# Patient Record
Sex: Female | Born: 1935 | Race: White | Hispanic: No | Marital: Single | Smoking: Former smoker
Health system: Southern US, Community
[De-identification: ages and names within clinical notes are randomized; demographics above are authoritative.]

## PROBLEM LIST (undated history)

## (undated) DIAGNOSIS — D649 Anemia, unspecified: Secondary | ICD-10-CM

## (undated) DIAGNOSIS — J90 Pleural effusion, not elsewhere classified: Secondary | ICD-10-CM

## (undated) DIAGNOSIS — Z8619 Personal history of other infectious and parasitic diseases: Secondary | ICD-10-CM

## (undated) DIAGNOSIS — I340 Nonrheumatic mitral (valve) insufficiency: Secondary | ICD-10-CM

## (undated) DIAGNOSIS — Z7901 Long term (current) use of anticoagulants: Secondary | ICD-10-CM

## (undated) DIAGNOSIS — I071 Rheumatic tricuspid insufficiency: Secondary | ICD-10-CM

## (undated) DIAGNOSIS — I639 Cerebral infarction, unspecified: Secondary | ICD-10-CM

## (undated) DIAGNOSIS — I4819 Other persistent atrial fibrillation: Secondary | ICD-10-CM

## (undated) DIAGNOSIS — I951 Orthostatic hypotension: Secondary | ICD-10-CM

## (undated) DIAGNOSIS — R296 Repeated falls: Secondary | ICD-10-CM

## (undated) DIAGNOSIS — Z66 Do not resuscitate: Secondary | ICD-10-CM

## (undated) DIAGNOSIS — Z8719 Personal history of other diseases of the digestive system: Secondary | ICD-10-CM

## (undated) DIAGNOSIS — I351 Nonrheumatic aortic (valve) insufficiency: Secondary | ICD-10-CM

## (undated) DIAGNOSIS — K529 Noninfective gastroenteritis and colitis, unspecified: Secondary | ICD-10-CM

## (undated) DIAGNOSIS — I495 Sick sinus syndrome: Secondary | ICD-10-CM

## (undated) DIAGNOSIS — I499 Cardiac arrhythmia, unspecified: Secondary | ICD-10-CM

## (undated) HISTORY — DX: Long term (current) use of anticoagulants: Z79.01

## (undated) HISTORY — PX: CHOLECYSTECTOMY: SHX55

## (undated) HISTORY — DX: Personal history of other infectious and parasitic diseases: Z86.19

## (undated) HISTORY — PX: APPENDECTOMY: SHX54

---

## 2000-05-28 ENCOUNTER — Inpatient Hospital Stay (HOSPITAL_COMMUNITY): Admission: EM | Admit: 2000-05-28 | Discharge: 2000-05-29 | Payer: Self-pay | Admitting: Emergency Medicine

## 2000-06-04 ENCOUNTER — Encounter: Admission: RE | Admit: 2000-06-04 | Discharge: 2000-06-04 | Payer: Self-pay | Admitting: Family Medicine

## 2000-08-31 ENCOUNTER — Other Ambulatory Visit: Admission: RE | Admit: 2000-08-31 | Discharge: 2000-08-31 | Payer: Self-pay | Admitting: Family Medicine

## 2000-09-01 ENCOUNTER — Inpatient Hospital Stay (HOSPITAL_COMMUNITY): Admission: EM | Admit: 2000-09-01 | Discharge: 2000-09-12 | Payer: Self-pay | Admitting: Emergency Medicine

## 2000-09-01 ENCOUNTER — Encounter: Payer: Self-pay | Admitting: Surgery

## 2000-09-01 ENCOUNTER — Encounter (INDEPENDENT_AMBULATORY_CARE_PROVIDER_SITE_OTHER): Payer: Self-pay | Admitting: Specialist

## 2000-09-07 ENCOUNTER — Encounter: Payer: Self-pay | Admitting: General Surgery

## 2004-12-14 ENCOUNTER — Ambulatory Visit: Payer: Self-pay | Admitting: Infectious Diseases

## 2004-12-14 ENCOUNTER — Inpatient Hospital Stay (HOSPITAL_COMMUNITY): Admission: EM | Admit: 2004-12-14 | Discharge: 2004-12-16 | Payer: Self-pay | Admitting: Emergency Medicine

## 2004-12-24 ENCOUNTER — Ambulatory Visit: Payer: Self-pay | Admitting: Internal Medicine

## 2005-01-30 ENCOUNTER — Ambulatory Visit: Payer: Self-pay | Admitting: Internal Medicine

## 2007-05-15 ENCOUNTER — Inpatient Hospital Stay (HOSPITAL_COMMUNITY): Admission: EM | Admit: 2007-05-15 | Discharge: 2007-05-17 | Payer: Self-pay | Admitting: Emergency Medicine

## 2010-04-10 ENCOUNTER — Encounter: Admission: RE | Admit: 2010-04-10 | Discharge: 2010-04-10 | Payer: Self-pay | Admitting: Orthopedic Surgery

## 2010-10-16 ENCOUNTER — Ambulatory Visit: Payer: Self-pay | Admitting: Cardiovascular Disease

## 2010-10-30 ENCOUNTER — Ambulatory Visit (HOSPITAL_COMMUNITY)
Admission: RE | Admit: 2010-10-30 | Discharge: 2010-10-30 | Payer: Self-pay | Source: Home / Self Care | Attending: Cardiovascular Disease | Admitting: Cardiovascular Disease

## 2010-10-30 ENCOUNTER — Ambulatory Visit: Payer: Self-pay

## 2010-10-30 ENCOUNTER — Encounter: Payer: Self-pay | Admitting: Cardiovascular Disease

## 2010-12-30 ENCOUNTER — Ambulatory Visit (INDEPENDENT_AMBULATORY_CARE_PROVIDER_SITE_OTHER): Payer: Medicare Other | Admitting: Cardiovascular Disease

## 2010-12-30 DIAGNOSIS — I4891 Unspecified atrial fibrillation: Secondary | ICD-10-CM

## 2011-02-06 ENCOUNTER — Other Ambulatory Visit: Payer: Self-pay | Admitting: *Deleted

## 2011-02-06 DIAGNOSIS — I4891 Unspecified atrial fibrillation: Secondary | ICD-10-CM

## 2011-02-06 MED ORDER — DIGOXIN 250 MCG PO TABS: ORAL_TABLET | ORAL | Status: DC

## 2011-02-06 NOTE — Telephone Encounter (Signed)
REFILL PER FAX FROM PHARMACY  

## 2011-04-01 NOTE — H&P (Signed)
NAMEJERNIE, SCHUTT                   ACCOUNT NO.:  [id number]   MEDICAL RECORD NO.:  [id number]          PATIENT TYPE:  INP   LOCATION:  6708                         FACILITY:  [facility]   PHYSICIAN:  Lonia Blood, M.D.       DATE OF BIRTH:  [birth year]   DATE OF ADMISSION:  05/15/2007  DATE OF DISCHARGE:                              HISTORY & PHYSICAL   PRIMARY CARE PHYSICIAN:  Windle Guard, M.D., [place].   CHIEF COMPLAINT:  Can't walk.   HISTORY OF PRESENT ILLNESS:  Mrs. Londo is an 77 year old woman with past  medical history of atrial fibrillation, who was brought to the emergency  room by her family after she had subacute onset of increased confusion,  dizziness, inability to walk that followed closely ingestion of 1 tablet  of 150 mg of amitriptyline.  The patient has been battling some  neuralgia caused by a zoster outbreak for the past 3 weeks.  Ms. Loflin  vertigo, denies any speech troubles, denies any focal weakness or  numbness.  The patient herself denies any confusion, but the family  assures me that she was thoroughly refused the day prior to admission.  The patient also reports some dizziness with change in position.  The  patient denies any chest pain or shortness of breath.   PAST MEDICAL HISTORY:  1. Atrial fibrillation on chronic Coumadin.  2. Appendectomy.  3. Hemicolectomy  4. Total hysterectomy and bilateral salpingo-oophorectomy.  5. Left ICA stenosis of 40-60%.  6. Hyperlipidemia.  7. Zoster for the past 3 weeks.   HOME MEDICATIONS:  1. Metoprolol 25 mg daily.  2. Digoxin 25 mcg daily.  3. Coumadin.   SOCIAL HISTORY:  The patient lives alone.  She is a former tobacco user  but quit about 3 years ago. She does not drink alcohol, does not use any  illicit drugs.  She is retired.   FAMILY HISTORY:  Positive coronary artery disease in father at an old  age.  Positive for stroke in her mother at an old age.  The patient has  two daughters present in the  room.  They are both healthy.   REVIEW OF SYSTEMS:  As per HPI.  All other systems are negative.   PHYSICAL EXAMINATION:  VITAL SIGNS:  Examination upon admission shows a  temperature of 96.3, blood pressure 111/70, pulse 108, respirations 20s  saturation 97% on room air.  GENERAL APPEARANCE:  A well-developed, well-nourished woman lying on the  stretcher, alert, oriented to place, person and time.  HEENT:  Head:  Normocephalic, atraumatic.  Eyes: Pupils equal, round,  and reactive to light and accommodation.  Extraocular movements intact.  Throat clear.  NECK: Supple.  No JVD or carotid bruits.  There is no facial droop.  CHEST:  Clear to auscultation bilaterally without wheezes, rhonchi, or  crackles.  HEART:  Irregularly irregular without murmurs, rubs or gallops.  ABDOMEN:  Soft, nontender and bowel sounds are present.  EXTREMITIES:  Lower Extremities have no edema.  SKIN: On the left side of the thorax in the T1-T3  distribution, there is  a maculopapular vesicular rash.  NEUROLOGIC:  The cerebellum seems to be intact with finger-to-nose and  heel-to-shin intact.  The patient does have a Romberg sign positive, and  she has significant ataxia when attempting to walk.  There is no  nystagmus.  There are no tremors.  Sensation and strength seem to be  intact in all four extremities.   LABORATORY VALUES:  At the time of admission show a sodium of 135, a  potassium of 4.60, chloride 103, BUN 27, glucose of 115. Hemoglobin 14.  Digoxin 0.5. Creatinine of 1.1. Urinalysis positive for nitrite.   Computed tomography scan of the head is without any acute changes.   EKG does not show QT prolongation.   ASSESSMENT/PLAN:  Ataxia.  Symptoms could well be attributed to her  amitriptyline ingestion, but given the presence of atrial fibrillation,  chronic anticoagulation, multiple risk factor for strokes, we would  proceed with ruling out an acute stroke by MRI of the brain.  If this   patient has an acute stroke, I would suspect a cerebellar location or a  brainstem location.  The patient will be placed on telemetry in a  neurological unit. She will receive the neurological checks and  monitoring for 24 hours.  The patient's metoprolol and digoxin for  atrial fibrillation rate control will be continued.  Physical therapy  and occupational therapy will be ordered.  Further plans in regard to  the patient's treatment will be dictated by the findings of the MRI of  the brain.  For now, we will discontinue amitriptyline, and the patient  has refused any alternatives for her post herpetic neuralgia.      Lonia Blood, M.D.  Electronically Signed     SL/MEDQ  D:  05/15/2007  T:  05/16/2007  Job:  [id number]   cc:   Windle Guard, M.D.

## 2011-04-01 NOTE — Discharge Summary (Signed)
Carol Snyder, Carol Snyder                   ACCOUNT NO.:  [id number]   MEDICAL RECORD NO.:  [id number]          PATIENT TYPE:  INP   LOCATION:  6708                         FACILITY:  [facility]   PHYSICIAN:  Ladell Pier, M.D.   DATE OF BIRTH:  April 18, 1929   DATE OF ADMISSION:  05/15/2007  DATE OF DISCHARGE:  05/17/2007                               DISCHARGE SUMMARY   DISCHARGE DIAGNOSIS:  1. Ataxia dizziness secondary to high dose of amitriptyline.  2. Atrial fibrillation on chronic Coumadin therapy.  3. Status post appendectomy.  4. Status post hemicolectomy  5. Total hysterectomy and TAH-BSO.  6. Left ICA stenosis of 40-60%.  7. Hyperlipidemia.  8. Zoster for the past 3 weeks.   DISCHARGE MEDICATIONS:  1. Metoprolol 25 mg daily.  2. Digoxin 50 mcg daily.  3. Coumadin as per directed.   PROCEDURES:  None.   CONSULTANTS:  None.   FOLLOW-UP APPOINTMENTS:  The patient to follow up with PCP in 1 week,  Dr. Windle Guard.   HISTORY OF PRESENT ILLNESS:  The patient is a 77 year old female with  past medical history significant for atrial fibrillation brought to the  emergency room by her family with onset of increased confusion and  dizziness inability to walk following ingestion of anywhere from 150-450  mg of amitriptyline.  The patient has been battling some postherpetic  neuralgia with her zoster outbreak.  She had no vertigo.  No speech  troubles.  No focal weakness.  She had no chest pain or shortness of  breath.   Past medical history, family history, social history, medications,  allergies, review of systems per admission H&P.   PHYSICAL EXAMINATION ON DISCHARGE:  VITAL SIGNS:  Temperature 97.7,  pulse of 87, respirations 20, blood pressure 106/54, pulse of 92% on  room air.  HEENT: Normocephalic, atraumatic.  Pupils reactive to light without  erythema.  CARDIOVASCULAR:  Regular.  LUNGS:  Clear bilaterally.  ABDOMEN: Positive bowel sounds without edema.  She has zoster on  her  upper chest on the left side   HOSPITAL COURSE:  #1 - DIZZINESS:  She was admitted to the hospital,  given IV fluids.  She had an MRI scan and cardiac enzymes done that did  not show any acute problems.  Cardiac enzymes were negative.  It was  determined that with her starting off taking 450 mg of 150-450 mg of  amitriptyline, it was the cause of her a problems.  That resolved on the  second day of admission when she was ambulated by physical therapy  without any problems.  Her dizziness resolved.   #2 - ATRIAL FIBRILLATION.  Continue her  metoprolol on her Coumadin   #3 - ZOSTER.  The patient states that she takes Tylenol.  Offered her  Neurontin or maybe Lyrica for her herpetic neuralgia, but she does not  want to try medications at this time.   DISCHARGE LABORATORIES:  Sodium 139 potassium , __________, CO2 28,  glucose 97, BUN 19, creatinine 1.08, calcium 8.9.  WBC 9.9, hemoglobin  of 9.1, platelets 227.  Had  mild anemia.   DISCHARGE DIAGNOSES:  To be followed by primary care physician.  PT  20.3, INR 2.5.  MRI of the brain showed minor chronic small-vessel  disease CT scan of the brain showed no acute changes.      Ladell Pier, M.D.  Electronically Signed     [place]/MEDQ  D:  05/17/2007  T:  05/18/2007  Job:  [id number]   cc:   Windle Guard, M.D.

## 2011-04-04 NOTE — Discharge Summary (Signed)
[place]. [facility]  Patient:    Carol Snyder, Carol Snyder                          MRN: [id number] Adm. Date:  [id number] Disc. Date: [id number] Attending:  Shelly Rubenstein CC:         Hadassah Pais. Jeannetta Nap, M.D.   Discharge Summary  HISTORY OF PRESENT ILLNESS:  The patient is a pleasant 77 year old female who presented to the emergency department, sent by Dr. Jeannetta Nap, after she had a three week history of intermittent abdominal pain which had moved to her right lower quadrant.  She had been treated for one week on oral antibiotics.  PHYSICAL EXAMINATION:  She was found to have an abdomen that was soft with mild right lower quadrant tenderness.  She also was accompanied with a CAT scan of the abdomen and pelvis which showed her to have a dilated appendix consistent with appendicitis and a large right ovarian cyst.  HOSPITAL COURSE:  The patient was admitted and taken to the operating room where she underwent a diagnostic laparoscopy and then a right hemicolectomy for severely perforated appendicitis and a possible colon mass with dephlegmon.  She also underwent a right oophorectomy by Dr. Elliot Gault.  She was taken postoperatively to a regular surgical floor with a nasogastric tube in place and IV antibiotics.  Over the next several days she was continued on bowel rest.  Her hemoglobin did drift postoperatively to 7.8 on postoperative day #2, and she was transferred 2 units of packed red blood cells which brought her hemoglobin back to 9.8 on postoperative day #3.  We removed her NG tube on postoperative day #2, as the drainage was minimal.  By postoperative day #4, she began passing a small amount of flatus and was started on liquids. At this point her white blood cell count decreased to 10.2 with a hemoglobin of 9.8.  Over the next several days her ileus slowly resolved.  She still had some fever secondary to atelectasis.  Her antibiotics were changed to Primaxin and  vancomycin, as well as Diflucan as she developed a midline wound infection.  A KUB done on September 07, 2000, was nonspecific.  Chest x-ray did show bilateral pulmonary effusions.  By September 08, 2000, her white count had decreased to 8.0.  She continued to have flatus and bowel movements, and was tolerating a regular diet.  She quickly defervesced after this.  Over the next several days she continued to do well and began ambulating well.  Home health arrangements were made for wound care.  By September 12, 2000, she was doing very well with wet-to-dry dressing changes on her abdomen, she was tolerating a regular diet, she was afebrile off antibiotics, and the decision was made to discharge the patient to home.  Prior to discharge her pathology report was reviewed to her, and there was no evidence of carcinoma, only a benign ovary and a perforated appendix.  DISCHARGE DIAGNOSES: 1. Perforated appendix, status post exploratory laparotomy with right    hemicolectomy. 2. Benign right ovarian cyst, status post oophorectomy. 3. Wound infection.  DISCHARGE DIET:  Regular.  DISCHARGE ACTIVITY:  As tolerated.  She is to do no heavy lifting. Home health is arranged for b.i.d. wet-to-dry dressing changes.  DISCHARGE MEDICATIONS:  Resume her medications.  She will take Vicodin for pain.  DISCHARGE FOLLOWUP:  She will follow up in my office one week post discharge. DD:  10/05/00  TD:  10/05/00 Job: [id number]

## 2011-04-04 NOTE — Op Note (Signed)
[facility]  Patient:    Carol Snyder, Carol Snyder                          MRN: [id number] Proc. Date: 09/01/00 Adm. Date:  [id number] Attending:  Abigail Miyamoto A CC:         Abigail Miyamoto, M.D. at [facility]   Operative Report  PREOPERATIVE DIAGNOSIS:  Right adnexal mass with an intraoperative consultation at the request of Dr. Abigail Miyamoto.  POSTOPERATIVE DIAGNOSIS:  Right adnexal mass with an intraoperative consultation at the request of Dr. Abigail Miyamoto.  OPERATION:  SURGEON:  Georgina Peer, M.D.  BRIEF HISTORY:  A 77 year old female with a right adnexal mass and possible appendicitis.  I was asked to evaluate intraoperatively a right adnexal mass which was freely mobile after removing adhesions of the bowel which covered it.  DESCRIPTION OF PROCEDURE:  A 4 cm simple-appearing cyst on the pole of the right ovary was noted.  There were no adhesions.  The left ovary was atrophic and it had varicosities at its base.  A bilateral salpingo-oophorectomy was planned.  The infundibulopelvic vessels on the right were isolated free from the ureter, doubly clamped, cut, and suture ligated.  Progressive bites of the medial ovarian were taken and clamped and suture ligated.  Small bleeders along the iliac vessels were controlled with surgical clips.  The left ovary was identified free from the ureter.  Infundibulopelvic vessels were isolated, doubly clamped, cut, and suture ligated.  Varicosities were isolated, clamped, cut, and suture ligated.  The ovary was then freed from its attachments with clamps and suture ligated.  There was excellent hemostasis.  Dr. Magnus Ivan then continued with the rest of the appendectomy and bowel surgery as described in a separate note. DD:  09/01/00 TD:  09/02/00 Job: [id number]

## 2011-04-04 NOTE — H&P (Signed)
[place]. [facility]  Patient:    Carol Snyder, Carol Snyder                          MRN: [id number] Adm. Date:  [id number] Attending:  Sanjuana Letters Dictator:   Raynelle Jan, M.D. CC:         Hadassah Pais. Jeannetta Nap, M.D.                         History and Physical  DATE OF BIRTH:  03/09/1929.  PRIMARY CARE PHYSICIAN:  Hadassah Pais. Jeannetta Nap, M.D.  CHIEF COMPLAINT:  Dizziness.  HISTORY OF PRESENT ILLNESS:  This is a 77 year old female with no significant past medical history, who presents with a two-day history of dizziness and lightheadedness that started yesterday p.m. while she was standing in the kitchen freezing corn.  She became sweaty and hot, which then progressed to dizziness to the point that she felt she would pass out.  This lasted several minutes in duration and then eventually eased once she rested.  She had an "identical spell" while going to the bathroom overnight with associated palpitations; however, she denies loss of consciousness, falling, shortness of breath, chest pain, nausea or vomiting during these episodes.  She notes no weakness or numbness in her extremities.  She notes no difficulty eating or speaking.  She was evaluated by her primary M.D. today who was concerned over a decreased blood pressure and irregular heart rate on exam and therefore sent her to the ED for further evaluation and management.  PAST MEDICAL HISTORY:  She had a lumpectomy in the 1970s and a partial hysterectomy with a bladder tack in the 1980s.  MEDICATIONS:  She is on no medicines.  ALLERGIES:  SULFA, which causes a rash.  SOCIAL HISTORY:  She is divorced and lives alone.  She smokes approximately a half a pack per day x 40 years and drinks occasional alcohol.  FAMILY HISTORY:  Her dad died of an MI at the age of 74.  Her mother had a CVA late in life.  She has a brother with hypertension.  REVIEW OF SYSTEMS:  Her review of systems was negative for fever,  chills or weight loss, rhinorrhea, sore throat, shortness of breath, cough, chest pain, dyspnea on exertion, abdominal pain, nausea, vomiting, constipation, diarrhea, melena, dysuria, hematuria, rash, polyuria or polydipsia.  The review of systems was significant for some low back pain yesterday that occurred while she was standing in her kitchen during the episode.  PHYSICAL EXAMINATION:  VITAL SIGNS:  In the ED, her temperature was 96.8, pulse 90, blood pressure was 88-112/49-57.  Orthostasis included a lying blood pressure of 118/47 with a pulse of 87, sitting was 109/45 with a pulse of 87, standing was 102/46 with a pulse of 93.  Respirations were 16, saturation 97% on room air.  GENERAL:  She was alert and oriented, in no apparent distress.  HEENT:  Her pupils are equal, round and reactive to light and extraocular movements intact.  Her oropharynx was without erythema or exudate.  NECK:  Supple without JVD or lymphadenopathy.  CARDIOVASCULAR:  She had a normal S1 and S2 with an irregular rhythm that varied with respirations.  LUNGS:  Clear to auscultation bilaterally.  ABDOMEN:  Soft, nontender, nondistended, with positive bowel sounds.  No masses.  RECTAL:  Heme-negative with no masses.  EXTREMITIES:  Her pulses were 2+, with  no cyanosis, clubbing or edema.  NEUROLOGIC:  Cranial nerves II-XII were intact.  Reflexes were 2+.  She had no deficits in movement.  ______ sensation in her extremities.  LABORATORY AND X-RAY FINDINGS:  She had a WBC of 10.6 and a hemoglobin of 12.3 and hematocrit of 35.1.  Platelets were 233,000.  Her absolute neutrophil count was 8.4.  INR was 0.9.  Her first set of cardiac enzymes included a CK of 47, MB fraction of 0.9, index of 1.9.  Her troponin was 0.07.  Basic metabolic panel included a sodium of 141, potassium of 4.3, chloride 101, bicarb 25, glucose elevated at 135, BUN 21, creatinine 0.9, calcium 9.7.  Chest x-ray showed no active  disease.  Abdominal ultrasound was performed which was negative for AAA.  Her EKG showed a sinus arrhythmia but no ST segment changes.  ASSESSMENT AND PLAN:  This is a 77 year old female with a presyncopal episode for which the differential includes cardiac versus orthostatic versus vasovagal versus neurological.  Given her history, the most likely is orthostatic or vasovagal; however, as her blood pressure is certainly on the low side of normal even for her and as she has an irregular rhythm on exam, we will admit her for rule out myocardial infarction and place her on telemetry to monitor for arrhythmias.  We will perform serial enzymes x 3 and check an electrocardiogram in the morning.  We will start intravenous fluids at 70 cc/hr for rehydration, given her low blood pressure.  We will repeat orthostatics in the morning. DD:  05/28/00 TD:  05/29/00 Job: [id number]

## 2011-04-04 NOTE — Discharge Summary (Signed)
[place]. [facility]  Patient:    Carol Snyder, Carol Snyder                          MRN: [id number] Adm. Date:  [id number] Disc. Date: [id number] Attending:  Abigail Miyamoto A                           Discharge Summary  HISTORY OF PRESENT ILLNESS:  The patient is a pleasant 77 year old female who presented to the emergency department with abdominal pain.  She has had abdominal pain for approximately three weeks, and it has been intermittent, and has now recently moved to the right lower quadrant.  She had been treated for one week with Cipro.  PHYSICAL EXAMINATION:  She was found to have an abdomen with mild tenderness in the right lower quadrant.  She also presented with a CAT scan of the abdomen and pelvis which showed her to have a dilated appendix consistent with appendicitis and a large right ovarian cyst versus abscess.  HOSPITAL COURSE:  The patient was admitted and taken to the operating room where she underwent a diagnostic laparoscopy and then a right hemicolectomy for severely perforated appendicitis and possible colon mass, and she also underwent a right oophorectomy for a right ovarian abscess. DD:  10/05/00 TD:  10/05/00 Job: [id number]

## 2011-04-04 NOTE — Op Note (Signed)
[facility]  Patient:    Carol Snyder, Carol Snyder                          MRN: [id number] Proc. Date: 09/01/00 Adm. Date:  [id number] Attending:  Abigail Miyamoto A                           Operative Report  PREOPERATIVE DIAGNOSIS:  Acute appendicitis.  POSTOPERATIVE DIAGNOSIS:  Perforated appendicitis with phlegmon and possible right colon mass.  PROCEDURE:  SURGEON:  Abigail Miyamoto, M.D.  ASSISTANT:  Gita Kudo, M.D.  ANESTHESIA:  General endotracheal.  INDICATIONS:  The patient is a 77 year old female who presented with abdominal pain.  She was found on CAT scan of the abdomen to have findings consistent with acute appendicitis and also what appeared to be a large right ovarian cyst.  Given the CAT scan findings, the decision was made to proceed to the operating room for exploration.  FINDINGS:  Under diagnostic laparoscopy, the patient was found to have a large phlegmon adhered to the abdominal wall on the right lower quadrant. Dissecting this out resulting in perforation of the colon.  Therefore, the procedure was changed to an exploratory laparotomy.  The patient was found to have a perforated distal appendix with also a large phlegmon with stricturing in the proximal right colon and a possible colon mass.  Pathology is still pending.  The patient was also found to have a large right ovarian cyst and underwent a bilateral oophorectomy by Dr. Elliot Gault.  DESCRIPTION OF PROCEDURE:  The patient was brought to the operating room and identified as Carol Snyder.  She was placed supine on the operating room table and then anesthesia was induced.  The abdomen was then prepped and draped in the usual sterile fashion.  Using a #15 blade, a vertical incision was made below the umbilicus.  The incision was carried down through the fascia with the scalpel.  The peritoneum was then opened with a hemostat.  A 0 Vicryl pursestring suture was then placed around  the fascial opening.  The [facility] port was then placed through the opening and insufflation of the abdomen was begun.  Upon entering the abdomen, visualization was done with the camera.  The patient was found to have a phlegmon in the right mid to lower quadrant.  A second 11 mm port was placed in the patients left lower quadrant after incision with the scalpel.  This was done under direct vision.  Dissection was then carried out.  I was unable to identify the ovaries at this point. Dissecting the phlegmon off the abdominal wall caused perforation of the cecum.  Given the inability to visualize the appendix, the procedure had to be changed to an exploratory laparotomy.  At this point, both ports were removed and the abdomen was deflated.  A midline incision was then created down to the pubis.  The incision was carried down through the fascia with the cautery.  The peritoneum was then opened the rest of the incision.  Upon entering the abdomen, the patient indeed was found to have a phlegmon in the right lower quadrant, and was found to have the perforated tip of her appendix, as well as a proximal stricture of this.  The cecum was found to be fairly friable as well.  Small bowel proximal to the terminal ileum was found to be tethered adherently into the pelvis into  the large right ovarian cyst.  Blunt and sharp dissection were used to bring the small bowel out of the pelvis, but again, an enterotomy was made in this area.  At this point, the decision was made to proceed with a right hemicolectomy given the friability of the cecum, and the proximal stricturing of the cecum, as well as the perforated appendix.  Prior to this, Dr. Elliot Gault was consulted who came in and performed and bilateral oophorectomy.  Once this was performed, the small bowel was transected with a GIA-75 stapler proximal to the terminal ileum and small bowel enterotomy.  The right colon was then mobilized along the  white line of Toldt and around the hepatic flexure.  The colon was then transected and the proximal transverse colon with the GIA-75 stapler.  The mesentery was then taken down with Kelly clamps and 2-0 Vicryl ties.  Next, a small bowel to colon anastomosis was performed by reapproximating the small bowel to the colon in a side-to-side fashion with interrupted Vicryl sutures.  Several enterotomies were made and a stapled anastomosis was performed with a GIA-75 stapler.  The open end was then closed with a TA60 stapler.  The mesenteric defect was then closed with interrupted 3-0 silk sutures.  At this point, the abdomen was copiously irrigated with normal saline. Hemostasis appeared to be achieved.  At this point, the midline fascia was closed with a running #1 PDS suture after the one port site was closed with a 0 Vicryl suture.  The skin was then irrigated and closed with skin staples.  The patient tolerated the procedure well.  All sponge, needle, and instrument counts were correct at the end of the procedure.  The patient was then extubated in the operating room and taken in stable condition to the recovery room. DD:  09/01/00 TD:  09/02/00 Job: [id number]

## 2011-04-04 NOTE — Discharge Summary (Signed)
Carol Snyder, Carol Snyder                   ACCOUNT NO.:  [id number]   MEDICAL RECORD NO.:  [id number]          PATIENT TYPE:  INP   LOCATION:  4705                         FACILITY:  [facility]   PHYSICIAN:  Lacretia Leigh. Hatcher, M.D.DATE OF BIRTH:  [birth year]   DATE OF ADMISSION:  12/14/2004  DATE OF DISCHARGE:  12/16/2004                                 DISCHARGE SUMMARY   DISCHARGE DIAGNOSES:  1.  Presyncope.  2.  Right hand numbness.  3.  Status post appendectomy.  4.  Status post right hemicolectomy.  5.  Status post right ovarian cyst excision on October 2001.  6.  Partial hysterectomy plus bladder tack in the 1980s.  7.  Bursitis of left hip.   DISCHARGE MEDICATIONS:  Aspirin 81 mg p.o. q.d.   DISPOSITION:  The patient is to go home with hospital followup at the  outpatient clinic on February 7, at 11:15 a.m. with Dr. Luiz Iron.   PROCEDURES:  1.  Computed tomography of the head on December 14, 2004, with no acute      intracranial abnormality.  Ethmoid sinus disease.  2.  Chest x-ray on January 28, with chronic obstructive pulmonary disease,      no active lung disease.  Apparent stable scarring in the right apex.  3.  On January 29, magnetic resonance imaging of the brain with mild chronic      small vessel ischemic changes.  No acute abnormality.  4.  On January 29, magnetic resonance angiogram of the brain which was      negative.  5.  On January 13, carotid Dopplers with no significant internal carotid      artery stenosis, 40-60% internal carotid artery stenosis (low end of      range).  Vertebral artery flow antegrade.  6.  On January 13, two-dimensional echocardiogram with results pending.   CONSULTATIONS:  None.   HISTORY OF PRESENT ILLNESS:  This is a 77 year old, white woman without  significant past medical history comes to the ED with history of flutter  sensation of her chest this morning at 8 a.m. when standing up from her bed.  This was accompanied with lightheadedness  plus sensation of going to pass  out.  Symptoms disappeared when lying down, but they reappear when standing  up.  No nausea, vomiting or diarrhea noted with no melena or hematemesis.  Sensation of shortness of breath when walking from her car to the ER just  once and not present during exam.  She also complains of right hand numbness  present when waking up this morning and still present at the interview.   PHYSICAL EXAMINATION:  VITAL SIGNS:  Orthostatics were negative.  NEUROLOGIC:  Negative.  RECTAL:  Guaiac was negative.   LABORATORY DATA AND X-RAY FINDINGS:  D-dimer less than 0.22.  Sodium 138,  potassium 4.1, chloride 109, bicarb 26.7, BUN 19, creatinine 1.0, glucose  95.  CBC with white blood cell count 7.2, hemoglobin 13.2, hematocrit 37.4,  platelets 250, ANC 5.1, MCV 87.2.   HOSPITAL COURSE:  Problem 1.  PRESYNCOPE:  The patient  was admitted to  telemetry bed.  During hospitalization, she did not have any event on  telemetry.  She remained in sinus rhythm.  She did not present during this  hospitalization any other episodes of lightheadedness or presyncope  symptoms.  EKG did not show any acute changes.  Cardiac enzymes were  negative.  TSH was within normal limits.  CBC remained stable.  Due to the  fact that the MRI did not show any acute changes like hemorrhage or stroke,  just mild chronic small vessel ischemic changes, we decided to put the  patient on aspirin and she is going to go home on that medication.   Problem 2.  RIGHT HAND NUMBNESS:  When the patient was admitted, she had  this right hand numbness during the interview in the ED, but these symptoms  disappeared around 4 hours after hospitalization and after that the patient  has been pain free of symptoms.  Neurological exam has remained within  normal limits with strength on both upper extremities 5/5, symmetrical and  the same with the rest of the extremities.  It was decided today, because  she is  asymptomatic and all labs and exams are normal, to send her home with  close follow up at the outpatient clinic.   DISCHARGE LABORATORY DATA AND X-RAY FINDINGS:  Sodium 137, potassium 3.9,  chloride 105, CO2 28, glucose 93, BUN 21, creatinine 0.8, calcium 8.9.  CBC  with white blood cell count 7.7, hemoglobin 11.3, hematocrit 32.9, MCV 87.1,  platelet count 261.  Lipid profile with cholesterol 220, triglycerides 173,  HDL 41, LDL 144.  TSH 1.379.      YC/MEDQ  D:  12/16/2004  T:  12/16/2004  Job:  [id number]   cc:   Outpatient Clinic

## 2011-05-16 ENCOUNTER — Telehealth: Payer: Self-pay | Admitting: Cardiovascular Disease

## 2011-05-16 MED ORDER — DILTIAZEM HCL 240 MG PO CP24: 240.0000 mg | ORAL_CAPSULE | Freq: Every day | ORAL | Status: DC

## 2011-05-16 NOTE — Telephone Encounter (Signed)
PT SAID HAS BEEN TRYING TO REFILL A MED AND BEING TOLD SHE NEEDED A  OV. PT SAID SHE WAS NOT AWARE AND SHE IS JUST ABOUT OUT AND GOING OUT OF TOWN AND IS SURE SHE CAN'T BE SEEN TODAY. SAYS SHE NEEDS THE MED AND CANNOT DO WITHOUT. PT DID NOT TELL ME THE NAME OF THE MED. SAID ITS AN EMERGENCY THAT JODETTE CALL HER BACK, HOPEFULLY WITHIN THE HOUR AND THAT SHE MAY JUST DROP IN TO EXPEDITE THIS. CHART IN BOX.

## 2011-05-16 NOTE — Telephone Encounter (Signed)
i have not had any requests for this pt, pt called and rx done.

## 2011-06-27 ENCOUNTER — Encounter: Payer: Self-pay | Admitting: *Deleted

## 2011-07-04 ENCOUNTER — Encounter: Payer: Self-pay | Admitting: Cardiovascular Disease

## 2011-07-04 ENCOUNTER — Ambulatory Visit (INDEPENDENT_AMBULATORY_CARE_PROVIDER_SITE_OTHER): Payer: Medicare Other | Admitting: Cardiovascular Disease

## 2011-07-04 VITALS — BP 124/70 | HR 72 | Ht 66.5 in | Wt 134.2 lb

## 2011-07-04 DIAGNOSIS — I4891 Unspecified atrial fibrillation: Secondary | ICD-10-CM

## 2011-07-04 DIAGNOSIS — I482 Chronic atrial fibrillation, unspecified: Secondary | ICD-10-CM | POA: Insufficient documentation

## 2011-07-04 NOTE — Progress Notes (Signed)
Carol Snyder Date of Birth  1929/09/30 [facility] [address]     [address] [place], [place]  [address] [phone]  Fax  [phone]  History of Present Illness:  77 year old with a history of atrial fibrillation. She is on chronic and cartilage. She complains of fatigue. She also complains of not sleeping well at night.  He denies any chest pain, shortness breath, syncope, or presyncope.  Current Outpatient Prescriptions on File Prior to Visit  Medication Sig Dispense Refill  . Acetaminophen (TYLENOL ARTHRITIS PAIN PO) Take by mouth as needed.        . Ascorbic Acid (VITAMIN C PO) Take 1 tablet by mouth daily.        . B Complex Vitamins (VITAMIN-B COMPLEX) TABS Take 1 tablet by mouth daily.        . cholecalciferol (VITAMIN D) 1000 UNITS tablet Take 1,000 Units by mouth daily.        . digoxin (LANOXIN) 0.25 MG tablet TAKE 1/2 TAB PO DAILY  30 tablet  11  . diltiazem (DILACOR XR) 240 MG 24 hr capsule Take 1 capsule (240 mg total) by mouth daily.  90 capsule  1  . metoprolol tartrate (LOPRESSOR) 25 MG tablet Take 12.5 mg by mouth 2 (two) times daily.        . Warfarin Sodium (COUMADIN PO) Take by mouth. As directed by the Coumadin Clinic       Warfarin is managed by Kaleen Mask, MD   Allergies  Allergen Reactions  . Aleve   . Sulfa Antibiotics     Past Medical History  Diagnosis Date  . Atrial fibrillation   . Long term current use of anticoagulant   . History of shingles   . Chest pain     Past Surgical History  Procedure Date  . Appendectomy   . Cholecystectomy     with partial colectomy do to gallbadder infection     History  Smoking status  . Former Smoker  . Quit date: 11/17/2004  Smokeless tobacco  . Not on file    History  Alcohol Use No    Family History  Problem Relation Age of Onset  . Heart attack Father   . Hypertension Father   . Stroke Mother   . Cancer Brother     lung  . Arrhythmia  Daughter     heart palpitations    Reviw of Systems:  Reviewed in the HPI.  All other systems are negative.  Physical Exam: BP 124/70  Pulse 72  Ht 5' 6.5" (1.689 m)  Wt 134 lb 3.2 oz (60.873 kg)  BMI 21.34 kg/m2 The patient is alert and oriented x 3.  The mood and affect are normal.   Skin: warm and dry.  Color is normal.    HEENT:   the sclera are nonicteric.  The mucous membranes are moist.  The carotids are 2+ without bruits.  There is no thyromegaly.  There is no JVD.    Lungs: clear.  The chest wall is non tender.    Heart: Irregular rate with a normal S1 and S2.  There are no murmurs, gallops, or rubs. The PMI is not displaced.     Abdomen: good bowel sounds.  There is no guarding or rebound.  There is no hepatosplenomegaly or tenderness.  There are no masses.   Extremities:  no clubbing, cyanosis, or edema.  The legs are without rashes.  The distal pulses  are intact.   Neuro:  Cranial nerves II - XII are intact.  Motor and sensory functions are intact.    The gait is normal.  ECG:  Assessment / Plan:

## 2011-07-04 NOTE — Assessment & Plan Note (Signed)
Her rate remains well controlled. She'll continue on her same medications.

## 2011-09-03 LAB — BASIC METABOLIC PANEL
BUN: 16
CO2: 28
Calcium: 8.9
Chloride: 105
Creatinine, Ser: 0.84
GFR calc Af Amer: 60 — ABNORMAL LOW
GFR calc non Af Amer: >60
Potassium: 4.1
Sodium: 139

## 2011-09-03 LAB — URINALYSIS, ROUTINE W REFLEX MICROSCOPIC
Bilirubin Urine: NEGATIVE
Glucose, UA: NEGATIVE
Hgb urine dipstick: NEGATIVE
Ketones, ur: NEGATIVE
Nitrite: NEGATIVE
Protein, ur: NEGATIVE
Specific Gravity, Urine: 1.015
Urobilinogen, UA: 0.2
pH: 5.5

## 2011-09-03 LAB — CBC
HCT: 32.9 — ABNORMAL LOW
HCT: 34 — ABNORMAL LOW
Hemoglobin: 11.1 — ABNORMAL LOW
Hemoglobin: 11.4 — ABNORMAL LOW
MCHC: 33.6
MCV: 85.4
MCV: 86.3
Platelets: 227
Platelets: 229
RBC: 3.94
RDW: 14.3 — ABNORMAL HIGH
RDW: 14.7 — ABNORMAL HIGH
WBC: 9
WBC: 9.9

## 2011-09-03 LAB — I-STAT 8, (EC8 V) (CONVERTED LAB)
Acid-Base Excess: 1
BUN: 27 — ABNORMAL HIGH
Bicarbonate: 26.7 — ABNORMAL HIGH
Chloride: 103
Glucose, Bld: 115 — ABNORMAL HIGH
HCT: 41
Hemoglobin: 13.9
Operator id: 257131
Potassium: 4.6
Sodium: 135
TCO2: 28
pCO2, Ven: 47.3
pH, Ven: 7.36 — ABNORMAL HIGH

## 2011-09-03 LAB — CARDIAC PANEL(CRET KIN+CKTOT+MB+TROPI)
CK, MB: 1.3
Relative Index: INVALID
Total CK: 99

## 2011-09-03 LAB — CK TOTAL AND CKMB (NOT AT ARMC)
CK, MB: 1.5
Relative Index: INVALID
Total CK: 98

## 2011-09-03 LAB — PROTIME-INR
Prothrombin Time: 34.3 — ABNORMAL HIGH
Prothrombin Time: 36.9 — ABNORMAL HIGH

## 2011-09-03 LAB — URINE MICROSCOPIC-ADD ON

## 2011-09-03 LAB — APTT

## 2011-09-03 LAB — DIGOXIN LEVEL: Digoxin Level: 0.5 — ABNORMAL LOW

## 2011-09-03 LAB — POCT I-STAT CREATININE
Creatinine, Ser: 1.1
Operator id: 257131

## 2011-09-03 LAB — TROPONIN I

## 2011-11-03 ENCOUNTER — Other Ambulatory Visit: Payer: Self-pay | Admitting: Cardiovascular Disease

## 2011-12-25 ENCOUNTER — Ambulatory Visit (INDEPENDENT_AMBULATORY_CARE_PROVIDER_SITE_OTHER): Payer: Medicare Other | Admitting: Cardiovascular Disease

## 2011-12-25 ENCOUNTER — Encounter: Payer: Self-pay | Admitting: Cardiovascular Disease

## 2011-12-25 VITALS — BP 125/80 | HR 79 | Ht 67.0 in | Wt 128.1 lb

## 2011-12-25 DIAGNOSIS — I4891 Unspecified atrial fibrillation: Secondary | ICD-10-CM

## 2011-12-25 NOTE — Patient Instructions (Signed)
Your physician wants you to follow-up in: 1 year or sooner if need.  You will receive a reminder letter in the mail two months in advance. If you don't receive a letter, please call our office to schedule the follow-up appointment.  

## 2011-12-25 NOTE — Assessment & Plan Note (Signed)
Her atrial fibrillation is well controlled. Her heart rate and blood pressure are normal. We'll continue with her same medications. She has her pro time checked at Dr. Hennie Duos office.  I'll see her again in one year for an office visit and EKG.

## 2011-12-25 NOTE — Progress Notes (Signed)
Carol Snyder Date of Birth  1929/08/02 [facility]     [place] Office  [address]    [address]   [address] [place], [place]  [address]    [place], [place]  [address] [phone]  Fax  [phone]  [phone]  Fax [phone]  Problem list: 1. Atrial fibrillation 2. Chronic anticoagulation 3. Shingles 4. History of cholecystectomy 5. Episodes of chest pain  History of Present Illness:  She has done fairly well.  She did have an episode of lightheadness and "hot " sensation several weeks ago.  She has been having problems with arthritis and is on prednisone  Current Outpatient Prescriptions on File Prior to Visit  Medication Sig Dispense Refill  . Acetaminophen (TYLENOL ARTHRITIS PAIN PO) Take by mouth as needed.        . Ascorbic Acid (VITAMIN C PO) Take 1 tablet by mouth daily.        . B Complex Vitamins (VITAMIN-B COMPLEX) TABS Take 1 tablet by mouth daily.        . cholecalciferol (VITAMIN D) 1000 UNITS tablet Take 1,000 Units by mouth daily.        . digoxin (LANOXIN) 0.25 MG tablet TAKE 1/2 TAB PO DAILY  30 tablet  11  . diltiazem (CARDIZEM CD) 240 MG 24 hr capsule TAKE 1 CAPSULE (240 MG TOTAL) BY MOUTH DAILY.  90 capsule  1  . metoprolol tartrate (LOPRESSOR) 25 MG tablet Take 12.5 mg by mouth 2 (two) times daily.        . Misc Natural Products (OSTEO BI-FLEX JOINT SHIELD PO) Take by mouth as needed.       . Warfarin Sodium (COUMADIN PO) Take by mouth. As directed by the Coumadin Clinic         Allergies  Allergen Reactions  . Aleve   . Sulfa Antibiotics     Past Medical History  Diagnosis Date  . Atrial fibrillation   . Long term current use of anticoagulant   . History of shingles   . Chest pain     Past Surgical History  Procedure Date  . Appendectomy   . Cholecystectomy     with partial colectomy do to gallbadder infection     History  Smoking status  . Former Smoker  . Quit date: 11/17/2004  Smokeless tobacco  . Not on  file    History  Alcohol Use No    Family History  Problem Relation Age of Onset  . Heart attack Father   . Hypertension Father   . Stroke Mother   . Cancer Brother     lung  . Arrhythmia Daughter     heart palpitations    Reviw of Systems:  Reviewed in the HPI.  All other systems are negative.  Physical Exam: Blood pressure 125/80, pulse 79, height 5\' 7"  (1.702 m), weight 128 lb 1.9 oz (58.115 kg). General: Well developed, well nourished, in no acute distress.  Head: Normocephalic, atraumatic, sclera non-icteric, mucus membranes are moist,   Neck: Supple. Negative for carotid bruits. JVD not elevated.  Lungs: Clear bilaterally to auscultation without wheezes, rales, or rhonchi. Breathing is unlabored.  Heart: Irregularly irregular with S1 S2. No murmurs, rubs, or gallops appreciated.  Abdomen: Soft, non-tender, non-distended with normoactive bowel sounds. No hepatomegaly. No rebound/guarding. No obvious abdominal masses.  Msk:  Strength and tone appear normal for age.  Extremities: No clubbing or cyanosis. No edema.  Distal pedal pulses are 2+  and equal bilaterally.  Neuro: Alert and oriented X 3. Moves all extremities spontaneously.  Psych:  Responds to questions appropriately with a normal affect.  ECG: Atrial fibrillation. She has nonspecific ST and T wave changes.  Assessment / Plan:

## 2012-01-14 ENCOUNTER — Other Ambulatory Visit: Payer: Self-pay | Admitting: *Deleted

## 2012-01-14 DIAGNOSIS — I4891 Unspecified atrial fibrillation: Secondary | ICD-10-CM

## 2012-01-14 MED ORDER — METOPROLOL TARTRATE 50 MG PO TABS: 25.0000 mg | ORAL_TABLET | Freq: Two times a day (BID) | ORAL | Status: DC

## 2012-01-14 MED ORDER — DIGOXIN 250 MCG PO TABS: ORAL_TABLET | ORAL | Status: DC

## 2012-05-10 ENCOUNTER — Other Ambulatory Visit: Payer: Self-pay | Admitting: *Deleted

## 2012-05-10 MED ORDER — DILTIAZEM HCL ER COATED BEADS 240 MG PO CP24: 240.0000 mg | ORAL_CAPSULE | Freq: Every day | ORAL | Status: DC

## 2012-05-10 NOTE — Telephone Encounter (Signed)
Refilled diltiazem 

## 2012-10-28 ENCOUNTER — Telehealth: Payer: Self-pay | Admitting: Cardiovascular Disease

## 2012-10-28 ENCOUNTER — Other Ambulatory Visit: Payer: Self-pay | Admitting: *Deleted

## 2012-10-28 MED ORDER — DILTIAZEM HCL ER COATED BEADS 240 MG PO CP24: 240.0000 mg | ORAL_CAPSULE | Freq: Every day | ORAL | Status: DC

## 2012-10-28 NOTE — Telephone Encounter (Signed)
Opened in Error.

## 2012-10-28 NOTE — Telephone Encounter (Signed)
Pt needs appointment then refill can be made Fax Received. Refill Completed. Carol Snyder (R.M.A)   

## 2012-10-28 NOTE — Telephone Encounter (Signed)
Pt needs diltiazem authorized at [facility] on [address] phone number [phone]

## 2012-12-17 ENCOUNTER — Encounter: Payer: Self-pay | Admitting: Cardiovascular Disease

## 2012-12-28 ENCOUNTER — Ambulatory Visit: Payer: Medicare Other | Admitting: Cardiovascular Disease

## 2013-01-20 ENCOUNTER — Encounter: Payer: Self-pay | Admitting: Cardiovascular Disease

## 2013-01-25 ENCOUNTER — Ambulatory Visit (INDEPENDENT_AMBULATORY_CARE_PROVIDER_SITE_OTHER): Payer: Medicare Other | Admitting: Cardiovascular Disease

## 2013-01-25 ENCOUNTER — Encounter: Payer: Self-pay | Admitting: Cardiovascular Disease

## 2013-01-25 VITALS — BP 122/74 | HR 79 | Ht 67.0 in | Wt 127.0 lb

## 2013-01-25 DIAGNOSIS — I4891 Unspecified atrial fibrillation: Secondary | ICD-10-CM

## 2013-01-25 NOTE — Progress Notes (Signed)
Edyn Popoca Vassell Date of Birth  July 17, 1929 [facility]     [place] Office  [address]    [address]   [address] [place], [place]  [address]    [place], [place]  [address] [phone]  Fax  [phone]  [phone]  Fax [phone]  Problem list: 1. Atrial fibrillation 2. Chronic anticoagulation 3. Shingles 4. History of cholecystectomy 5. Episodes of chest pain  History of Present Illness:  She has done fairly well.  She did have an episode of lightheadness and "hot " sensation several weeks ago.  She has been having problems with arthritis and is on prednisone  Current Outpatient Prescriptions on File Prior to Visit  Medication Sig Dispense Refill  . Acetaminophen (TYLENOL ARTHRITIS PAIN PO) Take by mouth as needed.        . Ascorbic Acid (VITAMIN C PO) Take 1 tablet by mouth daily.        . B Complex Vitamins (VITAMIN-B COMPLEX) TABS Take 1 tablet by mouth daily.        . cholecalciferol (VITAMIN D) 1000 UNITS tablet Take 1,000 Units by mouth daily.        . digoxin (LANOXIN) 0.25 MG tablet TAKE 1/2 TAB PO DAILY  90 tablet  3  . diltiazem (CARDIZEM CD) 240 MG 24 hr capsule Take 1 capsule (240 mg total) by mouth daily.  90 capsule  0  . metoprolol (LOPRESSOR) 50 MG tablet Take 0.5 tablets (25 mg total) by mouth 2 (two) times daily.  90 tablet  3  . Misc Natural Products (OSTEO BI-FLEX JOINT SHIELD PO) Take by mouth as needed.       . NON FORMULARY Taking Arthritis Pain Patches Daily      . Warfarin Sodium (COUMADIN PO) Take by mouth. As directed by the Coumadin Clinic        No current facility-administered medications on file prior to visit.    Allergies  Allergen Reactions  . Naproxen Sodium   . Sulfa Antibiotics     Past Medical History  Diagnosis Date  . Atrial fibrillation   . Long term current use of anticoagulant   . History of shingles   . Chest pain     Past Surgical History  Procedure Laterality Date  . Appendectomy    .  Cholecystectomy      with partial colectomy do to gallbadder infection     History  Smoking status  . Former Smoker  . Quit date: 11/17/2004  Smokeless tobacco  . Not on file    History  Alcohol Use No    Family History  Problem Relation Age of Onset  . Heart attack Father   . Hypertension Father   . Stroke Mother   . Cancer Brother     lung  . Arrhythmia Daughter     heart palpitations    Reviw of Systems:  Reviewed in the HPI.  All other systems are negative.  Physical Exam: Blood pressure 122/74, pulse 79, height 5\' 7"  (1.702 m), weight 127 lb (57.607 kg). General: Well developed, well nourished, in no acute distress.  Head: Normocephalic, atraumatic, sclera non-icteric, mucus membranes are moist,   Neck: Supple. Negative for carotid bruits. JVD not elevated.  Lungs: Clear bilaterally to auscultation without wheezes, rales, or rhonchi. Breathing is unlabored.  Heart: Irregularly irregular with S1 S2. No murmurs, rubs, or gallops appreciated.  Abdomen: Soft, non-tender, non-distended with normoactive bowel sounds. No hepatomegaly. No rebound/guarding. No  obvious abdominal masses.  Msk:  Strength and tone appear normal for age.  Extremities: No clubbing or cyanosis. No edema.  Distal pedal pulses are 2+ and equal bilaterally.  Neuro: Alert and oriented X 3. Moves all extremities spontaneously.  Psych:  Responds to questions appropriately with a normal affect.  ECG: January 25, 2013:  Atrial fibrillation at 61.  She has nonspecific ST and T wave changes.  Assessment / Plan:

## 2013-01-25 NOTE — Assessment & Plan Note (Signed)
Carol Snyder is  doing well. She has chronic atrial fibrillation is rate controlled. Her INR levels are managed  by Dr. Jeannetta Nap.  She is limited by her arthritis.

## 2013-01-25 NOTE — Patient Instructions (Addendum)
Your physician wants you to follow-up in: 1 YEAR WITH EKG  You will receive a reminder letter in the mail two months in advance. If you don't receive a letter, please call our office to schedule the follow-up appointment.   Your physician recommends that you continue on your current medications as directed. Please refer to the Current Medication list given to you today.  

## 2013-02-03 ENCOUNTER — Other Ambulatory Visit: Payer: Self-pay | Admitting: *Deleted

## 2013-02-03 MED ORDER — DILTIAZEM HCL ER COATED BEADS 240 MG PO CP24: 240.0000 mg | ORAL_CAPSULE | Freq: Every day | ORAL | Status: DC

## 2013-02-03 MED ORDER — METOPROLOL TARTRATE 50 MG PO TABS: 25.0000 mg | ORAL_TABLET | Freq: Two times a day (BID) | ORAL | Status: DC

## 2013-02-03 NOTE — Telephone Encounter (Signed)
Fax Received. Refill Completed. Carol Snyder (R.M.A)   

## 2013-03-30 ENCOUNTER — Telehealth: Payer: Self-pay | Admitting: Cardiovascular Disease

## 2013-03-30 NOTE — Telephone Encounter (Signed)
New problem    C/O Afib . Pulse this am  81. Irregular.

## 2013-03-30 NOTE — Telephone Encounter (Signed)
Pt called because she said that for the last week, she has notice that her heart is beating irregular, no other symptoms. Pt had the BP taken, 126/72  Heart rate  81 beats/minute irregular. Pt is aware that on the last office visit her heart rate was 79 beats/minute  A- fibrillation and Dr. Elease Hashimoto note states "Pt  is in A-fib and the rate is control". Pt is aware  to call the office if she has other symptoms with the irregular rhythm. Pt verbalized understanding.

## 2013-04-04 ENCOUNTER — Other Ambulatory Visit: Payer: Self-pay | Admitting: *Deleted

## 2013-04-04 NOTE — Telephone Encounter (Signed)
PHARMACY REQUESTING PT LOPRESSOR, CALLED PHARMACY TO VERIFY, MARY STATED THAT MEDICATION IS ON HOLD AND SHE WILL GO AHEAD AND FILL MED FOR PT.

## 2013-04-18 ENCOUNTER — Other Ambulatory Visit: Payer: Self-pay | Admitting: Emergency Medicine

## 2013-04-18 DIAGNOSIS — I4891 Unspecified atrial fibrillation: Secondary | ICD-10-CM

## 2013-04-18 MED ORDER — DIGOXIN 250 MCG PO TABS: ORAL_TABLET | ORAL | Status: DC

## 2014-01-13 ENCOUNTER — Telehealth: Payer: Self-pay | Admitting: Cardiovascular Disease

## 2014-01-13 NOTE — Telephone Encounter (Signed)
New Message  Pt called states that she will need a Prior Auth for the approval of the RX-- Digoxin per [facility]// Please assist

## 2014-01-23 ENCOUNTER — Telehealth: Payer: Self-pay | Admitting: *Deleted

## 2014-01-23 NOTE — Telephone Encounter (Signed)
PA to [facility] for digoxin 

## 2014-01-25 ENCOUNTER — Ambulatory Visit (INDEPENDENT_AMBULATORY_CARE_PROVIDER_SITE_OTHER): Payer: Medicare Other | Admitting: Cardiovascular Disease

## 2014-01-25 ENCOUNTER — Encounter: Payer: Self-pay | Admitting: Cardiovascular Disease

## 2014-01-25 VITALS — BP 118/70 | HR 72 | Ht 67.0 in | Wt 120.0 lb

## 2014-01-25 DIAGNOSIS — I4891 Unspecified atrial fibrillation: Secondary | ICD-10-CM

## 2014-01-25 NOTE — Telephone Encounter (Signed)
Approved and pharmacy notified  

## 2014-01-25 NOTE — Telephone Encounter (Signed)
Digox approved through [facility] through 01/24/2015, [place] [id number]

## 2014-01-25 NOTE — Assessment & Plan Note (Signed)
She is stable.  ECG is unchanged.   She had some palps but this is probably normal.  No changes in her medication.  Will see her in 1 year.

## 2014-01-25 NOTE — Patient Instructions (Signed)
Your physician wants you to follow-up in: 1 YEAR.  You will receive a reminder letter in the mail two months in advance. If you don't receive a letter, please call our office to schedule the follow-up appointment.  Your physician recommends that you continue on your current medications as directed. Please refer to the Current Medication list given to you today.  

## 2014-01-25 NOTE — Progress Notes (Signed)
Carol Snyder Date of Birth  August 31, 1929 [facility]     [place] Office  [address]    [address]   [address] [place], [place]  [address]    [place], [place]  [address] [phone]  Fax  [phone]  [phone]  Fax [phone]  Problem list: 1. Atrial fibrillation 2. Chronic anticoagulation 3. Shingles 4. History of cholecystectomy 5. Episodes of chest pain  History of Present Illness:  She has done fairly well.  She did have an episode of lightheadness and "hot " sensation several weeks ago.  She has been having problems with arthritis and is on prednisone  January 25, 2014:  She is doing well.   She had a hip replacement in Oct.   Current Outpatient Prescriptions on File Prior to Visit  Medication Sig Dispense Refill  . Acetaminophen (TYLENOL ARTHRITIS PAIN PO) Take by mouth as needed.        . Ascorbic Acid (VITAMIN C PO) Take 1 tablet by mouth daily.        . B Complex Vitamins (VITAMIN-B COMPLEX) TABS Take 1 tablet by mouth daily.        . cholecalciferol (VITAMIN D) 1000 UNITS tablet Take 1,000 Units by mouth daily.        . digoxin (LANOXIN) 0.25 MG tablet TAKE 1/2 TAB PO DAILY  90 tablet  3  . diltiazem (CARDIZEM CD) 240 MG 24 hr capsule Take 1 capsule (240 mg total) by mouth daily.  90 capsule  3  . metoprolol (LOPRESSOR) 50 MG tablet Take 0.5 tablets (25 mg total) by mouth 2 (two) times daily.  90 tablet  3  . Misc Natural Products (OSTEO BI-FLEX JOINT SHIELD PO) Take by mouth as needed.       . NON FORMULARY Taking Arthritis Pain Patches Daily      . Warfarin Sodium (COUMADIN PO) Take by mouth. As directed by the [facility]        No current facility-administered medications on file prior to visit.    Allergies  Allergen Reactions  . Naproxen Sodium   . Sulfa Antibiotics     Past Medical History  Diagnosis Date  . Atrial fibrillation   . Long term current use of anticoagulant   . History of shingles   . Chest pain      Past Surgical History  Procedure Laterality Date  . Appendectomy    . Cholecystectomy      with partial colectomy do to gallbadder infection     History  Smoking status  . Former Smoker  . Quit date: 11/17/2004  Smokeless tobacco  . Not on file    History  Alcohol Use No    Family History  Problem Relation Age of Onset  . Heart attack Father   . Hypertension Father   . Stroke Mother   . Cancer Brother     lung  . Arrhythmia Daughter     heart palpitations    Reviw of Systems:  Reviewed in the HPI.  All other systems are negative.  Physical Exam: Blood pressure 118/70, pulse 72, height 5\' 7"  (1.702 m), weight 120 lb (54.432 kg). General: Well developed, well nourished, in no acute distress.  Head: Normocephalic, atraumatic, sclera non-icteric, mucus membranes are moist,   Neck: Supple. Negative for carotid bruits. JVD not elevated.  Lungs: Clear bilaterally to auscultation without wheezes, rales, or rhonchi. Breathing is unlabored.  Heart: Irregularly irregular with S1 S2.  No murmurs, rubs, or gallops appreciated.  Abdomen: Soft, non-tender, non-distended with normoactive bowel sounds. No hepatomegaly. No rebound/guarding. No obvious abdominal masses.  Msk:  Strength and tone appear normal for age.  Extremities: No clubbing or cyanosis. No edema.  Distal pedal pulses are 2+ and equal bilaterally.  Neuro: Alert and oriented X 3. Moves all extremities spontaneously.  Psych:  Responds to questions appropriately with a normal affect.  ECG: January 25, 2014:  Atrial fibrillation at 74.  She has nonspecific ST and T wave changes.  Assessment / Plan:

## 2014-02-28 ENCOUNTER — Other Ambulatory Visit: Payer: Self-pay | Admitting: *Deleted

## 2014-02-28 MED ORDER — DILTIAZEM HCL ER COATED BEADS 240 MG PO CP24: 240.0000 mg | ORAL_CAPSULE | Freq: Every day | ORAL | Status: DC

## 2014-04-29 ENCOUNTER — Other Ambulatory Visit: Payer: Self-pay | Admitting: Cardiovascular Disease

## 2014-05-12 ENCOUNTER — Other Ambulatory Visit: Payer: Self-pay

## 2014-05-12 MED ORDER — DIGOXIN 250 MCG PO TABS: ORAL_TABLET | ORAL | Status: DC

## 2015-01-26 ENCOUNTER — Ambulatory Visit: Payer: Medicare Other | Admitting: Cardiovascular Disease

## 2016-06-04 HISTORY — PX: PACEMAKER IMPLANT: EP1218

## 2018-02-10 ENCOUNTER — Emergency Department (HOSPITAL_COMMUNITY): Payer: Medicare Other

## 2018-02-10 ENCOUNTER — Emergency Department (HOSPITAL_COMMUNITY)
Admission: EM | Admit: 2018-02-10 | Discharge: 2018-02-10 | Disposition: A | Discharge status: Home / Self Care | Payer: Medicare Other | Attending: Emergency Medicine | Admitting: Emergency Medicine

## 2018-02-10 ENCOUNTER — Encounter (HOSPITAL_COMMUNITY): Payer: Self-pay

## 2018-02-10 ENCOUNTER — Other Ambulatory Visit: Payer: Self-pay

## 2018-02-10 DIAGNOSIS — Y929 Unspecified place or not applicable: Secondary | ICD-10-CM | POA: Insufficient documentation

## 2018-02-10 DIAGNOSIS — S32000A Wedge compression fracture of unspecified lumbar vertebra, initial encounter for closed fracture: Secondary | ICD-10-CM

## 2018-02-10 DIAGNOSIS — Z79899 Other long term (current) drug therapy: Secondary | ICD-10-CM | POA: Diagnosis not present

## 2018-02-10 DIAGNOSIS — X509XXA Other and unspecified overexertion or strenuous movements or postures, initial encounter: Secondary | ICD-10-CM | POA: Diagnosis not present

## 2018-02-10 DIAGNOSIS — Z87891 Personal history of nicotine dependence: Secondary | ICD-10-CM | POA: Diagnosis not present

## 2018-02-10 DIAGNOSIS — Y999 Unspecified external cause status: Secondary | ICD-10-CM | POA: Insufficient documentation

## 2018-02-10 DIAGNOSIS — Z950 Presence of cardiac pacemaker: Secondary | ICD-10-CM | POA: Diagnosis not present

## 2018-02-10 DIAGNOSIS — S3992XA Unspecified injury of lower back, initial encounter: Secondary | ICD-10-CM | POA: Diagnosis present

## 2018-02-10 DIAGNOSIS — Y939 Activity, unspecified: Secondary | ICD-10-CM | POA: Insufficient documentation

## 2018-02-10 HISTORY — DX: Cerebral infarction, unspecified: I63.9

## 2018-02-10 MED ORDER — OXYCODONE-ACETAMINOPHEN 5-325 MG PO TABS
1.0000 | ORAL_TABLET | Freq: Once | ORAL | Status: AC
  Administered 2018-02-10: 1 via ORAL
  Filled 2018-02-10: qty 1

## 2018-02-10 NOTE — Discharge Instructions (Signed)
Please use lidocaine patches, tylenol, and cold therapy Call neurosurgery for follow up Return if new weakness

## 2018-02-10 NOTE — ED Triage Notes (Addendum)
patient complains of recurrent lower back pain x 2 weeks after lifting bags while cleaning cabinets. Seen at [facility] and not given muscle relaxers. Pain with any ROM

## 2018-02-10 NOTE — ED Provider Notes (Signed)
[facility] EMERGENCY [facility] Provider Note   CSN: [id number] Arrival date & time: 02/10/18  [id number]     History   Chief Complaint No chief complaint on file.   HPI Carol Snyder is a 84 y.o. female.  HPI  84 year old female with back pain.  Review of records reveal that she was seen and evaluated for back pain at [facility] on February 4.  At that time she had mid back pain after lifting.  Plain x-rays revealed endplate deformity of L3 vertebrae with mild superior endplate depression and could not rule out mild endplate compression fracture. Patient states that the pain improved somewhat but then worsened again approximately 1/2 weeks ago.  She has not had any new injury.  The pain is worse with movement.  Best position is sitting.  She is seen weekly by her primary care doctor for INR checks.  She reports no changes in her medications.  She has not mentioned this to her primary care doctor received any additional treatment.  She is taking Tylenol 500 mg up to every 3 hours while awake. Past Medical History:  Diagnosis Date  . Atrial fibrillation (HCC)   . Chest pain   . History of shingles   . Long term current use of anticoagulant   . Pacemaker   . Stroke [facility](HCC)     Patient Active Problem List   Diagnosis Date Noted  . A-fib (HCC) 07/04/2011    Past Surgical History:  Procedure Laterality Date  . APPENDECTOMY    . CHOLECYSTECTOMY     with partial colectomy do to gallbadder infection      OB History   None      Home Medications    Prior to Admission medications   Medication Sig Start Date End Date Taking? Authorizing Provider  Acetaminophen (TYLENOL ARTHRITIS PAIN PO) Take by mouth as needed.      [provider]  Ascorbic Acid (VITAMIN C PO) Take 1 tablet by mouth daily.      [provider]  B Complex Vitamins (VITAMIN-B COMPLEX) TABS Take 1 tablet by mouth daily.      [provider]    cholecalciferol (VITAMIN D) 1000 UNITS tablet Take 1,000 Units by mouth daily.      [provider]  digoxin (LANOXIN) 0.25 MG tablet TAKE 1/2 TAB PO DAILY 05/12/14   Nahser, Deloris PingPhilip J, MD  diltiazem (CARDIZEM CD) 240 MG 24 hr capsule Take 1 capsule (240 mg total) by mouth daily. 02/28/14   Nahser, Deloris PingPhilip J, MD  metoprolol (LOPRESSOR) 50 MG tablet TAKE 0.5 TABLETS (25 MG TOTAL) BY MOUTH 2 (TWO) TIMES DAILY.    Nahser, Deloris PingPhilip J, MD  Misc Natural Products (OSTEO BI-FLEX JOINT SHIELD PO) Take by mouth as needed.     [provider]  NON FORMULARY Taking Arthritis Pain Patches Daily    [provider]  Warfarin Sodium (COUMADIN PO) Take by mouth. As directed by the [facility]     [provider]    Family History Family History  Problem Relation Age of Onset  . Heart attack Father   . Hypertension Father   . Stroke Mother   . Cancer Brother        lung  . Arrhythmia Daughter        heart palpitations    Social History Social History   Tobacco Use  . Smoking status: Former Smoker    Last attempt to quit:  11/17/2004    Years since quitting: 13.2  . Smokeless tobacco: Never Used  Substance Use Topics  . Alcohol use: No  . Drug use: No     Allergies   Naproxen sodium and Sulfa antibiotics   Review of Systems Review of Systems   Physical Exam Updated Vital Signs BP (!) 144/92   Pulse 88   Temp 97.7 F (36.5 C)   Resp 16   SpO2 95%   Physical Exam  Constitutional: She is oriented to person, place, and time. She appears well-developed and well-nourished.  HENT:  Head: Normocephalic and atraumatic.  Right Ear: External ear normal.  Left Ear: External ear normal.  Eyes: Pupils are equal, round, and reactive to light. EOM are normal.  Neck: Normal range of motion.  Cardiovascular: Normal rate.  Pulmonary/Chest: Effort normal and breath sounds normal.  Abdominal: Soft. Bowel sounds are normal.  Musculoskeletal: Normal range of  motion.  No tenderness palpation of her thorax or lumbar spine.  No external signs of trauma or abnormality.  Neurological: She is alert and oriented to person, place, and time. She displays normal reflexes. No sensory deficit. She exhibits normal muscle tone. Coordination normal.  Skin: Skin is warm. Capillary refill takes less than 2 seconds.  Psychiatric: She has a normal mood and affect.  Nursing note and vitals reviewed.    ED Treatments / Results  Labs (all labs ordered are listed, but only abnormal results are displayed) Labs Reviewed - No data to display  EKG None  Radiology Ct Lumbar Spine Wo Contrast  Result Date: 02/10/2018 CLINICAL DATA:  Back pain for over 6 weeks, persistent despite conservative therapy EXAM: CT LUMBAR SPINE WITHOUT CONTRAST TECHNIQUE: Multidetector CT imaging of the lumbar spine was performed without intravenous contrast administration. Multiplanar CT image reconstructions were also generated. COMPARISON:  CTA of the abdomen 07/25/2016 FINDINGS: Segmentation: Rudimentary disc space at S1-2 based on the lowest ribs. Alignment: Normal Vertebrae: L1 body fracture with horizontal fracture cleft following the depressed superior endplate. Overall height loss is mild. Retropulsion is minimal. There is neighboring sclerosis and no visible neighboring soft tissue edema. These are features of subacute but incompletely healed fracture. Remote L4 superior endplate fracture with mild height loss. Osteopenia.  No evidence of discitis or bone lesion. Paraspinal and other soft tissues: Small right pleural effusion. Bilateral nephrolithiasis. Diffuse atherosclerotic calcification. Disc levels: Degenerative facet arthropathy with moderate spurring at L4-5 and L5-S1. Unremarkable disc spaces for age. IMPRESSION: 1. L1 compression fracture with subacute appearance. Height loss is mild. 2. L4 remote superior endplate fracture. 3. Small right pleural effusion that is chronic/recurrent  based on 2018 chest radiography. Electronically Signed   By: Marnee Spring M.D.   On: 02/10/2018 10:31    Procedures Procedures (including critical care time)  Medications Ordered in ED Medications - No data to display   Initial Impression / Assessment and Plan / ED Course  I have reviewed the triage vital signs and the nursing notes.  Pertinent labs & imaging results that were available during my care of the patient were reviewed by me and considered in my medical decision making (see chart for details).     Discussed results of CAT scan with patient and daughter.  Plan referral to neurosurgery.  She reports that she has seen orthopedic doctor in [facility].  I have advised that she can also go there.  Continue pain management with conservative therapy of topical therapy such as lidocaine patch, Tylenol, warm and cold  therapy.  We discussed return precautions and need for follow-up and they voiced understanding.  Final Clinical Impressions(s) / ED Diagnoses   Final diagnoses:  Lumbar compression fracture, closed, initial encounter [facility])    ED Discharge Orders    None       Margarita Grizzle, MD 02/10/18 1203

## 2018-02-10 NOTE — ED Notes (Signed)
Patient transported to CT 

## 2018-03-10 ENCOUNTER — Other Ambulatory Visit: Payer: Self-pay | Admitting: Family Medicine

## 2018-03-10 DIAGNOSIS — E2839 Other primary ovarian failure: Secondary | ICD-10-CM

## 2018-04-16 ENCOUNTER — Ambulatory Visit
Admission: RE | Admit: 2018-04-16 | Discharge: 2018-04-16 | Disposition: A | Discharge status: Home / Self Care | Payer: Medicare Other | Source: Ambulatory Visit | Attending: Family Medicine | Admitting: Family Medicine

## 2018-04-16 DIAGNOSIS — E2839 Other primary ovarian failure: Secondary | ICD-10-CM

## 2018-06-05 ENCOUNTER — Emergency Department (HOSPITAL_COMMUNITY): Payer: Medicare Other

## 2018-06-05 ENCOUNTER — Encounter (HOSPITAL_COMMUNITY): Payer: Self-pay | Admitting: Emergency Medicine

## 2018-06-05 ENCOUNTER — Inpatient Hospital Stay (HOSPITAL_COMMUNITY)
Admission: EM | Admit: 2018-06-05 | Discharge: 2018-06-09 | DRG: 682 | Disposition: A | Discharge status: Skilled Nursing Facility | Payer: Medicare Other | Attending: Family Medicine | Admitting: Family Medicine

## 2018-06-05 ENCOUNTER — Other Ambulatory Visit: Payer: Self-pay

## 2018-06-05 DIAGNOSIS — Z681 Body mass index (BMI) 19 or less, adult: Secondary | ICD-10-CM | POA: Diagnosis not present

## 2018-06-05 DIAGNOSIS — R296 Repeated falls: Secondary | ICD-10-CM | POA: Diagnosis present

## 2018-06-05 DIAGNOSIS — Z886 Allergy status to analgesic agent status: Secondary | ICD-10-CM | POA: Diagnosis not present

## 2018-06-05 DIAGNOSIS — N179 Acute kidney failure, unspecified: Secondary | ICD-10-CM | POA: Diagnosis not present

## 2018-06-05 DIAGNOSIS — R197 Diarrhea, unspecified: Secondary | ICD-10-CM | POA: Diagnosis present

## 2018-06-05 DIAGNOSIS — Z79899 Other long term (current) drug therapy: Secondary | ICD-10-CM | POA: Diagnosis not present

## 2018-06-05 DIAGNOSIS — I272 Pulmonary hypertension, unspecified: Secondary | ICD-10-CM | POA: Diagnosis not present

## 2018-06-05 DIAGNOSIS — Z7901 Long term (current) use of anticoagulants: Secondary | ICD-10-CM | POA: Diagnosis not present

## 2018-06-05 DIAGNOSIS — E43 Unspecified severe protein-calorie malnutrition: Secondary | ICD-10-CM | POA: Diagnosis not present

## 2018-06-05 DIAGNOSIS — W19XXXA Unspecified fall, initial encounter: Secondary | ICD-10-CM | POA: Diagnosis present

## 2018-06-05 DIAGNOSIS — Z882 Allergy status to sulfonamides status: Secondary | ICD-10-CM

## 2018-06-05 DIAGNOSIS — I083 Combined rheumatic disorders of mitral, aortic and tricuspid valves: Secondary | ICD-10-CM | POA: Diagnosis not present

## 2018-06-05 DIAGNOSIS — Z8673 Personal history of transient ischemic attack (TIA), and cerebral infarction without residual deficits: Secondary | ICD-10-CM | POA: Diagnosis not present

## 2018-06-05 DIAGNOSIS — Z9049 Acquired absence of other specified parts of digestive tract: Secondary | ICD-10-CM

## 2018-06-05 DIAGNOSIS — I482 Chronic atrial fibrillation, unspecified: Secondary | ICD-10-CM | POA: Diagnosis present

## 2018-06-05 DIAGNOSIS — S81811A Laceration without foreign body, right lower leg, initial encounter: Secondary | ICD-10-CM

## 2018-06-05 DIAGNOSIS — Z87891 Personal history of nicotine dependence: Secondary | ICD-10-CM

## 2018-06-05 DIAGNOSIS — Z950 Presence of cardiac pacemaker: Secondary | ICD-10-CM | POA: Diagnosis present

## 2018-06-05 DIAGNOSIS — Y92009 Unspecified place in unspecified non-institutional (private) residence as the place of occurrence of the external cause: Secondary | ICD-10-CM

## 2018-06-05 DIAGNOSIS — I10 Essential (primary) hypertension: Secondary | ICD-10-CM | POA: Diagnosis present

## 2018-06-05 DIAGNOSIS — R531 Weakness: Secondary | ICD-10-CM

## 2018-06-05 DIAGNOSIS — D509 Iron deficiency anemia, unspecified: Secondary | ICD-10-CM | POA: Diagnosis not present

## 2018-06-05 DIAGNOSIS — E860 Dehydration: Secondary | ICD-10-CM | POA: Diagnosis not present

## 2018-06-05 LAB — BASIC METABOLIC PANEL
Anion gap: 11 (ref 5–15)
BUN: 32 mg/dL — AB (ref 8–23)
CHLORIDE: 100 mmol/L (ref 98–111)
CO2: 26 mmol/L (ref 22–32)
Calcium: 9.5 mg/dL (ref 8.9–10.3)
Creatinine, Ser: 1.27 mg/dL — ABNORMAL HIGH (ref 0.44–1.00)
GFR calc Af Amer: 43 mL/min — ABNORMAL LOW (ref >60)
GFR calc non Af Amer: 37 mL/min — ABNORMAL LOW (ref >60)
GLUCOSE: 121 mg/dL — AB (ref 70–99)
POTASSIUM: 4.8 mmol/L (ref 3.5–5.1)
Sodium: 137 mmol/L (ref 135–145)

## 2018-06-05 LAB — HEPATIC FUNCTION PANEL
ALT: 20 U/L (ref 0–44)
AST: 25 U/L (ref 15–41)
Albumin: 3.8 g/dL (ref 3.5–5.0)
Alkaline Phosphatase: 90 U/L (ref 38–126)
Bilirubin, Direct: 0.2 mg/dL (ref 0.0–0.2)
Indirect Bilirubin: 0.3 mg/dL (ref 0.3–0.9)
Total Bilirubin: 0.5 mg/dL (ref 0.3–1.2)
Total Protein: 6.7 g/dL (ref 6.5–8.1)

## 2018-06-05 LAB — PROTIME-INR
INR: 2.81
Prothrombin Time: 29.4 seconds — ABNORMAL HIGH (ref 11.4–15.2)

## 2018-06-05 LAB — CBC
HEMATOCRIT: 40.5 % (ref 36.0–46.0)
HEMOGLOBIN: 12.5 g/dL (ref 12.0–15.0)
MCH: 29.6 pg (ref 26.0–34.0)
MCHC: 30.9 g/dL (ref 30.0–36.0)
MCV: 96 fL (ref 78.0–100.0)
Platelets: 186 10*3/uL (ref 150–400)
RBC: 4.22 MIL/uL (ref 3.87–5.11)
RDW: 14 % (ref 11.5–15.5)
WBC: 8.9 10*3/uL (ref 4.0–10.5)

## 2018-06-05 LAB — CBG MONITORING, ED: Glucose-Capillary: 112 mg/dL — ABNORMAL HIGH (ref 70–99)

## 2018-06-05 MED ORDER — TETANUS-DIPHTH-ACELL PERTUSSIS 5-2.5-18.5 LF-MCG/0.5 IM SUSP: 0.5000 mL | Freq: Once | INTRAMUSCULAR | Status: DC

## 2018-06-05 MED ORDER — ONDANSETRON HCL 4 MG/2ML IJ SOLN
4.0000 mg | Freq: Once | INTRAMUSCULAR | Status: AC
  Administered 2018-06-05: 4 mg via INTRAVENOUS
  Filled 2018-06-05: qty 2

## 2018-06-05 MED ORDER — SODIUM CHLORIDE 0.9 % IV BOLUS
500.0000 mL | Freq: Once | INTRAVENOUS | Status: AC
  Administered 2018-06-05: 500 mL via INTRAVENOUS

## 2018-06-05 NOTE — ED Provider Notes (Signed)
[facility] EMERGENCY DEPARTMENT Provider Note   CSN: [id number] Arrival date & time: 06/05/18  1922     History   Chief Complaint Chief Complaint  Patient presents with  . Fall  . Weakness    HPI Carol Snyder is a 84 y.o. female.  The history is provided by the patient and a relative.  Fall  This is a new problem. The current episode started less than 1 hour ago. The problem occurs constantly. The problem has not changed since onset.Nothing aggravates the symptoms. She has tried nothing for the symptoms.  Weakness   Pt reports she was going to the bathroom and she felt like her leg was going out and she fell.  Pt complain so a cut on her lower leg.  Pt's daughter reports pt has had increasing weakness over the last month.  She reports pt has fallen 3 times in the past month. Pt lives at home.  Pt's daughter has been staying with her. Pt reports she has had approx 30 pounds of weight loss in the last 6 months.  Pt reports she eats but not not drink very much fluid.    Past Medical History:  Diagnosis Date  . Atrial fibrillation (HCC)   . Chest pain   . History of shingles   . Long term current use of anticoagulant   . Pacemaker   . Stroke [facility])     Patient Active Problem List   Diagnosis Date Noted  . A-fib (HCC) 07/04/2011    Past Surgical History:  Procedure Laterality Date  . APPENDECTOMY    . CHOLECYSTECTOMY     with partial colectomy do to gallbadder infection      OB History   None      Home Medications    Prior to Admission medications   Medication Sig Start Date End Date Taking? Authorizing Provider  ACIDOPHILUS LACTOBACILLUS PO Take 1 tablet by mouth daily.    [provider]  amiodarone (PACERONE) 100 MG tablet Take 100 mg by mouth daily.    [provider]  diltiazem (TIAZAC) 180 MG 24 hr capsule Take 180 mg by mouth daily.    [provider]  furosemide (LASIX) 40 MG tablet Take 40 mg by mouth.     [provider]  loperamide (IMODIUM) 2 MG capsule Take 2 mg by mouth 2 (two) times daily.    [provider]  metoprolol tartrate (LOPRESSOR) 100 MG tablet Take 100 mg by mouth 2 (two) times daily.    [provider]  vitamin B-12 (CYANOCOBALAMIN) 1000 MCG tablet Take 1,000 mcg by mouth daily.    [provider]  warfarin (COUMADIN) 2.5 MG tablet Take 2.5 mg by mouth daily.    [provider]    Family History Family History  Problem Relation Age of Onset  . Heart attack Father   . Hypertension Father   . Stroke Mother   . Cancer Brother        lung  . Arrhythmia Daughter        heart palpitations    Social History Social History   Tobacco Use  . Smoking status: Former Smoker    Last attempt to quit: 11/17/2004    Years since quitting: 13.5  . Smokeless tobacco: Never Used  Substance Use Topics  . Alcohol use: No  . Drug use: No     Allergies   Naproxen sodium and Sulfa antibiotics   Review of Systems  Review of Systems  Neurological: Positive for weakness.  All other systems reviewed and are negative.    Physical Exam Updated Vital Signs BP 122/87 (BP Location: Right Arm)   Pulse 76   Temp 97.8 F (36.6 C) (Oral)   Resp 14   Ht 5\' 6"  (1.676 m)   Wt 49.9 kg (110 lb)   SpO2 98%   BMI 17.75 kg/m   Physical Exam  Constitutional: She appears well-developed and well-nourished.  HENT:  Head: Normocephalic.  Right Ear: External ear normal.  Eyes: Pupils are equal, round, and reactive to light.  Neck: Normal range of motion.  Cardiovascular: Normal rate.  Pulmonary/Chest: Effort normal.  Abdominal: Soft.  Musculoskeletal: Normal range of motion.  Neurological: She is alert.  Skin: Skin is warm.  3cm skin tear right lower leg, bruising right lower leg,    Psychiatric: She has a normal mood and affect.  Nursing note and vitals reviewed.    ED Treatments / Results  Labs (all labs ordered are listed, but only  abnormal results are displayed) Labs Reviewed  BASIC METABOLIC PANEL - Abnormal; Notable for the following components:      Result Value   Glucose, Bld 121 (*)    BUN 32 (*)    Creatinine, Ser 1.27 (*)    GFR calc non Af Amer 37 (*)    GFR calc Af Amer 43 (*)    All other components within normal limits  CBG MONITORING, ED - Abnormal; Notable for the following components:   Glucose-Capillary 112 (*)    All other components within normal limits  CBC  HEPATIC FUNCTION PANEL  URINALYSIS, ROUTINE W REFLEX MICROSCOPIC    EKG None  Radiology Dg Tibia/fibula Right  Result Date: 06/05/2018 CLINICAL DATA:  Fall today. Right leg injury and pain. Initial encounter. EXAM: RIGHT TIBIA AND FIBULA - 2 VIEW COMPARISON:  None. FINDINGS: There is no evidence of fracture or other focal bone lesions. Generalized osteopenia noted. Peripheral vascular calcification also seen. IMPRESSION: No evidence of fracture. Osteopenia. Electronically Signed   By: Myles RosenthalJohn  Stahl M.D.   On: 06/05/2018 20:52    Procedures Procedures (including critical care time)  Medications Ordered in ED Medications  Tdap (BOOSTRIX) injection 0.5 mL (has no administration in time range)     Initial Impression / Assessment and Plan / ED Course  I have reviewed the triage vital signs and the nursing notes.  Pertinent labs & imaging results that were available during my care of the patient were reviewed by me and considered in my medical decision making (see chart for details).  Clinical Course as of Jun 06 1313  Sun Jun 06, 2018  0143 Discussed with Dr. Katrinka BlazingSmith who will admit   [HM]    Clinical Course User Index [HM] Muthersbaugh, Boyd KerbsHannah, PA-C    MDM  Skin tear does not require sutures.  Rn will bandage.  Xray shows no evidence of fracture.   Pt is on coumadin.  She has had atrial fibrillation in the past.  Pt has had 3 recent falls, increasing weakness and weight loss.  Ct head, Chest xray and labs pending.   Final  Clinical Impressions(s) / ED Diagnoses   Final diagnoses:  Laceration of right lower leg, initial encounter  Fall, initial encounter  Generalized weakness    ED Discharge Orders    None    Pt's care turned over to [facility] Mutherbasugh PAC   Elson AreasSofia, Leslie K, [place] JerseyPA-C 06/06/18 1315    Pfeiffer,  Lebron Conners, MD 06/19/18 [id number]

## 2018-06-05 NOTE — ED Triage Notes (Addendum)
Per EMS, pt from home. Pt had a fall today after walking from dinner table to restroom when she felt lightheaded and dizzy, pt caught herself on dresser but hit her rt shin. Pt takes blood thinners. Pt denies hitting head. Pt has bruises from previous falls over the last 2 weeks. Pt noticed she has been dizzy and weak upon positional changes. Pt denies pain. A&Ox4, baseline ambulatory with walker. Pt has a pacemaker.

## 2018-06-06 ENCOUNTER — Encounter (HOSPITAL_COMMUNITY): Payer: Self-pay

## 2018-06-06 DIAGNOSIS — Z950 Presence of cardiac pacemaker: Secondary | ICD-10-CM | POA: Diagnosis present

## 2018-06-06 DIAGNOSIS — W19XXXA Unspecified fall, initial encounter: Secondary | ICD-10-CM | POA: Diagnosis not present

## 2018-06-06 DIAGNOSIS — N179 Acute kidney failure, unspecified: Secondary | ICD-10-CM | POA: Diagnosis present

## 2018-06-06 DIAGNOSIS — R197 Diarrhea, unspecified: Secondary | ICD-10-CM | POA: Diagnosis present

## 2018-06-06 DIAGNOSIS — Z7901 Long term (current) use of anticoagulants: Secondary | ICD-10-CM

## 2018-06-06 DIAGNOSIS — I482 Chronic atrial fibrillation: Secondary | ICD-10-CM | POA: Diagnosis not present

## 2018-06-06 DIAGNOSIS — S81811A Laceration without foreign body, right lower leg, initial encounter: Secondary | ICD-10-CM | POA: Diagnosis not present

## 2018-06-06 LAB — TROPONIN I
Troponin I: <0.03 ng/mL (ref <0.03)
Troponin I: <0.03 ng/mL (ref <0.03)
Troponin I: <0.03 ng/mL (ref <0.03)

## 2018-06-06 LAB — URINALYSIS, ROUTINE W REFLEX MICROSCOPIC
BILIRUBIN URINE: NEGATIVE
Glucose, UA: NEGATIVE mg/dL
Ketones, ur: NEGATIVE mg/dL
LEUKOCYTES UA: NEGATIVE
NITRITE: NEGATIVE
Protein, ur: NEGATIVE mg/dL
RBC / HPF: >50 RBC/hpf — ABNORMAL HIGH (ref 0–5)
Specific Gravity, Urine: 1.015 (ref 1.005–1.030)
pH: 5 (ref 5.0–8.0)

## 2018-06-06 LAB — CBC
HCT: 34.2 % — ABNORMAL LOW (ref 36.0–46.0)
Hemoglobin: 10.6 g/dL — ABNORMAL LOW (ref 12.0–15.0)
MCH: 29.5 pg (ref 26.0–34.0)
MCHC: 31 g/dL (ref 30.0–36.0)
MCV: 95.3 fL (ref 78.0–100.0)
PLATELETS: 169 10*3/uL (ref 150–400)
RBC: 3.59 MIL/uL — ABNORMAL LOW (ref 3.87–5.11)
RDW: 13.9 % (ref 11.5–15.5)
WBC: 7.7 10*3/uL (ref 4.0–10.5)

## 2018-06-06 LAB — BASIC METABOLIC PANEL
Anion gap: 9 (ref 5–15)
BUN: 27 mg/dL — AB (ref 8–23)
CALCIUM: 8.8 mg/dL — AB (ref 8.9–10.3)
CO2: 25 mmol/L (ref 22–32)
CREATININE: 0.96 mg/dL (ref 0.44–1.00)
Chloride: 105 mmol/L (ref 98–111)
GFR calc Af Amer: >60 mL/min (ref >60)
GFR, EST NON AFRICAN AMERICAN: 52 mL/min — AB (ref >60)
GLUCOSE: 99 mg/dL (ref 70–99)
Potassium: 4.6 mmol/L (ref 3.5–5.1)
Sodium: 139 mmol/L (ref 135–145)

## 2018-06-06 LAB — PROTIME-INR
INR: 2.66
Prothrombin Time: 28.1 seconds — ABNORMAL HIGH (ref 11.4–15.2)

## 2018-06-06 MED ORDER — SODIUM CHLORIDE 0.9% FLUSH
3.0000 mL | Freq: Two times a day (BID) | INTRAVENOUS | Status: DC
  Administered 2018-06-06 – 2018-06-09 (×5): 3 mL via INTRAVENOUS

## 2018-06-06 MED ORDER — ALBUTEROL SULFATE (2.5 MG/3ML) 0.083% IN NEBU: 2.5000 mg | INHALATION_SOLUTION | Freq: Four times a day (QID) | RESPIRATORY_TRACT | Status: DC | PRN

## 2018-06-06 MED ORDER — ACETAMINOPHEN 650 MG RE SUPP: 650.0000 mg | Freq: Four times a day (QID) | RECTAL | Status: DC | PRN

## 2018-06-06 MED ORDER — ONDANSETRON HCL 4 MG PO TABS: 4.0000 mg | ORAL_TABLET | Freq: Four times a day (QID) | ORAL | Status: DC | PRN

## 2018-06-06 MED ORDER — METOPROLOL TARTRATE 25 MG PO TABS
100.0000 mg | ORAL_TABLET | Freq: Two times a day (BID) | ORAL | Status: DC
  Administered 2018-06-06 – 2018-06-09 (×7): 100 mg via ORAL
  Filled 2018-06-06 (×7): qty 4

## 2018-06-06 MED ORDER — WARFARIN SODIUM 2.5 MG PO TABS
2.5000 mg | ORAL_TABLET | Freq: Once | ORAL | Status: AC
  Administered 2018-06-06: 2.5 mg via ORAL
  Filled 2018-06-06: qty 1

## 2018-06-06 MED ORDER — DILTIAZEM HCL ER COATED BEADS 180 MG PO CP24
180.0000 mg | ORAL_CAPSULE | Freq: Every day | ORAL | Status: DC
  Administered 2018-06-06 – 2018-06-09 (×4): 180 mg via ORAL
  Filled 2018-06-06 (×4): qty 1

## 2018-06-06 MED ORDER — AMIODARONE HCL 200 MG PO TABS
100.0000 mg | ORAL_TABLET | Freq: Every day | ORAL | Status: DC
  Administered 2018-06-06 – 2018-06-09 (×4): 100 mg via ORAL
  Filled 2018-06-06 (×4): qty 1

## 2018-06-06 MED ORDER — ACETAMINOPHEN 325 MG PO TABS: 650.0000 mg | ORAL_TABLET | Freq: Four times a day (QID) | ORAL | Status: DC | PRN

## 2018-06-06 MED ORDER — SODIUM CHLORIDE 0.9 % IV SOLN
INTRAVENOUS | Status: DC
  Administered 2018-06-06: 04:00:00 via INTRAVENOUS

## 2018-06-06 MED ORDER — HYPROMELLOSE (GONIOSCOPIC) 2.5 % OP SOLN: 1.0000 [drp] | Freq: Three times a day (TID) | OPHTHALMIC | Status: DC | PRN

## 2018-06-06 MED ORDER — WARFARIN - PHARMACIST DOSING INPATIENT: Freq: Every day | Status: DC

## 2018-06-06 MED ORDER — ENSURE ENLIVE PO LIQD
237.0000 mL | Freq: Two times a day (BID) | ORAL | Status: DC
  Administered 2018-06-06 – 2018-06-09 (×7): 237 mL via ORAL

## 2018-06-06 MED ORDER — ONDANSETRON HCL 4 MG/2ML IJ SOLN: 4.0000 mg | Freq: Four times a day (QID) | INTRAMUSCULAR | Status: DC | PRN

## 2018-06-06 MED ORDER — SODIUM CHLORIDE 0.9 % IV SOLN
INTRAVENOUS | Status: DC
  Administered 2018-06-06: 13:00:00 via INTRAVENOUS

## 2018-06-06 MED ORDER — WHITE PETROLATUM EX OINT
TOPICAL_OINTMENT | CUTANEOUS | Status: AC
  Administered 2018-06-07
  Filled 2018-06-06: qty 28.35

## 2018-06-06 MED ORDER — WHITE PETROLATUM EX OINT
TOPICAL_OINTMENT | CUTANEOUS | Status: AC
  Administered 2018-06-06: 10:00:00
  Filled 2018-06-06: qty 28.35

## 2018-06-06 MED ORDER — ALBUTEROL SULFATE (2.5 MG/3ML) 0.083% IN NEBU: 2.5000 mg | INHALATION_SOLUTION | RESPIRATORY_TRACT | Status: DC | PRN

## 2018-06-06 MED ORDER — TRAMADOL HCL 50 MG PO TABS
50.0000 mg | ORAL_TABLET | Freq: Four times a day (QID) | ORAL | Status: DC | PRN
  Administered 2018-06-06 – 2018-06-07 (×3): 50 mg via ORAL
  Filled 2018-06-06 (×3): qty 1

## 2018-06-06 MED ORDER — DICYCLOMINE HCL 20 MG PO TABS
20.0000 mg | ORAL_TABLET | Freq: Two times a day (BID) | ORAL | Status: DC
  Administered 2018-06-06 – 2018-06-07 (×3): 20 mg via ORAL
  Filled 2018-06-06 (×4): qty 1

## 2018-06-06 NOTE — ED Notes (Signed)
  Attempted to interrogate pacemaker but our devices would not recognize the [employer] brand.  We attempted with [employer] and [employer] with no success.  Notified admitting MD

## 2018-06-06 NOTE — Progress Notes (Signed)
Communicated to NT pt needs stool and urine sample collection, verbalized understanding

## 2018-06-06 NOTE — Progress Notes (Signed)
ANTICOAGULATION CONSULT NOTE - Initial Consult  Pharmacy Consult for Coumadin Indication: atrial fibrillation  Allergies  Allergen Reactions  . Naproxen Sodium   . Sulfa Antibiotics     Patient Measurements: Height: 5\' 6"  (167.6 cm) Weight: 110 lb (49.9 kg) IBW/kg (Calculated) : 59.3  Vital Signs: Temp: 97.8 F (36.6 C) (07/20 1931) Temp Source: Oral (07/20 1931) BP: 130/66 (07/21 0000) Pulse Rate: 73 (07/21 0000)  Labs: Recent Labs    06/05/18 1958 06/05/18 2145  HGB 12.5  --   HCT 40.5  --   PLT 186  --   LABPROT  --  29.4*  INR  --  2.81  CREATININE 1.27*  --     Estimated Creatinine Clearance: 25 mL/min (A) (by C-G formula based on SCr of 1.27 mg/dL (H)).   Medical History: Past Medical History:  Diagnosis Date  . Atrial fibrillation (HCC)   . Chest pain   . History of shingles   . Long term current use of anticoagulant   . Pacemaker   . Stroke [facility](HCC)     Medications:  No current facility-administered medications on file prior to encounter.    Current Outpatient Medications on File Prior to Encounter  Medication Sig Dispense Refill  . amiodarone (PACERONE) 100 MG tablet Take 100 mg by mouth daily.    Marland Kitchen. dicyclomine (BENTYL) 20 MG tablet Take 20 mg by mouth 2 (two) times daily.    Marland Kitchen. diltiazem (TIAZAC) 180 MG 24 hr capsule Take 180 mg by mouth daily.    . furosemide (LASIX) 40 MG tablet Take 20 mg by mouth 3 (three) times a week. Monday, Wednesday and Friday    . hydroxypropyl methylcellulose / hypromellose (ISOPTO TEARS / GONIOVISC) 2.5 % ophthalmic solution Place 1 drop into both eyes 3 (three) times daily as needed for dry eyes.    . metoprolol tartrate (LOPRESSOR) 100 MG tablet Take 100 mg by mouth 2 (two) times daily.    Marland Kitchen. warfarin (COUMADIN) 5 MG tablet Take 2.5-5 mg by mouth See admin instructions. Take 2.5 mg for three days then a 5 mg tablet. Repeat    . [DISCONTINUED] ACIDOPHILUS LACTOBACILLUS PO Take 1 tablet by mouth daily.    .  [DISCONTINUED] loperamide (IMODIUM) 2 MG capsule Take 2 mg by mouth 2 (two) times daily.    . [DISCONTINUED] vitamin B-12 (CYANOCOBALAMIN) 1000 MCG tablet Take 1,000 mcg by mouth daily.    . [DISCONTINUED] warfarin (COUMADIN) 2.5 MG tablet Take 2.5 mg by mouth daily.      Assessment: 84 y.o. female admitted s/p fall, h/o Afib, to continue Coumadin  Goal of Therapy:  INR 2-3 Monitor platelets by anticoagulation protocol: Yes   Plan:  Coumadin 2.5 mg today Daily INR  Orli Degrave, Gary FleetGregory Vernon 06/06/2018,1:33 AM

## 2018-06-06 NOTE — Evaluation (Signed)
Physical Therapy Evaluation Patient Details Name: Carol AmorSara K Snyder MRN: [id number] DOB: 12/31/1928 Today's Date: 06/06/2018   History of Present Illness  Pt is a 84 y.o. F with significant PMH of atrial fibrillation on Coumadin, s/p pacemaker, history of CVA; who presents after having a fall at home. She was getting up from the dinner table to use the restroom and her daughter reported her complaining of feeling lightheaded before falling and caching herself on the dresser. Sustained a cut to her right shin. Reports 3 other falls over the past 2 weeks. CT (-) for acute abnormality.   Clinical Impression  Pt admitted with above diagnosis. Pt currently with functional limitations due to the deficits listed below (see PT Problem List). Patient lives alone and is independent with mobility/ADL's at baseline. Patient's daughter lives close and provides intermittent support. Patient presenting with decreased functional mobility secondary to strength/balance deficits, diminished endurance and abnormal posture. Ambulating in room with up to min assist and use of a walker. Complaint of lightheadedness but orthostatics normal (see below). Patient and patient daughter interested in SNF. Recommending SNF as patient lives alone and presents as high fall risk currently based on repeated falls and deficits listed above. Pt will benefit from skilled PT to increase their independence and safety with mobility to allow discharge to the venue listed below.    Orthostatic Vitals: Supine: 108/67 Sitting: 107/63 Standing: 103/57      Follow Up Recommendations SNF    Equipment Recommendations  None recommended by PT    Recommendations for Other Services OT consult     Precautions / Restrictions Precautions Precautions: Fall Restrictions Weight Bearing Restrictions: No      Mobility  Bed Mobility Overal bed mobility: Needs Assistance Bed Mobility: Supine to Sit     Supine to sit: Mod assist     General  bed mobility comments: Mod assist provided to elevate trunk. Patient with increased time and effort and flank pain. Encouraged to use pillow for splinting  Transfers Overall transfer level: Needs assistance Equipment used: Rolling walker (2 wheeled) Transfers: Sit to/from Stand Sit to Stand: Min assist         General transfer comment: min assist provided to boost up to standing. patient unable to transfer onto low toilet and needed high toilet riser in order to transition to sitting. x3 sit to stands from edge of bed, toilet, and recliner.   Ambulation/Gait Ambulation/Gait assistance: Min assist;Min guard Gait Distance (Feet): 20 Feet Assistive device: Rolling walker (2 wheeled) Gait Pattern/deviations: Step-through pattern;Trunk flexed;Decreased step length - right Gait velocity: decreased Gait velocity interpretation: <1.31 ft/sec, indicative of household ambulator General Gait Details: Patient with trunk flexed throughout, poor proximity to walker, and right toe in flexed position during mid stance (pt daughter states patient does this some at baseline). Able to achieve foot flat but does not maintain. able to side step and backwards walk when transitioning into small bathroom  Stairs            Wheelchair Mobility    Modified Rankin (Stroke Patients Only)       Balance Overall balance assessment: Needs assistance Sitting-balance support: Bilateral upper extremity supported;Feet supported Sitting balance-Leahy Scale: Good     Standing balance support: Bilateral upper extremity supported Standing balance-Leahy Scale: Fair                               Pertinent Vitals/Pain Pain Assessment: Faces Faces Pain  Scale: Hurts even more Pain Location: right flank (patient reports hitting her side against dresser during fall) Pain Descriptors / Indicators: Discomfort;Grimacing;Guarding Pain Intervention(s): Monitored during session;Other (comment)(RN  notified)    Home Living Family/patient expects to be discharged to:: Private residence Living Arrangements: Alone Available Help at Discharge: Family;Available PRN/intermittently Type of Home: House Home Access: Stairs to enter   Entrance Stairs-Number of Steps: 3 Home Layout: One level Home Equipment: Walker - 2 wheels;Walker - 4 wheels      Prior Function Level of Independence: Independent               Hand Dominance        Extremity/Trunk Assessment   Upper Extremity Assessment Upper Extremity Assessment: Generalized weakness    Lower Extremity Assessment Lower Extremity Assessment: Generalized weakness    Cervical / Trunk Assessment Cervical / Trunk Assessment: Kyphotic  Communication   Communication: No difficulties  Cognition Arousal/Alertness: Awake/alert Behavior During Therapy: WFL for tasks assessed/performed Overall Cognitive Status: History of cognitive impairments - at baseline                                 General Comments: Patient daughter reports patient has decreased short term memory at baseline. Able to follow all simple commands with increased time.      General Comments General comments (skin integrity, edema, etc.): Laceration on distal right leg (bandaged)    Exercises     Assessment/Plan    PT Assessment Patient needs continued PT services  PT Problem List Decreased strength;Decreased activity tolerance;Decreased balance;Decreased mobility;Pain;Decreased coordination       PT Treatment Interventions DME instruction;Stair training;Functional mobility training;Therapeutic activities;Therapeutic exercise;Balance training;Gait training;Neuromuscular re-education;Patient/family education    PT Goals (Current goals can be found in the Care Plan section)  Acute Rehab PT Goals Patient Stated Goal: not fall anymore PT Goal Formulation: With patient Time For Goal Achievement: 06/20/18 Potential to Achieve Goals:  Good    Frequency Min 2X/week   Barriers to discharge        Co-evaluation               AM-PAC PT "6 Clicks" Daily Activity  Outcome Measure Difficulty turning over in bed (including adjusting bedclothes, sheets and blankets)?: A Little Difficulty moving from lying on back to sitting on the side of the bed? : Unable Difficulty sitting down on and standing up from a chair with arms (e.g., wheelchair, bedside commode, etc,.)?: Unable Help needed moving to and from a bed to chair (including a wheelchair)?: A Little Help needed walking in hospital room?: A Little Help needed climbing 3-5 steps with a railing? : A Lot 6 Click Score: 13    End of Session Equipment Utilized During Treatment: Gait belt Activity Tolerance: Patient tolerated treatment well Patient left: in chair;with call bell/phone within reach;with family/visitor present Nurse Communication: Mobility status PT Visit Diagnosis: Unsteadiness on feet (R26.81);Muscle weakness (generalized) (M62.81);Difficulty in walking, not elsewhere classified (R26.2)    Time: 1610-9604 PT Time Calculation (min) (ACUTE ONLY): 48 min   Charges:   PT Evaluation $PT Eval Moderate Complexity: 1 Mod PT Treatments $Therapeutic Activity: 23-37 mins   PT G Codes:        Laurina Bustle, PT, DPT Acute Rehabilitation Services  Pager: [phone]   Vanetta Mulders 06/06/2018, 2:41 PM

## 2018-06-06 NOTE — ED Provider Notes (Signed)
[facility].  Please see her full H&P.  In short,  Carol Snyder is a [age] y.o. female presents for multiple complaints.  Specifically, patient has had 4 falls in the last 2 weeks.  Patient's daughter reports increasing weakness and patient states 30 pound weight loss in the last 6 months.  Patient reports she is eating but is not drinking very much fluid.  She also reports chronic diarrhea.  Patient reports that falls are not mechanical.  She reports that all of them have happened while walking.  She denies prodrome.  She does not know what makes her fall.  Record review shows that patient is taking diltiazem, amiodarone and metoprolol 100 mg 2 times per day.  In addition patient is taking Lasix.  Patient reports taking all these medications on a regular basis.  She has not missed any doses.   Physical Exam  BP 130/66   Pulse 73   Temp 97.8 F (36.6 C) (Oral)   Resp 14   Ht 5\' 6"  (1.676 m)   Wt 49.9 kg (110 lb)   SpO2 93%   BMI 17.75 kg/m   Physical Exam  Constitutional: She appears well-developed and well-nourished. No distress.  HENT:  Head: Normocephalic.  Eyes: Conjunctivae are normal. No scleral icterus.  Neck: Normal range of motion.  Cardiovascular: Normal rate and intact distal pulses.  Pulmonary/Chest: Effort normal.  Musculoskeletal: Normal range of motion.  Neurological: She is alert.  Skin: Skin is warm and dry.  Nursing note and vitals reviewed.   ED Course/Procedures   Clinical Course as of Jun 06 232  Sun Jun 06, 2018  0143 Discussed with Dr. Katrinka BlazingSmith who will admit   [HM]    Clinical Course User Index [HM] Ethleen Lormand, Boyd KerbsHannah, PA-C    Procedures  MDM    Patient presents with several complaints.  Labs suggestive of dehydration.  Weight loss is concerning for possible cancer diagnosis.  Chest x-ray without evidence of mass or metastatic disease.  CT of her head is without acute abnormality including no intracranial hemorrhage.   Patient given fluid bolus here.  Long discussion with patient and family.  I am concerned that medications are causing her falls likely due to hypotension.  They report they have seen her primary care physician several times prior to the falls and he has not been able to determine the reason for her increasing weakness or weight loss.  They are uncomfortable with going home due to progressive symptoms.  Patient will be admitted for rehydration and further work-up    Laceration of right lower leg, initial encounter  Fall, initial encounter  Generalized weakness  Dehydration     Kionte Baumgardner, Boyd KerbsHannah, PA-C 06/06/18 0233    Gilda CreasePollina, Christopher J, MD 06/06/18 [phone]

## 2018-06-06 NOTE — Progress Notes (Signed)
Carol Snyder  MWU:[id number] DOB: January 16, 1929 DOA: 06/05/2018 PCP: Kaleen MaskElkins, Wilson Oliver, MD    Brief Narrative:  84 y.o. female with a hx of atrial fibrillation on Coumadin, s/p pacemaker, chronic diarrhea after a partial colectomy, and CVA who presented after having a fall at home.  At baseline patient ambulates with walker and lives alone. Patient was getting up from the dinner table to use the restroom and began feeling lightheaded before falling, catching herself on a dresser.  She sustained a cut to her right shin.  She has had 2 other falls over the last 2 weeks with multiple bruises of her lower extremities.  Other associated symptoms include weight loss of approximately 15-20 pounds over the last couple months, and worsening diarrhea w/ 3-4 bowel movements per day.   Subjective: Pt is seen for a f/u visit.    Assessment & Plan:  Recurrent Falls with skin laceration CT brain negative for acute abnormalities - ?orthostatic hypotension vs mechanical cause   Acute kidney injury 2/2 dehydration Baseline creatinine 0.9 - now resolved w/ volume expansion   Recent Labs  Lab 06/05/18 1958 06/06/18 0147  CREATININE 1.27* 0.96    Diarrhea: Acute on chronic related to history of cholecystectomy and appendicitis status post resection with removal of part of colon  Normocytic anemia  W/ hydration Hgb now 10.6 - check anemia panel   Atrial fibrillation on chronic anticoagulation INR therapeutic at admission - continue amiodarone, Cardizem, and Coumadin per pharmacy  Essential hypertension BP well controlled   DVT prophylaxis: warfarin Code Status: FULL CODE Family Communication: no family present at time of exam  Disposition Plan:   Consultants:  none  Procedures: none  Antimicrobials:  none   Objective: Blood pressure 126/61, pulse 70, temperature 97.8 F (36.6 C), temperature source Oral, resp. rate 16, height 5\' 6"  (1.676 m), weight 48.2 kg (106 lb 4.2 oz), SpO2 91  %. No intake or output data in the 24 hours ending 06/06/18 1122 Filed Weights   06/05/18 1930 06/06/18 0301  Weight: 49.9 kg (110 lb) 48.2 kg (106 lb 4.2 oz)    Examination: Pt was seen for a f/u visit.    CBC: Recent Labs  Lab 06/05/18 1958 06/06/18 0147  WBC 8.9 7.7  HGB 12.5 10.6*  HCT 40.5 34.2*  MCV 96.0 95.3  PLT 186 169   Basic Metabolic Panel: Recent Labs  Lab 06/05/18 1958 06/06/18 0147  NA 137 139  K 4.8 4.6  CL 100 105  CO2 26 25  GLUCOSE 121* 99  BUN 32* 27*  CREATININE 1.27* 0.96  CALCIUM 9.5 8.8*   GFR: Estimated Creatinine Clearance: 32 mL/min (by C-G formula based on SCr of 0.96 mg/dL).  Liver Function Tests: Recent Labs  Lab 06/05/18 2041  AST 25  ALT 20  ALKPHOS 90  BILITOT 0.5  PROT 6.7  ALBUMIN 3.8    Coagulation Profile: Recent Labs  Lab 06/05/18 2145 06/06/18 0804  INR 2.81 2.66    Cardiac Enzymes: Recent Labs  Lab 06/06/18 0147 06/06/18 0804  TROPONINI <0.03 <0.03   CBG: Recent Labs  Lab 06/05/18 1944  GLUCAP 112*    Scheduled Meds: . amiodarone  100 mg Oral Daily  . dicyclomine  20 mg Oral BID  . diltiazem  180 mg Oral Daily  . feeding supplement (ENSURE ENLIVE)  237 mL Oral BID BM  . metoprolol tartrate  100 mg Oral BID  . sodium chloride flush  3 mL Intravenous Q12H  .  Tdap  0.5 mL Intramuscular Once  . warfarin  2.5 mg Oral ONCE-1800  . Warfarin - Pharmacist Dosing Inpatient   Does not apply q1800    LOS: 0 days   Time spent: No Charge  Lonia Blood, MD [facility] Office  [phone] Pager - Text Page per Amion as per below:  On-Call/Text Page:      [email or web address]      password [email or web address]  If 7PM-7AM, please contact night-coverage [email or web address] Password [email or web address] 06/06/2018, 11:22 AM

## 2018-06-06 NOTE — Plan of Care (Signed)
  Problem: Nutrition: Goal: Adequate nutrition will be maintained Note:  Poor diet   Problem: Elimination: Goal: Will not experience complications related to bowel motility Note:  Chronic Loose stool   Problem: Pain Managment: Goal: General experience of comfort will improve Note:  Denies pain   Problem: Safety: Goal: Ability to remain free from injury will improve Note:   Bed exit alarm one assist for help   Problem: Safety: Goal: Ability to remain free from injury will improve Note:   Bed exit alarm one assist for help   Problem: Skin Integrity: Goal: Risk for impaired skin integrity will decrease Note:  Reported Right leg wound dressed on day shift dry and intact   Problem: Skin Integrity: Goal: Risk for impaired skin integrity will decrease Note:  Reported Right leg wound dressed on day shift dry and intact   Problem: Skin Integrity: Goal: Risk for impaired skin integrity will decrease Note:  Reported Right leg wound dressed on day shift dry and intact   Problem: Skin Integrity: Goal: Risk for impaired skin integrity will decrease Note:  Reported Right leg wound dressed on day shift dry and intact   Problem: Safety: Goal: Ability to remain free from injury will improve Note:   Bed exit alarm one assist for help   Problem: Pain Managment: Goal: General experience of comfort will improve Note:  Denies pain   Problem: Elimination: Goal: Will not experience complications related to bowel motility Note:  Chronic Loose stool

## 2018-06-06 NOTE — H&P (Signed)
History and Physical    HOLIDAY MCMENAMIN ZOX:[id number] DOB: 11-Oct-1929 DOA: 06/05/2018  Referring MD/NP/PA: Dierdre Forth, PA-C PCP: Kaleen Mask, MD  Patient coming from: home   Chief Complaint:  Fall  I have personally briefly reviewed patient's old medical records in [facility]   HPI: Carol Snyder is a 84 y.o. female with medical history significant of atrial fibrillation on Coumadin, s/p pacemaker, history of CVA; who presents after having a fall at home.  At baseline patient ambulates with walker and lives alone.  History is obtained from the patient and daughter at bedside. Patient was getting up from the dinner table to use the restroom, and her daughter reported her complaining of feeling lightheaded before falling catching herself on the dresser.  She sustained a cut to her right shin.  She has had 2 other falls over the last 2 weeks with multiple bruises of her lower extremities.  Patient denies any trauma to her head or loss of consciousness with these falls.  Patient daughter also has concern for her right leg giving way as possible contributory to her falls.  Other associated symptoms include weight loss of approximately 15 to 20 pounds over the last couple months, and worsening diarrhea.  Patient reports having 3-4 bowel movements per day on average.  She has had a cholecystectomy and previously had ruptured appendix requiring surgery with resection of portion of her colon.  Patient has previously been tried on Imodium and dicyclomine without improvement of symptoms.  Her primary had recently prescribed Diphenoxylate/Atropine, but the patient has not started this medication yet.  Denies any recent antibiotic use, fever, chills, chest pain, nausea, vomiting, dysuria, shortness of breath, abdominal pain, or recent changes in medications.  Patient reports otherwise feeling fine.  ED Course: Upon admission into the emergency department patient was noted to be afebrile with  respirations 14-25, and all other vital signs maintained.  Labs revealed BUN 32, creatinine 1.27, and INR 2.8. Chest x-ray revealed stable cardiomegaly with pulmonary inflation tiny right side pleural effusion. patient was given 500 ml fluid and 4 mg of Zofran.  TRH called to admit.  Review of Systems  Constitutional: Positive for weight loss. Negative for chills and fever.  HENT: Negative for ear discharge and nosebleeds.   Eyes: Negative for photophobia and pain.  Respiratory: Negative for cough and shortness of breath.   Cardiovascular: Negative for chest pain and leg swelling.  Gastrointestinal: Positive for diarrhea. Negative for abdominal pain, nausea and vomiting.  Genitourinary: Negative for frequency and urgency.  Musculoskeletal: Positive for falls and joint pain.  Skin: Negative for itching and rash.  Neurological: Positive for dizziness. Negative for loss of consciousness and headaches.  Psychiatric/Behavioral: Negative for substance abuse and suicidal ideas.    Past Medical History:  Diagnosis Date  . Atrial fibrillation (HCC)   . Chest pain   . History of shingles   . Long term current use of anticoagulant   . Pacemaker   . Stroke [facility])     Past Surgical History:  Procedure Laterality Date  . APPENDECTOMY    . CHOLECYSTECTOMY     with partial colectomy do to gallbadder infection      reports that she quit smoking about 13 years ago. She has never used smokeless tobacco. She reports that she does not drink alcohol or use drugs.  Allergies  Allergen Reactions  . Naproxen Sodium   . Sulfa Antibiotics     Family History  Problem Relation  Age of Onset  . Heart attack Father   . Hypertension Father   . Stroke Mother   . Cancer Brother        lung  . Arrhythmia Daughter        heart palpitations    Prior to Admission medications   Medication Sig Start Date End Date Taking? Authorizing Provider  ACIDOPHILUS LACTOBACILLUS PO Take 1 tablet by mouth daily.     [provider]  amiodarone (PACERONE) 100 MG tablet Take 100 mg by mouth daily.    [provider]  diltiazem (TIAZAC) 180 MG 24 hr capsule Take 180 mg by mouth daily.    [provider]  furosemide (LASIX) 40 MG tablet Take 40 mg by mouth.    [provider]  loperamide (IMODIUM) 2 MG capsule Take 2 mg by mouth 2 (two) times daily.    [provider]  metoprolol tartrate (LOPRESSOR) 100 MG tablet Take 100 mg by mouth 2 (two) times daily.    [provider]  vitamin B-12 (CYANOCOBALAMIN) 1000 MCG tablet Take 1,000 mcg by mouth daily.    [provider]  warfarin (COUMADIN) 2.5 MG tablet Take 2.5 mg by mouth daily.    [provider]    Physical Exam:  Constitutional: Thin elderly female in NAD, calm, comfortable Vitals:   06/05/18 2000 06/05/18 2100 06/05/18 2300 06/06/18 0000  BP: 119/67 121/69 125/69 130/66  Pulse: 73 73 73 73  Resp: (!) 25 16 19 14   Temp:      TempSrc:      SpO2: 95% 98% 96% 93%  Weight:      Height:       Eyes: PERRL, lids and conjunctivae normal ENMT: Mucous membranes are dry. Posterior pharynx clear of any exudate or lesions.  Neck: normal, supple, no masses, no thyromegaly Respiratory: clear to auscultation bilaterally, no wheezing, no crackles. Normal respiratory effort. No accessory muscle use.  Cardiovascular: Regular rate and rhythm, no murmurs / rubs / gallops. No extremity edema. 2+ pedal pulses. No carotid bruits.  Abdomen: no tenderness, no masses palpated. No hepatosplenomegaly. Bowel sounds positive.  Musculoskeletal: no clubbing / cyanosis.  Other joint deformity noted. Normal ROM, no contractures. Normal muscle tone.  Skin: Skin tear of the anterior part of the right shin that is open.  With multiple bruises of the bilateral lower extremities.   Neurologic: CN 2-12 grossly intact. Sensation intact, DTR normal. Strength 5/5 in all 4.  Psychiatric: Normal judgment and insight.  Alert and oriented x 3. Normal mood.     Labs on Admission: I have personally reviewed following labs and imaging studies  CBC: Recent Labs  Lab 06/05/18 1958  WBC 8.9  HGB 12.5  HCT 40.5  MCV 96.0  PLT 186   Basic Metabolic Panel: Recent Labs  Lab 06/05/18 1958  NA 137  K 4.8  CL 100  CO2 26  GLUCOSE 121*  BUN 32*  CREATININE 1.27*  CALCIUM 9.5   GFR: Estimated Creatinine Clearance: 25 mL/min (A) (by C-G formula based on SCr of 1.27 mg/dL (H)). Liver Function Tests: Recent Labs  Lab 06/05/18 2041  AST 25  ALT 20  ALKPHOS 90  BILITOT 0.5  PROT 6.7  ALBUMIN 3.8   No results for input(s): LIPASE, AMYLASE in the last 168 hours. No results for input(s): AMMONIA in the last 168 hours. Coagulation Profile: Recent Labs  Lab 06/05/18 2145  INR 2.81   Cardiac Enzymes: No results for input(s):  CKTOTAL, CKMB, CKMBINDEX, TROPONINI in the last 168 hours. BNP (last 3 results) No results for input(s): PROBNP in the last 8760 hours. HbA1C: No results for input(s): HGBA1C in the last 72 hours. CBG: Recent Labs  Lab 06/05/18 1944  GLUCAP 112*   Lipid Profile: No results for input(s): CHOL, HDL, LDLCALC, TRIG, CHOLHDL, LDLDIRECT in the last 72 hours. Thyroid Function Tests: No results for input(s): TSH, T4TOTAL, FREET4, T3FREE, THYROIDAB in the last 72 hours. Anemia Panel: No results for input(s): VITAMINB12, FOLATE, FERRITIN, TIBC, IRON, RETICCTPCT in the last 72 hours. Urine analysis:    Component Value Date/Time   COLORURINE YELLOW 05/15/2007 1442   APPEARANCEUR CLOUDY (A) 05/15/2007 1442   LABSPEC 1.015 05/15/2007 1442   PHURINE 5.5 05/15/2007 1442   GLUCOSEU NEGATIVE 05/15/2007 1442   HGBUR NEGATIVE 05/15/2007 1442   BILIRUBINUR NEGATIVE 05/15/2007 1442   KETONESUR NEGATIVE 05/15/2007 1442   PROTEINUR NEGATIVE 05/15/2007 1442   UROBILINOGEN 0.2 05/15/2007 1442   NITRITE NEGATIVE 05/15/2007 1442   LEUKOCYTESUR SMALL (A) 05/15/2007 1442   Sepsis  Labs: No results found for this or any previous visit (from the past 240 hour(s)).   Radiological Exams on Admission: Dg Chest 2 View  Result Date: 06/05/2018 CLINICAL DATA:  Dizziness and lightheadedness. Recent falls. Weakness. EXAM: CHEST - 2 VIEW COMPARISON:  08/18/2017 FINDINGS: Stable moderate cardiomegaly. Dual lead transvenous pacemaker remains in appropriate position. Stable pulmonary hyperinflation. Tiny right pleural effusion appears new since previous study. No evidence of pulmonary consolidation or edema. IMPRESSION: Stable cardiomegaly and pulmonary hyperinflation. New tiny right pleural effusion. No evidence of frank pulmonary edema or consolidation. Electronically Signed   By: Myles Rosenthal M.D.   On: 06/05/2018 22:40   Dg Tibia/fibula Right  Result Date: 06/05/2018 CLINICAL DATA:  Fall today. Right leg injury and pain. Initial encounter. EXAM: RIGHT TIBIA AND FIBULA - 2 VIEW COMPARISON:  None. FINDINGS: There is no evidence of fracture or other focal bone lesions. Generalized osteopenia noted. Peripheral vascular calcification also seen. IMPRESSION: No evidence of fracture. Osteopenia. Electronically Signed   By: Myles Rosenthal M.D.   On: 06/05/2018 20:52   Ct Head Wo Contrast  Result Date: 06/05/2018 CLINICAL DATA:  Dizziness and lightheadedness. Ataxia. Fall today at home. Head trauma. Initial encounter. EXAM: CT HEAD WITHOUT CONTRAST TECHNIQUE: Contiguous axial images were obtained from the base of the skull through the vertex without intravenous contrast. COMPARISON:  08/16/2017 FINDINGS: Brain: No evidence of acute infarction, hemorrhage, hydrocephalus, extra-axial collection, or mass lesion/mass effect. Moderate diffuse cerebral atrophy and chronic small vessel disease show no significant change compared to prior study. Vascular:  No hyperdense vessel or other acute findings. Skull: No evidence of fracture or other significant bone abnormality. Sinuses/Orbits:  No acute findings.  Other: None. IMPRESSION: No acute intracranial abnormality. Stable cerebral atrophy and chronic small vessel disease. Electronically Signed   By: Myles Rosenthal M.D.   On: 06/05/2018 22:54    EKG: Independently reviewed.  Paced rhythm  Assessment/Plan Falls with skin laceration: Acute.  Patient reportedly stumbled at home sustaining a laceration to her right anterior shin.  CT imaging of the brain negative for any acute abnormalities.  Question orthostatic hypotension  vs mechanical cause of symptoms. - Admit to a telemetry bed - Check orthostatic vital signs  - Routine wound care - Physical therapy to eval and treat in a.m. - Question need medication adjustment patient followed by [facility]  Suspect acute kidney injury 2/2 dehydration: On  admission patient found to have elevated BUN of 39 with creatinine 1.27.  Baseline creatinine previously noted to be within normal limits around 0.9.  Patient was given 500 mL bolus in the ED. suspect prerenal cause with elevated BUN to creatinine ratio. - Gentle IV fluids of normal saline at 75 mL/h x 6hr - Recheck kidney function in a.m. - Hold nephrotoxic agents  Diarrhea: Acute on chronic.  Patient reports having multiple episodes of diarrhea per day.  Suspect related to history of cholecystectomy and history of appendicitis status post resection with removal of part of colon.  Question possibility of underlying infection. - Monitor intake and output - Check stool studies  - Continue Bentyl  Atrial fibrillation status post pacemaker, on chronic anticoagulation: INR therapeutic at 2.81.  Previous echocardiogram from 2008 noted normal ejection fraction. - Interrogate pacemaker - Continue amiodarone, Cardizem, and Coumadin per pharmacy  Essential hypertension - Continue metoprolol - Held furosemide due to     DVT prophylaxis: Coumadin Code Status: full Family Communication: Discussed plan of care with patient family  present at bedside Disposition Plan: To be determined Consults called: none  Admission status: Observation  Carol Braun MD [facility] Pager [phone]   If 7PM-7AM, please contact night-coverage [email or web address] Password [email or web address]  06/06/2018, 1:15 AM

## 2018-06-07 ENCOUNTER — Observation Stay (HOSPITAL_COMMUNITY): Payer: Medicare Other

## 2018-06-07 DIAGNOSIS — Z681 Body mass index (BMI) 19 or less, adult: Secondary | ICD-10-CM | POA: Diagnosis not present

## 2018-06-07 DIAGNOSIS — S81811A Laceration without foreign body, right lower leg, initial encounter: Secondary | ICD-10-CM | POA: Diagnosis not present

## 2018-06-07 DIAGNOSIS — Z886 Allergy status to analgesic agent status: Secondary | ICD-10-CM | POA: Diagnosis not present

## 2018-06-07 DIAGNOSIS — I340 Nonrheumatic mitral (valve) insufficiency: Secondary | ICD-10-CM

## 2018-06-07 DIAGNOSIS — Z87891 Personal history of nicotine dependence: Secondary | ICD-10-CM | POA: Diagnosis not present

## 2018-06-07 DIAGNOSIS — I361 Nonrheumatic tricuspid (valve) insufficiency: Secondary | ICD-10-CM | POA: Diagnosis not present

## 2018-06-07 DIAGNOSIS — Z8673 Personal history of transient ischemic attack (TIA), and cerebral infarction without residual deficits: Secondary | ICD-10-CM | POA: Diagnosis not present

## 2018-06-07 DIAGNOSIS — Z9049 Acquired absence of other specified parts of digestive tract: Secondary | ICD-10-CM | POA: Diagnosis not present

## 2018-06-07 DIAGNOSIS — D509 Iron deficiency anemia, unspecified: Secondary | ICD-10-CM | POA: Diagnosis not present

## 2018-06-07 DIAGNOSIS — Z79899 Other long term (current) drug therapy: Secondary | ICD-10-CM | POA: Diagnosis not present

## 2018-06-07 DIAGNOSIS — E860 Dehydration: Secondary | ICD-10-CM

## 2018-06-07 DIAGNOSIS — Z950 Presence of cardiac pacemaker: Secondary | ICD-10-CM | POA: Diagnosis not present

## 2018-06-07 DIAGNOSIS — Z882 Allergy status to sulfonamides status: Secondary | ICD-10-CM | POA: Diagnosis not present

## 2018-06-07 DIAGNOSIS — I482 Chronic atrial fibrillation: Secondary | ICD-10-CM | POA: Diagnosis not present

## 2018-06-07 DIAGNOSIS — I10 Essential (primary) hypertension: Secondary | ICD-10-CM | POA: Diagnosis not present

## 2018-06-07 DIAGNOSIS — R296 Repeated falls: Secondary | ICD-10-CM | POA: Diagnosis not present

## 2018-06-07 DIAGNOSIS — E43 Unspecified severe protein-calorie malnutrition: Secondary | ICD-10-CM | POA: Diagnosis not present

## 2018-06-07 DIAGNOSIS — W19XXXA Unspecified fall, initial encounter: Secondary | ICD-10-CM | POA: Diagnosis not present

## 2018-06-07 DIAGNOSIS — I272 Pulmonary hypertension, unspecified: Secondary | ICD-10-CM | POA: Diagnosis not present

## 2018-06-07 DIAGNOSIS — Z7901 Long term (current) use of anticoagulants: Secondary | ICD-10-CM | POA: Diagnosis not present

## 2018-06-07 DIAGNOSIS — Y92009 Unspecified place in unspecified non-institutional (private) residence as the place of occurrence of the external cause: Secondary | ICD-10-CM | POA: Diagnosis not present

## 2018-06-07 DIAGNOSIS — N179 Acute kidney failure, unspecified: Secondary | ICD-10-CM | POA: Diagnosis present

## 2018-06-07 DIAGNOSIS — I083 Combined rheumatic disorders of mitral, aortic and tricuspid valves: Secondary | ICD-10-CM | POA: Diagnosis not present

## 2018-06-07 LAB — COMPREHENSIVE METABOLIC PANEL
ALBUMIN: 3.2 g/dL — AB (ref 3.5–5.0)
ALT: 16 U/L (ref 0–44)
AST: 16 U/L (ref 15–41)
Alkaline Phosphatase: 76 U/L (ref 38–126)
Anion gap: 7 (ref 5–15)
BUN: 19 mg/dL (ref 8–23)
CHLORIDE: 105 mmol/L (ref 98–111)
CO2: 29 mmol/L (ref 22–32)
Calcium: 8.9 mg/dL (ref 8.9–10.3)
Creatinine, Ser: 0.79 mg/dL (ref 0.44–1.00)
GFR calc non Af Amer: >60 mL/min (ref >60)
GLUCOSE: 105 mg/dL — AB (ref 70–99)
POTASSIUM: 4.6 mmol/L (ref 3.5–5.1)
SODIUM: 141 mmol/L (ref 135–145)
Total Bilirubin: 0.7 mg/dL (ref 0.3–1.2)
Total Protein: 6.4 g/dL — ABNORMAL LOW (ref 6.5–8.1)

## 2018-06-07 LAB — C DIFFICILE QUICK SCREEN W PCR REFLEX
C DIFFICILE (CDIFF) INTERP: NOT DETECTED
C Diff antigen: NEGATIVE
C Diff toxin: NEGATIVE

## 2018-06-07 LAB — ECHOCARDIOGRAM COMPLETE
Height: 66 in
WEIGHTICAEL: 1798.95 [oz_av]

## 2018-06-07 LAB — PROTIME-INR
INR: 2.46
PROTHROMBIN TIME: 26.5 s — AB (ref 11.4–15.2)

## 2018-06-07 LAB — GASTROINTESTINAL PANEL BY PCR, STOOL (REPLACES STOOL CULTURE)
ASTROVIRUS: NOT DETECTED
Adenovirus F40/41: NOT DETECTED
CRYPTOSPORIDIUM: NOT DETECTED
CYCLOSPORA CAYETANENSIS: NOT DETECTED
Campylobacter species: NOT DETECTED
ENTAMOEBA HISTOLYTICA: NOT DETECTED
ENTEROTOXIGENIC E COLI (ETEC): NOT DETECTED
Enteroaggregative E coli (EAEC): NOT DETECTED
Enteropathogenic E coli (EPEC): NOT DETECTED
Giardia lamblia: NOT DETECTED
Norovirus GI/GII: NOT DETECTED
Plesimonas shigelloides: NOT DETECTED
Rotavirus A: NOT DETECTED
SALMONELLA SPECIES: NOT DETECTED
SAPOVIRUS (I, II, IV, AND V): NOT DETECTED
SHIGA LIKE TOXIN PRODUCING E COLI (STEC): NOT DETECTED
Shigella/Enteroinvasive E coli (EIEC): NOT DETECTED
VIBRIO CHOLERAE: NOT DETECTED
VIBRIO SPECIES: NOT DETECTED
YERSINIA ENTEROCOLITICA: NOT DETECTED

## 2018-06-07 LAB — CBC
HEMATOCRIT: 35.9 % — AB (ref 36.0–46.0)
HEMOGLOBIN: 11 g/dL — AB (ref 12.0–15.0)
MCH: 29.7 pg (ref 26.0–34.0)
MCHC: 30.6 g/dL (ref 30.0–36.0)
MCV: 97 fL (ref 78.0–100.0)
Platelets: 184 10*3/uL (ref 150–400)
RBC: 3.7 MIL/uL — AB (ref 3.87–5.11)
RDW: 14.2 % (ref 11.5–15.5)
WBC: 8.5 10*3/uL (ref 4.0–10.5)

## 2018-06-07 LAB — IRON AND TIBC
Iron: 25 ug/dL — ABNORMAL LOW (ref 28–170)
Saturation Ratios: 9 % — ABNORMAL LOW (ref 10.4–31.8)
TIBC: 291 ug/dL (ref 250–450)
UIBC: 266 ug/dL

## 2018-06-07 LAB — RETICULOCYTES
RBC.: 3.7 MIL/uL — AB (ref 3.87–5.11)
RETIC COUNT ABSOLUTE: 40.7 10*3/uL (ref 19.0–186.0)
RETIC CT PCT: 1.1 % (ref 0.4–3.1)

## 2018-06-07 LAB — FOLATE: Folate: 15.1 ng/mL (ref >5.9)

## 2018-06-07 LAB — FERRITIN: Ferritin: 68 ng/mL (ref 11–307)

## 2018-06-07 LAB — VITAMIN B12: VITAMIN B 12: 269 pg/mL (ref 180–914)

## 2018-06-07 MED ORDER — CYANOCOBALAMIN 1000 MCG/ML IJ SOLN
1000.0000 ug | Freq: Once | INTRAMUSCULAR | Status: AC
  Administered 2018-06-07: 1000 ug via SUBCUTANEOUS
  Filled 2018-06-07: qty 1

## 2018-06-07 MED ORDER — WARFARIN SODIUM 5 MG PO TABS: 5.0000 mg | ORAL_TABLET | Freq: Once | ORAL | Status: DC

## 2018-06-07 MED ORDER — WARFARIN SODIUM 2.5 MG PO TABS
2.5000 mg | ORAL_TABLET | Freq: Once | ORAL | Status: AC
  Administered 2018-06-07: 2.5 mg via ORAL
  Filled 2018-06-07: qty 1

## 2018-06-07 MED ORDER — DIPHENOXYLATE-ATROPINE 2.5-0.025 MG PO TABS
2.0000 | ORAL_TABLET | Freq: Four times a day (QID) | ORAL | Status: DC
  Administered 2018-06-07 – 2018-06-09 (×7): 2 via ORAL
  Filled 2018-06-07 (×7): qty 2

## 2018-06-07 MED ORDER — FERROUS SULFATE 325 (65 FE) MG PO TABS
325.0000 mg | ORAL_TABLET | Freq: Every day | ORAL | Status: DC
  Administered 2018-06-08 – 2018-06-09 (×2): 325 mg via ORAL
  Filled 2018-06-07 (×2): qty 1

## 2018-06-07 NOTE — Progress Notes (Signed)
Initial Nutrition Assessment  DOCUMENTATION CODES:   Severe malnutrition in context of chronic illness, Underweight  INTERVENTION:  Continue Ensure Enlive po BID, each supplement provides 350 kcal and 20 grams of protein.  Encourage adequate PO intake.   NUTRITION DIAGNOSIS:   Severe Malnutrition related to chronic illness as evidenced by percent weight loss, severe fat depletion.  GOAL:   Patient will meet greater than or equal to 90% of their needs  MONITOR:   PO intake, Supplement acceptance, Labs, I & O's, Skin  REASON FOR ASSESSMENT:   Malnutrition Screening Tool     ASSESSMENT:   84 y.o. female with medical history significant of atrial fibrillation on Coumadin, s/p pacemaker, history of CVA; who presents after having a fall at home.    Pt reports appetite is "so so". Pt does not favor the hospital meals, thus family has been bringing in food from home/outside. Pt reports having a 15-20 lb weight loss over the past 2 months. Usual body weight reported to be ~126 lbs. Pt with a 11% weight loss in 2 months. Family has been encouraging po intake to aid in prevention of further weight loss. Family knowledgeable on high calorie and high protein foods to aid in nutrition and weight gain. Pt reports she has been consuming 2-3 meals a day with Ensure shakes at least twice daily. Meals consists of a protein, starch, and vegetable. Pt has been experiencing diarrhea which has been worsening since cholecystectomy. Pt currently has Ensure ordered and has been consuming them. RD to continue with current orders.   Labs and medications reviewed.   NUTRITION - FOCUSED PHYSICAL EXAM:    Most Recent Value  Orbital Region  Unable to assess  Upper Arm Region  Severe depletion  Thoracic and Lumbar Region  Moderate depletion  Buccal Region  Unable to assess  Temple Region  Unable to assess  Clavicle Bone Region  Moderate depletion  Clavicle and Acromion Bone Region  Moderate depletion   Scapular Bone Region  Unable to assess  Dorsal Hand  Unable to assess  Patellar Region  Moderate depletion  Anterior Thigh Region  Moderate depletion  Posterior Calf Region  Moderate depletion  Edema (RD Assessment)  Mild  Hair  Reviewed  Eyes  Reviewed  Mouth  Reviewed  Skin  Reviewed  Nails  Reviewed       Diet Order:   Diet Order           Diet regular Room service appropriate? Yes; Fluid consistency: Thin  Diet effective now          EDUCATION NEEDS:   Education needs have been addressed  Skin:  Skin Assessment: Skin Integrity Issues: Skin Integrity Issues:: Incisions Incisions: Skin tear R lower leg  Last BM:  7/22  Height:   Ht Readings from Last 1 Encounters:  06/05/18 5\' 6"  (1.676 m)    Weight:   Wt Readings from Last 1 Encounters:  06/07/18 112 lb 7 oz (51 kg)    Ideal Body Weight:  59 kg  BMI:  Body mass index is 18.15 kg/m.  Estimated Nutritional Needs:   Kcal:  1500-1700  Protein:  60-75 grams  Fluid:  >/= 1.5 L/day    Roslyn SmilingStephanie Betha Shadix, MS, RD, LDN Pager # [phone] After hours/ weekend pager # [phone]

## 2018-06-07 NOTE — NC FL2 (Signed)
[place] MEDICAID FL2 LEVEL OF CARE SCREENING TOOL     IDENTIFICATION  Patient Name: Carol Snyder Birthdate: May 15, 1929 Sex: female Admission Date (Current Location): 06/05/2018  [facility] and [place] Number:  Producer, television/film/video and Address:  [facility] [place], [address], [place], [place] [address]      Provider Number: [id number]  Attending Physician Name and Address:  Lonia Blood, MD  Relative Name and Phone Number:  Aram Beecham, daughter, [phone]    Current Level of Care: Hospital Recommended Level of Care: Skilled Nursing Facility Prior Approval Number:    Date Approved/Denied:   PASRR Number: [id number]  Discharge Plan: SNF    Current Diagnoses: Patient Active Problem List   Diagnosis Date Noted  . Fall 06/06/2018  . AKI (acute kidney injury) (HCC) 06/06/2018  . Diarrhea 06/06/2018  . Cardiac pacemaker in situ 06/06/2018  . Chronic anticoagulation 06/06/2018  . Atrial fibrillation, chronic (HCC) 07/04/2011    Orientation RESPIRATION BLADDER Height & Weight     Self, Time, Situation, Place  O2(Nasal cannula 1L) Incontinent, External catheter Weight: 51 kg (112 lb 7 oz) Height:  5\' 6"  (167.6 cm)  BEHAVIORAL SYMPTOMS/MOOD NEUROLOGICAL BOWEL NUTRITION STATUS      Continent Diet(Please see DC Summary)  AMBULATORY STATUS COMMUNICATION OF NEEDS Skin   Limited Assist Verbally Other (Comment)(Shin skin tear)                       Personal Care Assistance Level of Assistance  Bathing, Feeding, Dressing Bathing Assistance: Maximum assistance Feeding assistance: Independent Dressing Assistance: Limited assistance     Functional Limitations Info  Sight, Hearing, Speech Sight Info: Impaired Hearing Info: Adequate Speech Info: Adequate    SPECIAL CARE FACTORS FREQUENCY  PT (By licensed PT), OT (By licensed OT)     PT Frequency: 5x/week OT Frequency: 3x/week            Contractures      Additional Factors Info   Code Status, Allergies, Isolation Precautions Code Status Info: Full Allergies Info:  Sulfa Antibiotics, Naproxen Sodium     Isolation Precautions Info: Enteric precautions (may come off since negative for c dif)     Current Medications (06/07/2018):  This is the current hospital active medication list Current Facility-Administered Medications  Medication Dose Route Frequency Provider Last Rate Last Dose  . 0.9 %  sodium chloride infusion   Intravenous Continuous Lonia Blood, MD 60 mL/hr at 06/06/18 2100    . acetaminophen (TYLENOL) tablet 650 mg  650 mg Oral Q6H PRN Smith, Rondell A, MD      . albuterol (PROVENTIL) (2.5 MG/3ML) 0.083% nebulizer solution 2.5 mg  2.5 mg Nebulization Q2H PRN Lonia Blood, MD      . amiodarone (PACERONE) tablet 100 mg  100 mg Oral Daily Katrinka Blazing, Rondell A, MD   100 mg at 06/06/18 0947  . dicyclomine (BENTYL) tablet 20 mg  20 mg Oral BID Madelyn Flavors A, MD   20 mg at 06/06/18 2129  . diltiazem (CARDIZEM CD) 24 hr capsule 180 mg  180 mg Oral Daily Katrinka Blazing, Rondell A, MD   180 mg at 06/06/18 0946  . feeding supplement (ENSURE ENLIVE) (ENSURE ENLIVE) liquid 237 mL  237 mL Oral BID BM Lonia Blood, MD   237 mL at 06/06/18 1322  . hydroxypropyl methylcellulose / hypromellose (ISOPTO TEARS / GONIOVISC) 2.5 % ophthalmic solution 1 drop  1 drop Both Eyes TID PRN  Madelyn FlavorsSmith, Rondell A, MD      . metoprolol tartrate (LOPRESSOR) tablet 100 mg  100 mg Oral BID Madelyn FlavorsSmith, Rondell A, MD   100 mg at 06/06/18 2128  . ondansetron (ZOFRAN) tablet 4 mg  4 mg Oral Q6H PRN Madelyn FlavorsSmith, Rondell A, MD       Or  . ondansetron (ZOFRAN) injection 4 mg  4 mg Intravenous Q6H PRN Smith, Rondell A, MD      . sodium chloride flush (NS) 0.9 % injection 3 mL  3 mL Intravenous Q12H Smith, Rondell A, MD   3 mL at 06/06/18 0949  . Tdap (BOOSTRIX) injection 0.5 mL  0.5 mL Intramuscular Once Madelyn FlavorsSmith, Rondell A, MD      . traMADol (ULTRAM) tablet 50 mg  50 mg Oral Q6H PRN Madelyn FlavorsSmith, Rondell A, MD   50 mg  at 06/06/18 0948  . Warfarin - Pharmacist Dosing Inpatient   Does not apply q1800 Clydie BraunSmith, Rondell A, MD         Discharge Medications: Please see discharge summary for a list of discharge medications.  Relevant Imaging Results:  Relevant Lab Results:   Additional Information SSN: [id number]  Adanely Reynoso S Crest[place], [place]

## 2018-06-07 NOTE — Progress Notes (Addendum)
GI panel back negative, enteric precautions discontinued.  Pt's daughter concerned about pt's diarrhea - daughter notes that while pt has had frequent, loose stools since colectomy she states current is more frequent and slightly looser.  Pt wanted to get up to walk but became light headed after about 6010ft in room. VSS.

## 2018-06-07 NOTE — Clinical Social Work Note (Signed)
Clinical Social Work Assessment  Patient Details  Name: Carol Snyder MRN: [id number] Date of Birth: [birth year]  Date of referral:  06/07/18               Reason for consult:  Facility Placement                Permission sought to share information with:  Facility Medical sales representativeContact Representative, Family Supports Permission granted to share information::  Yes, Verbal Permission Granted  Name::     HaematologistCynthia  Agency::  SNFs  Relationship::  Daughter  Contact Information:  [phone]  Housing/Transportation Living arrangements for the past 2 months:  Single Family Home Source of Information:  Patient, Adult Children Patient Interpreter Needed:  None Criminal Activity/Legal Involvement Pertinent to Current Situation/Hospitalization:  No - Comment as needed Significant Relationships:  Adult Children Lives with:  Self Do you feel safe going back to the place where you live?  No Need for family participation in patient care:  No (Coment)  Care giving concerns:  CSW received consult for possible SNF placement at time of discharge. CSW spoke with patient and daughter at bedside regarding PT recommendation of SNF placement at time of discharge. Patient reported that she is currently unable to care for herself at their home given patient's current physical needs and fall risk. Patient expressed understanding of PT recommendation and is agreeable to SNF placement at time of discharge. CSW to continue to follow and assist with discharge planning needs.   Social Worker assessment / plan:  CSW spoke with patient and daughter concerning [facility] before returning home.  Employment status:  Retired Database administratornsurance information:  Managed Medicare PT Recommendations:  Skilled Nursing Facility Information / Referral to community resources:  Skilled Nursing Facility  Patient/Family's Response to care:  Patient recognizes need for rehab before returning home and [place].  Patient reported preference for [facility] since she has been there several times before. [facility] may not have [facility] bed available, so their second choice is [facility]. CSW explained insurance authorization process.    Patient/Family's Understanding of and Emotional Response to Diagnosis, Current Treatment, and Prognosis:  Patient/family is realistic regarding therapy needs and expressed being hopeful for SNF placement. Patient expressed understanding of CSW role and discharge process as well as medical condition. No questions/concerns about plan or treatment.    Emotional Assessment Appearance:  Appears stated age Attitude/Demeanor/Rapport:  Engaged Affect (typically observed):  Accepting, Appropriate Orientation:  Oriented to Self, Oriented to Place, Oriented to  Time, Oriented to Situation Alcohol / Substance use:  Not Applicable Psych involvement (Current and /or in the community):  No (Comment)  Discharge Needs  Concerns to be addressed:  Care Coordination Readmission within the last 30 days:  No Current discharge risk:  None Barriers to Discharge:  Continued Medical Work up   [employer] S Deryn Massengale, LCSWA 06/07/2018, 12:04 PM

## 2018-06-07 NOTE — Progress Notes (Signed)
  Echocardiogram 2D Echocardiogram has been performed.  Carol Snyder, Carol Snyder 06/07/2018, 5:11 PM

## 2018-06-07 NOTE — Progress Notes (Signed)
Occupational Therapy Evaluation Patient Details Name: Carol Snyder MRN: [id number] DOB: Jan 12, 1929 Today's Date: 06/07/2018    History of Present Illness Pt is a 84 y.o. F with significant PMH of atrial fibrillation on Coumadin, s/p pacemaker, history of CVA; who presents after having a fall at home. She was getting up from the dinner table to use the restroom and her daughter reported her complaining of feeling lightheaded before falling and caching herself on the dresser. Sustained a cut to her right shin. Reports 3 other falls over the past 2 weeks. CT (-) for acute abnormality.    Clinical Impression   PTA, pt was living at home alone, and was independent with ADLs and received assistance from her daughter for IADLs (cooking, cleaning, laundry). PTA, pt reports multiple falls, stating "she feels fine and then is all of a sudden on the ground". Pt currently requires minA for functional mobility and with ADLs. Pt became orthostatic with in room mobility with SpO2 90%-96% RA. Due to deficits listed below (see OT problem list), pt would benefit from acute OT to address establish goals to facilitate safe D/C home. At this time, recommend SNF follow-up.   Orthostatic BPs  Supine 130/85  Sitting 122/76  Standing with reported dizziness 100/60       Follow Up Recommendations  SNF    Equipment Recommendations  3 in 1 bedside commode    Recommendations for Other Services       Precautions / Restrictions Precautions Precautions: Fall Restrictions Weight Bearing Restrictions: No      Mobility Bed Mobility Overal bed mobility: Needs Assistance Bed Mobility: Supine to Sit     Supine to sit: Min guard;HOB elevated     General bed mobility comments: increased time and effort, use of rails and minguard to progress to upright position  Transfers Overall transfer level: Needs assistance Equipment used: Rolling walker (2 wheeled) Transfers: Sit to/from Stand Sit to Stand: Min  assist         General transfer comment: min assist provided to boost up to standing from EOB and recliner;pt reports dizziness with initial stand    Balance Overall balance assessment: Needs assistance Sitting-balance support: Feet supported;No upper extremity supported Sitting balance-Leahy Scale: Good Sitting balance - Comments: able to bend over while sitting EOB to adjust socks   Standing balance support: Bilateral upper extremity supported Standing balance-Leahy Scale: Fair Standing balance comment: heavy reliance on BUE support on RW for stability while standing for initial weight shift and during ambulation                           ADL either performed or assessed with clinical judgement   ADL Overall ADL's : Needs assistance/impaired Eating/Feeding: Independent   Grooming: Minimal assistance;Standing Grooming Details (indicate cue type and reason): minA for stability while standing Upper Body Bathing: Set up;Sitting   Lower Body Bathing: Moderate assistance;Sit to/from stand   Upper Body Dressing : Set up   Lower Body Dressing: Minimal assistance;Sit to/from stand Lower Body Dressing Details (indicate cue type and reason): pt can adjust socks sitting EOB minguard for stability Toilet Transfer: Minimal assistance;Ambulation;RW Toilet Transfer Details (indicate cue type and reason): simulated with sit<>stand from EOB to recliner, 2x sit<>stand from recliner Toileting- Clothing Manipulation and Hygiene: Minimal assistance       Functional mobility during ADLs: Minimal assistance;Rolling walker General ADL Comments: pt ambulated short distances in room at RW level with minA for stability  and safety     Vision Baseline Vision/History: Wears glasses       Perception     Praxis      Pertinent Vitals/Pain Pain Assessment: No/denies pain Faces Pain Scale: Hurts little more Pain Location: R leg upon standing Pain Descriptors / Indicators:  Discomfort;Grimacing;Guarding Pain Intervention(s): Limited activity within patient's tolerance;Monitored during session     Hand Dominance Right   Extremity/Trunk Assessment Upper Extremity Assessment Upper Extremity Assessment: Generalized weakness   Lower Extremity Assessment Lower Extremity Assessment: Defer to PT evaluation;Generalized weakness   Cervical / Trunk Assessment Cervical / Trunk Assessment: Kyphotic   Communication Communication Communication: No difficulties   Cognition Arousal/Alertness: Awake/alert Behavior During Therapy: WFL for tasks assessed/performed Overall Cognitive Status: History of cognitive impairments - at baseline                                 General Comments: Patient daughter reports patient has decreased short term memory at baseline. Able to follow all simple commands with increased time.   General Comments  pt's daughter present in room during session;daughter reports she visits pt daily;educated pt and daughter on fall prevention strategies;pt reports recent weight loss;pt BP122/76 sitting EOB, upon initial sit>stand, pt reported dizziness with BP 100/60;pt returned to sitting in recliner, reported dizziness subsided. Pt then ambulated in room from recliner to door and back, reported slight dizziness upon return to recliner.     Exercises     Shoulder Instructions      Home Living Family/patient expects to be discharged to:: Private residence Living Arrangements: Alone Available Help at Discharge: Family;Available PRN/intermittently Type of Home: House Home Access: Stairs to enter [employer] of Steps: 3   Home Layout: One level     Bathroom Shower/Tub: Chief Strategy Officer: Standard Bathroom Accessibility: Yes How Accessible: Accessible via walker;Other (comment)(bathroom with toilet is not accessible) Home Equipment: Walker - 2 wheels;Walker - 4 wheels   Additional Comments: pt  tub/shower and toilet are in separate bathrooms;bathroom with toilet is not accessible via walker;bathroom with tub/shower is accessible via walker      Prior Functioning/Environment Level of Independence: Independent        Comments: pt was ambulating with walker;pt reports multiple falls since July 4th, reports she feels like she "passes out" and it comes on all of a sudden, reports no warnging signs;        OT Problem List: Decreased strength;Decreased range of motion;Decreased activity tolerance;Impaired balance (sitting and/or standing);Decreased safety awareness;Decreased knowledge of precautions;Cardiopulmonary status limiting activity;Pain      OT Treatment/Interventions: Self-care/ADL training;Therapeutic exercise;Energy conservation;DME and/or AE instruction;Therapeutic activities;Patient/family education;Balance training    OT Goals(Current goals can be found in the care plan section) Acute Rehab OT Goals Patient Stated Goal: to get stronger OT Goal Formulation: With patient Time For Goal Achievement: 06/21/18 Potential to Achieve Goals: Good  OT Frequency: Min 2X/week   Barriers to D/C: Decreased caregiver support  family available PRN       Co-evaluation              AM-PAC PT "6 Clicks" Daily Activity     Outcome Measure Help from another person eating meals?: None Help from another person taking care of personal grooming?: A Little Help from another person toileting, which includes using toliet, bedpan, or urinal?: A Little Help from another person bathing (including washing, rinsing, drying)?: A Little Help from another person  to put on and taking off regular upper body clothing?: A Little Help from another person to put on and taking off regular lower body clothing?: A Little 6 Click Score: 19   End of Session Equipment Utilized During Treatment: Gait belt;Rolling walker;Oxygen Nurse Communication: Mobility status;Other (comment)(vitals )  Activity  Tolerance: Patient tolerated treatment well Patient left: in chair;with chair alarm set;with call bell/phone within reach;with family/visitor present  OT Visit Diagnosis: Unsteadiness on feet (R26.81);Other abnormalities of gait and mobility (R26.89);Repeated falls (R29.6);Muscle weakness (generalized) (M62.81);Pain Pain - Right/Left: Right Pain - part of body: Leg                Time: 1610-9604 OT Time Calculation (min): 41 min Charges:    G-Codes:     Diona Browner OTS    Diona Browner 06/07/2018, 10:04 AM

## 2018-06-07 NOTE — Progress Notes (Signed)
Carol Snyder  ZOX:[id number] DOB: [birth year] DOA: 06/05/2018 PCP: Kaleen MaskElkins, Wilson Oliver, MD    Brief Narrative:  84 y.o. female with a hx of atrial fibrillation on Coumadin, s/p pacemaker, chronic diarrhea after a partial colectomy, and CVA who presented after having a fall at home.  At baseline patient ambulates with walker and lives alone. Patient was getting up from the dinner table to use the restroom and began feeling lightheaded before falling, catching herself on a dresser.  She sustained a cut to her right shin.  She has had 2 other falls over the last 2 weeks with multiple bruises of her lower extremities.  Other associated symptoms include weight loss of approximately 15-20 pounds over the last couple months, and worsening diarrhea w/ 3-4 bowel movements per day.   Subjective: Has c/o lightheadedness off and on today.  Is resistant to get up out of bed due to soreness from recent fall, and lightheadedness.  Denies cp, sob, f/c, n/v.  Diarrhea persists.    Assessment & Plan:  Recurrent Falls with skin laceration CT brain negative for acute abnormalities - ?orthostatic hypotension vs mechanical cause - will need SNF for rehab stay   Acute kidney injury 2/2 dehydration Baseline creatinine 0.9 - now resolved w/ volume expansion   Recent Labs  Lab 06/05/18 1958 06/06/18 0147 06/07/18 0531  CREATININE 1.27* 0.96 0.79    Diarrhea: Acute on chronic related to history of cholecystectomy and appendicitis status post resection with removal of part of colon - this is leading to signif decline as of late w/ weight loss and probable DH leading to her falls - acute infection ruled out - begin med tx in attempt to decrease frequency of diarrhea - care to avoid severe constipation   Normocytic anemia  With hydration Hgb dropped below normal range - anemia panel suggestive of Fe deficiency likely related to poor nutrition - will dose w/ B12 x1 as her level is marginal - begin Fe tx orally as  this may help slow her bowels as well   Atrial fibrillation on chronic anticoagulation INR therapeutic at admission - continue amiodarone, Cardizem, and Coumadin per pharmacy  Essential hypertension BP well controlled   Severe malnutrition in context of chronic illness, Underweight Nutrition following   DVT prophylaxis: warfarin Code Status: FULL CODE Family Communication:  Spoke w/ daughter at bedside  Disposition Plan: tele bed - ultimate dispo is d/c to SNF   Consultants:  none  Procedures: 7/22 TTE  Antimicrobials:  none   Objective: Blood pressure 120/60, pulse 64, temperature 98.2 F (36.8 C), temperature source Axillary, resp. rate 18, height 5\' 6"  (1.676 m), weight 51 kg (112 lb 7 oz), SpO2 93 %.  Intake/Output Summary (Last 24 hours) at 06/07/2018 1623 Last data filed at 06/07/2018 0700 Gross per 24 hour  Intake 1006.26 ml  Output -  Net 1006.26 ml   Filed Weights   06/05/18 1930 06/06/18 0301 06/07/18 0510  Weight: 49.9 kg (110 lb) 48.2 kg (106 lb 4.2 oz) 51 kg (112 lb 7 oz)    Examination: General: No acute respiratory distress Lungs: Clear to auscultation bilaterally without wheezes or crackles Cardiovascular: Regular rate and rhythm - 2/6 systolic M  Abdomen: Nontender, nondistended, soft, bowel sounds positive, no rebound, no ascites, no appreciable mass Extremities: No significant cyanosis, clubbing, or edema bilateral lower extremities - R shin wound dressed and dry    CBC: Recent Labs  Lab 06/05/18 1958 06/06/18 0147 06/07/18 0531  WBC 8.9  7.7 8.5  HGB 12.5 10.6* 11.0*  HCT 40.5 34.2* 35.9*  MCV 96.0 95.3 97.0  PLT 186 169 184   Basic Metabolic Panel: Recent Labs  Lab 06/05/18 1958 06/06/18 0147 06/07/18 0531  NA 137 139 141  K 4.8 4.6 4.6  CL 100 105 105  CO2 26 25 29   GLUCOSE 121* 99 105*  BUN 32* 27* 19  CREATININE 1.27* 0.96 0.79  CALCIUM 9.5 8.8* 8.9   GFR: Estimated Creatinine Clearance: 40.6 mL/min (by C-G formula  based on SCr of 0.79 mg/dL).  Liver Function Tests: Recent Labs  Lab 06/05/18 2041 06/07/18 0531  AST 25 16  ALT 20 16  ALKPHOS 90 76  BILITOT 0.5 0.7  PROT 6.7 6.4*  ALBUMIN 3.8 3.2*    Coagulation Profile: Recent Labs  Lab 06/05/18 2145 06/06/18 0804 06/07/18 0531  INR 2.81 2.66 2.46    Cardiac Enzymes: Recent Labs  Lab 06/06/18 0147 06/06/18 0804 06/06/18 1352  TROPONINI <0.03 <0.03 <0.03   CBG: Recent Labs  Lab 06/05/18 1944  GLUCAP 112*    Scheduled Meds: . amiodarone  100 mg Oral Daily  . dicyclomine  20 mg Oral BID  . diltiazem  180 mg Oral Daily  . feeding supplement (ENSURE ENLIVE)  237 mL Oral BID BM  . metoprolol tartrate  100 mg Oral BID  . sodium chloride flush  3 mL Intravenous Q12H  . Tdap  0.5 mL Intramuscular Once  . warfarin  2.5 mg Oral ONCE-1800  . Warfarin - Pharmacist Dosing Inpatient   Does not apply q1800    LOS: 0 days   Lonia Blood, MD [facility] Office  [phone] Pager - Text Page per Amion as per below:  On-Call/Text Page:      [email or web address]      password [email or web address]  If 7PM-7AM, please contact night-coverage [email or web address] Password [facility] 06/07/2018, 4:23 PM

## 2018-06-07 NOTE — Progress Notes (Signed)
OT Note Addendum for Charges    06/07/18 1029  OT Visit Information  Last OT Received On 06/07/18  OT General Charges  $OT Visit 1 Visit  OT Evaluation  $OT Eval Moderate Complexity 1 Mod  OT Treatments  $Self Care/Home Management  8-22 mins  $Therapeutic Activity 8-22 mins   Surabhi Gadea A. Brett Albinooffey, M.S., OTR/L Acute Rehab Department: [phone]

## 2018-06-07 NOTE — Progress Notes (Signed)
ANTICOAGULATION CONSULT NOTE - Follow Up Consult  Pharmacy Consult for Coumadin Indication: atrial fibrillation  Allergies  Allergen Reactions  . Sulfa Antibiotics Other (See Comments)    Childhood allergy   . Naproxen Sodium Palpitations    Patient Measurements: Height: 5\' 6"  (167.6 cm) Weight: 112 lb 7 oz (51 kg) IBW/kg (Calculated) : 59.3  Vital Signs: Temp: 98.1 F (36.7 C) (07/22 0510) Temp Source: Oral (07/22 0510) BP: 125/67 (07/22 0510) Pulse Rate: 64 (07/22 0510)  Labs: Recent Labs    06/05/18 1958 06/05/18 2145 06/06/18 0147 06/06/18 0804 06/06/18 1352 06/07/18 0531  HGB 12.5  --  10.6*  --   --  11.0*  HCT 40.5  --  34.2*  --   --  35.9*  PLT 186  --  169  --   --  184  LABPROT  --  29.4*  --  28.1*  --  26.5*  INR  --  2.81  --  2.66  --  2.46  CREATININE 1.27*  --  0.96  --   --  0.79  TROPONINI  --   --  <0.03 <0.03 <0.03  --     Estimated Creatinine Clearance: 40.6 mL/min (by C-G formula based on SCr of 0.79 mg/dL).  Assessment:   84 yr old female continues on Coumadin for atrial fibrillation and hx CVA.  Multiple falls prior to admission. Head CT negative.    INR remains therapeutic (2.46).  Some trend down since admission, but did not take dose prior to admission on 7/20.  Patient and daughter would like to stick with same schedule. She has her INR checked every 2 weeks with PCP.  Next was due 7/24.     Home Coumadin regimen:  2.5 mg x 3 days, then 5 mg x 1 day, then repeat.      Last outpatient dose was 5 mg on 7/19.  Goal of Therapy:  INR 2-3 Monitor platelets by anticoagulation protocol: Yes   Plan:   Coumadin 2.5 mg x 1 today. Usual dose per her outpatient schedule.  Discussed with patient and daughter.  Daily PT/INR.  Dennie FettersEgan, Ardis Fullwood Donovan, RPh Pager: [phone] 06/07/2018,12:47 PM

## 2018-06-08 ENCOUNTER — Other Ambulatory Visit: Payer: Self-pay

## 2018-06-08 DIAGNOSIS — N179 Acute kidney failure, unspecified: Secondary | ICD-10-CM | POA: Diagnosis not present

## 2018-06-08 DIAGNOSIS — Z7901 Long term (current) use of anticoagulants: Secondary | ICD-10-CM | POA: Diagnosis not present

## 2018-06-08 DIAGNOSIS — S81811A Laceration without foreign body, right lower leg, initial encounter: Secondary | ICD-10-CM | POA: Diagnosis not present

## 2018-06-08 DIAGNOSIS — I482 Chronic atrial fibrillation: Secondary | ICD-10-CM | POA: Diagnosis not present

## 2018-06-08 LAB — CBC
HEMATOCRIT: 34.3 % — AB (ref 36.0–46.0)
Hemoglobin: 10.7 g/dL — ABNORMAL LOW (ref 12.0–15.0)
MCH: 30.1 pg (ref 26.0–34.0)
MCHC: 31.2 g/dL (ref 30.0–36.0)
MCV: 96.3 fL (ref 78.0–100.0)
Platelets: 177 10*3/uL (ref 150–400)
RBC: 3.56 MIL/uL — ABNORMAL LOW (ref 3.87–5.11)
RDW: 14.3 % (ref 11.5–15.5)
WBC: 6.6 10*3/uL (ref 4.0–10.5)

## 2018-06-08 LAB — PROTIME-INR
INR: 2.32
Prothrombin Time: 25.3 seconds — ABNORMAL HIGH (ref 11.4–15.2)

## 2018-06-08 LAB — COMPREHENSIVE METABOLIC PANEL
ALBUMIN: 3 g/dL — AB (ref 3.5–5.0)
ALT: 15 U/L (ref 0–44)
ANION GAP: 7 (ref 5–15)
AST: 19 U/L (ref 15–41)
Alkaline Phosphatase: 74 U/L (ref 38–126)
BILIRUBIN TOTAL: 0.7 mg/dL (ref 0.3–1.2)
BUN: 18 mg/dL (ref 8–23)
CHLORIDE: 105 mmol/L (ref 98–111)
CO2: 26 mmol/L (ref 22–32)
Calcium: 8.7 mg/dL — ABNORMAL LOW (ref 8.9–10.3)
Creatinine, Ser: 0.7 mg/dL (ref 0.44–1.00)
GFR calc Af Amer: >60 mL/min (ref >60)
GFR calc non Af Amer: >60 mL/min (ref >60)
GLUCOSE: 96 mg/dL (ref 70–99)
Potassium: 4.8 mmol/L (ref 3.5–5.1)
Sodium: 138 mmol/L (ref 135–145)
TOTAL PROTEIN: 5.6 g/dL — AB (ref 6.5–8.1)

## 2018-06-08 LAB — MAGNESIUM: Magnesium: 1.7 mg/dL (ref 1.7–2.4)

## 2018-06-08 LAB — PHOSPHORUS: PHOSPHORUS: 3 mg/dL (ref 2.5–4.6)

## 2018-06-08 MED ORDER — WARFARIN SODIUM 5 MG PO TABS
5.0000 mg | ORAL_TABLET | Freq: Once | ORAL | Status: AC
  Administered 2018-06-08: 5 mg via ORAL
  Filled 2018-06-08: qty 1

## 2018-06-08 NOTE — Progress Notes (Signed)
Physical Therapy Treatment Patient Details Name: Carol AmorSara K Leap MRN: [id number] DOB: 22-Apr-1929 Today's Date: 06/08/2018    History of Present Illness Pt is a 84 y.o. F with significant PMH of atrial fibrillation on Coumadin, s/p pacemaker, history of CVA; who presents after having a fall at home. She was getting up from the dinner table to use the restroom and her daughter reported her complaining of feeling lightheaded before falling and caching herself on the dresser. Sustained a cut to her right shin. Reports 3 other falls over the past 2 weeks. CT (-) for acute abnormality.     PT Comments    Patient seen for activity progression. Worked on sit <> stand transfers x3 and ambulation to hall. Pt very slow and guarded with all activity. Cues for safety provided throughout. Continues to remain limited by fatigue. Current POC remains appropriate. Patient happy with ability to progress to halls today.   Follow Up Recommendations  SNF     Equipment Recommendations  None recommended by PT    Recommendations for Other Services OT consult     Precautions / Restrictions Precautions Precautions: Fall Restrictions Weight Bearing Restrictions: No    Mobility  Bed Mobility               General bed mobility comments: received in chair  Transfers Overall transfer level: Needs assistance Equipment used: Rolling walker (2 wheeled) Transfers: Sit to/from Stand Sit to Stand: Min assist         General transfer comment: Min assist for intial elevation to standing, increased time to perform. Performed from recliner x3 during session  Ambulation/Gait Ambulation/Gait assistance: Min guard Gait Distance (Feet): 45 Feet Assistive device: Rolling walker (2 wheeled) Gait Pattern/deviations: Step-through pattern;Trunk flexed;Decreased step length - right Gait velocity: decreased Gait velocity interpretation: <1.31 ft/sec, indicative of household ambulator General Gait Details: patient  very slow and guarded with ambulation, significat flexed posture and heavy reliance on RW. one standing rest break required   Stairs             Wheelchair Mobility    Modified Rankin (Stroke Patients Only)       Balance Overall balance assessment: Needs assistance Sitting-balance support: Feet supported;No upper extremity supported Sitting balance-Leahy Scale: Good     Standing balance support: Bilateral upper extremity supported Standing balance-Leahy Scale: Fair Standing balance comment: continues to show heavy reliance on BUE support via RW                            Cognition Arousal/Alertness: Awake/alert Behavior During Therapy: WFL for tasks assessed/performed Overall Cognitive Status: History of cognitive impairments - at baseline                                 General Comments: Patient daughter reports patient has decreased short term memory at baseline. Able to follow all simple commands with increased time.      Exercises      General Comments        Pertinent Vitals/Pain Pain Assessment: No/denies pain    Home Living                      Prior Function            PT Goals (current goals can now be found in the care plan section) Acute Rehab PT Goals Patient Stated  Goal: to get stronger PT Goal Formulation: With patient Time For Goal Achievement: 06/20/18 Potential to Achieve Goals: Good Progress towards PT goals: Progressing toward goals    Frequency    Min 2X/week      PT Plan Current plan remains appropriate    Co-evaluation              AM-PAC PT "6 Clicks" Daily Activity  Outcome Measure  Difficulty turning over in bed (including adjusting bedclothes, sheets and blankets)?: A Little Difficulty moving from lying on back to sitting on the side of the bed? : Unable Difficulty sitting down on and standing up from a chair with arms (e.g., wheelchair, bedside commode, etc,.)?:  Unable Help needed moving to and from a bed to chair (including a wheelchair)?: A Little Help needed walking in hospital room?: A Little Help needed climbing 3-5 steps with a railing? : A Lot 6 Click Score: 13    End of Session Equipment Utilized During Treatment: Gait belt Activity Tolerance: Patient tolerated treatment well Patient left: in chair;with call bell/phone within reach;with family/visitor present Nurse Communication: Mobility status PT Visit Diagnosis: Unsteadiness on feet (R26.81);Muscle weakness (generalized) (M62.81);Difficulty in walking, not elsewhere classified (R26.2)     Time: 1610-9604 PT Time Calculation (min) (ACUTE ONLY): 20 min  Charges:  $Gait Training: 8-22 mins                    G Codes:       Charlotte Crumb, PT DPT  Board Certified Neurologic Specialist [phone]    Fabio Asa 06/08/2018, 4:57 PM

## 2018-06-08 NOTE — Progress Notes (Signed)
Carol Snyder  [id number] DOB: March 03, 1929 DOA: 06/05/2018 PCP: Kaleen MaskElkins, Wilson Oliver, MD    Brief Narrative:  84 y.o. female with a hx of atrial fibrillation on Coumadin, s/p pacemaker, chronic diarrhea after a partial colectomy, and CVA who presented after having a fall at home.  At baseline patient ambulates with walker and lives alone. Patient was getting up from the dinner table to use the restroom and began feeling lightheaded before falling, catching herself on a dresser.  She sustained a cut to her right shin.  She has had 2 other falls over the last 2 weeks with multiple bruises of her lower extremities.  Other associated symptoms include weight loss of approximately 15-20 pounds over the last couple months, and worsening diarrhea w/ 3-4 bowel movements per day.   Subjective: The patient continues to report lightheadedness, particularly around the time of postural changes.  She denies chest pain nausea vomiting or abdominal pain.  She displays a poor cognitive grasp on her overall clinical condition.  Assessment & Plan:  Recurrent Falls with skin laceration CT brain negative for acute abnormalities - ?orthostatic hypotension vs mechanical cause - will need SNF for rehab stay - suspect these episodes are related to transient drops in blood pressure related to advanced age, and triple valve disease/pulmonary hypertension - will need ongoing training in regard to very slow postural changes and will need to avoid dehydration   Mod Ao regurgitation - moderate Mitral regurg - moderate Tricuspid regurg - severe B atrial enlargement - Pulm HTN (48mm Hg) Her multiple diseased valves are likely contributing to her symptoms when she is even slightly dehydrated - discussed the valve findings with patient and her daughter today and explained that even if her valve disease worsened she would not be a candidate for surgical treatment - explained need to avoid volume depletion and that dehydration would  lead to significant decrease in cardiac output and more passing out  Acute kidney injury 2/2 dehydration Baseline creatinine 0.9 - now resolved w/ volume expansion   Recent Labs  Lab 06/05/18 1958 06/06/18 0147 06/07/18 0531 06/08/18 0329  CREATININE 1.27* 0.96 0.79 0.70    Diarrhea: Acute on chronic related to history of cholecystectomy and appendicitis status post resection with removal of part of colon - this is leading to signif decline as of late w/ weight loss and DH leading to her falls - acute infection ruled out - began med tx 7/22 in attempt to decrease frequency of diarrhea - may require more than one agent    Normocytic anemia  With hydration Hgb dropped below normal range - anemia panel suggestive of Fe deficiency likely related to poor nutrition - dosed w/ B12 x1 as her level was marginal - began Fe tx orally as this may help slow her bowels as well   Atrial fibrillation on chronic anticoagulation INR therapeutic at admission - continue amiodarone, Cardizem, and Coumadin per pharmacy - cont coumadin for now as pt is going into a SNF, but may have to consider stopping coumadin if pt is to ever return to independent living   Essential hypertension BP well controlled   Severe malnutrition in context of chronic illness, Underweight Nutrition following   DVT prophylaxis: warfarin Code Status: FULL CODE Family Communication:  Spoke w/ daughter at bedside  Disposition Plan: tele bed - ultimate dispo is d/c to SNF (pt should be ready in 24-48hrs if frequency of loose stools can be decreased)  Consultants:  none  Procedures: 7/22 TTE  Antimicrobials:  none   Objective: Blood pressure 128/70, pulse 73, temperature 98 F (36.7 C), temperature source Oral, resp. rate 16, height 5\' 6"  (1.676 m), weight 51 kg (112 lb 7 oz), SpO2 92 %. No intake or output data in the 24 hours ending 06/08/18 1155 Filed Weights   06/05/18 1930 06/06/18 0301 06/07/18 0510  Weight:  49.9 kg (110 lb) 48.2 kg (106 lb 4.2 oz) 51 kg (112 lb 7 oz)    Examination: General: No acute respiratory distress - alert  Lungs: Clear to auscultation bilaterally  Cardiovascular: RRR - 2/6 systolic M  Abdomen: NT/ND, soft, BS+, no mass  Extremities: No signif edema bilateral lower extremities - R shin wound dressed and dry (is a nickel sized skin tear on anterior shin) w/o erythema or purulent d/c    CBC: Recent Labs  Lab 06/05/18 1958 06/06/18 0147 06/07/18 0531 06/08/18 0329  WBC 8.9 7.7 8.5 6.6  HGB 12.5 10.6* 11.0* 10.7*  HCT 40.5 34.2* 35.9* 34.3*  MCV 96.0 95.3 97.0 96.3  PLT 186 169 184 177   Basic Metabolic Panel: Recent Labs  Lab 06/05/18 1958 06/06/18 0147 06/07/18 0531 06/08/18 0329  NA 137 139 141 138  K 4.8 4.6 4.6 4.8  CL 100 105 105 105  CO2 26 25 29 26   GLUCOSE 121* 99 105* 96  BUN 32* 27* 19 18  CREATININE 1.27* 0.96 0.79 0.70  CALCIUM 9.5 8.8* 8.9 8.7*  MG  --   --   --  1.7  PHOS  --   --   --  3.0   GFR: Estimated Creatinine Clearance: 40.6 mL/min (by C-G formula based on SCr of 0.7 mg/dL).  Liver Function Tests: Recent Labs  Lab 06/05/18 2041 06/07/18 0531 06/08/18 0329  AST 25 16 19   ALT 20 16 15   ALKPHOS 90 76 74  BILITOT 0.5 0.7 0.7  PROT 6.7 6.4* 5.6*  ALBUMIN 3.8 3.2* 3.0*    Coagulation Profile: Recent Labs  Lab 06/05/18 2145 06/06/18 0804 06/07/18 0531 06/08/18 0329  INR 2.81 2.66 2.46 2.32    Cardiac Enzymes: Recent Labs  Lab 06/06/18 0147 06/06/18 0804 06/06/18 1352  TROPONINI <0.03 <0.03 <0.03   CBG: Recent Labs  Lab 06/05/18 1944  GLUCAP 112*    Scheduled Meds: . amiodarone  100 mg Oral Daily  . diltiazem  180 mg Oral Daily  . diphenoxylate-atropine  2 tablet Oral QID  . feeding supplement (ENSURE ENLIVE)  237 mL Oral BID BM  . ferrous sulfate  325 mg Oral Q breakfast  . metoprolol tartrate  100 mg Oral BID  . sodium chloride flush  3 mL Intravenous Q12H  . warfarin  5 mg Oral ONCE-1800  .  Warfarin - Pharmacist Dosing Inpatient   Does not apply q1800    LOS: 1 day   Lonia Blood, MD [facility] Office  [phone] Pager - Text Page per Amion as per below:  On-Call/Text Page:      [email or web address]      password [email or web address]  If 7PM-7AM, please contact night-coverage [email or web address] Password [facility] 06/08/2018, 11:55 AM

## 2018-06-08 NOTE — Progress Notes (Signed)
ANTICOAGULATION CONSULT NOTE - Follow Up Consult  Pharmacy Consult for Coumadin Indication: atrial fibrillation  Allergies  Allergen Reactions  . Sulfa Antibiotics Other (See Comments)    Childhood allergy   . Naproxen Sodium Palpitations    Patient Measurements: Height: 5\' 6"  (167.6 cm) Weight: 112 lb 7 oz (51 kg) IBW/kg (Calculated) : 59.3  Vital Signs: Temp: 98 F (36.7 C) (07/23 0548) Temp Source: Oral (07/23 0548) BP: 128/70 (07/23 0548) Pulse Rate: 73 (07/23 0548)  Labs: Recent Labs    06/06/18 0147 06/06/18 0804 06/06/18 1352 06/07/18 0531 06/08/18 0329  HGB 10.6*  --   --  11.0* 10.7*  HCT 34.2*  --   --  35.9* 34.3*  PLT 169  --   --  184 177  LABPROT  --  28.1*  --  26.5* 25.3*  INR  --  2.66  --  2.46 2.32  CREATININE 0.96  --   --  0.79 0.70  TROPONINI <0.03 <0.03 <0.03  --   --     Estimated Creatinine Clearance: 40.6 mL/min (by C-G formula based on SCr of 0.7 mg/dL).  Assessment: 84 yr old female continues on Coumadin for atrial fibrillation and hx CVA. Multiple falls prior to admission. Head CT negative.  INR is therapeutic at 2.32 today. No bleeding noted, Hgb down to 10.7, platelets are normal.  Home Coumadin regimen:  2.5 mg x 3 days, then 5 mg x 1 day, then repeat. Last outpatient dose was 5 mg on 7/19.  Goal of Therapy:  INR 2-3 Monitor platelets by anticoagulation protocol: Yes   Plan:  Coumadin 5 mg PO tonight; usual dose per her outpatient schedule Daily INR Monitor for s/sx of bleeding   Loura BackJennifer , PharmD, BCPS Clinical Pharmacist Clinical phone for 06/08/2018 until 3p is [phone] Please check AMION for all Pharmacist numbers by unit 06/08/2018 10:51 AM

## 2018-06-09 DIAGNOSIS — W19XXXD Unspecified fall, subsequent encounter: Secondary | ICD-10-CM

## 2018-06-09 DIAGNOSIS — I482 Chronic atrial fibrillation: Secondary | ICD-10-CM

## 2018-06-09 DIAGNOSIS — R197 Diarrhea, unspecified: Secondary | ICD-10-CM

## 2018-06-09 DIAGNOSIS — Z950 Presence of cardiac pacemaker: Secondary | ICD-10-CM

## 2018-06-09 DIAGNOSIS — S81811A Laceration without foreign body, right lower leg, initial encounter: Secondary | ICD-10-CM

## 2018-06-09 DIAGNOSIS — E43 Unspecified severe protein-calorie malnutrition: Secondary | ICD-10-CM

## 2018-06-09 DIAGNOSIS — N179 Acute kidney failure, unspecified: Secondary | ICD-10-CM | POA: Diagnosis not present

## 2018-06-09 DIAGNOSIS — E860 Dehydration: Secondary | ICD-10-CM

## 2018-06-09 DIAGNOSIS — Z7901 Long term (current) use of anticoagulants: Secondary | ICD-10-CM

## 2018-06-09 LAB — BASIC METABOLIC PANEL
Anion gap: 8 (ref 5–15)
BUN: 16 mg/dL (ref 8–23)
CO2: 27 mmol/L (ref 22–32)
Calcium: 9 mg/dL (ref 8.9–10.3)
Chloride: 102 mmol/L (ref 98–111)
Creatinine, Ser: 0.77 mg/dL (ref 0.44–1.00)
GFR calc Af Amer: >60 mL/min (ref >60)
Glucose, Bld: 99 mg/dL (ref 70–99)
Potassium: 4.5 mmol/L (ref 3.5–5.1)
Sodium: 137 mmol/L (ref 135–145)

## 2018-06-09 LAB — PROTIME-INR
INR: 2.21
Prothrombin Time: 24.3 seconds — ABNORMAL HIGH (ref 11.4–15.2)

## 2018-06-09 MED ORDER — DIPHENOXYLATE-ATROPINE 2.5-0.025 MG PO TABS: 1.0000 | ORAL_TABLET | Freq: Four times a day (QID) | ORAL | Status: DC | PRN

## 2018-06-09 NOTE — Discharge Summary (Signed)
Physician Discharge Summary  Carol AmorSara K Gunn [id number] DOB: 16-Oct-1929 DOA: 06/05/2018  PCP: Kaleen MaskElkins, Wilson Oliver, MD  Admit date: 06/05/2018 Discharge date: 06/09/2018  Admitted From: Home Disposition: SNF   Recommendations for Outpatient Follow-up:  1. Follow up with PCP in 1-2 weeks 2. Establish care with GI in [place]. [facility] information included, please schedule appointment as soon as possible.   Home Health: N/A Equipment/Devices: Per SNF Discharge Condition: Stable CODE STATUS: Full Diet recommendation: Heart healthy, as tolerated  Brief/Interim Summary: Carol Snyder is an 84 y.o.femalewith a hx ofatrial fibrillation on Coumadin,s/ppacemaker, chronic diarrhea after a partial colectomy, and CVA who presented after having a fall at home.At baseline patient ambulates with walkerand lives alone. Patient was getting up from the dinner table to use the restroom and began feeling lightheaded before falling, catching herself on a dresser.She sustained a cut to her right shin. She has had2 otherfalls over the last 2 weekswith multiple bruises of her lower extremities. Other associated symptoms include weight loss of approximately 15-20 pounds over the last couple months, and worsening diarrhea w/ 3-4 bowel movements per day followed by GI in [place].  She was found to have AKI due to presumed dehydration, improved with IV fluids. Lomotil started prn for loose stools with improvement after infectious work up was negative. PT has recommended short term rehabilitation at discharge.  Discharge Diagnoses:  Principal Problem:   Fall Active Problems:   Atrial fibrillation, chronic (HCC)   AKI (acute kidney injury) (HCC)   Diarrhea   Cardiac pacemaker in situ   Chronic anticoagulation   Protein-calorie malnutrition, severe   Dehydration   Laceration of right lower leg  Recurrent falls: Possibly due to orthostasis with triple valve disease and diarrhea superimposed on  deconditioning/advanced age. - Continue PT at SNF. Will need ongoing training in regard to very slow postural changes and will need to avoid dehydration   Moderate aortic regurgitation, mitral regurgitation, and tricuspid regurgitation with pulmonary HTN (48mmHg):  - Discussed with patient and daughter, not likely to be replacement candidate if this worsened. Discussed optimizing volume status.   Right shin laceration:  - Local wound care.   Acute kidney injury: Due to dehydration with improvement to normal baseline creatinine (1.27 >> 0.7).  Diarrhea: Acute on chronic (>6 months). Negative infectious work up. Felt to be related to history of cholecystectomy and appendicitis status post resection with removal of part of colon - this is leading to signif decline as of late w/ weight loss and dehydration leading to her falls.  - Continue bentyl and prn lomotil. Titrate as needed.  - Pt wishes to establish GI follow up in [place] which will need to be arranged post hospitalization.  Normocytic anemia  With hydration Hgb dropped below normal range - anemia panel suggestive of Fe deficiency likely related to poor nutrition - dosed w/ B12 x1 as her level was marginal - began Fe tx orally as this may help slow her bowels as well   Atrial fibrillation on chronic anticoagulation: INR therapeutic at admission.  - Continue amiodarone, cardizem, and coumadin  Essential hypertension - BP well controlled   Severe malnutrition in context of chronic illness, Underweight - Continue protein supplements as able. Nutrition was consulted.    Discharge Instructions Discharge Instructions    Diet - low sodium heart healthy   Complete by:  As directed    Increase activity slowly   Complete by:  As directed      Allergies as  of 06/09/2018      Reactions   Sulfa Antibiotics Other (See Comments)   Childhood allergy    Naproxen Sodium Palpitations      Medication List    TAKE these medications    amiodarone 100 MG tablet Commonly known as:  PACERONE Take 100 mg by mouth daily.   dicyclomine 20 MG tablet Commonly known as:  BENTYL Take 20 mg by mouth 2 (two) times daily.   diltiazem 180 MG 24 hr capsule Commonly known as:  TIAZAC Take 180 mg by mouth daily.   diphenoxylate-atropine 2.5-0.025 MG tablet Commonly known as:  LOMOTIL Take 1 tablet by mouth 4 (four) times daily as needed for diarrhea or loose stools.   furosemide 40 MG tablet Commonly known as:  LASIX Take 20 mg by mouth 3 (three) times a week. Monday, Wednesday and Friday   hydroxypropyl methylcellulose / hypromellose 2.5 % ophthalmic solution Commonly known as:  ISOPTO TEARS / GONIOVISC Place 1 drop into both eyes 3 (three) times daily as needed for dry eyes.   metoprolol tartrate 100 MG tablet Commonly known as:  LOPRESSOR Take 100 mg by mouth 2 (two) times daily.   warfarin 5 MG tablet Commonly known as:  COUMADIN Take 2.5-5 mg by mouth See admin instructions. Take 2.5 mg for three days then a 5 mg tablet. Repeat       Contact information for follow-up providers    Kaleen Mask, MD Follow up.   Specialty:  Family Medicine Contact information: [address] [place] [address] [phone]        Bernette Redbird, MD. Schedule an appointment as soon as possible for a visit.   Specialty:  Gastroenterology Why:  Please schedule an appointment to establish care as soon as possible.  Contact information: [address] [place] [address] [phone]            Contact information for after-discharge care    Destination    [facility]REF[facility]F .   Service:  Skilled Nursing Contact information: [address] [place] [address] [phone]                 Allergies  Allergen Reactions  . Sulfa Antibiotics Other (See Comments)    Childhood allergy   . Naproxen Sodium Palpitations     Consultations:  None  Procedures/Studies: Dg Chest 2 View  Result Date: 06/05/2018 CLINICAL DATA:  Dizziness and lightheadedness. Recent falls. Weakness. EXAM: CHEST - 2 VIEW COMPARISON:  08/18/2017 FINDINGS: Stable moderate cardiomegaly. Dual lead transvenous pacemaker remains in appropriate position. Stable pulmonary hyperinflation. Tiny right pleural effusion appears new since previous study. No evidence of pulmonary consolidation or edema. IMPRESSION: Stable cardiomegaly and pulmonary hyperinflation. New tiny right pleural effusion. No evidence of frank pulmonary edema or consolidation. Electronically Signed   By: Myles Rosenthal M.D.   On: 06/05/2018 22:40   Dg Tibia/fibula Right  Result Date: 06/05/2018 CLINICAL DATA:  Fall today. Right leg injury and pain. Initial encounter. EXAM: RIGHT TIBIA AND FIBULA - 2 VIEW COMPARISON:  None. FINDINGS: There is no evidence of fracture or other focal bone lesions. Generalized osteopenia noted. Peripheral vascular calcification also seen. IMPRESSION: No evidence of fracture. Osteopenia. Electronically Signed   By: Myles Rosenthal M.D.   On: 06/05/2018 20:52   Ct Head Wo Contrast  Result Date: 06/05/2018 CLINICAL DATA:  Dizziness and lightheadedness. Ataxia. Fall today at home. Head trauma. Initial encounter. EXAM: CT  HEAD WITHOUT CONTRAST TECHNIQUE: Contiguous axial images were obtained from the base of the skull through the vertex without intravenous contrast. COMPARISON:  08/16/2017 FINDINGS: Brain: No evidence of acute infarction, hemorrhage, hydrocephalus, extra-axial collection, or mass lesion/mass effect. Moderate diffuse cerebral atrophy and chronic small vessel disease show no significant change compared to prior study. Vascular:  No hyperdense vessel or other acute findings. Skull: No evidence of fracture or other significant bone abnormality. Sinuses/Orbits:  No acute findings. Other: None. IMPRESSION: No acute intracranial abnormality. Stable  cerebral atrophy and chronic small vessel disease. Electronically Signed   By: Myles Rosenthal M.D.   On: 06/05/2018 22:54    Subjective: Formed stool this morning, none overnight. No abdominal pain or nausea or vomiting. Feels better than admission, still weak.   Discharge Exam: Vitals:   06/08/18 2117 06/09/18 0607  BP: 132/69 (!) 146/75  Pulse: 78 90  Resp: 16 18  Temp: 99.3 F (37.4 C) 99 F (37.2 C)  SpO2: 90% 90%   General: Pt is alert, awake, not in acute distress Cardiovascular: RRR, S1/S2 +, no rubs, no gallops Respiratory: CTA bilaterally, no wheezing, no rhonchi Abdominal: Soft, not tender or distended, bowel sounds + Extremities: No edema, no cyanosis. RLE with anterior wound dressing c/d/i.  Labs: Basic Metabolic Panel: Recent Labs  Lab 06/05/18 1958 06/06/18 0147 06/07/18 0531 06/08/18 0329 06/09/18 0616  NA 137 139 141 138 137  K 4.8 4.6 4.6 4.8 4.5  CL 100 105 105 105 102  CO2 26 25 29 26 27   GLUCOSE 121* 99 105* 96 99  BUN 32* 27* 19 18 16   CREATININE 1.27* 0.96 0.79 0.70 0.77  CALCIUM 9.5 8.8* 8.9 8.7* 9.0  MG  --   --   --  1.7  --   PHOS  --   --   --  3.0  --    Liver Function Tests: Recent Labs  Lab 06/05/18 2041 06/07/18 0531 06/08/18 0329  AST 25 16 19   ALT 20 16 15   ALKPHOS 90 76 74  BILITOT 0.5 0.7 0.7  PROT 6.7 6.4* 5.6*  ALBUMIN 3.8 3.2* 3.0*   CBC: Recent Labs  Lab 06/05/18 1958 06/06/18 0147 06/07/18 0531 06/08/18 0329  WBC 8.9 7.7 8.5 6.6  HGB 12.5 10.6* 11.0* 10.7*  HCT 40.5 34.2* 35.9* 34.3*  MCV 96.0 95.3 97.0 96.3  PLT 186 169 184 177   Cardiac Enzymes: Recent Labs  Lab 06/06/18 0147 06/06/18 0804 06/06/18 1352  TROPONINI <0.03 <0.03 <0.03   CBG: Recent Labs  Lab 06/05/18 1944  GLUCAP 112*   Anemia work up Recent Labs    06/07/18 0531  VITAMINB12 269  FOLATE 15.1  FERRITIN 68  TIBC 291  IRON 25*  RETICCTPCT 1.1   Urinalysis    Component Value Date/Time   COLORURINE YELLOW 06/06/2018 1339    APPEARANCEUR CLOUDY (A) 06/06/2018 1339   LABSPEC 1.015 06/06/2018 1339   PHURINE 5.0 06/06/2018 1339   GLUCOSEU NEGATIVE 06/06/2018 1339   HGBUR LARGE (A) 06/06/2018 1339   BILIRUBINUR NEGATIVE 06/06/2018 1339   KETONESUR NEGATIVE 06/06/2018 1339   PROTEINUR NEGATIVE 06/06/2018 1339   UROBILINOGEN 0.2 05/15/2007 1442   NITRITE NEGATIVE 06/06/2018 1339   LEUKOCYTESUR NEGATIVE 06/06/2018 1339    Microbiology Recent Results (from the past 240 hour(s))  Gastrointestinal Panel by PCR , Stool     Status: None   Collection Time: 06/06/18  9:30 PM  Result Value Ref Range Status   Campylobacter species NOT  DETECTED NOT DETECTED Final   Plesimonas shigelloides NOT DETECTED NOT DETECTED Final   Salmonella species NOT DETECTED NOT DETECTED Final   Yersinia enterocolitica NOT DETECTED NOT DETECTED Final   Vibrio species NOT DETECTED NOT DETECTED Final   Vibrio cholerae NOT DETECTED NOT DETECTED Final   Enteroaggregative E coli (EAEC) NOT DETECTED NOT DETECTED Final   Enteropathogenic E coli (EPEC) NOT DETECTED NOT DETECTED Final   Enterotoxigenic E coli (ETEC) NOT DETECTED NOT DETECTED Final   Shiga like toxin producing E coli (STEC) NOT DETECTED NOT DETECTED Final   Shigella/Enteroinvasive E coli (EIEC) NOT DETECTED NOT DETECTED Final   Cryptosporidium NOT DETECTED NOT DETECTED Final   Cyclospora cayetanensis NOT DETECTED NOT DETECTED Final   Entamoeba histolytica NOT DETECTED NOT DETECTED Final   Giardia lamblia NOT DETECTED NOT DETECTED Final   Adenovirus F40/41 NOT DETECTED NOT DETECTED Final   Astrovirus NOT DETECTED NOT DETECTED Final   Norovirus GI/GII NOT DETECTED NOT DETECTED Final   Rotavirus A NOT DETECTED NOT DETECTED Final   Sapovirus (I, II, IV, and V) NOT DETECTED NOT DETECTED Final    Comment: Performed at [facility], [address] [place], [place] [address]  C difficile quick scan w PCR reflex     Status: None   Collection Time: 06/06/18  9:30 PM  Result  Value Ref Range Status   C Diff antigen NEGATIVE NEGATIVE Final   C Diff toxin NEGATIVE NEGATIVE Final   C Diff interpretation No C. difficile detected.  Final    Comment: Performed at [facility], [address] [place], [place] [address]    Time coordinating discharge: Approximately 40 minutes  Tyrone Nine, MD  [facility] 06/09/2018, 12:36 PM Pager [phone]

## 2018-06-09 NOTE — Progress Notes (Addendum)
Called to give report, got voicemail. Will attempt again.  1539 - Pt discharged to private vehicle, pt in stable condition. Report called to SNF.

## 2018-06-09 NOTE — Progress Notes (Signed)
Patient will DC to: [facility] Anticipated DC date: 06/09/18 Family notified: Daughter at bedside Transport by: Daughter by car   Per MD patient ready for DC to [facility]. RN, patient, patient's family, and facility notified of DC. Discharge Summary sent to facility. RN given number for report [phone] Room [phone]).   CSW signing off.  Cristobal GoldmannNadia Hiliana Eilts, LCSW Clinical Social Worker [phone]

## 2018-06-09 NOTE — Progress Notes (Signed)
[facility] has insurance authorization for patient to go there at discharge.   Osborne Cascoadia Emrie Gayle LCSW [phone]

## 2018-06-09 NOTE — Clinical Social Work Placement (Signed)
   CLINICAL SOCIAL WORK PLACEMENT  NOTE  Date:  06/09/2018  Patient Details  Name: Carol Snyder MRN: [id number] Date of Birth: 29-Jan-1929  Clinical Social Work is seeking post-discharge placement for this patient at the Skilled  Nursing Facility level of care (*CSW will initial, date and re-position this form in  chart as items are completed):  Yes   Patient/family provided with [facility] Clinical Social Work Department's list of facilities offering this level of care within the geographic area requested by the patient (or if unable, by the patient's family).  Yes   Patient/family informed of their freedom to choose among providers that offer the needed level of care, that participate in Medicare, Medicaid or managed care program needed by the patient, have an available bed and are willing to accept the patient.  Yes   Patient/family informed of [facility]'s ownership interest in [facility] and [facility], as well as of the fact that they are under no obligation to receive care at these facilities.  PASRR submitted to EDS on       PASRR number received on       Existing PASRR number confirmed on 06/07/18     FL2 transmitted to all facilities in geographic area requested by pt/family on 06/07/18     FL2 transmitted to all facilities within larger geographic area on       Patient informed that his/her managed care company has contracts with or will negotiate with certain facilities, including the following:        Yes   Patient/family informed of bed offers received.  [facility]     Physician recommends and patient chooses bed at      [facility] to be [facility] on 06/09/18.  Patient to be transferred to facility by Daughter by car     Patient family notified on 06/09/18 of transfer.  Name of family member notified:  Daughter at bedside     PHYSICIAN       Additional Comment:     _______________________________________________ Mearl LatinNadia S Maikol Grassia, LCSWA 06/09/2018, 2:04 PM

## 2018-06-20 ENCOUNTER — Encounter (HOSPITAL_COMMUNITY): Payer: Self-pay | Admitting: Emergency Medicine

## 2018-06-20 ENCOUNTER — Other Ambulatory Visit: Payer: Self-pay

## 2018-06-20 ENCOUNTER — Emergency Department (HOSPITAL_COMMUNITY): Payer: Medicare Other

## 2018-06-20 ENCOUNTER — Inpatient Hospital Stay (HOSPITAL_COMMUNITY)
Admission: EM | Admit: 2018-06-20 | Discharge: 2018-06-24 | DRG: 291 | Disposition: A | Discharge status: Skilled Nursing Facility | Payer: Medicare Other | Attending: Internal Medicine | Admitting: Internal Medicine

## 2018-06-20 DIAGNOSIS — Z7901 Long term (current) use of anticoagulants: Secondary | ICD-10-CM

## 2018-06-20 DIAGNOSIS — Z882 Allergy status to sulfonamides status: Secondary | ICD-10-CM | POA: Diagnosis not present

## 2018-06-20 DIAGNOSIS — J918 Pleural effusion in other conditions classified elsewhere: Secondary | ICD-10-CM | POA: Diagnosis present

## 2018-06-20 DIAGNOSIS — I482 Chronic atrial fibrillation, unspecified: Secondary | ICD-10-CM | POA: Diagnosis present

## 2018-06-20 DIAGNOSIS — I700 Atherosclerosis of aorta: Secondary | ICD-10-CM | POA: Diagnosis present

## 2018-06-20 DIAGNOSIS — E860 Dehydration: Secondary | ICD-10-CM | POA: Diagnosis present

## 2018-06-20 DIAGNOSIS — Z950 Presence of cardiac pacemaker: Secondary | ICD-10-CM

## 2018-06-20 DIAGNOSIS — E43 Unspecified severe protein-calorie malnutrition: Secondary | ICD-10-CM | POA: Diagnosis present

## 2018-06-20 DIAGNOSIS — J939 Pneumothorax, unspecified: Secondary | ICD-10-CM

## 2018-06-20 DIAGNOSIS — S81811A Laceration without foreign body, right lower leg, initial encounter: Secondary | ICD-10-CM | POA: Diagnosis present

## 2018-06-20 DIAGNOSIS — J9383 Other pneumothorax: Secondary | ICD-10-CM | POA: Diagnosis not present

## 2018-06-20 DIAGNOSIS — D649 Anemia, unspecified: Secondary | ICD-10-CM | POA: Diagnosis present

## 2018-06-20 DIAGNOSIS — Z79899 Other long term (current) drug therapy: Secondary | ICD-10-CM

## 2018-06-20 DIAGNOSIS — Z886 Allergy status to analgesic agent status: Secondary | ICD-10-CM

## 2018-06-20 DIAGNOSIS — I951 Orthostatic hypotension: Secondary | ICD-10-CM | POA: Diagnosis present

## 2018-06-20 DIAGNOSIS — K529 Noninfective gastroenteritis and colitis, unspecified: Secondary | ICD-10-CM | POA: Diagnosis present

## 2018-06-20 DIAGNOSIS — Z9981 Dependence on supplemental oxygen: Secondary | ICD-10-CM

## 2018-06-20 DIAGNOSIS — Z8673 Personal history of transient ischemic attack (TIA), and cerebral infarction without residual deficits: Secondary | ICD-10-CM

## 2018-06-20 DIAGNOSIS — Z888 Allergy status to other drugs, medicaments and biological substances status: Secondary | ICD-10-CM

## 2018-06-20 DIAGNOSIS — Z885 Allergy status to narcotic agent status: Secondary | ICD-10-CM

## 2018-06-20 DIAGNOSIS — I5033 Acute on chronic diastolic (congestive) heart failure: Principal | ICD-10-CM | POA: Diagnosis present

## 2018-06-20 DIAGNOSIS — Z66 Do not resuscitate: Secondary | ICD-10-CM | POA: Diagnosis present

## 2018-06-20 DIAGNOSIS — W19XXXA Unspecified fall, initial encounter: Secondary | ICD-10-CM | POA: Diagnosis present

## 2018-06-20 DIAGNOSIS — Z87891 Personal history of nicotine dependence: Secondary | ICD-10-CM

## 2018-06-20 DIAGNOSIS — Z681 Body mass index (BMI) 19 or less, adult: Secondary | ICD-10-CM | POA: Diagnosis not present

## 2018-06-20 DIAGNOSIS — J90 Pleural effusion, not elsewhere classified: Secondary | ICD-10-CM | POA: Diagnosis present

## 2018-06-20 DIAGNOSIS — R0602 Shortness of breath: Secondary | ICD-10-CM

## 2018-06-20 LAB — HEPATIC FUNCTION PANEL
ALBUMIN: 3.2 g/dL — AB (ref 3.5–5.0)
ALK PHOS: 85 U/L (ref 38–126)
ALT: 17 U/L (ref 0–44)
AST: 20 U/L (ref 15–41)
BILIRUBIN INDIRECT: 0.5 mg/dL (ref 0.3–0.9)
Bilirubin, Direct: 0.2 mg/dL (ref 0.0–0.2)
TOTAL PROTEIN: 6 g/dL — AB (ref 6.5–8.1)
Total Bilirubin: 0.7 mg/dL (ref 0.3–1.2)

## 2018-06-20 LAB — BASIC METABOLIC PANEL
Anion gap: 11 (ref 5–15)
BUN: 14 mg/dL (ref 8–23)
CO2: 23 mmol/L (ref 22–32)
Calcium: 8.7 mg/dL — ABNORMAL LOW (ref 8.9–10.3)
Chloride: 100 mmol/L (ref 98–111)
Creatinine, Ser: 0.87 mg/dL (ref 0.44–1.00)
GFR calc Af Amer: >60 mL/min (ref >60)
GFR calc non Af Amer: 59 mL/min — ABNORMAL LOW (ref >60)
Glucose, Bld: 88 mg/dL (ref 70–99)
Potassium: 4.4 mmol/L (ref 3.5–5.1)
Sodium: 134 mmol/L — ABNORMAL LOW (ref 135–145)

## 2018-06-20 LAB — CBC WITH DIFFERENTIAL/PLATELET
Abs Immature Granulocytes: 0 10*3/uL (ref 0.0–0.1)
Basophils Absolute: 0.1 10*3/uL (ref 0.0–0.1)
Basophils Relative: 1 %
Eosinophils Absolute: 0.2 10*3/uL (ref 0.0–0.7)
Eosinophils Relative: 3 %
HCT: 34 % — ABNORMAL LOW (ref 36.0–46.0)
Hemoglobin: 10.7 g/dL — ABNORMAL LOW (ref 12.0–15.0)
Immature Granulocytes: 0 %
Lymphocytes Relative: 19 %
Lymphs Abs: 1 10*3/uL (ref 0.7–4.0)
MCH: 30.1 pg (ref 26.0–34.0)
MCHC: 31.5 g/dL (ref 30.0–36.0)
MCV: 95.5 fL (ref 78.0–100.0)
Monocytes Absolute: 0.6 10*3/uL (ref 0.1–1.0)
Monocytes Relative: 11 %
Neutro Abs: 3.6 10*3/uL (ref 1.7–7.7)
Neutrophils Relative %: 66 %
Platelets: 192 10*3/uL (ref 150–400)
RBC: 3.56 MIL/uL — ABNORMAL LOW (ref 3.87–5.11)
RDW: 14.6 % (ref 11.5–15.5)
WBC: 5.5 10*3/uL (ref 4.0–10.5)

## 2018-06-20 LAB — TROPONIN I: Troponin I: <0.03 ng/mL (ref <0.03)

## 2018-06-20 LAB — PROTIME-INR
INR: 1.5
Prothrombin Time: 18 seconds — ABNORMAL HIGH (ref 11.4–15.2)

## 2018-06-20 LAB — BRAIN NATRIURETIC PEPTIDE: B Natriuretic Peptide: 782.3 pg/mL — ABNORMAL HIGH (ref 0.0–100.0)

## 2018-06-20 MED ORDER — POTASSIUM CHLORIDE CRYS ER 20 MEQ PO TBCR
20.0000 meq | EXTENDED_RELEASE_TABLET | Freq: Once | ORAL | Status: AC
Start: 2018-06-20 — End: 2018-06-20
  Administered 2018-06-20: 20 meq via ORAL
  Filled 2018-06-20: qty 1

## 2018-06-20 MED ORDER — WARFARIN - PHARMACIST DOSING INPATIENT
Freq: Every day | Status: DC
  Administered 2018-06-20: 18:00:00

## 2018-06-20 MED ORDER — HYPROMELLOSE (GONIOSCOPIC) 2.5 % OP SOLN
1.0000 [drp] | Freq: Three times a day (TID) | OPHTHALMIC | Status: DC | PRN
Start: 2018-06-20 — End: 2018-06-24
  Administered 2018-06-23: 1 [drp] via OPHTHALMIC
  Filled 2018-06-20: qty 15

## 2018-06-20 MED ORDER — ONDANSETRON HCL 4 MG PO TABS: 4.0000 mg | ORAL_TABLET | Freq: Four times a day (QID) | ORAL | Status: DC | PRN

## 2018-06-20 MED ORDER — FUROSEMIDE 10 MG/ML IJ SOLN
20.0000 mg | Freq: Once | INTRAMUSCULAR | Status: AC
Start: 2018-06-20 — End: 2018-06-20
  Administered 2018-06-20: 20 mg via INTRAVENOUS
  Filled 2018-06-20: qty 2

## 2018-06-20 MED ORDER — FUROSEMIDE 20 MG PO TABS
20.0000 mg | ORAL_TABLET | Freq: Every day | ORAL | Status: DC
  Administered 2018-06-20 – 2018-06-24 (×5): 20 mg via ORAL
  Filled 2018-06-20 (×5): qty 1

## 2018-06-20 MED ORDER — ACETAMINOPHEN 325 MG PO TABS
650.0000 mg | ORAL_TABLET | ORAL | Status: DC | PRN
  Administered 2018-06-21 – 2018-06-22 (×2): 650 mg via ORAL
  Filled 2018-06-20 (×2): qty 2

## 2018-06-20 MED ORDER — POTASSIUM CHLORIDE CRYS ER 20 MEQ PO TBCR
40.0000 meq | EXTENDED_RELEASE_TABLET | Freq: Once | ORAL | Status: AC
Start: 2018-06-20 — End: 2018-06-20
  Administered 2018-06-20: 40 meq via ORAL
  Filled 2018-06-20: qty 2

## 2018-06-20 MED ORDER — FUROSEMIDE 10 MG/ML IJ SOLN
40.0000 mg | Freq: Once | INTRAMUSCULAR | Status: AC
Start: 2018-06-20 — End: 2018-06-20
  Administered 2018-06-20: 40 mg via INTRAVENOUS
  Filled 2018-06-20: qty 4

## 2018-06-20 MED ORDER — METOPROLOL TARTRATE 50 MG PO TABS
50.0000 mg | ORAL_TABLET | Freq: Two times a day (BID) | ORAL | Status: DC
  Administered 2018-06-20 – 2018-06-24 (×8): 50 mg via ORAL
  Filled 2018-06-20 (×8): qty 1

## 2018-06-20 MED ORDER — DILTIAZEM HCL ER COATED BEADS 180 MG PO CP24
180.0000 mg | ORAL_CAPSULE | Freq: Every day | ORAL | Status: DC
  Administered 2018-06-20 – 2018-06-24 (×5): 180 mg via ORAL
  Filled 2018-06-20 (×6): qty 1

## 2018-06-20 MED ORDER — WARFARIN SODIUM 2.5 MG PO TABS
4.5000 mg | ORAL_TABLET | Freq: Once | ORAL | Status: AC
  Administered 2018-06-20: 4.5 mg via ORAL
  Filled 2018-06-20: qty 1

## 2018-06-20 MED ORDER — DIPHENOXYLATE-ATROPINE 2.5-0.025 MG PO TABS
1.0000 | ORAL_TABLET | Freq: Four times a day (QID) | ORAL | Status: DC | PRN
  Administered 2018-06-21: 1 via ORAL
  Filled 2018-06-20: qty 1

## 2018-06-20 MED ORDER — ONDANSETRON HCL 4 MG/2ML IJ SOLN: 4.0000 mg | Freq: Four times a day (QID) | INTRAMUSCULAR | Status: DC | PRN

## 2018-06-20 MED ORDER — AMIODARONE HCL 200 MG PO TABS
100.0000 mg | ORAL_TABLET | Freq: Every day | ORAL | Status: DC
  Administered 2018-06-20 – 2018-06-24 (×5): 100 mg via ORAL
  Filled 2018-06-20 (×6): qty 1

## 2018-06-20 NOTE — ED Notes (Signed)
Attempted report x1. 

## 2018-06-20 NOTE — H&P (Signed)
History and Physical    Carol Snyder ZOX:[id number] DOB: [birth year] DOA: 06/20/2018  PCP: Kaleen Mask, MD  Patient coming from: SNF  Chief Complaint: SOB, weak  HPI: Carol Snyder is a 84 y.o. female with medical history significant of stroke (possibly per family), Afib, pacemaker, Shingles who presents for SOB and weakness.  Carol Snyder was recently admitted to the hospital for laceration of the right leg, syncope and dehydration.  She was discharged on 06/09/18.  At that time, her lasix was decreased from 40mg  daily to 20mg  MWF to help with her dehydration and passing out/falls.  She was noted to have a small right pleural effusion at the time of admission as well.  Today she presents with 1-2 days of weakness and not feeling well.  Today she had SOB, was in the 80s-low 90s on room air (not on oxygen) and had increased WOB so was brought to the ED.  She denied chest pain, fever, chills.  She may have had a bit of a runny nose, cough for 1 day.  She has been more sleepy and she did complain of a choking sensation and palpitations prior to admission.   ED Course: In the ED, she was found to have pulmonary edema and worsening pleural effusion on the right on CXR.  She was noted to have pedal edema.  Her BNP was 782.  Na 134.  Cr 0.87. H/H mildly low which is chronic for her. She is on coumadin for Afib and her INR was 1.5 which is subtherapeutic.   Review of Systems: As per HPI otherwise 10 point review of systems negative.    Past Medical History:  Diagnosis Date  . Atrial fibrillation (HCC)   . Chest pain   . History of shingles   . Long term current use of anticoagulant   . Pacemaker   . Stroke [facility])     Past Surgical History:  Procedure Laterality Date  . APPENDECTOMY    . CHOLECYSTECTOMY     with partial colectomy do to gallbadder infection    Reviewed with patient.   reports that she quit smoking about 13 years ago. She has never used smokeless tobacco. She reports that she  does not drink alcohol or use drugs.  Allergies  Allergen Reactions  . Codeine   . Enbrel [Etanercept]   . Fosamax [Alendronate Sodium]   . Hydromorphone   . Motrin [Ibuprofen]   . Sulfa Antibiotics Other (See Comments)    Childhood allergy   . Voltaren [Diclofenac]   . Naproxen Sodium Palpitations   Reviewed with patient.  Family History  Problem Relation Age of Onset  . Heart attack Father   . Hypertension Father   . Stroke Mother   . Cancer Brother        lung  . Arrhythmia Daughter        heart palpitations     Prior to Admission medications   Medication Sig Start Date End Date Taking? Authorizing Provider  acetaminophen (TYLENOL) 325 MG tablet Take 650 mg by mouth every 4 (four) hours as needed for mild pain or fever (or if temp is 99.0 or greater for 72 hours.).   Yes [provider]  amiodarone (PACERONE) 100 MG tablet Take 100 mg by mouth daily.   Yes [provider]  diltiazem (TIAZAC) 180 MG 24 hr capsule Take 180 mg by mouth daily.   Yes [provider]  diphenoxylate-atropine (LOMOTIL) 2.5-0.025 MG tablet Take 1 tablet  by mouth 4 (four) times daily as needed for diarrhea or loose stools. Patient taking differently: Take 2 tablets by mouth 4 (four) times daily as needed for diarrhea or loose stools.  06/09/18  Yes Tyrone NineGrunz, Ryan B, MD  furosemide (LASIX) 20 MG tablet Take 20 mg by mouth 3 (three) times a week.    Yes [provider]  hydroxypropyl methylcellulose / hypromellose (ISOPTO TEARS / GONIOVISC) 2.5 % ophthalmic solution Place 1 drop into both eyes 3 (three) times daily as needed for dry eyes.   Yes [provider]  loperamide (IMODIUM) 2 MG capsule Take 4 mg by mouth daily.   Yes [provider]  metoprolol tartrate (LOPRESSOR) 50 MG tablet Take 50 mg by mouth 2 (two) times daily.    Yes [provider]  warfarin (COUMADIN) 2.5 MG tablet Take 2.5 mg by mouth 3 (three) times a week. Mondays,  Wednesday, Friday.   Yes [provider]  warfarin (COUMADIN) 3 MG tablet Take 3 mg by mouth 3 (three) times a week. Tuesday, Thursday, Saturday, and Sunday.   Yes [provider]    Physical Exam:  Constitutional: NAD, calm, lying in bed Vitals:   06/20/18 1515 06/20/18 1530 06/20/18 1545 06/20/18 1600  BP: 124/77 124/80 135/79 117/71  Pulse: 84 84 85 89  Resp: (!) 23 (!) 23 18 20   Temp:      TempSrc:      SpO2: 92% 94% 94% 95%   Eyes: EOMI, puffy eyes bilaterally which is new,  ENMT: Mucous membranes are moist.  BIPAP mask in place Neck: normal, supple, unable to assess JVD given mask in place Respiratory: Decreased breath sounds on the right to the mid lung fields, crackles above that.  No wheezing Cardiovascular: Irregularly irregular, NR, no murmur.  Pacemaker in place and no erythema or tenderness to palpation.  She has pitting edema to the ankles bilaterally Abdomen: +BS, NT, ND Musculoskeletal: No clubbing or cyanosis.  She has thin extremities and some chronic well healed wounds on the feet.  Skin: She has a wound on the right leg which is covered with eschar.  She has varicosities and chronic skin changes throughout.  Neurologic: She has some mild right sided weakness of the leg, difficulty straightening the leg.  She reports this is chronic.  Otherwise, she is grossly intact neurologically Psychiatric: Normal judgment and insight. Alert and oriented x 3. Appears fatigued.    Labs on Admission: I have personally reviewed following labs and imaging studies  CBC: Recent Labs  Lab 06/20/18 0945  WBC 5.5  NEUTROABS 3.6  HGB 10.7*  HCT 34.0*  MCV 95.5  PLT 192   Basic Metabolic Panel: Recent Labs  Lab 06/20/18 0945  NA 134*  K 4.4  CL 100  CO2 23  GLUCOSE 88  BUN 14  CREATININE 0.87  CALCIUM 8.7*   GFR: Estimated Creatinine Clearance: 38.8 mL/min (by C-G formula based on SCr of 0.87 mg/dL). Liver Function Tests: No results for input(s):  AST, ALT, ALKPHOS, BILITOT, PROT, ALBUMIN in the last 168 hours. No results for input(s): LIPASE, AMYLASE in the last 168 hours. No results for input(s): AMMONIA in the last 168 hours. Coagulation Profile: Recent Labs  Lab 06/20/18 0945  INR 1.50   Cardiac Enzymes: Recent Labs  Lab 06/20/18 0945  TROPONINI <0.03   BNP (last 3 results) No results for input(s): PROBNP in the last 8760 hours. HbA1C: No results for input(s): HGBA1C in the last 72 hours.  CBG: No results for input(s): GLUCAP in the last 168 hours. Lipid Profile: No results for input(s): CHOL, HDL, LDLCALC, TRIG, CHOLHDL, LDLDIRECT in the last 72 hours. Thyroid Function Tests: No results for input(s): TSH, T4TOTAL, FREET4, T3FREE, THYROIDAB in the last 72 hours. Anemia Panel: No results for input(s): VITAMINB12, FOLATE, FERRITIN, TIBC, IRON, RETICCTPCT in the last 72 hours. Urine analysis:    Component Value Date/Time   COLORURINE YELLOW 06/06/2018 1339   APPEARANCEUR CLOUDY (A) 06/06/2018 1339   LABSPEC 1.015 06/06/2018 1339   PHURINE 5.0 06/06/2018 1339   GLUCOSEU NEGATIVE 06/06/2018 1339   HGBUR LARGE (A) 06/06/2018 1339   BILIRUBINUR NEGATIVE 06/06/2018 1339   KETONESUR NEGATIVE 06/06/2018 1339   PROTEINUR NEGATIVE 06/06/2018 1339   UROBILINOGEN 0.2 05/15/2007 1442   NITRITE NEGATIVE 06/06/2018 1339   LEUKOCYTESUR NEGATIVE 06/06/2018 1339    Radiological Exams on Admission: Dg Chest Portable 1 View  Result Date: 06/20/2018 CLINICAL DATA:  Shortness of breath EXAM: PORTABLE CHEST 1 VIEW COMPARISON:  06/05/2017 FINDINGS: Cardiac shadow remains enlarged. Pacing device is again seen. Increasing right-sided pleural effusion right basilar atelectasis is noted. No acute bony abnormality is noted. IMPRESSION: Increasing right basilar atelectasis and right pleural effusion. Electronically Signed   By: Alcide Clever M.D.   On: 06/20/2018 10:08    EKG: Independently reviewed. V paced, atrial  fibrillation  Assessment/Plan Right sided Pleural effusion, SOB - Etiology is not clear, she has definitely had a decrease in her lasix which is playing a part.  Last TTE showed preserved EF, but diastolic parameters could not be determined given Afib.  She could possibly be having rate related failure, but HR has been controlled since in the hospital. She did well and improved with doses of lasix.  She does not have any objective findings for pneumonia.  She did have a cough of one day duration, but no fever, chills or sick contacts. If does not improved with diuresis alone, could consider thoracentesis with discussion with patient and family.  She has a history of smoking.  If improves with lasix, could consider outpatient CT scan of the chest.  - IV lasix total of 60mg  given in ED, weaned off of BIPAP - Lasix 20 mg daily started (increased to daily) - I/O - Oxygen as needed    Atrial fibrillation, chronic  - Rate controlled, also on amiodarone - Telemetry - Continue rate control with metoprolol and diltiazem - Check TSH  - LFTs were okay on 7/23, add on hepatic panel to labs from today.  - Continue coumadin, INR subtherapeutic, pharmacy managed    Protein-calorie malnutrition, severe - Add on LFTs as noted above - Nutrition consult - Could be playing a part in volume status    Laceration of right lower leg - Appears to be healing, no erythema surrounding.  Monitor daily     DVT prophylaxis: Coumadin Code Status: DNR, Gold sheet presented from SNF Family Communication: Daughter at bedside Disposition Plan: Admit for treatment of pulmonary edema Consults called: None Admission status: Inpatient, telemetry   Debe Coder MD [facility] Pager [phone]  If 7PM-7AM, please contact night-coverage [email or web address] Password [email or web address]  06/20/2018, 4:25 PM

## 2018-06-20 NOTE — Progress Notes (Signed)
ANTICOAGULATION CONSULT NOTE - Initial Consult  Pharmacy Consult for warfarin Indication: atrial fibrillation and hx stroke  Allergies  Allergen Reactions  . Codeine   . Enbrel [Etanercept]   . Fosamax [Alendronate Sodium]   . Hydromorphone   . Motrin [Ibuprofen]   . Sulfa Antibiotics Other (See Comments)    Childhood allergy   . Voltaren [Diclofenac]   . Naproxen Sodium Palpitations    Patient Measurements:    Vital Signs: Temp: 97.7 F (36.5 C) (08/04 0936) Temp Source: Axillary (08/04 0936) BP: 118/74 (08/04 1400) Pulse Rate: 77 (08/04 1400)  Labs: Recent Labs    06/20/18 0945  HGB 10.7*  HCT 34.0*  PLT 192  LABPROT 18.0*  INR 1.50  CREATININE 0.87  TROPONINI <0.03    Estimated Creatinine Clearance: 38.8 mL/min (by C-G formula based on SCr of 0.87 mg/dL).   Medical History: Past Medical History:  Diagnosis Date  . Atrial fibrillation (HCC)   . Chest pain   . History of shingles   . Long term current use of anticoagulant   . Pacemaker   . Stroke [facility](HCC)     Medications:  See electronic list once complete  Assessment: 84 y/o female who presents to the ED with SOB and not feeling well this morning. She takes warfarin PTA for Afib and hx stroke.  INR is subtherapeutic at 1.5. No bleeding noted, Hgb 10.7, platelets are normal.  PTA regimen: 3 mg daily except 2.5 mg MWF, last dose 8/3  Goal of Therapy:  INR 2-3 Monitor platelets by anticoagulation protocol: Yes   Plan:  Warfarin 4.5 mg PO tonight Daily INR Monitor for s/sx of bleeding   Loura BackJennifer Rockham, PharmD, BCPS Clinical Pharmacist Clinical phone for 06/20/2018 until 3p is [phone] Please check AMION for all Pharmacist numbers by unit 06/20/2018 3:00 PM

## 2018-06-20 NOTE — Progress Notes (Signed)
Took patient off of BiPAP and placed on 4LNC, SATS 95%, will continue to monitor patient.

## 2018-06-20 NOTE — ED Provider Notes (Signed)
[facility] EMERGENCY DEPARTMENT Provider Note   CSN: [id number] Arrival date & time: 06/20/18  0920     History   Chief Complaint Chief Complaint  Patient presents with  . SOB/C-PAP    HPI Carol Snyder is a 84 y.o. female.  HPI   84 year old female with dyspnea.  She woke up this morning and generally did not feel well.  She tried to get up to use the bathroom and felt generally weak.  She felt short of breath and called for help.  On EMS arrival she had increased work of breathing and seemed to be distressed.  Oxygen saturations are in the high 80s to low 90s on room air.  They placed her on CPAP.   She states that she felt much better shortly after EMS placed her on it.  She denies any pain but feels like she has congestion in her chest and throat.  No cough.  She has some lower extremity swelling but cannot tell me whether this is more acute or not.  Past Medical History:  Diagnosis Date  . Atrial fibrillation (HCC)   . Chest pain   . History of shingles   . Long term current use of anticoagulant   . Pacemaker   . Stroke [facility])     Patient Active Problem List   Diagnosis Date Noted  . Protein-calorie malnutrition, severe 06/07/2018  . Dehydration   . Laceration of right lower leg   . Fall 06/06/2018  . AKI (acute kidney injury) (HCC) 06/06/2018  . Diarrhea 06/06/2018  . Cardiac pacemaker in situ 06/06/2018  . Chronic anticoagulation 06/06/2018  . Atrial fibrillation, chronic (HCC) 07/04/2011    Past Surgical History:  Procedure Laterality Date  . APPENDECTOMY    . CHOLECYSTECTOMY     with partial colectomy do to gallbadder infection      OB History   None      Home Medications    Prior to Admission medications   Medication Sig Start Date End Date Taking? Authorizing Provider  acetaminophen (TYLENOL) 325 MG tablet Take 650 mg by mouth every 4 (four) hours as needed for mild pain or fever (or if temp is 99.0 or greater for 72 hours.).    Yes [provider]  amiodarone (PACERONE) 100 MG tablet Take 100 mg by mouth daily.   Yes [provider]  diltiazem (TIAZAC) 180 MG 24 hr capsule Take 180 mg by mouth daily.   Yes [provider]  diphenoxylate-atropine (LOMOTIL) 2.5-0.025 MG tablet Take 1 tablet by mouth 4 (four) times daily as needed for diarrhea or loose stools. Patient taking differently: Take 2 tablets by mouth 4 (four) times daily as needed for diarrhea or loose stools.  06/09/18  Yes Tyrone Nine, MD  furosemide (LASIX) 20 MG tablet Take 20 mg by mouth 3 (three) times a week.    Yes [provider]  hydroxypropyl methylcellulose / hypromellose (ISOPTO TEARS / GONIOVISC) 2.5 % ophthalmic solution Place 1 drop into both eyes 3 (three) times daily as needed for dry eyes.   Yes [provider]  loperamide (IMODIUM) 2 MG capsule Take 4 mg by mouth daily.   Yes [provider]  metoprolol tartrate (LOPRESSOR) 50 MG tablet Take 50 mg by mouth 2 (two) times daily.    Yes [provider]  warfarin (COUMADIN) 2.5 MG tablet Take 2.5 mg by mouth 3 (three) times a week. Mondays, Wednesday, Friday.   Yes [provider]  warfarin (COUMADIN) 3 MG tablet Take 3 mg by mouth 3 (three) times a week. Tuesday, Thursday, Saturday, and Sunday.   Yes [provider]    Family History Family History  Problem Relation Age of Onset  . Heart attack Father   . Hypertension Father   . Stroke Mother   . Cancer Brother        lung  . Arrhythmia Daughter        heart palpitations    Social History Social History   Tobacco Use  . Smoking status: Former Smoker    Last attempt to quit: 11/17/2004    Years since quitting: 13.5  . Smokeless tobacco: Never Used  Substance Use Topics  . Alcohol use: No  . Drug use: No     Allergies   Codeine; Enbrel [etanercept]; Fosamax [alendronate sodium]; Hydromorphone; Motrin [ibuprofen]; Sulfa antibiotics; Voltaren  [diclofenac]; and Naproxen sodium   Review of Systems Review of Systems  All systems reviewed and negative, other than as noted in HPI.  Physical Exam Updated Vital Signs BP 122/72   Pulse 75   Temp 97.7 F (36.5 C) (Axillary)   Resp (!) 26   SpO2 99%   Physical Exam  Constitutional: She appears well-developed and well-nourished. No distress.  HENT:  Head: Normocephalic and atraumatic.  Eyes: Conjunctivae are normal. Right eye exhibits no discharge. Left eye exhibits no discharge.  Neck: Neck supple.  Cardiovascular: Normal rate, regular rhythm and normal heart sounds. Exam reveals no gallop and no friction rub.  No murmur heard. Pulmonary/Chest: Effort normal and breath sounds normal. No respiratory distress.  Abdominal: Soft. She exhibits no distension. There is no tenderness.  Musculoskeletal: She exhibits no edema or tenderness.  Neurological:  Drowsy. Hard to understand her with bipap but answering questions appropriately. Follows commands. No focal deficits noted.   Skin: Skin is warm and dry.  Psychiatric: She has a normal mood and affect. Her behavior is normal. Thought content normal.  Nursing note and vitals reviewed.    ED Treatments / Results  Labs (all labs ordered are listed, but only abnormal results are displayed) Labs Reviewed  CBC WITH DIFFERENTIAL/PLATELET - Abnormal; Notable for the following components:      Result Value   RBC 3.56 (*)    Hemoglobin 10.7 (*)    HCT 34.0 (*)    All other components within normal limits  BASIC METABOLIC PANEL - Abnormal; Notable for the following components:   Sodium 134 (*)    Calcium 8.7 (*)    GFR calc non Af Amer 59 (*)    All other components within normal limits  BRAIN NATRIURETIC PEPTIDE - Abnormal; Notable for the following components:   B Natriuretic Peptide 782.3 (*)    All other components within normal limits  PROTIME-INR - Abnormal; Notable for the following components:   Prothrombin Time 18.0 (*)     All other components within normal limits  TROPONIN I    EKG EKG Interpretation  Date/Time:  Sunday June 20 2018 09:49:58 EDT Ventricular Rate:  84 PR Interval:    QRS Duration: 93 QT Interval:  395 QTC Calculation: 486 R Axis:   78 Text Interpretation:  Afib/flut and V-paced complexes No further rhythm analysis attempted due to paced rhythm Nonspecific repol abnormality, diffuse leads Confirmed by Raeford Razor [phone]) on 06/20/2018 10:35:57 AM   Radiology Dg Chest Portable 1 View  Result Date: 06/20/2018 CLINICAL DATA:  Shortness of breath EXAM: PORTABLE CHEST 1  VIEW COMPARISON:  06/05/2017 FINDINGS: Cardiac shadow remains enlarged. Pacing device is again seen. Increasing right-sided pleural effusion right basilar atelectasis is noted. No acute bony abnormality is noted. IMPRESSION: Increasing right basilar atelectasis and right pleural effusion. Electronically Signed   By: Alcide CleverMark  Lukens M.D.   On: 06/20/2018 10:08    Procedures Procedures (including critical care time)  CRITICAL CARE Performed by: Raeford RazorStephen Katalyn Matin Total critical care time: 35 minutes Critical care time was exclusive of separately billable procedures and treating other patients. Critical care was necessary to treat or prevent imminent or life-threatening deterioration. Critical care was time spent personally by me on the following activities: development of treatment plan with patient and/or surrogate as well as nursing, discussions with consultants, evaluation of patient's response to treatment, examination of patient, obtaining history from patient or surrogate, ordering and performing treatments and interventions, ordering and review of laboratory studies, ordering and review of radiographic studies, pulse oximetry and re-evaluation of patient's condition.   Medications Ordered in ED Medications  furosemide (LASIX) injection 40 mg (has no administration in time range)  potassium chloride SA (K-DUR,KLOR-CON)  CR tablet 40 mEq (has no administration in time range)  furosemide (LASIX) injection 20 mg (20 mg Intravenous Given 06/20/18 1115)  potassium chloride SA (K-DUR,KLOR-CON) CR tablet 20 mEq (20 mEq Oral Given 06/20/18 1117)     Initial Impression / Assessment and Plan / ED Course  I have reviewed the triage vital signs and the nursing notes.  Pertinent labs & imaging results that were available during my care of the patient were reviewed by me and considered in my medical decision making (see chart for details).     [age] year old female with generalized weakness.  I suspect this is more from respiratory issue than anything else.  Hypoxic with increased work of breathing on EMS arrival.  She is doing very well on BiPAP.  See if we can transition her off.  Chest x-ray showing increased right-sided effusion. She is given a small dose of Lasix initially.  Repeated at a higher dose.  She denies any chest pain.  EKG is primarily paced.  Looks like underlying afib.  Initial troponin is normal.  Mild anemia which is stable and likely not contributory. She is DNR.   Final Clinical Impressions(s) / ED Diagnoses   Final diagnoses:  SOB (shortness of breath)        ED Discharge Orders    None       Raeford RazorKohut, Chrissie Dacquisto, MD 06/23/18 1557

## 2018-06-20 NOTE — ED Triage Notes (Signed)
Per EMS: Pt from [facility] with c/o not feeling well upon waking this am.  EMS reports 91% RA with bilateral rales in all lung fields, accessory muscle use, and increased wob. Family notes no recent illnesses or injury. Pt placed on CPAP prior to arrival.  EMS vitals: BP 132/78, O2 97% on CPAP.

## 2018-06-21 ENCOUNTER — Inpatient Hospital Stay (HOSPITAL_COMMUNITY): Payer: Medicare Other

## 2018-06-21 DIAGNOSIS — S81811A Laceration without foreign body, right lower leg, initial encounter: Secondary | ICD-10-CM

## 2018-06-21 DIAGNOSIS — K529 Noninfective gastroenteritis and colitis, unspecified: Secondary | ICD-10-CM

## 2018-06-21 LAB — CBC
HEMATOCRIT: 36.4 % (ref 36.0–46.0)
HEMOGLOBIN: 11.4 g/dL — AB (ref 12.0–15.0)
MCH: 29.4 pg (ref 26.0–34.0)
MCHC: 31.3 g/dL (ref 30.0–36.0)
MCV: 93.8 fL (ref 78.0–100.0)
Platelets: 197 10*3/uL (ref 150–400)
RBC: 3.88 MIL/uL (ref 3.87–5.11)
RDW: 14.6 % (ref 11.5–15.5)
WBC: 6.3 10*3/uL (ref 4.0–10.5)

## 2018-06-21 LAB — BASIC METABOLIC PANEL
ANION GAP: 9 (ref 5–15)
BUN: 13 mg/dL (ref 8–23)
CO2: 29 mmol/L (ref 22–32)
Calcium: 8.8 mg/dL — ABNORMAL LOW (ref 8.9–10.3)
Chloride: 100 mmol/L (ref 98–111)
Creatinine, Ser: 0.95 mg/dL (ref 0.44–1.00)
GFR calc non Af Amer: 53 mL/min — ABNORMAL LOW (ref >60)
GLUCOSE: 95 mg/dL (ref 70–99)
POTASSIUM: 3.7 mmol/L (ref 3.5–5.1)
Sodium: 138 mmol/L (ref 135–145)

## 2018-06-21 LAB — MAGNESIUM: Magnesium: 1.8 mg/dL (ref 1.7–2.4)

## 2018-06-21 LAB — PROTIME-INR
INR: 1.39
PROTHROMBIN TIME: 16.9 s — AB (ref 11.4–15.2)

## 2018-06-21 LAB — TSH: TSH: 2.535 u[IU]/mL (ref 0.350–4.500)

## 2018-06-21 LAB — PHOSPHORUS: PHOSPHORUS: 4.6 mg/dL (ref 2.5–4.6)

## 2018-06-21 MED ORDER — FUROSEMIDE 10 MG/ML IJ SOLN
20.0000 mg | Freq: Once | INTRAMUSCULAR | Status: AC
  Administered 2018-06-21: 20 mg via INTRAVENOUS
  Filled 2018-06-21: qty 2

## 2018-06-21 MED ORDER — WARFARIN SODIUM 2.5 MG PO TABS
4.5000 mg | ORAL_TABLET | Freq: Once | ORAL | Status: AC
  Administered 2018-06-21: 4.5 mg via ORAL
  Filled 2018-06-21: qty 1

## 2018-06-21 MED ORDER — LOPERAMIDE HCL 2 MG PO CAPS
4.0000 mg | ORAL_CAPSULE | Freq: Every day | ORAL | Status: DC
  Administered 2018-06-22 – 2018-06-24 (×2): 4 mg via ORAL
  Filled 2018-06-21 (×4): qty 2

## 2018-06-21 MED ORDER — DICYCLOMINE HCL 10 MG PO CAPS
10.0000 mg | ORAL_CAPSULE | Freq: Three times a day (TID) | ORAL | Status: DC
  Administered 2018-06-21 – 2018-06-24 (×10): 10 mg via ORAL
  Filled 2018-06-21 (×12): qty 1

## 2018-06-21 MED ORDER — BOOST / RESOURCE BREEZE PO LIQD CUSTOM
1.0000 | Freq: Three times a day (TID) | ORAL | Status: DC
  Administered 2018-06-21 – 2018-06-24 (×4): 1 via ORAL

## 2018-06-21 NOTE — Progress Notes (Signed)
Initial Nutrition Assessment  DOCUMENTATION CODES:   Severe malnutrition in context of chronic illness, Underweight  INTERVENTION:    Boost Breeze po TID, each supplement provides 250 kcal and 9 grams of protein  NUTRITION DIAGNOSIS:   Severe Malnutrition related to chronic illness(hx of stroke, A-fibrillation) as evidenced by severe fat depletion, severe muscle depletion  GOAL:   Patient will meet greater than or equal to 90% of their needs  MONITOR:   PO intake, Supplement acceptance, Labs, Skin, Weight trends, I & O's  REASON FOR ASSESSMENT:   Consult Assessment of nutrition requirement/status  ASSESSMENT:   84 y.o. Female with PMH significant of stroke, Afib, pacemaker, shingles who presents for SOB and weakness.  Pt admitted with SOB.   Pt known to Clinical Nutrition from previous hospitalization.  This RD briefly spoke with pt and family at bedside. Pt states she's eating well. Enjoyed breakfast.  Identified with severe malnutrition in 05/2018 which is ongoing. Family requested Boost Breeze upon RD visit. Brought for pt. Labs and medications reviewed. Mg, Phos, K+ WNL.  NUTRITION - FOCUSED PHYSICAL EXAM:    Most Recent Value  Orbital Region  Mild depletion  Upper Arm Region  Severe depletion  Thoracic and Lumbar Region  Unable to assess  Buccal Region  Mild depletion  Temple Region  Mild depletion  Clavicle Bone Region  Severe depletion  Clavicle and Acromion Bone Region  Severe depletion  Scapular Bone Region  Unable to assess  Dorsal Hand  Unable to assess  Patellar Region  Severe depletion  Anterior Thigh Region  Severe depletion  Posterior Calf Region  Severe depletion  Edema (RD Assessment)  None     Diet Order:   Diet Order           Diet Heart Room service appropriate? Yes; Fluid consistency: Thin  Diet effective now         EDUCATION NEEDS:   No education needs have been identified at this time  Skin:  Skin Assessment: Reviewed RN  Assessment  Last BM:  8/5  Height:   Ht Readings from Last 1 Encounters:  06/20/18 5\' 6"  (1.676 m)   Weight:   Wt Readings from Last 1 Encounters:  06/20/18 112 lb 10.5 oz (51.1 kg)   Wt Readings from Last 10 Encounters:  06/20/18 112 lb 10.5 oz (51.1 kg)  06/09/18 116 lb 10 oz (52.9 kg)  01/25/14 120 lb (54.4 kg)  01/25/13 127 lb (57.6 kg)  12/25/11 128 lb 1.9 oz (58.1 kg)  07/04/11 134 lb 3.2 oz (60.9 kg)  01/09/11 133 lb (60.3 kg)   BMI:  Body mass index is 18.18 kg/m.  Estimated Nutritional Needs:   Kcal:  1400-1600  Protein:  60-75 gm  Fluid:  >/= 1.5 L/day  Maureen ChattersKatie Chantrell Apsey, RD, LDN Pager #: [phone] After-Hours Pager #: [phone]

## 2018-06-21 NOTE — Care Management Note (Signed)
Case Management Note  Patient Details  Name: Joeseph AmorSara K Coffin MRN: [id number] Date of Birth: 26-May-1929  Subjective/Objective:  [facility],have pulmonary edema and worsening pleural effusion on the right on CXR, pedal edema.  Her BNP was 782. She is on coumadin for Afib and her INR was 1.5 which is subtherapeutic. Await pt/ot eval.                    Action/Plan: NCM will follow for transition of care needs.   Expected Discharge Date:                  Expected Discharge Plan:  Skilled Nursing Facility  In-House Referral:  Clinical Social Work  Discharge planning Services  CM Consult  Post Acute Care Choice:    Choice offered to:     DME Arranged:    DME Agency:     HH Arranged:    HH Agency:     Status of Service:  In process, will continue to follow  If discussed at Long Length of Stay Meetings, dates discussed:    Additional Comments:  Leone Havenaylor, Ramzi Brathwaite Clinton, RN 06/21/2018, 10:11 AM

## 2018-06-21 NOTE — Progress Notes (Signed)
ANTICOAGULATION CONSULT NOTE - Initial Consult  Pharmacy Consult for warfarin Indication: atrial fibrillation and hx stroke  Allergies  Allergen Reactions  . Codeine   . Enbrel [Etanercept]   . Fosamax [Alendronate Sodium]   . Hydromorphone   . Motrin [Ibuprofen]   . Sulfa Antibiotics Other (See Comments)    Childhood allergy   . Voltaren [Diclofenac]   . Naproxen Sodium Palpitations    Patient Measurements: Height: 5\' 6"  (167.6 cm) Weight: 112 lb 10.5 oz (51.1 kg) IBW/kg (Calculated) : 59.3  Vital Signs: Temp: 97.9 F (36.6 C) (08/05 0748) Temp Source: Oral (08/05 0748) BP: 130/72 (08/05 0748) Pulse Rate: 77 (08/05 0748)  Labs: Recent Labs    06/20/18 0945 06/21/18 0309  HGB 10.7* 11.4*  HCT 34.0* 36.4  PLT 192 197  LABPROT 18.0* 16.9*  INR 1.50 1.39  CREATININE 0.87 0.95  TROPONINI <0.03  --     Estimated Creatinine Clearance: 34.3 mL/min (by C-G formula based on SCr of 0.95 mg/dL).   Medical History: Past Medical History:  Diagnosis Date  . Atrial fibrillation (HCC)   . Chest pain   . History of shingles   . Long term current use of anticoagulant   . Pacemaker   . Stroke [facility](HCC)     Medications:  See electronic list once complete  Assessment: 84 y/o female who presents to the ED with SOB and not feeling well this morning. She takes warfarin PTA for Afib and hx stroke.  INR dropped down to 1.39 today. No bleeding noted, Hgb 11.4, platelets are normal. Will give another higher dose today.  PTA regimen: 3 mg daily except 2.5 mg MWF, last dose 8/3  Goal of Therapy:  INR 2-3 Monitor platelets by anticoagulation protocol: Yes   Plan:  Warfarin 4.5 mg PO tonight Daily INR Monitor for s/sx of bleeding   Ulyses SouthwardMinh Pham, PharmD, Suzan NailerBCIDP, AAHIVP, CPP Infectious Disease Pharmacist Pager: [phone] 06/21/2018 9:57 AM

## 2018-06-21 NOTE — Progress Notes (Signed)
PROGRESS NOTE    Carol Snyder  ZOX:[id number] DOB: 1929/03/02 DOA: 06/20/2018 PCP: Kaleen Mask, MD   Brief Narrative:  Carol Snyder is a 84 y.o. female with medical history significant of stroke (possibly per family), Afib, pacemaker, Shingles who presents for SOB and weakness.  Carol Snyder was recently admitted to the hospital for laceration of the right leg, syncope and dehydration.  She was discharged on 06/09/18.  At that time, her lasix was decreased from 40mg  daily to 20mg  MWF to help with her dehydration and passing out/falls.  She was noted to have a small right pleural effusion at the time of admission as well.  Today she presents with 1-2 days of weakness and not feeling well.  Today she had SOB, was in the 80s-low 90s on room air (not on oxygen) and had increased WOB so was brought to the ED.  She denied chest pain, fever, chills.  She may have had a bit of a runny nose, cough for 1 day.  She has been more sleepy and she did complain of a choking sensation and palpitations prior to admission.   In the ED, she was found to have pulmonary edema and worsening pleural effusion on the right on CXR.  She was noted to have pedal edema.  Her BNP was 782.  Na 134.  Cr 0.87. H/H mildly low which is chronic for her. She is on coumadin for Afib and her INR was 1.5 which is subtherapeutic.   Repeat CXR worsening so will get CT of Chest and will obtain Right Thoracentesis.   Assessment & Plan:   Active Problems:   Atrial fibrillation, chronic (HCC)   Protein-calorie malnutrition, severe   Laceration of right lower leg   Pleural effusion  Right sided Pleural effusion, SOB - Etiology is not clear, she has definitely had a decrease in her lasix which is playing a part.  Last TTE showed preserved EF, but diastolic parameters could not be determined given Afib.   -She could possibly be having rate related failure, but HR has been controlled since in the hospital.  -She did well and improved with  doses of lasix.   -She does not have any objective findings for pneumonia.   -She did have a cough of one day duration, but no fever, chills or sick contacts.  -If does not improved with diuresis alone, could consider thoracentesis with discussion with patient and family.  She has a history of smoking.  If improves with lasix, could consider outpatient CT scan of the chest.  - IV lasix total of 60mg  given in ED, weaned off of BIPAP - Lasix 20 mg daily started (increased to daily); Will give an additional Lasix dose IV  - I/O - Oxygen as needed - Repeat CXR in AM showed Increasing moderate right pleural effusion. New small left pleural effusion. Probable mild interstitial edema superimposed on chronic interstitial disease. Persistent cardiomegaly. Aortic Atherosclerosis (ICD10-I70.0). -Will order Right Thoracentesis and get a CT Chest    Atrial fibrillation, chronic  - Rate controlled, also on Amiodarone - Telemetry - Continue rate control with metoprolol and diltiazem - Check TSH; Not   - LFTs were okay on 7/23, add on hepatic panel to labs from today.  - Continue coumadin, INR subtherapeutic, pharmacy managed    Protein-calorie malnutrition, severe - Add on LFTs as noted above - Nutrition consult - Could be playing a part in volume status -Appreciate Nutritionist evaluation; Pateint was started on [employer]  po TID    Laceration of right lower leg - Appears to be healing, no erythema surrounding.   -Continue Monitor daily  Chronic Diarrhea -Seen and evaluated by [facility]; Felt it is related to Having Gallbladder removed and Colonic resection.  -Recent C Diff and GI Pathogen Panel Negative -Start Dicyclomine 10 mg po AC -C/w Lomitil and Imodium -If worsens or has severe electrolyte abnormalities will formally consult GI -Will inquire about whether patient can take Cholestyramine  DVT prophylaxis: Anticoagulated with Coumadin  Code Status: DO NOT RESUSCITATE Family  Communication: Discussed with Family at Bedside including daughter Disposition Plan: Remain Inpatient and anticipate D/C to SNF in next 24-48 hours if medically stable   Consultants:   Discussed Case with GI PA  Procedures: None   Antimicrobials:  Anti-infectives (From admission, onward)   None     Subjective:   Objective: Vitals:   06/20/18 2217 06/20/18 2337 06/21/18 0748 06/21/18 1618  BP: 117/69 114/69 130/72 103/61  Pulse:  93 77 79  Resp:   15 16  Temp: 98.1 F (36.7 C) 98 F (36.7 C) 97.9 F (36.6 C) 98.1 F (36.7 C)  TempSrc: Oral  Oral Oral  SpO2: 95% 94% 98% 95%  Weight:      Height:        Intake/Output Summary (Last 24 hours) at 06/21/2018 1825 Last data filed at 06/20/2018 2325 Gross per 24 hour  Intake 100 ml  Output 650 ml  Net -550 ml   Filed Weights   06/20/18 1636  Weight: 51.1 kg (112 lb 10.5 oz)   Examination: Physical Exam:  Constitutional: Thin underweight Caucasian female in NAD and appears calm Eyes: Lids and conjunctivae normal, sclerae anicteric  ENMT: External Ears, Nose appear normal. Grossly normal hearing. Mucous membranes are moist.  Neck: Appears normal, supple, no cervical masses, normal ROM, no appreciable thyromegaly, no JVD Respiratory: Diminished to auscultation bilaterally with bibasilar crackles and mild rhonchi. No appreciable rales or wheezing. Normal respiratory effort and patient is not tachypenic. No accessory muscle use.  Cardiovascular: Irregularly Irregular, no murmurs / rubs / gallops. S1 and S2 auscultated. No extremity edema.  Abdomen: Soft, non-tender, non-distended. No masses palpated. No appreciable hepatosplenomegaly. Bowel sounds positive x4.  GU: Deferred. Musculoskeletal: No clubbing / cyanosis of digits/nails. Good ROM, no contractures. Normal strength and muscle tone.  Skin: No rashes; Leg laceration is improving. No induration; Warm and dry.  Neurologic: CN 2-12 grossly intact with no focal deficits.  Romberg sign and cerebellar reflexes not assessed.  Psychiatric: Normal judgment and insight. Alert and oriented x 2. Normal mood and appropriate affect.   Data Reviewed: I have personally reviewed following labs and imaging studies  CBC: Recent Labs  Lab 06/20/18 0945 06/21/18 0309  WBC 5.5 6.3  NEUTROABS 3.6  --   HGB 10.7* 11.4*  HCT 34.0* 36.4  MCV 95.5 93.8  PLT 192 197   Basic Metabolic Panel: Recent Labs  Lab 06/20/18 0945 06/21/18 0309  NA 134* 138  K 4.4 3.7  CL 100 100  CO2 23 29  GLUCOSE 88 95  BUN 14 13  CREATININE 0.87 0.95  CALCIUM 8.7* 8.8*  MG  --  1.8  PHOS  --  4.6   GFR: Estimated Creatinine Clearance: 34.3 mL/min (by C-G formula based on SCr of 0.95 mg/dL). Liver Function Tests: Recent Labs  Lab 06/20/18 0945  AST 20  ALT 17  ALKPHOS 85  BILITOT 0.7  PROT 6.0*  ALBUMIN 3.2*  No results for input(s): LIPASE, AMYLASE in the last 168 hours. No results for input(s): AMMONIA in the last 168 hours. Coagulation Profile: Recent Labs  Lab 06/20/18 0945 06/21/18 0309  INR 1.50 1.39   Cardiac Enzymes: Recent Labs  Lab 06/20/18 0945  TROPONINI <0.03   BNP (last 3 results) No results for input(s): PROBNP in the last 8760 hours. HbA1C: No results for input(s): HGBA1C in the last 72 hours. CBG: No results for input(s): GLUCAP in the last 168 hours. Lipid Profile: No results for input(s): CHOL, HDL, LDLCALC, TRIG, CHOLHDL, LDLDIRECT in the last 72 hours. Thyroid Function Tests: Recent Labs    06/21/18 0309  TSH 2.535   Anemia Panel: No results for input(s): VITAMINB12, FOLATE, FERRITIN, TIBC, IRON, RETICCTPCT in the last 72 hours. Sepsis Labs: No results for input(s): PROCALCITON, LATICACIDVEN in the last 168 hours.  No results found for this or any previous visit (from the past 240 hour(s)).   Radiology Studies: Dg Chest Port 1 View  Result Date: 06/21/2018 CLINICAL DATA:  Shortness of breath and chest congestion. Pleural  effusion. EXAM: PORTABLE CHEST 1 VIEW COMPARISON:  Chest x-rays dated 06/20/2018, 06/05/2018, 08/18/2017, and chest CT dated 08/16/2017 FINDINGS: Interval increase in the moderate right pleural effusion. New small left pleural effusion. Chronic cardiomegaly. Chronic accentuation of the interstitial markings. However, the markings are more prominent on today's exam, probably representing slight interstitial edema. Aortic atherosclerosis.  Two lead pacemaker in place. No acute bone abnormality. Arthritic changes of both glenohumeral joints. IMPRESSION: 1. Increasing moderate right pleural effusion. 2. New small left pleural effusion. 3. Probable mild interstitial edema superimposed on chronic interstitial disease. 4. Persistent cardiomegaly. 5.  Aortic Atherosclerosis (ICD10-I70.0). Electronically Signed   By: Francene BoyersJames  Maxwell M.D.   On: 06/21/2018 10:01   Dg Chest Portable 1 View  Result Date: 06/20/2018 CLINICAL DATA:  Shortness of breath EXAM: PORTABLE CHEST 1 VIEW COMPARISON:  06/05/2017 FINDINGS: Cardiac shadow remains enlarged. Pacing device is again seen. Increasing right-sided pleural effusion right basilar atelectasis is noted. No acute bony abnormality is noted. IMPRESSION: Increasing right basilar atelectasis and right pleural effusion. Electronically Signed   By: Alcide CleverMark  Lukens M.D.   On: 06/20/2018 10:08   Scheduled Meds: . amiodarone  100 mg Oral Daily  . dicyclomine  10 mg Oral TID AC  . diltiazem  180 mg Oral Daily  . feeding supplement  1 Container Oral TID BM  . furosemide  20 mg Oral Daily  . loperamide  4 mg Oral Daily  . metoprolol tartrate  50 mg Oral BID  . warfarin  4.5 mg Oral ONCE-1800  . Warfarin - Pharmacist Dosing Inpatient   Does not apply q1800   Continuous Infusions:   LOS: 1 day   Merlene Laughtermair Latif Sheikh, DO [facility] Pager [phone]  If 7PM-7AM, please contact night-coverage [email or web address] Password [email or web address] 06/21/2018, 6:25 PM

## 2018-06-22 ENCOUNTER — Inpatient Hospital Stay (HOSPITAL_COMMUNITY): Payer: Medicare Other

## 2018-06-22 ENCOUNTER — Encounter (HOSPITAL_COMMUNITY): Payer: Self-pay | Admitting: Physician Assistant

## 2018-06-22 DIAGNOSIS — D649 Anemia, unspecified: Secondary | ICD-10-CM

## 2018-06-22 HISTORY — PX: IR THORACENTESIS ASP PLEURAL SPACE W/IMG GUIDE: IMG5380

## 2018-06-22 LAB — COMPREHENSIVE METABOLIC PANEL
ALT: 15 U/L (ref 0–44)
AST: 17 U/L (ref 15–41)
Albumin: 3.1 g/dL — ABNORMAL LOW (ref 3.5–5.0)
Alkaline Phosphatase: 82 U/L (ref 38–126)
Anion gap: 10 (ref 5–15)
BUN: 16 mg/dL (ref 8–23)
CHLORIDE: 96 mmol/L — AB (ref 98–111)
CO2: 30 mmol/L (ref 22–32)
CREATININE: 1.02 mg/dL — AB (ref 0.44–1.00)
Calcium: 8.6 mg/dL — ABNORMAL LOW (ref 8.9–10.3)
GFR calc non Af Amer: 48 mL/min — ABNORMAL LOW (ref >60)
GFR, EST AFRICAN AMERICAN: 56 mL/min — AB (ref >60)
Glucose, Bld: 96 mg/dL (ref 70–99)
POTASSIUM: 3.7 mmol/L (ref 3.5–5.1)
SODIUM: 136 mmol/L (ref 135–145)
Total Bilirubin: 0.9 mg/dL (ref 0.3–1.2)
Total Protein: 5.6 g/dL — ABNORMAL LOW (ref 6.5–8.1)

## 2018-06-22 LAB — CBC WITH DIFFERENTIAL/PLATELET
ABS IMMATURE GRANULOCYTES: 0 10*3/uL (ref 0.0–0.1)
BASOS ABS: 0.1 10*3/uL (ref 0.0–0.1)
BASOS PCT: 1 %
Eosinophils Absolute: 0.2 10*3/uL (ref 0.0–0.7)
Eosinophils Relative: 3 %
HCT: 34.6 % — ABNORMAL LOW (ref 36.0–46.0)
Hemoglobin: 11 g/dL — ABNORMAL LOW (ref 12.0–15.0)
IMMATURE GRANULOCYTES: 1 %
Lymphocytes Relative: 27 %
Lymphs Abs: 1.6 10*3/uL (ref 0.7–4.0)
MCH: 29.8 pg (ref 26.0–34.0)
MCHC: 31.8 g/dL (ref 30.0–36.0)
MCV: 93.8 fL (ref 78.0–100.0)
Monocytes Absolute: 0.8 10*3/uL (ref 0.1–1.0)
Monocytes Relative: 14 %
NEUTROS PCT: 54 %
Neutro Abs: 3.1 10*3/uL (ref 1.7–7.7)
PLATELETS: 192 10*3/uL (ref 150–400)
RBC: 3.69 MIL/uL — ABNORMAL LOW (ref 3.87–5.11)
RDW: 14.6 % (ref 11.5–15.5)
WBC: 5.9 10*3/uL (ref 4.0–10.5)

## 2018-06-22 LAB — PROTIME-INR
INR: 1.6
PROTHROMBIN TIME: 18.9 s — AB (ref 11.4–15.2)

## 2018-06-22 LAB — LACTOFERRIN, FECAL, QUALITATIVE: Lactoferrin, Fecal, Qual: POSITIVE — AB

## 2018-06-22 LAB — GRAM STAIN

## 2018-06-22 LAB — PROTEIN, PLEURAL OR PERITONEAL FLUID: Total protein, fluid: 3.1 g/dL

## 2018-06-22 LAB — BODY FLUID CELL COUNT WITH DIFFERENTIAL
EOS FL: 0 %
Lymphs, Fluid: 82 %
MONOCYTE-MACROPHAGE-SEROUS FLUID: 12 % — AB (ref 50–90)
Neutrophil Count, Fluid: 6 % (ref 0–25)
Total Nucleated Cell Count, Fluid: 1486 cu mm — ABNORMAL HIGH (ref 0–1000)

## 2018-06-22 LAB — LACTATE DEHYDROGENASE, PLEURAL OR PERITONEAL FLUID: LD FL: 77 U/L — AB (ref 3–23)

## 2018-06-22 LAB — MAGNESIUM: Magnesium: 1.8 mg/dL (ref 1.7–2.4)

## 2018-06-22 LAB — GLUCOSE, PLEURAL OR PERITONEAL FLUID: Glucose, Fluid: 108 mg/dL

## 2018-06-22 LAB — PHOSPHORUS: Phosphorus: 3.9 mg/dL (ref 2.5–4.6)

## 2018-06-22 MED ORDER — LIDOCAINE HCL (PF) 2 % IJ SOLN
INTRAMUSCULAR | Status: AC
  Filled 2018-06-22: qty 20

## 2018-06-22 MED ORDER — LIDOCAINE HCL (PF) 2 % IJ SOLN
INTRAMUSCULAR | Status: DC | PRN
  Administered 2018-06-22: 10 mL

## 2018-06-22 MED ORDER — WARFARIN SODIUM 3 MG PO TABS
3.0000 mg | ORAL_TABLET | Freq: Once | ORAL | Status: AC
  Administered 2018-06-22: 3 mg via ORAL
  Filled 2018-06-22: qty 1

## 2018-06-22 NOTE — Progress Notes (Signed)
PROGRESS NOTE    Carol Snyder  XBM:[id number] DOB: Mar 30, 1929 DOA: 06/20/2018 PCP: Kaleen Mask, MD   Brief Narrative:  Carol Snyder is a 84 y.o. female with medical history significant of stroke (possibly per family), Afib, pacemaker, Shingles who presents for SOB and weakness.  Carol Snyder was recently admitted to the hospital for laceration of the right leg, syncope and dehydration.  She was discharged on 06/09/18.  At that time, her lasix was decreased from 40mg  daily to 20mg  MWF to help with her dehydration and passing out/falls.  She was noted to have a small right pleural effusion at the time of admission as well.  Today she presents with 1-2 days of weakness and not feeling well.  Today she had SOB, was in the 80s-low 90s on room air (not on oxygen) and had increased WOB so was brought to the ED.  She denied chest pain, fever, chills.  She may have had a bit of a runny nose, cough for 1 day.  She has been more sleepy and she did complain of a choking sensation and palpitations prior to admission.   In the ED, she was found to have pulmonary edema and worsening pleural effusion on the right on CXR.  She was noted to have pedal edema.  Her BNP was 782.  Na 134.  Cr 0.87. H/H mildly low which is chronic for her. She is on coumadin for Afib and her INR was 1.5 which is subtherapeutic.   Repeat CXR worsening so will get CT of Chest and will obtain Right Thoracentesis. Patient had CT Scan done early this AM and showed moderate right and small left pleural effusion with compressive atelectasis along with a stable 5 mm middle lobe pulmonary nodule since 2018.  There is also subacute to chronic T8, T12, L1 and L2 superior endplate compression fractures with mild to moderate height loss.  Patient underwent thoracentesis today and ended up getting a pneumothorax which was 15% apically and repeat chest x-ray ordered for this afternoon.  Assessment & Plan:   Active Problems:   Atrial fibrillation,  chronic (HCC)   Protein-calorie malnutrition, severe   Laceration of right lower leg   Pleural effusion  Right sided Pleural effusion, SOB s/p Right Thoracentesis and now with Apical Pneumothorax  - Etiology was not clear, she has definitely had a decrease in her lasix which is playing a part.  Last TTE showed preserved EF, but diastolic parameters could not be determined given Afib.   -She could possibly be having rate related failure, but HR has been controlled since in the hospital.  -She did well and improved with doses of lasix but Plueral Effusion persisted  -She does not have any objective findings for pneumonia.   -She did have a cough of one day duration, but no fever, chills or sick contacts.  -Pleural Effusion not improved with Diuresis so CT Scan ordered -CT Scan of the Chest showed moderate right and small left pleural effusion with compressive atelectasis along with a stable 5 mm middle lobe pulmonary nodule since 2018.  There is also subacute to chronic T8, T12, L1 and L2 superior endplate compression fractures with mild to moderate height loss.   -Patient underwent thoracentesis today and ended up getting a pneumothorax which was 15% apically and repeat chest x-ray ordered for this afternoon - IV lasix total of 60mg  given in ED, weaned off of BIPAP - Lasix 20 mg daily started (increased to daily); Was given additional  Lasix dose IV yesterday afternoon - Strict I/O - Oxygen as needed - Repeat CXR in AM showed 15% right-sided pneumothorax post thoracentesis. There was also decreased pleural fluid volume on the right. The remainder of the chest appears stable - Follow up CXR ordered this Afternoon for 1500   Right Apical Pneumothorax from Right Paracentesis -See Above     Atrial fibrillation, chronic  - Rate controlled, also on Amiodarone - C/w Telemetry - Continue rate control with metoprolol and diltiazem - Checked TSH and was 2.535 - LFTs were okay on 7/23, add on hepatic  panel to labs from today.  - Continue coumadin, INR still subtherapeutic, pharmacy managed    Protein-calorie malnutrition, severe - Nutrition consult - Could be playing a part in volume status -Appreciate Nutritionist evaluation; Pateint was started on Boost Breeze po TID    Laceration of right lower leg - Appears to be healing, no erythema surrounding.   -Continue Monitor daily  Chronic Diarrhea -Seen and evaluated by [facility]; Felt it is related to Having Gallbladder removed and Colonic resection.  -Recent C Diff and GI Pathogen Panel Negative -Start Dicyclomine 10 mg po AC -C/w Lomitil and Imodium -If worsens or has severe electrolyte abnormalities will formally consult GI -Will inquire about whether patient can take Cholestyramine -Checked Lactoferrin and was positive   Normocytic Anemia -Patient's Hb/Hct went from 11.4/26.4 -> 11.0/34.6 -Continue to Monitor for S/Sx of Bleeding as Patient is Anticoagulated with Coumadin -Check Anemia Panel in AM -Repeat CBC in AM   Renal Insuffiencey -Mild -Likely worsened in the setting of Diuresis -Patient's BUN/Cr went from 14/0.87 -> 16/1.02 -Continue to Monitor and Repeat CMP in AM   DVT prophylaxis: Anticoagulated with Coumadin  Code Status: DO NOT RESUSCITATE Family Communication: Discussed with Family at Bedside  Disposition Plan: Remain Inpatient and anticipate D/C to SNF in next 24-48 hours if medically stable   Consultants:   Discussed Case with GI PA  Procedures:  Right Thoracentesis - Yielded 800 mL of Golden Yellow Fluid   Antimicrobials:  Anti-infectives (From admission, onward)   None     Subjective: Seen and examined at bedside and was concerned about going back to pending burn without being fully improved.  No chest pain still there is slightly.  No lightheadedness or dizziness.  Feels okay and still wearing oxygen.  No other concerns or complaints at this time  Objective: Vitals:   06/21/18 1618  06/21/18 2300 06/22/18 0742 06/22/18 0939  BP: 103/61 120/69 111/65   Pulse: 79 78 81 86  Resp: 16 16 20    Temp: 98.1 F (36.7 C) 98 F (36.7 C) (!) 97.5 F (36.4 C)   TempSrc: Oral Oral Oral   SpO2: 95% 96% 96% 93%  Weight:      Height:        Intake/Output Summary (Last 24 hours) at 06/22/2018 1439 Last data filed at 06/22/2018 0200 Gross per 24 hour  Intake 100 ml  Output 275 ml  Net -175 ml   Filed Weights   06/20/18 1636  Weight: 51.1 kg (112 lb 10.5 oz)   Examination: Physical Exam:  Constitutional: Thin underweight Caucasian female who is currently in no acute distress appears calm Eyes: Sclera anicteric.  Lids and conjunctive normal ENMT: External ears and nose appear normal.  Mucous membranes appear moist Neck: Appears supple with no JVD Respiratory: Diminished to auscultation bilaterally with bibasilar crackles and slight rhonchi which is improving.  No appreciable rales or wheezing.  Patient has  normal respiratory effort and is not tachypneic but is wearing supplemental oxygen via nasal cannula today Cardiovascular: Irregularly irregular.  No appreciable murmurs, rubs, gallops.  No lower extremity edema noted Abdomen: Soft, nontender, nondistended.  Bowel sounds present all 4 quadrants GU: Deferred Musculoskeletal: No contractures or cyanosis.  No joint deformities noted Skin: No appreciable rashes.  Has a leg laceration that is improving and dried scarring. Neurologic: Cranial nerves II through XII grossly intact no appreciable focal deficits Psychiatric: Patient is awake and alert.  Normal judgment and insight.  Normal mood and affect  Data Reviewed: I have personally reviewed following labs and imaging studies  CBC: Recent Labs  Lab 06/20/18 0945 06/21/18 0309 06/22/18 0326  WBC 5.5 6.3 5.9  NEUTROABS 3.6  --  3.1  HGB 10.7* 11.4* 11.0*  HCT 34.0* 36.4 34.6*  MCV 95.5 93.8 93.8  PLT 192 197 192   Basic Metabolic Panel: Recent Labs  Lab  06/20/18 0945 06/21/18 0309 06/22/18 0326  NA 134* 138 136  K 4.4 3.7 3.7  CL 100 100 96*  CO2 23 29 30   GLUCOSE 88 95 96  BUN 14 13 16   CREATININE 0.87 0.95 1.02*  CALCIUM 8.7* 8.8* 8.6*  MG  --  1.8 1.8  PHOS  --  4.6 3.9   GFR: Estimated Creatinine Clearance: 31.9 mL/min (A) (by C-G formula based on SCr of 1.02 mg/dL (H)). Liver Function Tests: Recent Labs  Lab 06/20/18 0945 06/22/18 0326  AST 20 17  ALT 17 15  ALKPHOS 85 82  BILITOT 0.7 0.9  PROT 6.0* 5.6*  ALBUMIN 3.2* 3.1*   No results for input(s): LIPASE, AMYLASE in the last 168 hours. No results for input(s): AMMONIA in the last 168 hours. Coagulation Profile: Recent Labs  Lab 06/20/18 0945 06/21/18 0309 06/22/18 0326  INR 1.50 1.39 1.60   Cardiac Enzymes: Recent Labs  Lab 06/20/18 0945  TROPONINI <0.03   BNP (last 3 results) No results for input(s): PROBNP in the last 8760 hours. HbA1C: No results for input(s): HGBA1C in the last 72 hours. CBG: No results for input(s): GLUCAP in the last 168 hours. Lipid Profile: No results for input(s): CHOL, HDL, LDLCALC, TRIG, CHOLHDL, LDLDIRECT in the last 72 hours. Thyroid Function Tests: Recent Labs    06/21/18 0309  TSH 2.535   Anemia Panel: No results for input(s): VITAMINB12, FOLATE, FERRITIN, TIBC, IRON, RETICCTPCT in the last 72 hours. Sepsis Labs: No results for input(s): PROCALCITON, LATICACIDVEN in the last 168 hours.  Recent Results (from the past 240 hour(s))  Gram stain     Status: None (Preliminary result)   Collection Time: 06/22/18 10:28 AM  Result Value Ref Range Status   Specimen Description FLUID PLEURAL RIGHT  Final   Special Requests NONE  Final   Gram Stain   Final    FEW WBC PRESENT, PREDOMINANTLY MONONUCLEAR NO ORGANISMS SEEN Performed at [facility], [address], [place], [place] [address]    Report Status PENDING  Incomplete     Radiology Studies: Dg Chest 1 View  Result Date: 06/22/2018 CLINICAL DATA:   Status post right-sided thoracentesis. EXAM: CHEST  1 VIEW COMPARISON:  Chest x-ray of earlier today FINDINGS: There is an approximately 15% right apical pneumothorax. There is no shift of the mediastinum. The volume of pleural fluid on the right has decreased. The cardiac silhouette remains enlarged. The left lung is mildly hyperinflated but stable. There is calcification in the wall of the aortic arch.  The ICD is in stable position. IMPRESSION: 15% right-sided pneumothorax post thoracentesis. These results were called by telephone at the time of interpretation on 06/22/2018 at 10:45 am to Philemon KingdomBill Simpson, RN, who verbally acknowledged these results. Decreased pleural fluid volume on the right. The remainder of the chest appears stable. Electronically Signed   By: David  SwazilandJordan M.D.   On: 06/22/2018 10:47   Ct Chest Wo Contrast  Result Date: 06/22/2018 CLINICAL DATA:  Pulmonary edema and worsening pleural effusion on the right. Pedal edema. EXAM: CT CHEST WITHOUT CONTRAST TECHNIQUE: Multidetector CT imaging of the chest was performed following the standard protocol without IV contrast. COMPARISON:  CXR 06/21/2018, CXR 06/05/2018, lumbar spine radiographs 03/29/2018 chest CT 08/16/2017 FINDINGS: Cardiovascular: Conventional branch pattern of the great vessels with atherosclerotic origins. Atherosclerosis of the nonaneurysmal thoracic aorta, mild-to-moderate. Cardiomegaly with left main and three-vessel coronary arteriosclerosis is identified. Left-sided pacemaker apparatus with right atrial and right ventricular leads are present. No pericardial effusion is seen. Stable 5 mm right middle lobe nodule. Mediastinum/Nodes: Probable reactive precarinal lymph node measuring 13 mm short axis. Hilar lymph nodes are difficult to assess given lack of IV contrast. Patent trachea and mainstem bronchi. CT appearance of the esophagus is unremarkable. Lungs/Pleura: Moderate right and small left pleural effusions with compressive  atelectasis similar to radiographic findings. Upper Abdomen: No acute abnormality. Musculoskeletal: Subacute to chronic stable superior endplate compressions of T8, T12, L1 and L2 since prior chest radiograph from 06/05/2018. IMPRESSION: 1. Moderate right and small left pleural effusions with compressive atelectasis at the right lung base. 2. Stable 5 mm right middle lobe pulmonary nodule since prior CT 2018. 3. Subacute to chronic T8, T12, L1 and L2 superior endplate compressions with mild-to-moderate height loss. These are stable dating back to 06/05/2018 chest radiographs. It appears that the compressions of the T12 and L2 superior endplates occurred sometime between the lumbar spine radiographs from 03/29/2018 and the chest radiograph of 06/05/2018. Aortic Atherosclerosis (ICD10-I70.0). Electronically Signed   By: Tollie Ethavid  Kwon M.D.   On: 06/22/2018 02:17   Dg Chest Port 1 View  Result Date: 06/22/2018 CLINICAL DATA:  Shortness of breath. EXAM: PORTABLE CHEST 1 VIEW COMPARISON:  One-view chest x-ray 06/21/2018 FINDINGS: The heart is enlarged. Atherosclerotic changes are present at the aorta. Aeration is improved in both lungs. Left pleural effusion is near completely resolved. Right pleural effusion remains. Right basilar airspace disease likely reflects atelectasis. The upper lung fields are clear. IMPRESSION: 1. Improved aeration of both lungs. 2. Near complete resolution of left pleural effusion with minimal left basilar atelectasis. 3. The right pleural effusion has decreased in size. Mild right atelectasis is present. Electronically Signed   By: Marin Robertshristopher  Mattern M.D.   On: 06/22/2018 07:36   Dg Chest Port 1 View  Result Date: 06/21/2018 CLINICAL DATA:  Shortness of breath and chest congestion. Pleural effusion. EXAM: PORTABLE CHEST 1 VIEW COMPARISON:  Chest x-rays dated 06/20/2018, 06/05/2018, 08/18/2017, and chest CT dated 08/16/2017 FINDINGS: Interval increase in the moderate right pleural  effusion. New small left pleural effusion. Chronic cardiomegaly. Chronic accentuation of the interstitial markings. However, the markings are more prominent on today's exam, probably representing slight interstitial edema. Aortic atherosclerosis.  Two lead pacemaker in place. No acute bone abnormality. Arthritic changes of both glenohumeral joints. IMPRESSION: 1. Increasing moderate right pleural effusion. 2. New small left pleural effusion. 3. Probable mild interstitial edema superimposed on chronic interstitial disease. 4. Persistent cardiomegaly. 5.  Aortic Atherosclerosis (ICD10-I70.0). Electronically Signed   By:  Francene Boyers M.D.   On: 06/21/2018 10:01   Scheduled Meds: . amiodarone  100 mg Oral Daily  . dicyclomine  10 mg Oral TID AC  . diltiazem  180 mg Oral Daily  . feeding supplement  1 Container Oral TID BM  . furosemide  20 mg Oral Daily  . lidocaine      . loperamide  4 mg Oral Daily  . metoprolol tartrate  50 mg Oral BID  . warfarin  3 mg Oral ONCE-1800  . Warfarin - Pharmacist Dosing Inpatient   Does not apply q1800   Continuous Infusions:   LOS: 2 days   Merlene Laughter, DO [facility] Pager [phone]  If 7PM-7AM, please contact night-coverage [email or web address] Password [facility] 06/22/2018, 2:39 PM

## 2018-06-22 NOTE — Evaluation (Signed)
Occupational Therapy Evaluation Patient Details Name: Joeseph AmorSara K Sainsbury MRN: [id number] DOB: 09-02-1929 Today's Date: 06/22/2018    History of Present Illness Mrs. Stone presents with 1-2 days of weakness and not feeling well.  Today she had SOB, was in the 80s-low 90s on room air (not on oxygen) and had increased WOB so was brought to the ED.  She denied chest pain, fever, chills.  She may have had a bit of a runny nose, cough for 1 day.  She has been more sleepy and she did complain of a choking sensation and palpitations prior to admission.    Clinical Impression   Pt currently needs min assist for toilet transfers and toileting as well as min to mod assist for LB selfcare sit to stand.  Oxygen sats 93% on 2Ls nasal cannula, decreasing to 98 % on room air with activity this session.  Feel pt will benefit from acute care OT to help increase overall independence with post acute SNF level therapy recommended as well.     Follow Up Recommendations  SNF;Supervision/Assistance - 24 hour    Equipment Recommendations  Other (comment)(TBD after discharge from SNF)    Recommendations for Other Services       Precautions / Restrictions Precautions Precautions: Fall Restrictions Weight Bearing Restrictions: No      Mobility Bed Mobility Overal bed mobility: Needs Assistance Bed Mobility: Supine to Sit     Supine to sit: Supervision;HOB elevated        Transfers Overall transfer level: Needs assistance Equipment used: Rolling walker (2 wheeled) Transfers: Sit to/from UGI CorporationStand;Stand Pivot Transfers Sit to Stand: Min assist Stand pivot transfers: Min assist            Balance Overall balance assessment: Needs assistance Sitting-balance support: Feet supported;No upper extremity supported Sitting balance-Leahy Scale: Good     Standing balance support: Bilateral upper extremity supported Standing balance-Leahy Scale: Poor Standing balance comment: Needs UE support for standing and  functional mobility.                           ADL either performed or assessed with clinical judgement   ADL Overall ADL's : Needs assistance/impaired Eating/Feeding: Independent   Grooming: Wash/dry hands;Wash/dry face;Minimal assistance   Upper Body Bathing: Supervision/ safety;Sitting   Lower Body Bathing: Moderate assistance;Sit to/from stand   Upper Body Dressing : Supervision/safety;Sitting   Lower Body Dressing: Minimal assistance;Sit to/from stand   Toilet Transfer: Minimal assistance;RW;Ambulation   Toileting- Clothing Manipulation and Hygiene: Minimal assistance;Sit to/from stand       Functional mobility during ADLs: Minimal assistance;Rolling walker General ADL Comments: Pt with O2 sats decreasing to 89% on room air during toileting.  Increased to 93% on 2Ls nasal cannula.  Slight decreased ability reaching her feet for donning gripper socks.  She reported using the reacher at the SNF to assist with this.  May benefit from sockaide and LH sponge as well.     Vision Baseline Vision/History: Wears glasses(Not present with her however) Wears Glasses: At all times Vision Assessment?: No apparent visual deficits            Pertinent Vitals/Pain Pain Assessment: No/denies pain     Hand Dominance Right   Extremity/Trunk Assessment Upper Extremity Assessment Upper Extremity Assessment: Overall WFL for tasks assessed   Lower Extremity Assessment Lower Extremity Assessment: Defer to PT evaluation   Cervical / Trunk Assessment Cervical / Trunk Assessment: Kyphotic   Communication  Cognition Arousal/Alertness: Awake/alert Behavior During Therapy: WFL for tasks assessed/performed Overall Cognitive Status: History of cognitive impairments - at baseline                                 General Comments: Pt able to follow commands with selfcare tasks without difficulty during session.     General Comments               Home  Living Family/patient expects to be discharged to:: Skilled nursing facility                                                 OT Problem List: Decreased strength;Impaired balance (sitting and/or standing);Cardiopulmonary status limiting activity;Decreased activity tolerance;Decreased knowledge of use of DME or AE      OT Treatment/Interventions: Self-care/ADL training;Therapeutic exercise;Energy conservation;DME and/or AE instruction;Therapeutic activities;Patient/family education;Balance training    OT Goals(Current goals can be found in the care plan section) Acute Rehab OT Goals Patient Stated Goal: To get back to walking  OT Goal Formulation: With patient Time For Goal Achievement: 07/06/18 Potential to Achieve Goals: Good  OT Frequency: Min 2X/week              AM-PAC PT "6 Clicks" Daily Activity     Outcome Measure Help from another person eating meals?: None Help from another person taking care of personal grooming?: A Little Help from another person toileting, which includes using toliet, bedpan, or urinal?: A Little Help from another person bathing (including washing, rinsing, drying)?: A Little Help from another person to put on and taking off regular upper body clothing?: A Little Help from another person to put on and taking off regular lower body clothing?: A Little 6 Click Score: 19   End of Session Equipment Utilized During Treatment: Rolling walker Nurse Communication: Mobility status;Other (comment)  Activity Tolerance: Patient tolerated treatment well Patient left: in bed;with call bell/phone within reach;with family/visitor present  OT Visit Diagnosis: Unsteadiness on feet (R26.81);Other abnormalities of gait and mobility (R26.89);Repeated falls (R29.6);Muscle weakness (generalized) (M62.81)                Time: 1610-9604 OT Time Calculation (min): 30 min Charges:  OT General Charges $OT Visit: 1 Visit OT Evaluation $OT Eval Moderate  Complexity: 1 Mod OT Treatments $Self Care/Home Management : 8-22 mins    Murel Wigle OTR/L 06/22/2018, 9:47 AM

## 2018-06-22 NOTE — Progress Notes (Signed)
Repeat CXR for post-thoracentesis apical pneumothorax remains stable - patient denies any worsening s/s. O2 sats remains stable on nasal cannula.  Continue supplemental O2, monitor for increased dyspnea, will repeat CXR in AM - if CXR remains without increase in pneumothorax and patient without worsening dyspnea IR to sign off.   Will follow up after repeat CXR tomorrow AM.   Lynnette CaffeyShannon Kristeena Meineke, PA-C 06/22/18 1615

## 2018-06-22 NOTE — Progress Notes (Signed)
Mag level 1.8 reported to [facility] on call.

## 2018-06-22 NOTE — Progress Notes (Signed)
ANTICOAGULATION CONSULT NOTE - Initial Consult  Pharmacy Consult for warfarin Indication: atrial fibrillation and hx stroke  Allergies  Allergen Reactions  . Codeine   . Enbrel [Etanercept]   . Fosamax [Alendronate Sodium]   . Hydromorphone   . Motrin [Ibuprofen]   . Sulfa Antibiotics Other (See Comments)    Childhood allergy   . Voltaren [Diclofenac]   . Naproxen Sodium Palpitations    Patient Measurements: Height: 5\' 6"  (167.6 cm) Weight: 112 lb 10.5 oz (51.1 kg) IBW/kg (Calculated) : 59.3  Vital Signs: Temp: 97.5 F (36.4 C) (08/06 0742) Temp Source: Oral (08/06 0742) BP: 111/65 (08/06 0742) Pulse Rate: 81 (08/06 0742)  Labs: Recent Labs    06/20/18 0945 06/21/18 0309 06/22/18 0326  HGB 10.7* 11.4* 11.0*  HCT 34.0* 36.4 34.6*  PLT 192 197 192  LABPROT 18.0* 16.9* 18.9*  INR 1.50 1.39 1.60  CREATININE 0.87 0.95 1.02*  TROPONINI <0.03  --   --     Estimated Creatinine Clearance: 31.9 mL/min (A) (by C-G formula based on SCr of 1.02 mg/dL (H)).   Medical History: Past Medical History:  Diagnosis Date  . Atrial fibrillation (HCC)   . Chest pain   . History of shingles   . Long term current use of anticoagulant   . Pacemaker   . Stroke [facility](HCC)     Medications:  See electronic list once complete  Assessment: 84 y/o female who presents to the ED with SOB and not feeling well this morning. She takes warfarin PTA for Afib and hx stroke.  INR is trending back up today. No bleeding noted, Hgb 11, platelets are normal. Will try to home dose today.   PTA regimen: 3 mg daily except 2.5 mg MWF, last dose 8/3  Goal of Therapy:  INR 2-3 Monitor platelets by anticoagulation protocol: Yes   Plan:  Warfarin 3 mg PO tonight Daily INR Monitor for s/sx of bleeding   Ulyses SouthwardMinh Jina Olenick, PharmD, Suzan NailerBCIDP, AAHIVP, CPP Infectious Disease Pharmacist Pager: [phone] 06/22/2018 8:52 AM

## 2018-06-22 NOTE — Progress Notes (Signed)
Dr. Marland McalpineSheikh paged the following at 1048:  "2W08:Vana.This nurse notified by radiologist that a 15% pnemothorax is present on xray post thoracentesis. pt. not back to floor yet. "

## 2018-06-22 NOTE — Procedures (Signed)
PROCEDURE SUMMARY:  Successful image-guided right thoracentesis. Yielded 800 milliliters of golden yellow fluid. Patient tolerated procedure well. No immediate complications.  Specimen was sent for labs.  CXR ordered post-procedure - 15% apical pneumothorax noted; patient to remain on continuous O2, repeat CXR ordered for 1500 today to assess for improvment. Continue to monitor for worsening dsypnea.   Villa HerbShannon A Watterson PA-C 06/22/2018 10:40 AM

## 2018-06-22 NOTE — Evaluation (Signed)
Physical Therapy Evaluation Patient Details Name: Carol Snyder MRN: [id number] DOB: 12-Aug-1929 Today's Date: 06/22/2018   History of Present Illness  Pt is an 84 y.o. female admitted from SNF on 06/20/18 with wweakness, SOB and hypoxia. CXR showed pulmonary edema and worsening R pleural effusion. S/p thoracentesis 8/6; xray showed 15% pnemothorax post-thoracentesis. PMH includes CVA, a-fib, pacemaker, Shingles.    Clinical Impression  Pt presents with an overall decrease in functional mobility secondary to above. PTA, pt receiving PT/OT at SNF since recent admission; prior to this, she was living alone and ambulatory with RW. Today, pt able to transfer and amb short distance with RW and min guard for balance; limited by generalized weakness and decreased activity tolerance. DOE 2/4; SpO2 >90% on 2L O2 [place] throughout. Remains at increased risk for falls. Pt would benefit from continued acute PT services to maximize functional mobility and independence prior to d/c with continued SNF-level therapies.     Follow Up Recommendations SNF;Supervision for mobility/OOB    Equipment Recommendations  None recommended by PT    Recommendations for Other Services       Precautions / Restrictions Precautions Precautions: Fall Precaution Comments: Small pneumothorax post-thoracentesis 8/6 (monitor SpO2) Restrictions Weight Bearing Restrictions: No      Mobility  Bed Mobility Overal bed mobility: Needs Assistance Bed Mobility: Supine to Sit     Supine to sit: Supervision;HOB elevated        Transfers Overall transfer level: Needs assistance Equipment used: Rolling walker (2 wheeled) Transfers: Sit to/from Stand Sit to Stand: Min assist;Min guard Stand pivot transfers: Min assist       General transfer comment: Initial minA to maintain balance and steady RW. Performed 5x more repeated sit<>stands with RW, progressing to min guard for balance; heavy reliance on UE support to push into  standing  Ambulation/Gait Ambulation/Gait assistance: Min guard Gait Distance (Feet): 15 Feet Assistive device: Rolling walker (2 wheeled) Gait Pattern/deviations: Step-through pattern;Trunk flexed;Decreased stride length Gait velocity: Decreased Gait velocity interpretation: <1.8 ft/sec, indicate of risk for recurrent falls General Gait Details: Slow, guarded amb with significant forward flexed posture; DOE 2/4. SpO2 90% on 2L O2 [place]  Stairs            Wheelchair Mobility    Modified Rankin (Stroke Patients Only)       Balance Overall balance assessment: Needs assistance Sitting-balance support: Feet supported;No upper extremity supported Sitting balance-Leahy Scale: Good     Standing balance support: Bilateral upper extremity supported Standing balance-Leahy Scale: Poor Standing balance comment: Reliant on UE support                             Pertinent Vitals/Pain Pain Assessment: No/denies pain    Home Living Family/patient expects to be discharged to:: Skilled nursing facility Living Arrangements: Alone Available Help at Discharge: Family;Available PRN/intermittently Type of Home: House Home Access: Stairs to enter   Entrance Stairs-Number of Steps: 3 Home Layout: One level Home Equipment: Walker - 2 wheels;Walker - 4 wheels;Cane - single point;Wheelchair - manual;Bedside commode Additional Comments: pt tub/shower and toilet are in separate bathrooms;bathroom with toilet is not accessible via walker;bathroom with tub/shower is accessible via walker    Prior Function Level of Independence: Needs assistance   Gait / Transfers Assistance Needed: Ambulating with RW at SNF. Has had a couple "spells" of near falls with higher level balance (i.e. reaching for object on floor)  ADL's / Homemaking Assistance Needed:  Assist for ADLs        Hand Dominance   Dominant Hand: Right    Extremity/Trunk Assessment   Upper Extremity Assessment Upper  Extremity Assessment: Overall WFL for tasks assessed    Lower Extremity Assessment Lower Extremity Assessment: Generalized weakness    Cervical / Trunk Assessment Cervical / Trunk Assessment: Kyphotic  Communication   Communication: No difficulties  Cognition Arousal/Alertness: Awake/alert Behavior During Therapy: WFL for tasks assessed/performed Overall Cognitive Status: History of cognitive impairments - at baseline                                 General Comments: Following commands and interacting appropriately.       General Comments General comments (skin integrity, edema, etc.): Daughter's friend present throughout session    Exercises     Assessment/Plan    PT Assessment Patient needs continued PT services  PT Problem List Decreased strength;Decreased activity tolerance;Decreased balance;Decreased mobility;Pain;Decreased coordination;Cardiopulmonary status limiting activity       PT Treatment Interventions DME instruction;Stair training;Functional mobility training;Therapeutic activities;Therapeutic exercise;Balance training;Gait training;Neuromuscular re-education;Patient/family education    PT Goals (Current goals can be found in the Care Plan section)  Acute Rehab PT Goals Patient Stated Goal: Return to independence and living at home PT Goal Formulation: With patient Time For Goal Achievement: 07/06/18 Potential to Achieve Goals: Good    Frequency Min 2X/week   Barriers to discharge        Co-evaluation               AM-PAC PT "6 Clicks" Daily Activity  Outcome Measure Difficulty turning over in bed (including adjusting bedclothes, sheets and blankets)?: None Difficulty moving from lying on back to sitting on the side of the bed? : None Difficulty sitting down on and standing up from a chair with arms (e.g., wheelchair, bedside commode, etc,.)?: Unable Help needed moving to and from a bed to chair (including a wheelchair)?: A  Little Help needed walking in hospital room?: A Little Help needed climbing 3-5 steps with a railing? : A Lot 6 Click Score: 17    End of Session Equipment Utilized During Treatment: Gait belt;Oxygen Activity Tolerance: Patient tolerated treatment well Patient left: in chair;with call bell/phone within reach;with family/visitor present Nurse Communication: Mobility status PT Visit Diagnosis: Unsteadiness on feet (R26.81);Muscle weakness (generalized) (M62.81);Difficulty in walking, not elsewhere classified (R26.2)    Time: 1610-9604 PT Time Calculation (min) (ACUTE ONLY): 24 min   Charges:   PT Evaluation $PT Eval Moderate Complexity: 1 Mod PT Treatments $Therapeutic Activity: 8-22 mins      Ina Homes, PT, DPT Acute Rehab Services  Pager: [phone]  Carol Snyder 06/22/2018, 11:41 AM

## 2018-06-23 ENCOUNTER — Inpatient Hospital Stay (HOSPITAL_COMMUNITY): Payer: Medicare Other

## 2018-06-23 DIAGNOSIS — E43 Unspecified severe protein-calorie malnutrition: Secondary | ICD-10-CM

## 2018-06-23 DIAGNOSIS — J939 Pneumothorax, unspecified: Secondary | ICD-10-CM

## 2018-06-23 DIAGNOSIS — I482 Chronic atrial fibrillation: Secondary | ICD-10-CM

## 2018-06-23 DIAGNOSIS — J90 Pleural effusion, not elsewhere classified: Secondary | ICD-10-CM

## 2018-06-23 LAB — COMPREHENSIVE METABOLIC PANEL
ALT: 12 U/L (ref 0–44)
AST: 16 U/L (ref 15–41)
Albumin: 3 g/dL — ABNORMAL LOW (ref 3.5–5.0)
Alkaline Phosphatase: 79 U/L (ref 38–126)
Anion gap: 10 (ref 5–15)
BUN: 23 mg/dL (ref 8–23)
CO2: 31 mmol/L (ref 22–32)
CREATININE: 0.94 mg/dL (ref 0.44–1.00)
Calcium: 8.6 mg/dL — ABNORMAL LOW (ref 8.9–10.3)
Chloride: 98 mmol/L (ref 98–111)
GFR, EST NON AFRICAN AMERICAN: 53 mL/min — AB (ref >60)
Glucose, Bld: 98 mg/dL (ref 70–99)
POTASSIUM: 4.1 mmol/L (ref 3.5–5.1)
SODIUM: 139 mmol/L (ref 135–145)
Total Bilirubin: 0.8 mg/dL (ref 0.3–1.2)
Total Protein: 5.4 g/dL — ABNORMAL LOW (ref 6.5–8.1)

## 2018-06-23 LAB — CBC WITH DIFFERENTIAL/PLATELET
Abs Immature Granulocytes: 0 10*3/uL (ref 0.0–0.1)
BASOS ABS: 0.1 10*3/uL (ref 0.0–0.1)
BASOS PCT: 1 %
EOS ABS: 0.2 10*3/uL (ref 0.0–0.7)
Eosinophils Relative: 2 %
HCT: 35 % — ABNORMAL LOW (ref 36.0–46.0)
Hemoglobin: 10.7 g/dL — ABNORMAL LOW (ref 12.0–15.0)
IMMATURE GRANULOCYTES: 1 %
Lymphocytes Relative: 22 %
Lymphs Abs: 1.7 10*3/uL (ref 0.7–4.0)
MCH: 29.3 pg (ref 26.0–34.0)
MCHC: 30.6 g/dL (ref 30.0–36.0)
MCV: 95.9 fL (ref 78.0–100.0)
MONO ABS: 1 10*3/uL (ref 0.1–1.0)
Monocytes Relative: 12 %
NEUTROS ABS: 4.9 10*3/uL (ref 1.7–7.7)
NEUTROS PCT: 62 %
PLATELETS: 198 10*3/uL (ref 150–400)
RBC: 3.65 MIL/uL — ABNORMAL LOW (ref 3.87–5.11)
RDW: 14.6 % (ref 11.5–15.5)
WBC: 7.9 10*3/uL (ref 4.0–10.5)

## 2018-06-23 LAB — PHOSPHORUS: PHOSPHORUS: 3 mg/dL (ref 2.5–4.6)

## 2018-06-23 LAB — ACID FAST SMEAR (AFB, MYCOBACTERIA): Acid Fast Smear: NEGATIVE

## 2018-06-23 LAB — PH, BODY FLUID: pH, Body Fluid: 7.7

## 2018-06-23 LAB — PROTIME-INR
INR: 1.82
PROTHROMBIN TIME: 20.9 s — AB (ref 11.4–15.2)

## 2018-06-23 LAB — LACTATE DEHYDROGENASE: LDH: 149 U/L (ref 98–192)

## 2018-06-23 LAB — MAGNESIUM: MAGNESIUM: 1.9 mg/dL (ref 1.7–2.4)

## 2018-06-23 LAB — ACID FAST SMEAR (AFB)

## 2018-06-23 MED ORDER — POTASSIUM CHLORIDE CRYS ER 20 MEQ PO TBCR
20.0000 meq | EXTENDED_RELEASE_TABLET | Freq: Once | ORAL | Status: AC
  Administered 2018-06-23: 20 meq via ORAL
  Filled 2018-06-23: qty 1

## 2018-06-23 MED ORDER — FUROSEMIDE 10 MG/ML IJ SOLN
20.0000 mg | Freq: Once | INTRAMUSCULAR | Status: AC
  Administered 2018-06-23: 20 mg via INTRAVENOUS
  Filled 2018-06-23: qty 2

## 2018-06-23 MED ORDER — WARFARIN SODIUM 3 MG PO TABS
3.0000 mg | ORAL_TABLET | Freq: Once | ORAL | Status: AC
Start: 2018-06-23 — End: 2018-06-23
  Administered 2018-06-23: 3 mg via ORAL
  Filled 2018-06-23: qty 1

## 2018-06-23 NOTE — Progress Notes (Signed)
Occupational Therapy Treatment Patient Details Name: Carol Snyder MRN: [id number] DOB: 1929/02/19 Today's Date: 06/23/2018    History of present illness Pt is an 84 y.o. female admitted from SNF on 06/20/18 with weakness, SOB and hypoxia. CXR showed pulmonary edema and worsening R pleural effusion. S/p thoracentesis 8/6; xray showed 15% pnemothorax post-thoracentesis. PMH includes CVA, a-fib, pacemaker, Shingles.   OT comments  Pt reports feeling well. Immediately willing to work with OT. Pt performed toileting, standing grooming and ambulated in room x 2 laps with RW and min guard assist. Pt maintained 02 sats at 96-97% on RA while ambulating and talking. Pt is eager to return to rehab and progress to home.  Follow Up Recommendations  SNF;Supervision/Assistance - 24 hour    Equipment Recommendations       Recommendations for Other Services      Precautions / Restrictions Precautions Precautions: Fall Precaution Comments: monitor 02       Mobility Bed Mobility Overal bed mobility: Needs Assistance Bed Mobility: Supine to Sit;Sit to Supine     Supine to sit: Min assist;HOB elevated Sit to supine: Supervision   General bed mobility comments: pulled up on therapist's hand to raise trunk  Transfers Overall transfer level: Needs assistance Equipment used: Rolling walker (2 wheeled) Transfers: Sit to/from Stand Sit to Stand: Min guard         General transfer comment: for safety, good technique    Balance Overall balance assessment: Needs assistance   Sitting balance-Leahy Scale: Good       Standing balance-Leahy Scale: Poor Standing balance comment: Reliant on UE support                           ADL either performed or assessed with clinical judgement   ADL       Grooming: Wash/dry hands;Standing;Min guard           Upper Body Dressing : Set up;Sitting       Toilet Transfer: Min guard;Ambulation;RW;BSC   Toileting- Clothing Manipulation  and Hygiene: Sit to/from stand;Min guard       Functional mobility during ADLs: Min guard;Rolling walker General ADL Comments: pt maintaining 02 sats at 97% with ambulation in room on RA, HR 88.     Vision       Perception     Praxis      Cognition Arousal/Alertness: Awake/alert Behavior During Therapy: WFL for tasks assessed/performed Overall Cognitive Status: History of cognitive impairments - at baseline                                 General Comments: repeats herself, impaired memory        Exercises     Shoulder Instructions       General Comments      Pertinent Vitals/ Pain       Pain Assessment: No/denies pain  Home Living                                          Prior Functioning/Environment              Frequency  Min 2X/week        Progress Toward Goals  OT Goals(current goals can now be found in the care plan section)  Progress towards OT  goals: Progressing toward goals  Acute Rehab OT Goals Patient Stated Goal: Return to independence and living at home Time For Goal Achievement: 07/06/18 Potential to Achieve Goals: Good  Plan Discharge plan remains appropriate    Co-evaluation                 AM-PAC PT "6 Clicks" Daily Activity     Outcome Measure   Help from another person eating meals?: None Help from another person taking care of personal grooming?: A Little Help from another person toileting, which includes using toliet, bedpan, or urinal?: A Little Help from another person bathing (including washing, rinsing, drying)?: A Little Help from another person to put on and taking off regular upper body clothing?: None Help from another person to put on and taking off regular lower body clothing?: A Little 6 Click Score: 20    End of Session Equipment Utilized During Treatment: Rolling walker;Gait belt  OT Visit Diagnosis: Unsteadiness on feet (R26.81);Other abnormalities of gait and  mobility (R26.89);Repeated falls (R29.6);Muscle weakness (generalized) (M62.81)   Activity Tolerance Patient tolerated treatment well   Patient Left in bed;with call bell/phone within reach;with bed alarm set   Nurse Communication Other (comment)(02 sats)        Time: [id number] OT Time Calculation (min): 22 min  Charges: OT General Charges $OT Visit: 1 Visit OT Treatments $Self Care/Home Management : 8-22 mins  06/23/2018 Martie RoundJulie Sierah Lacewell, OTR/L Pager: [phone]   Iran PlanasMayberry, [place] BailiffJulie Lynn 06/23/2018, 3:17 PM

## 2018-06-23 NOTE — Clinical Social Work Note (Signed)
Clinical Social Work Assessment  Patient Details  Name: Carol Snyder MRN: [id number] Date of Birth: 1929-11-08  Date of referral:  06/22/18               Reason for consult:  Facility Placement                Permission sought to share information with:  Chartered certified accountant granted to share information::  Yes, Verbal Permission Granted  Name::     Engineer, civil (consulting)::  SNFs  Relationship::  dtr  Contact Information:     Housing/Transportation Living arrangements for the past 2 months:  Single Family Home Source of Information:  Patient, Adult Children Patient Interpreter Needed:  None Criminal Activity/Legal Involvement Pertinent to Current Situation/Hospitalization:    Significant Relationships:  Adult Children Lives with:  Self Do you feel safe going back to the place where you live?  No Need for family participation in patient care:  No (Coment)  Care giving concerns:  Pt lives at home alone- currently too weak to be safe returning home.   Social Worker assessment / plan:  CSW met with pt and pt dtr at bedside to discuss DC plan.  Pt and pt dtr already planning on pt going to SNF and has bed hold at [facility].  Employment status:  Retired Nurse, adult PT Recommendations:  [place] / Referral to community resources:  [place]  Patient/Family's Response to care:  Agreeable to SNF and already set up at [facility].  Patient/Family's Understanding of and Emotional Response to Diagnosis, Current Treatment, and Prognosis:  Good understanding of pt condition and need for continued rehab.  Emotional Assessment Appearance:  Appears stated age Attitude/Demeanor/Rapport:    Affect (typically observed):  Appropriate Orientation:  Oriented to Self, Oriented to Place, Oriented to  Time, Oriented to Situation Alcohol / Substance use:  Not Applicable Psych involvement (Current and /or in the  community):  No (Comment)  Discharge Needs  Concerns to be addressed:  Care Coordination Readmission within the last 30 days:  Yes Current discharge risk:  Physical Impairment Barriers to Discharge:  Continued Medical Work up   Jorge Ny, LCSW 06/23/2018, 2:45 PM

## 2018-06-23 NOTE — Progress Notes (Signed)
ANTICOAGULATION CONSULT NOTE - Initial Consult  Pharmacy Consult for warfarin Indication: atrial fibrillation and hx stroke  Allergies  Allergen Reactions  . Codeine   . Enbrel [Etanercept]   . Fosamax [Alendronate Sodium]   . Hydromorphone   . Motrin [Ibuprofen]   . Sulfa Antibiotics Other (See Comments)    Childhood allergy   . Voltaren [Diclofenac]   . Naproxen Sodium Palpitations    Patient Measurements: Height: 5\' 6"  (167.6 cm) Weight: 112 lb 10.5 oz (51.1 kg) IBW/kg (Calculated) : 59.3  Vital Signs: Temp: 98.1 F (36.7 C) (08/07 0741) Temp Source: Oral (08/07 0741) BP: 122/70 (08/07 0741) Pulse Rate: 81 (08/07 0741)  Labs: Recent Labs    06/21/18 0309 06/22/18 0326 06/23/18 0359  HGB 11.4* 11.0* 10.7*  HCT 36.4 34.6* 35.0*  PLT 197 192 198  LABPROT 16.9* 18.9* 20.9*  INR 1.39 1.60 1.82  CREATININE 0.95 1.02* 0.94    Estimated Creatinine Clearance: 34.7 mL/min (by C-G formula based on SCr of 0.94 mg/dL).   Medical History: Past Medical History:  Diagnosis Date  . Atrial fibrillation (HCC)   . Chest pain   . History of shingles   . Long term current use of anticoagulant   . Pacemaker   . Stroke [facility](HCC)     Medications:  See electronic list once complete  Assessment: 84 y/o female who presents to the ED with SOB and not feeling well this morning. She takes warfarin PTA for Afib and hx stroke.  INR is trending back up today but still subtherapeutic at 1.82. . No bleeding noted, Hgb 11, platelets are normal.   PTA regimen: 3 mg daily except 2.5 mg MWF, last dose 8/3  Goal of Therapy:  INR 2-3 Monitor platelets by anticoagulation protocol: Yes   Plan:  Warfarin 3 mg PO tonight Daily INR Monitor for s/sx of bleeding   Ulyses SouthwardMinh Melizza Kanode, PharmD, Suzan NailerBCIDP, AAHIVP, CPP Infectious Disease Pharmacist Pager: [phone] 06/23/2018 9:57 AM

## 2018-06-23 NOTE — Progress Notes (Signed)
PROGRESS NOTE    Carol Snyder  UUV:[id number] DOB: July 27, 1929 DOA: 06/20/2018 PCP: Kaleen Mask, MD   Brief Narrative:  Carol Snyder is a 84 y.o. female with medical history significant andfor CVA, paroxysmal atrial fibrillation, dual-chamber pacemaker, orthostatic hypotension, chronic diastolic CHF, chronic diarrhea, Shingles who presented for SOB and weakness.  Carol Snyder was recently admitted to the hospital for laceration of the right leg, syncope and dehydration.  She was discharged on 06/09/18.  At that time, her lasix was decreased from 40mg  daily to 20mg  MWF to help with her dehydration and passing out/falls.  She was noted to have a small right pleural effusion at the time of admission as well. -on admission she presented with 1-2 days of weakness, worsening dyspnea and hypoxia in ED she was found to have pulmonary edema worsening pleural effusion . -Repeat CXR worsening, CT chest done noted moderate right and small left pleural effusion and 5 mm pulmonary nodule ,  also subacute to chronic T8, T12, L1 and L2 superior endplate compression fractures with mild to moderate height loss.   -Patient underwent thoracentesis 8/6 with 800 mL straw-colored fluid drained and ended up getting a small pneumothorax which was 15% apically  Assessment & Plan:   Bilateral pleural effusions, R>L -Due to chronic diastolic CHF, initially diuresed with IV Lasix since pleural effusion persisted to some extent underwent thoracentesis - status post thoracentesis 8/6, with drained,  fluid is transudate by Light's criteria, cultures negative -complicated by small apical pneumothorax -Repeat chest x-ray today -Clinically appears euvolemic, continue by mouth Lasix for now, diuresis limited by soft BPs and chronic orthostatic hypotension and chronic diarrhea  Chronic diastolic CHF -See above, aggressive diuresis limited by chronic orthostatic hypotension and diarrhea -follow-up with cardiologist Dr. Sandy Salaam in [facility]  Small apical pneumothorax -following thoracentesis yesterday, will get follow-up x-ray, she is asymptomatic, monitor    Atrial fibrillation, chronic  - Rate controlled, also on Amiodarone - Continue rate control with metoprolol and diltiazem -continue Coumadin per pharmacy    Protein-calorie malnutrition, severe - Nutrition consult appreciated, continue boost    Laceration of right lower leg - Appears to be healing, no erythema surrounding.   -Continue Monitor daily  Chronic Diarrhea -Seen and evaluated by [facility]; Felt it was secondary to postcholecystectomy and Colonic resection.  -Recent C Diff and GI Pathogen Panel Negative -Start Dicyclomine 10 mg po AC -C/w Lomitil and Imodium -follow-up would gastroenterology  Normocytic Anemia -stable, monitor  DVT prophylaxis: Anticoagulated with Coumadin  Code Status: DO NOT RESUSCITATE Family Communication: daughter at bedside Disposition Plan: SNF tomorrow if stable  Consultants:   Discussed Case with GI PA  Interventional radiology  Procedures:  Right Thoracentesis 8/6- Yielded 800 mL of Golden Yellow Fluid   Antimicrobials:  Anti-infectives (From admission, onward)   None     Subjective:  -no complaints, breathing better  Objective: Vitals:   06/22/18 1632 06/22/18 2111 06/22/18 2113 06/23/18 0741  BP: 102/61 114/60 114/60 122/70  Pulse: 68 92 81 81  Resp: 20  18 17   Temp: 98.3 F (36.8 C)  98.3 F (36.8 C) 98.1 F (36.7 C)  TempSrc: Oral  Oral Oral  SpO2: 100%  99% 99%  Weight:      Height:        Intake/Output Summary (Last 24 hours) at 06/23/2018 1259 Last data filed at 06/22/2018 1600 Gross per 24 hour  Intake 300 ml  Output -  Net 300 ml  Filed Weights   06/20/18 1636  Weight: 51.1 kg (112 lb 10.5 oz)   Examination: Physical Exam: Gen: Awake, Alert, Oriented X 3,  Thinly built female,Sitting in bed, no distress HEENT: PERRLA, Neck supple, no JVD Lungs: rare  basilar Rales CVS: S1-S2/regular rate rhythm Abd: soft, Non tender, non distended, BS present Extremities: No Cyanosis, Clubbing or edema Skin: no new rashes  Data Reviewed: I have personally reviewed following labs and imaging studies  CBC: Recent Labs  Lab 06/20/18 0945 06/21/18 0309 06/22/18 0326 06/23/18 0359  WBC 5.5 6.3 5.9 7.9  NEUTROABS 3.6  --  3.1 4.9  HGB 10.7* 11.4* 11.0* 10.7*  HCT 34.0* 36.4 34.6* 35.0*  MCV 95.5 93.8 93.8 95.9  PLT 192 197 192 198   Basic Metabolic Panel: Recent Labs  Lab 06/20/18 0945 06/21/18 0309 06/22/18 0326 06/23/18 0359  NA 134* 138 136 139  K 4.4 3.7 3.7 4.1  CL 100 100 96* 98  CO2 23 29 30 31   GLUCOSE 88 95 96 98  BUN 14 13 16 23   CREATININE 0.87 0.95 1.02* 0.94  CALCIUM 8.7* 8.8* 8.6* 8.6*  MG  --  1.8 1.8 1.9  PHOS  --  4.6 3.9 3.0   GFR: Estimated Creatinine Clearance: 34.7 mL/min (by C-G formula based on SCr of 0.94 mg/dL). Liver Function Tests: Recent Labs  Lab 06/20/18 0945 06/22/18 0326 06/23/18 0359  AST 20 17 16   ALT 17 15 12   ALKPHOS 85 82 79  BILITOT 0.7 0.9 0.8  PROT 6.0* 5.6* 5.4*  ALBUMIN 3.2* 3.1* 3.0*   No results for input(s): LIPASE, AMYLASE in the last 168 hours. No results for input(s): AMMONIA in the last 168 hours. Coagulation Profile: Recent Labs  Lab 06/20/18 0945 06/21/18 0309 06/22/18 0326 06/23/18 0359  INR 1.50 1.39 1.60 1.82   Cardiac Enzymes: Recent Labs  Lab 06/20/18 0945  TROPONINI <0.03   BNP (last 3 results) No results for input(s): PROBNP in the last 8760 hours. HbA1C: No results for input(s): HGBA1C in the last 72 hours. CBG: No results for input(s): GLUCAP in the last 168 hours. Lipid Profile: No results for input(s): CHOL, HDL, LDLCALC, TRIG, CHOLHDL, LDLDIRECT in the last 72 hours. Thyroid Function Tests: Recent Labs    06/21/18 0309  TSH 2.535   Anemia Panel: No results for input(s): VITAMINB12, FOLATE, FERRITIN, TIBC, IRON, RETICCTPCT in the last 72  hours. Sepsis Labs: No results for input(s): PROCALCITON, LATICACIDVEN in the last 168 hours.  Recent Results (from the past 240 hour(s))  Gram stain     Status: None   Collection Time: 06/22/18 10:28 AM  Result Value Ref Range Status   Specimen Description FLUID PLEURAL RIGHT  Final   Special Requests NONE  Final   Gram Stain   Final    FEW WBC PRESENT, PREDOMINANTLY MONONUCLEAR NO ORGANISMS SEEN Performed at [facility], [address], [place], [place] [address]    Report Status 06/22/2018 FINAL  Final     Radiology Studies: Dg Chest 1 View  Result Date: 06/22/2018 CLINICAL DATA:  Status post right-sided thoracentesis. EXAM: CHEST  1 VIEW COMPARISON:  Chest x-ray of earlier today FINDINGS: There is an approximately 15% right apical pneumothorax. There is no shift of the mediastinum. The volume of pleural fluid on the right has decreased. The cardiac silhouette remains enlarged. The left lung is mildly hyperinflated but stable. There is calcification in the wall of the aortic arch. The ICD is in  stable position. IMPRESSION: 15% right-sided pneumothorax post thoracentesis. These results were called by telephone at the time of interpretation on 06/22/2018 at 10:45 am to Philemon KingdomBill Simpson, RN, who verbally acknowledged these results. Decreased pleural fluid volume on the right. The remainder of the chest appears stable. Electronically Signed   By: David  SwazilandJordan M.D.   On: 06/22/2018 10:47   Ct Chest Wo Contrast  Result Date: 06/22/2018 CLINICAL DATA:  Pulmonary edema and worsening pleural effusion on the right. Pedal edema. EXAM: CT CHEST WITHOUT CONTRAST TECHNIQUE: Multidetector CT imaging of the chest was performed following the standard protocol without IV contrast. COMPARISON:  CXR 06/21/2018, CXR 06/05/2018, lumbar spine radiographs 03/29/2018 chest CT 08/16/2017 FINDINGS: Cardiovascular: Conventional branch pattern of the great vessels with atherosclerotic origins. Atherosclerosis of the  nonaneurysmal thoracic aorta, mild-to-moderate. Cardiomegaly with left main and three-vessel coronary arteriosclerosis is identified. Left-sided pacemaker apparatus with right atrial and right ventricular leads are present. No pericardial effusion is seen. Stable 5 mm right middle lobe nodule. Mediastinum/Nodes: Probable reactive precarinal lymph node measuring 13 mm short axis. Hilar lymph nodes are difficult to assess given lack of IV contrast. Patent trachea and mainstem bronchi. CT appearance of the esophagus is unremarkable. Lungs/Pleura: Moderate right and small left pleural effusions with compressive atelectasis similar to radiographic findings. Upper Abdomen: No acute abnormality. Musculoskeletal: Subacute to chronic stable superior endplate compressions of T8, T12, L1 and L2 since prior chest radiograph from 06/05/2018. IMPRESSION: 1. Moderate right and small left pleural effusions with compressive atelectasis at the right lung base. 2. Stable 5 mm right middle lobe pulmonary nodule since prior CT 2018. 3. Subacute to chronic T8, T12, L1 and L2 superior endplate compressions with mild-to-moderate height loss. These are stable dating back to 06/05/2018 chest radiographs. It appears that the compressions of the T12 and L2 superior endplates occurred sometime between the lumbar spine radiographs from 03/29/2018 and the chest radiograph of 06/05/2018. Aortic Atherosclerosis (ICD10-I70.0). Electronically Signed   By: Tollie Ethavid  Kwon M.D.   On: 06/22/2018 02:17   Dg Chest Port 1 View  Result Date: 06/23/2018 CLINICAL DATA:  Right pneumothorax following thoracentesis. EXAM: PORTABLE CHEST 1 VIEW COMPARISON:  One-view chest x-ray 06/22/2018. FINDINGS: Heart is enlarged. Mild edema and bilateral pleural effusions have increased slightly. A small right apical pneumothorax is unchanged. IMPRESSION: 1. Stable small right apical pneumothorax. 2. Slight increase in interstitial edema and bilateral pleural effusions.  Electronically Signed   By: Marin Robertshristopher  Mattern M.D.   On: 06/23/2018 10:04   Dg Chest Port 1 View  Result Date: 06/22/2018 CLINICAL DATA:  RIGHT pneumothorax. EXAM: PORTABLE CHEST 1 VIEW COMPARISON:  06/22/2018 FINDINGS: LEFT-sided transvenous pacemaker with leads to the RIGHT atrium and RIGHT ventricle. The heart is enlarged. There is small RIGHT pleural effusion. RIGHT pneumothorax is stable, estimated to be 15% of lung volume. IMPRESSION: Stable RIGHT pneumothorax. Cardiomegaly. Electronically Signed   By: Norva PavlovElizabeth  Brown M.D.   On: 06/22/2018 15:26   Dg Chest Port 1 View  Result Date: 06/22/2018 CLINICAL DATA:  Shortness of breath. EXAM: PORTABLE CHEST 1 VIEW COMPARISON:  One-view chest x-ray 06/21/2018 FINDINGS: The heart is enlarged. Atherosclerotic changes are present at the aorta. Aeration is improved in both lungs. Left pleural effusion is near completely resolved. Right pleural effusion remains. Right basilar airspace disease likely reflects atelectasis. The upper lung fields are clear. IMPRESSION: 1. Improved aeration of both lungs. 2. Near complete resolution of left pleural effusion with minimal left basilar atelectasis. 3. The right pleural effusion  has decreased in size. Mild right atelectasis is present. Electronically Signed   By: Marin Roberts M.D.   On: 06/22/2018 07:36   Ir Thoracentesis Asp Pleural Space W/img Guide  Result Date: 06/22/2018 INDICATION: Dyspnea, hypoxia, syncope. History of a.fib with pacemaker, pulmonary edema. Request for diagnostic and therapeutic thoracentesis. EXAM: ULTRASOUND GUIDED RIGHT THORACENTESIS MEDICATIONS: 10 mL 2% lidocaine. COMPLICATIONS: SIR Level A - No therapy, no consequence. Patient to remain on continuous O2, RN to monitor for increased dsypnea, repeat CXR ordered for 1500 today. PROCEDURE: An ultrasound guided thoracentesis was thoroughly discussed with the patient and questions answered. The benefits, risks, alternatives and  complications were also discussed. The patient understands and wishes to proceed with the procedure. Written consent was obtained. Ultrasound was performed to localize and mark an adequate pocket of fluid in the right chest. The area was then prepped and draped in the normal sterile fashion. 2% Lidocaine was used for local anesthesia. Under ultrasound guidance a 6 Fr Safe-T-Centesis catheter was introduced. Thoracentesis was performed. The catheter was removed and a dressing applied. FINDINGS: A total of approximately 800 mL of golden yellow fluid was removed. Samples were sent to the laboratory as requested by the clinical team. IMPRESSION: Successful ultrasound guided right thoracentesis yielding 800 mL of pleural fluid. Read by Lynnette Caffey, PA-C Electronically Signed   By: Jolaine Click M.D.   On: 06/22/2018 11:17   Scheduled Meds: . amiodarone  100 mg Oral Daily  . dicyclomine  10 mg Oral TID AC  . diltiazem  180 mg Oral Daily  . feeding supplement  1 Container Oral TID BM  . furosemide  20 mg Intravenous Once  . furosemide  20 mg Oral Daily  . loperamide  4 mg Oral Daily  . metoprolol tartrate  50 mg Oral BID  . potassium chloride  20 mEq Oral Once  . warfarin  3 mg Oral ONCE-1800  . Warfarin - Pharmacist Dosing Inpatient   Does not apply q1800   Continuous Infusions:   LOS: 3 days   Zannie Cove, MD [facility] Page via [email or web address] password [email or web address]  If 7PM-7AM, please contact night-coverage [email or web address] Password [facility] 06/23/2018, 12:59 PM

## 2018-06-23 NOTE — NC FL2 (Signed)
[place] MEDICAID FL2 LEVEL OF CARE SCREENING TOOL     IDENTIFICATION  Patient Name: Carol Snyder Birthdate: 1929/07/09 Sex: female Admission Date (Current Location): 06/20/2018  [facility] and [place] Number:  Producer, television/film/video and Address:  [facility], [address], [place], [place] [address]      Provider Number: [id number]  Attending Physician Name and Address:  Zannie Cove, MD  Relative Name and Phone Number:       Current Level of Care: Hospital Recommended Level of Care: Skilled Nursing Facility Prior Approval Number:    Date Approved/Denied:   PASRR Number:    Discharge Plan: SNF    Current Diagnoses: Patient Active Problem List   Diagnosis Date Noted  . Pleural effusion 06/20/2018  . Protein-calorie malnutrition, severe 06/07/2018  . Dehydration   . Laceration of right lower leg   . Fall 06/06/2018  . AKI (acute kidney injury) (HCC) 06/06/2018  . Diarrhea 06/06/2018  . Cardiac pacemaker in situ 06/06/2018  . Chronic anticoagulation 06/06/2018  . Atrial fibrillation, chronic (HCC) 07/04/2011    Orientation RESPIRATION BLADDER Height & Weight     Self, Time, Situation, Place  O2(2L [place]) Incontinent, External catheter Weight: 112 lb 10.5 oz (51.1 kg) Height:  5\' 6"  (167.6 cm)  BEHAVIORAL SYMPTOMS/MOOD NEUROLOGICAL BOWEL NUTRITION STATUS      Continent Diet(regular)  AMBULATORY STATUS COMMUNICATION OF NEEDS Skin   Limited Assist Verbally Other (Comment)(shin skin tear open to air)                       Personal Care Assistance Level of Assistance  Bathing, Dressing Bathing Assistance: Limited assistance   Dressing Assistance: Limited assistance     Functional Limitations Info             SPECIAL CARE FACTORS FREQUENCY  PT (By licensed PT), OT (By licensed OT)     PT Frequency: 5/wk OT Frequency: 5/wk            Contractures      Additional Factors Info  Code Status, Allergies Code Status Info:  FULL Allergies Info: Codeine, Enbrel Etanercept, Fosamax Alendronate Sodium, Hydromorphone, Motrin Ibuprofen, Sulfa Antibiotics, Voltaren Diclofenac, Naproxen Sodium     Isolation Precautions Info: none     Current Medications (06/23/2018):  This is the current hospital active medication list Current Facility-Administered Medications  Medication Dose Route Frequency Provider Last Rate Last Dose  . acetaminophen (TYLENOL) tablet 650 mg  650 mg Oral Q4H PRN Inez Catalina, MD   650 mg at 06/22/18 0254  . amiodarone (PACERONE) tablet 100 mg  100 mg Oral Daily Debe Coder B, MD   100 mg at 06/23/18 1041  . dicyclomine (BENTYL) capsule 10 mg  10 mg Oral TID AC Sheikh, Omair Latif, DO   10 mg at 06/23/18 1028  . diltiazem (CARDIZEM CD) 24 hr capsule 180 mg  180 mg Oral Daily Debe Coder B, MD   180 mg at 06/23/18 1041  . diphenoxylate-atropine (LOMOTIL) 2.5-0.025 MG per tablet 1 tablet  1 tablet Oral QID PRN Inez Catalina, MD   1 tablet at 06/21/18 1248  . feeding supplement (BOOST / RESOURCE BREEZE) liquid 1 Container  1 Container Oral TID BM Sheikh, Omair [place], DO   1 Container at 06/22/18 1453  . furosemide (LASIX) injection 20 mg  20 mg Intravenous Once Zannie Cove, MD      . furosemide (LASIX) tablet 20 mg  20 mg Oral Daily Debe CoderMullen, Emily B, MD   20 mg at 06/23/18 1028  . hydroxypropyl methylcellulose / hypromellose (ISOPTO TEARS / GONIOVISC) 2.5 % ophthalmic solution 1 drop  1 drop Both Eyes TID PRN Inez CatalinaMullen, Emily B, MD   1 drop at 06/23/18 1031  . lidocaine (XYLOCAINE) 2 % injection    PRN Lynnette CaffeyWatterson, Shannon A, PA-C   10 mL at 06/22/18 1011  . loperamide (IMODIUM) capsule 4 mg  4 mg Oral Daily Marguerita MerlesSheikh, Omair QuitaqueLatif, DO   4 mg at 06/22/18 1211  . metoprolol tartrate (LOPRESSOR) tablet 50 mg  50 mg Oral BID Debe CoderMullen, Emily B, MD   50 mg at 06/23/18 1028  . ondansetron (ZOFRAN) tablet 4 mg  4 mg Oral Q6H PRN Inez CatalinaMullen, Emily B, MD       Or  . ondansetron [facility](ZOFRAN) injection 4 mg  4 mg  Intravenous Q6H PRN Debe CoderMullen, Emily B, MD      . potassium chloride SA (K-DUR,KLOR-CON) CR tablet 20 mEq  20 mEq Oral Once Zannie CoveJoseph, Preetha, MD      . warfarin (COUMADIN) tablet 3 mg  3 mg Oral ONCE-1800 Zannie CoveJoseph, Preetha, MD      . Warfarin - Pharmacist Dosing Inpatient   Does not apply q1800 Ilda BassetDurham, Jennifer D, [facility] CenterRPH         Discharge Medications: Please see discharge summary for a list of discharge medications.  Relevant Imaging Results:  Relevant Lab Results:   Additional Information SSN: [id number]  Carneshia Raker, CaspianJenna H, [place]

## 2018-06-23 NOTE — Progress Notes (Signed)
0700 CXR follow up from post-thoracentesis apical pneumothorax. No further intervention planned at this time from IR perspective, IR will no longer follow at this point - recommend continued monitoring on floor and repeat CXR if patient develops s/s.  Please call IR with any further questions or concerns.  Lynnette CaffeyShannon Danasia Baker, PA-C 06/23/18 1028

## 2018-06-24 ENCOUNTER — Inpatient Hospital Stay (HOSPITAL_COMMUNITY): Payer: Medicare Other

## 2018-06-24 LAB — BASIC METABOLIC PANEL
ANION GAP: 9 (ref 5–15)
BUN: 21 mg/dL (ref 8–23)
CHLORIDE: 97 mmol/L — AB (ref 98–111)
CO2: 31 mmol/L (ref 22–32)
Calcium: 8.7 mg/dL — ABNORMAL LOW (ref 8.9–10.3)
Creatinine, Ser: 0.87 mg/dL (ref 0.44–1.00)
GFR calc Af Amer: >60 mL/min (ref >60)
GFR calc non Af Amer: 59 mL/min — ABNORMAL LOW (ref >60)
Glucose, Bld: 95 mg/dL (ref 70–99)
POTASSIUM: 4.1 mmol/L (ref 3.5–5.1)
Sodium: 137 mmol/L (ref 135–145)

## 2018-06-24 LAB — CBC
HCT: 33.8 % — ABNORMAL LOW (ref 36.0–46.0)
HEMOGLOBIN: 10.5 g/dL — AB (ref 12.0–15.0)
MCH: 29.3 pg (ref 26.0–34.0)
MCHC: 31.1 g/dL (ref 30.0–36.0)
MCV: 94.4 fL (ref 78.0–100.0)
PLATELETS: 192 10*3/uL (ref 150–400)
RBC: 3.58 MIL/uL — ABNORMAL LOW (ref 3.87–5.11)
RDW: 14.5 % (ref 11.5–15.5)
WBC: 7.8 10*3/uL (ref 4.0–10.5)

## 2018-06-24 LAB — PROTIME-INR
INR: 2.13
Prothrombin Time: 23.6 seconds — ABNORMAL HIGH (ref 11.4–15.2)

## 2018-06-24 MED ORDER — FUROSEMIDE 20 MG PO TABS: 20.0000 mg | ORAL_TABLET | Freq: Every day | ORAL | Status: DC

## 2018-06-24 MED ORDER — WARFARIN SODIUM 3 MG PO TABS: 3.0000 mg | ORAL_TABLET | Freq: Every day | ORAL | Status: DC

## 2018-06-24 NOTE — Plan of Care (Signed)

## 2018-06-24 NOTE — Discharge Instructions (Signed)

## 2018-06-24 NOTE — Progress Notes (Signed)
[facility]ticipated discharge date: 06/24/18 Family notified: pt dtr at bedside Transportation by pt dtr Report #: [phone]  CSW signing off.  Burna SisJenna H. Cailen Texeira, LCSW Clinical Social Worker [phone]

## 2018-06-24 NOTE — Discharge Summary (Signed)
Physician Discharge Summary  Carol Snyder ZOX:[id number] DOB: 1929/07/05 DOA: 06/20/2018  PCP: Carol MaskElkins, Wilson Oliver, MD  Admit date: 06/20/2018 Discharge date: 06/24/2018  Time spent: 35 minutes  Recommendations for Outpatient Follow-up:  Primary care physician Dr. Jeannetta NapElkins in one week please assess point status and titrate diuretic dose as needed, recently admitted with dehydration and acute kidney injury followed by quick readmission with volume overload 2. Chest x-ray in one week 3. Pulmonary nodules incidentally noted on CT chest, consider appropriate follow-up 4. Cardiology Dr.Akbary in 2 weeks  Discharge Diagnoses:    Pulmonary edema   Bilateral pleural effusions   Acute on chronic diastolic CHF   History of orthostatic hypotension   Chronic diarrhea   Atrial fibrillation, chronic (HCC)   Protein-calorie malnutrition, severe   Laceration of right lower leg   Pleural effusion   Discharge Condition: improving  Diet recommendation: low sodium heart healthy  Filed Weights   06/20/18 1636  Weight: 51.1 kg    History of present illness:  Carol Snyder a 84 y.o.femalewith medical history significant andfor CVA, paroxysmal atrial fibrillation, dual-chamber pacemaker, orthostatic hypotension, chronic diastolic CHF, chronic diarrhea, Shingles who presented for SOB and weakness. [name] was recently admitted to the hospital for laceration of the right leg, syncope and dehydration. She was discharged on 06/09/18. At that time, her lasix was decreased from 40mg  daily to 20mg  MWF to help with her dehydration and passing out/falls. She was noted to have a small right pleural effusion at the time of admission as well. -on admission she presented with 1-2 days of weakness, worsening dyspnea and hypoxia in ED she was found to have pulmonary edema worsening pleural effusions.  Hospital Course:   Bilateral pleural effusions, R>L -Due to acute on chronic diastolic CHF, initially diuresed  with IV Lasix since pleural effusion persisted she underwent thoracentesis - status post thoracentesis 8/6, with 800ml drained,  fluid is transudate by Light's criteria, cultures negative, thoracentesis was complicated by small apical pneumothorax which is small improving and asymptomatic -she had recurrence of pulmonary edema again this admission, requiring IV Lasix again and hence her discharge dose of Lasix was increased to 40 mg daily alternating with 20 mg daily. Her diuretic dose will need to be titrated frequently based on her volume status, since she also has a history of chronic diarrhea and orthostatic hypotension.  Chronic diastolic CHF -See above, aggressive diuresis limited by chronic orthostatic hypotension and diarrhea -follow-up with cardiologist Dr. Sandy Sal[facility]amAli [facility]  Small apical pneumothorax -following thoracentesis 8/6, follow-up chest x-ray yesterday was stable, today's x-ray shows improvement, she is asymptomatic. -recommend follow-up chest x-ray in one week  Atrial fibrillation, chronic - Rate controlled, also on Amiodarone - Continue rate control with metoprolol and diltiazem - Coumadin continued, INR therapeutic at discharge  Protein-calorie malnutrition, severe - Nutrition consult appreciated, continue boost  Laceration of right lower leg - Appears to be healing, no erythema surrounding.  - supportive care  Chronic Diarrhea -Seen and [facility]; Felt it was secondary to postcholecystectomy and Colonic resection.  -Recent C Diff and GI Pathogen Panel Negative -Started Dicyclomine 10 mg po AC -C/w Lomitil and Imodium -follow-up would gastroenterology  Normocytic Anemia -stable, monitor  Code Status: DO NOT RESUSCITATE   Consultants:   Discussed Case with GI PA  Interventional radiology  Procedures:  Right Thoracentesis 8/6- Yielded 800 mL of Golden Yellow Fluid   Discharge Exam: Vitals:   06/24/18 0000 06/24/18  [id number]  BP: (!) 104/47 116/76  Pulse: 84 83  Resp: 18 14  Temp: 98.6 F (37 C) 98 F (36.7 C)  SpO2: 98% 97%    General: AAOx3 Cardiovascular: S1S2/RRR Respiratory: rare basilar crackles  Discharge Instructions   Discharge Instructions    Diet - low sodium heart healthy   Complete by:  As directed    Increase activity slowly   Complete by:  As directed      Allergies as of 06/24/2018      Reactions   Codeine    Enbrel [etanercept]    Fosamax [alendronate Sodium]    Hydromorphone    Motrin [ibuprofen]    Sulfa Antibiotics Other (See Comments)   Childhood allergy    Voltaren [diclofenac]    Naproxen Sodium Palpitations      Medication List    TAKE these medications   acetaminophen 325 MG tablet Commonly known as:  TYLENOL Take 650 mg by mouth every 4 (four) hours as needed for mild pain or fever (or if temp is 99.0 or greater for 72 hours.).   amiodarone 100 MG tablet Commonly known as:  PACERONE Take 100 mg by mouth daily.   diltiazem 180 MG 24 hr capsule Commonly known as:  TIAZAC Take 180 mg by mouth daily.   diphenoxylate-atropine 2.5-0.025 MG tablet Commonly known as:  LOMOTIL Take 1 tablet by mouth 4 (four) times daily as needed for diarrhea or loose stools. What changed:  how much to take   furosemide 20 MG tablet Commonly known as:  LASIX Take 1-2 tablets (20-40 mg total) by mouth daily. Take 40mg  daily alternating with 20mg  daily What changed:    how much to take  when to take this  additional instructions   hydroxypropyl methylcellulose / hypromellose 2.5 % ophthalmic solution Commonly known as:  ISOPTO TEARS / GONIOVISC Place 1 drop into both eyes 3 (three) times daily as needed for dry eyes.   loperamide 2 MG capsule Commonly known as:  IMODIUM Take 4 mg by mouth daily.   metoprolol tartrate 50 MG tablet Commonly known as:  LOPRESSOR Take 50 mg by mouth 2 (two) times daily.   warfarin 2.5 MG tablet Commonly known as:   COUMADIN Take 2.5 mg by mouth 3 (three) times a week. Mondays, Wednesday, Friday.   warfarin 3 MG tablet Commonly known as:  COUMADIN Take 3 mg by mouth 3 (three) times a week. Tuesday, Thursday, Saturday, and Sunday.      Allergies  Allergen Reactions  . Codeine   . Enbrel [Etanercept]   . Fosamax [Alendronate Sodium]   . Hydromorphone   . Motrin [Ibuprofen]   . Sulfa Antibiotics Other (See Comments)    Childhood allergy   . Voltaren [Diclofenac]   . Naproxen Sodium Palpitations    Contact information for follow-up providers    Carol Mask, MD. Schedule an appointment as soon as possible for a visit in 1 week(s).   Specialty:  Family Medicine Contact information: [address] [place] [address] [phone]        Sandy Salaam, MD. Schedule an appointment as soon as possible for a visit in 2 week(s).   Specialty:  Cardiology Contact information: [address] [place] [address] [phone]            Contact information for after-discharge care    Destination    [facility] .   Service:  Skilled Paramedic information: [address]  [place] [address] [phone]                   The results of significant diagnostics from this hospitalization (including imaging, microbiology, ancillary and laboratory) are listed below for reference.    Significant Diagnostic Studies: Dg Chest 1 View  Result Date: 06/22/2018 CLINICAL DATA:  Status post right-sided thoracentesis. EXAM: CHEST  1 VIEW COMPARISON:  Chest x-ray of earlier today FINDINGS: There is an approximately 15% right apical pneumothorax. There is no shift of the mediastinum. The volume of pleural fluid on the right has decreased. The cardiac silhouette remains enlarged. The left lung is mildly hyperinflated but stable. There is calcification in the wall of the aortic arch. The ICD is in stable  position. IMPRESSION: 15% right-sided pneumothorax post thoracentesis. These results were called by telephone at the time of interpretation on 06/22/2018 at 10:45 am to Philemon Kingdom, RN, who verbally acknowledged these results. Decreased pleural fluid volume on the right. The remainder of the chest appears stable. Electronically Signed   By: David  [place] M.D.   On: 06/22/2018 10:47   Dg Chest 2 View  Result Date: 06/05/2018 CLINICAL DATA:  Dizziness and lightheadedness. Recent falls. Weakness. EXAM: CHEST - 2 VIEW COMPARISON:  08/18/2017 FINDINGS: Stable moderate cardiomegaly. Dual lead transvenous pacemaker remains in appropriate position. Stable pulmonary hyperinflation. Tiny right pleural effusion appears new since previous study. No evidence of pulmonary consolidation or edema. IMPRESSION: Stable cardiomegaly and pulmonary hyperinflation. New tiny right pleural effusion. No evidence of frank pulmonary edema or consolidation. Electronically Signed   By: Myles Rosenthal M.D.   On: 06/05/2018 22:40   Dg Tibia/fibula Right  Result Date: 06/05/2018 CLINICAL DATA:  Fall today. Right leg injury and pain. Initial encounter. EXAM: RIGHT TIBIA AND FIBULA - 2 VIEW COMPARISON:  None. FINDINGS: There is no evidence of fracture or other focal bone lesions. Generalized osteopenia noted. Peripheral vascular calcification also seen. IMPRESSION: No evidence of fracture. Osteopenia. Electronically Signed   By: Myles Rosenthal M.D.   On: 06/05/2018 20:52   Ct Head Wo Contrast  Result Date: 06/05/2018 CLINICAL DATA:  Dizziness and lightheadedness. Ataxia. Fall today at home. Head trauma. Initial encounter. EXAM: CT HEAD WITHOUT CONTRAST TECHNIQUE: Contiguous axial images were obtained from the base of the skull through the vertex without intravenous contrast. COMPARISON:  08/16/2017 FINDINGS: Brain: No evidence of acute infarction, hemorrhage, hydrocephalus, extra-axial collection, or mass lesion/mass effect. Moderate diffuse  cerebral atrophy and chronic small vessel disease show no significant change compared to prior study. Vascular:  No hyperdense vessel or other acute findings. Skull: No evidence of fracture or other significant bone abnormality. Sinuses/Orbits:  No acute findings. Other: None. IMPRESSION: No acute intracranial abnormality. Stable cerebral atrophy and chronic small vessel disease. Electronically Signed   By: Myles Rosenthal M.D.   On: 06/05/2018 22:54   Ct Chest Wo Contrast  Result Date: 06/22/2018 CLINICAL DATA:  Pulmonary edema and worsening pleural effusion on the right. Pedal edema. EXAM: CT CHEST WITHOUT CONTRAST TECHNIQUE: Multidetector CT imaging of the chest was performed following the standard protocol without IV contrast. COMPARISON:  CXR 06/21/2018, CXR 06/05/2018, lumbar spine radiographs 03/29/2018 chest CT 08/16/2017 FINDINGS: Cardiovascular: Conventional branch pattern of the great vessels with atherosclerotic origins. Atherosclerosis of the nonaneurysmal thoracic aorta, mild-to-moderate. Cardiomegaly with left main and three-vessel coronary arteriosclerosis is identified. Left-sided pacemaker apparatus with right atrial and right ventricular leads are present. No pericardial effusion is seen. Stable 5 mm right middle  lobe nodule. Mediastinum/Nodes: Probable reactive precarinal lymph node measuring 13 mm short axis. Hilar lymph nodes are difficult to assess given lack of IV contrast. Patent trachea and mainstem bronchi. CT appearance of the esophagus is unremarkable. Lungs/Pleura: Moderate right and small left pleural effusions with compressive atelectasis similar to radiographic findings. Upper Abdomen: No acute abnormality. Musculoskeletal: Subacute to chronic stable superior endplate compressions of T8, T12, L1 and L2 since prior chest radiograph from 06/05/2018. IMPRESSION: 1. Moderate right and small left pleural effusions with compressive atelectasis at the right lung base. 2. Stable 5 mm right  middle lobe pulmonary nodule since prior CT 2018. 3. Subacute to chronic T8, T12, L1 and L2 superior endplate compressions with mild-to-moderate height loss. These are stable dating back to 06/05/2018 chest radiographs. It appears that the compressions of the T12 and L2 superior endplates occurred sometime between the lumbar spine radiographs from 03/29/2018 and the chest radiograph of 06/05/2018. Aortic Atherosclerosis (ICD10-I70.0). Electronically Signed   By: Tollie Eth M.D.   On: 06/22/2018 02:17   Dg Chest Port 1 View  Result Date: 06/23/2018 CLINICAL DATA:  Right pneumothorax following thoracentesis. EXAM: PORTABLE CHEST 1 VIEW COMPARISON:  One-view chest x-ray 06/22/2018. FINDINGS: Heart is enlarged. Mild edema and bilateral pleural effusions have increased slightly. A small right apical pneumothorax is unchanged. IMPRESSION: 1. Stable small right apical pneumothorax. 2. Slight increase in interstitial edema and bilateral pleural effusions. Electronically Signed   By: Marin Roberts M.D.   On: 06/23/2018 10:04   Dg Chest Port 1 View  Result Date: 06/22/2018 CLINICAL DATA:  RIGHT pneumothorax. EXAM: PORTABLE CHEST 1 VIEW COMPARISON:  06/22/2018 FINDINGS: LEFT-sided transvenous pacemaker with leads to the RIGHT atrium and RIGHT ventricle. The heart is enlarged. There is small RIGHT pleural effusion. RIGHT pneumothorax is stable, estimated to be 15% of lung volume. IMPRESSION: Stable RIGHT pneumothorax. Cardiomegaly. Electronically Signed   By: Norva Pavlov M.D.   On: 06/22/2018 15:26   Dg Chest Port 1 View  Result Date: 06/22/2018 CLINICAL DATA:  Shortness of breath. EXAM: PORTABLE CHEST 1 VIEW COMPARISON:  One-view chest x-ray 06/21/2018 FINDINGS: The heart is enlarged. Atherosclerotic changes are present at the aorta. Aeration is improved in both lungs. Left pleural effusion is near completely resolved. Right pleural effusion remains. Right basilar airspace disease likely reflects  atelectasis. The upper lung fields are clear. IMPRESSION: 1. Improved aeration of both lungs. 2. Near complete resolution of left pleural effusion with minimal left basilar atelectasis. 3. The right pleural effusion has decreased in size. Mild right atelectasis is present. Electronically Signed   By: Marin Roberts M.D.   On: 06/22/2018 07:36   Dg Chest Port 1 View  Result Date: 06/21/2018 CLINICAL DATA:  Shortness of breath and chest congestion. Pleural effusion. EXAM: PORTABLE CHEST 1 VIEW COMPARISON:  Chest x-rays dated 06/20/2018, 06/05/2018, 08/18/2017, and chest CT dated 08/16/2017 FINDINGS: Interval increase in the moderate right pleural effusion. New small left pleural effusion. Chronic cardiomegaly. Chronic accentuation of the interstitial markings. However, the markings are more prominent on today's exam, probably representing slight interstitial edema. Aortic atherosclerosis.  Two lead pacemaker in place. No acute bone abnormality. Arthritic changes of both glenohumeral joints. IMPRESSION: 1. Increasing moderate right pleural effusion. 2. New small left pleural effusion. 3. Probable mild interstitial edema superimposed on chronic interstitial disease. 4. Persistent cardiomegaly. 5.  Aortic Atherosclerosis (ICD10-I70.0). Electronically Signed   By: Francene Boyers M.D.   On: 06/21/2018 10:01   Dg Chest Portable 1 View  Result Date: 06/20/2018 CLINICAL DATA:  Shortness of breath EXAM: PORTABLE CHEST 1 VIEW COMPARISON:  06/05/2017 FINDINGS: Cardiac shadow remains enlarged. Pacing device is again seen. Increasing right-sided pleural effusion right basilar atelectasis is noted. No acute bony abnormality is noted. IMPRESSION: Increasing right basilar atelectasis and right pleural effusion. Electronically Signed   By: Alcide Clever M.D.   On: 06/20/2018 10:08   Ir Thoracentesis Asp Pleural Space W/img Guide  Result Date: 06/22/2018 INDICATION: Dyspnea, hypoxia, syncope. History of a.fib with  pacemaker, pulmonary edema. Request for diagnostic and therapeutic thoracentesis. EXAM: ULTRASOUND GUIDED RIGHT THORACENTESIS MEDICATIONS: 10 mL 2% lidocaine. COMPLICATIONS: SIR Level A - No therapy, no consequence. Patient to remain on continuous O2, RN to monitor for increased dsypnea, repeat CXR ordered for 1500 today. PROCEDURE: An ultrasound guided thoracentesis was thoroughly discussed with the patient and questions answered. The benefits, risks, alternatives and complications were also discussed. The patient understands and wishes to proceed with the procedure. Written consent was obtained. Ultrasound was performed to localize and mark an adequate pocket of fluid in the right chest. The area was then prepped and draped in the normal sterile fashion. 2% Lidocaine was used for local anesthesia. Under ultrasound guidance a 6 Fr Safe-T-Centesis catheter was introduced. Thoracentesis was performed. The catheter was removed and a dressing applied. FINDINGS: A total of approximately 800 mL of golden yellow fluid was removed. Samples were sent to the laboratory as requested by the clinical team. IMPRESSION: Successful ultrasound guided right thoracentesis yielding 800 mL of pleural fluid. Read by Lynnette Caffey, PA-C Electronically Signed   By: Jolaine Click M.D.   On: 06/22/2018 11:17    Microbiology: Recent Results (from the past 240 hour(s))  Gram stain     Status: None   Collection Time: 06/22/18 10:28 AM  Result Value Ref Range Status   Specimen Description FLUID PLEURAL RIGHT  Final   Special Requests NONE  Final   Gram Stain   Final    FEW WBC PRESENT, PREDOMINANTLY MONONUCLEAR NO ORGANISMS SEEN Performed at [facility], [address], [place], [place] [address]    Report Status 06/22/2018 FINAL  Final  Acid Fast Smear (AFB)     Status: None   Collection Time: 06/22/18 10:28 AM  Result Value Ref Range Status   AFB Specimen Processing Concentration  Final   Acid Fast Smear Negative   Final    Comment: (NOTE) Performed At: [facility] [address] [place], [place] [id number] Jolene Schimke MD XB:[id number]    Source (AFB) FLUID  Final    Comment: PLEURAL RIGHT   Culture, body fluid-bottle     Status: None (Preliminary result)   Collection Time: 06/22/18 10:28 AM  Result Value Ref Range Status   Specimen Description FLUID PLEURAL RIGHT  Final   Special Requests NONE  Final   Culture   Final    NO GROWTH 1 DAY Performed at [facility], [address], [place], [place] [address]    Report Status PENDING  Incomplete     Labs: Basic Metabolic Panel: Recent Labs  Lab 06/20/18 0945 06/21/18 0309 06/22/18 0326 06/23/18 0359 06/24/18 0348  NA 134* 138 136 139 137  K 4.4 3.7 3.7 4.1 4.1  CL 100 100 96* 98 97*  CO2 23 29 30 31 31   GLUCOSE 88 95 96 98 95  BUN 14 13 16 23 21   CREATININE 0.87 0.95 1.02* 0.94 0.87  CALCIUM 8.7* 8.8* 8.6* 8.6* 8.7*  MG  --  1.8 1.8 1.9  --   PHOS  --  4.6 3.9 3.0  --    Liver Function Tests: Recent Labs  Lab 06/20/18 0945 06/22/18 0326 06/23/18 0359  AST 20 17 16   ALT 17 15 12   ALKPHOS 85 82 79  BILITOT 0.7 0.9 0.8  PROT 6.0* 5.6* 5.4*  ALBUMIN 3.2* 3.1* 3.0*   No results for input(s): LIPASE, AMYLASE in the last 168 hours. No results for input(s): AMMONIA in the last 168 hours. CBC: Recent Labs  Lab 06/20/18 0945 06/21/18 0309 06/22/18 0326 06/23/18 0359 06/24/18 0348  WBC 5.5 6.3 5.9 7.9 7.8  NEUTROABS 3.6  --  3.1 4.9  --   HGB 10.7* 11.4* 11.0* 10.7* 10.5*  HCT 34.0* 36.4 34.6* 35.0* 33.8*  MCV 95.5 93.8 93.8 95.9 94.4  PLT 192 197 192 198 192   Cardiac Enzymes: Recent Labs  Lab 06/20/18 0945  TROPONINI <0.03   BNP: BNP (last 3 results) Recent Labs    06/20/18 0945  BNP 782.3*    ProBNP (last 3 results) No results for input(s): PROBNP in the last 8760 hours.  CBG: No results for input(s): GLUCAP in the last 168 hours.     Signed:  Zannie Cove MD.  [facility] 06/24/2018, 11:33 AM

## 2018-06-24 NOTE — Progress Notes (Signed)
Physical Therapy Treatment Patient Details Name: Carol Snyder MRN: [id number] DOB: 29-Mar-1929 Today's Date: 06/24/2018    History of Present Illness Pt is an 84 y.o. female admitted from SNF on 06/20/18 with weakness, SOB and hypoxia. CXR showed pulmonary edema and worsening R pleural effusion. S/p thoracentesis 8/6; xray showed 15% pnemothorax post-thoracentesis. PMH includes CVA, a-fib, pacemaker, Shingles.   PT Comments    Pt progressing with mobility; very motivated to participate. Amb with RW and close min guard for balance. Remains at significant increased risk for falls. Daughter present throughout session; able to practice with use of gait belt to provide pt assist as needed. Continue to recommend SNF-level therapies. Will follow acutely.   Follow Up Recommendations  SNF;Supervision for mobility/OOB     Equipment Recommendations  None recommended by PT    Recommendations for Other Services       Precautions / Restrictions Precautions Precautions: Fall Restrictions Weight Bearing Restrictions: No    Mobility  Bed Mobility               General bed mobility comments: Received sitting in chari  Transfers Overall transfer level: Needs assistance Equipment used: Rolling walker (2 wheeled) Transfers: Sit to/from Stand Sit to Stand: Min guard         General transfer comment: Performed multiple sit<>stands with RW and min guard. Practiced with daughter using gait belt to guard/provide assist as needed  Ambulation/Gait Ambulation/Gait assistance: Min guard Gait Distance (Feet): (hallway) Assistive device: Rolling walker (2 wheeled) Gait Pattern/deviations: Step-through pattern;Trunk flexed;Decreased stride length Gait velocity: Decreased Gait velocity interpretation: <1.8 ft/sec, indicate of risk for recurrent falls General Gait Details: Repeated cues to maintain upright posture. Close min guard for balance. Intermittent standing rest breaks for fatigue. On  RA   Stairs             Wheelchair Mobility    Modified Rankin (Stroke Patients Only)       Balance Overall balance assessment: Needs assistance Sitting-balance support: Feet supported;No upper extremity supported Sitting balance-Leahy Scale: Good Sitting balance - Comments: Able to don socks while sitting     Standing balance-Leahy Scale: Fair Standing balance comment: Can static stand with close min guard; reliant on UE support for dynamic balance                            Cognition Arousal/Alertness: Awake/alert Behavior During Therapy: WFL for tasks assessed/performed Overall Cognitive Status: History of cognitive impairments - at baseline                                 General Comments: repeats herself, impaired memory      Exercises      General Comments General comments (skin integrity, edema, etc.): Daughter present throughout session      Pertinent Vitals/Pain Pain Assessment: No/denies pain    Home Living                      Prior Function            PT Goals (current goals can now be found in the care plan section) Acute Rehab PT Goals Patient Stated Goal: Return to independence and living at home PT Goal Formulation: With patient Time For Goal Achievement: 07/06/18 Potential to Achieve Goals: Good Progress towards PT goals: Progressing toward goals    Frequency  Min 2X/week      PT Plan Current plan remains appropriate    Co-evaluation              AM-PAC PT "6 Clicks" Daily Activity  Outcome Measure  Difficulty turning over in bed (including adjusting bedclothes, sheets and blankets)?: None Difficulty moving from lying on back to sitting on the side of the bed? : None Difficulty sitting down on and standing up from a chair with arms (e.g., wheelchair, bedside commode, etc,.)?: Unable Help needed moving to and from a bed to chair (including a wheelchair)?: A Little Help needed  walking in hospital room?: A Little Help needed climbing 3-5 steps with a railing? : A Lot 6 Click Score: 17    End of Session Equipment Utilized During Treatment: Gait belt Activity Tolerance: Patient tolerated treatment well Patient left: in chair;with call bell/phone within reach;with family/visitor present Nurse Communication: Mobility status PT Visit Diagnosis: Unsteadiness on feet (R26.81);Muscle weakness (generalized) (M62.81);Difficulty in walking, not elsewhere classified (R26.2)     Time: 4098-1191 PT Time Calculation (min) (ACUTE ONLY): 19 min  Charges:  $Gait Training: 8-22 mins                     Ina Homes, PT, DPT Acute Rehab Services  Pager: [phone]  Malachy Chamber 06/24/2018, 12:18 PM

## 2018-06-24 NOTE — Care Management Important Message (Signed)
Important Message  Patient Details  Name: Carol Snyder MRN: [id number] Date of Birth: Feb 02, 1929   Medicare Important Message Given:  Yes    Enora Trillo 06/24/2018, 1:39 PM

## 2018-06-24 NOTE — Progress Notes (Signed)
ANTICOAGULATION CONSULT NOTE - Initial Consult  Pharmacy Consult for warfarin Indication: atrial fibrillation and hx stroke  Allergies  Allergen Reactions  . Codeine   . Enbrel [Etanercept]   . Fosamax [Alendronate Sodium]   . Hydromorphone   . Motrin [Ibuprofen]   . Sulfa Antibiotics Other (See Comments)    Childhood allergy   . Voltaren [Diclofenac]   . Naproxen Sodium Palpitations    Patient Measurements: Height: 5\' 6"  (167.6 cm) Weight: 112 lb 10.5 oz (51.1 kg) IBW/kg (Calculated) : 59.3  Vital Signs: Temp: 98 F (36.7 C) (08/08 0737) Temp Source: Oral (08/08 0737) BP: 116/76 (08/08 0737) Pulse Rate: 83 (08/08 0737)  Labs: Recent Labs    06/22/18 0326 06/23/18 0359 06/24/18 0348  HGB 11.0* 10.7* 10.5*  HCT 34.6* 35.0* 33.8*  PLT 192 198 192  LABPROT 18.9* 20.9* 23.6*  INR 1.60 1.82 2.13  CREATININE 1.02* 0.94 0.87    Estimated Creatinine Clearance: 37.4 mL/min (by C-G formula based on SCr of 0.87 mg/dL).   Medical History: Past Medical History:  Diagnosis Date  . Atrial fibrillation (HCC)   . Chest pain   . History of shingles   . Long term current use of anticoagulant   . Pacemaker   . Stroke [facility](HCC)     Medications:  See electronic list once complete  Assessment: 84 y/o female who presents to the ED with SOB and not feeling well this morning. She takes warfarin PTA for Afib and hx stroke.  INR is therapeutic now at 2.13. . No bleeding noted, Hgb 10.5, platelets are normal. Will try a higher home dose.    PTA regimen: 3 mg daily except 2.5 mg MWF, last dose 8/3  Goal of Therapy:  INR 2-3 Monitor platelets by anticoagulation protocol: Yes   Plan:  Start coumadin 3mg  PO qday Daily INR Monitor for s/sx of bleeding   Ulyses SouthwardMinh Danford Tat, PharmD, Suzan NailerBCIDP, AAHIVP, CPP Infectious Disease Pharmacist Pager: [phone] 06/24/2018 9:18 AM

## 2018-06-27 LAB — CULTURE, BODY FLUID W GRAM STAIN -BOTTLE: Culture: NO GROWTH

## 2018-06-27 LAB — CULTURE, BODY FLUID-BOTTLE

## 2018-08-10 LAB — ACID FAST CULTURE WITH REFLEXED SENSITIVITIES (MYCOBACTERIA): Acid Fast Culture: NEGATIVE

## 2018-09-28 ENCOUNTER — Encounter (HOSPITAL_COMMUNITY): Payer: Self-pay | Admitting: Emergency Medicine

## 2018-09-28 ENCOUNTER — Other Ambulatory Visit: Payer: Self-pay

## 2018-09-28 ENCOUNTER — Emergency Department (HOSPITAL_COMMUNITY): Payer: Medicare Other

## 2018-09-28 ENCOUNTER — Inpatient Hospital Stay (HOSPITAL_COMMUNITY)
Admission: EM | Admit: 2018-09-28 | Discharge: 2018-10-01 | DRG: 291 | Disposition: A | Discharge status: Home / Self Care | Payer: Medicare Other | Attending: Internal Medicine | Admitting: Internal Medicine

## 2018-09-28 ENCOUNTER — Other Ambulatory Visit (HOSPITAL_COMMUNITY): Payer: Medicare Other

## 2018-09-28 DIAGNOSIS — K580 Irritable bowel syndrome with diarrhea: Secondary | ICD-10-CM | POA: Diagnosis present

## 2018-09-28 DIAGNOSIS — Z66 Do not resuscitate: Secondary | ICD-10-CM | POA: Diagnosis present

## 2018-09-28 DIAGNOSIS — I4819 Other persistent atrial fibrillation: Secondary | ICD-10-CM | POA: Diagnosis not present

## 2018-09-28 DIAGNOSIS — Z885 Allergy status to narcotic agent status: Secondary | ICD-10-CM

## 2018-09-28 DIAGNOSIS — J90 Pleural effusion, not elsewhere classified: Secondary | ICD-10-CM | POA: Diagnosis not present

## 2018-09-28 DIAGNOSIS — Z882 Allergy status to sulfonamides status: Secondary | ICD-10-CM

## 2018-09-28 DIAGNOSIS — I4892 Unspecified atrial flutter: Secondary | ICD-10-CM | POA: Diagnosis present

## 2018-09-28 DIAGNOSIS — I2729 Other secondary pulmonary hypertension: Secondary | ICD-10-CM | POA: Diagnosis present

## 2018-09-28 DIAGNOSIS — Z79899 Other long term (current) drug therapy: Secondary | ICD-10-CM | POA: Diagnosis not present

## 2018-09-28 DIAGNOSIS — Z7901 Long term (current) use of anticoagulants: Secondary | ICD-10-CM

## 2018-09-28 DIAGNOSIS — Z8673 Personal history of transient ischemic attack (TIA), and cerebral infarction without residual deficits: Secondary | ICD-10-CM | POA: Diagnosis not present

## 2018-09-28 DIAGNOSIS — I5023 Acute on chronic systolic (congestive) heart failure: Secondary | ICD-10-CM

## 2018-09-28 DIAGNOSIS — I482 Chronic atrial fibrillation, unspecified: Secondary | ICD-10-CM | POA: Diagnosis present

## 2018-09-28 DIAGNOSIS — I5033 Acute on chronic diastolic (congestive) heart failure: Secondary | ICD-10-CM | POA: Diagnosis not present

## 2018-09-28 DIAGNOSIS — E876 Hypokalemia: Secondary | ICD-10-CM | POA: Diagnosis not present

## 2018-09-28 DIAGNOSIS — I4821 Permanent atrial fibrillation: Secondary | ICD-10-CM | POA: Diagnosis present

## 2018-09-28 DIAGNOSIS — R0902 Hypoxemia: Secondary | ICD-10-CM | POA: Diagnosis present

## 2018-09-28 DIAGNOSIS — I083 Combined rheumatic disorders of mitral, aortic and tricuspid valves: Secondary | ICD-10-CM | POA: Diagnosis present

## 2018-09-28 DIAGNOSIS — E43 Unspecified severe protein-calorie malnutrition: Secondary | ICD-10-CM | POA: Diagnosis present

## 2018-09-28 DIAGNOSIS — R64 Cachexia: Secondary | ICD-10-CM | POA: Diagnosis present

## 2018-09-28 DIAGNOSIS — Z950 Presence of cardiac pacemaker: Secondary | ICD-10-CM | POA: Diagnosis not present

## 2018-09-28 DIAGNOSIS — Z87891 Personal history of nicotine dependence: Secondary | ICD-10-CM | POA: Diagnosis not present

## 2018-09-28 DIAGNOSIS — R296 Repeated falls: Secondary | ICD-10-CM | POA: Diagnosis present

## 2018-09-28 DIAGNOSIS — Z8619 Personal history of other infectious and parasitic diseases: Secondary | ICD-10-CM | POA: Diagnosis not present

## 2018-09-28 DIAGNOSIS — I5021 Acute systolic (congestive) heart failure: Secondary | ICD-10-CM | POA: Diagnosis present

## 2018-09-28 DIAGNOSIS — J918 Pleural effusion in other conditions classified elsewhere: Secondary | ICD-10-CM | POA: Diagnosis present

## 2018-09-28 DIAGNOSIS — I361 Nonrheumatic tricuspid (valve) insufficiency: Secondary | ICD-10-CM | POA: Diagnosis not present

## 2018-09-28 DIAGNOSIS — Z886 Allergy status to analgesic agent status: Secondary | ICD-10-CM

## 2018-09-28 DIAGNOSIS — I110 Hypertensive heart disease with heart failure: Principal | ICD-10-CM | POA: Diagnosis present

## 2018-09-28 DIAGNOSIS — I340 Nonrheumatic mitral (valve) insufficiency: Secondary | ICD-10-CM | POA: Diagnosis not present

## 2018-09-28 DIAGNOSIS — I351 Nonrheumatic aortic (valve) insufficiency: Secondary | ICD-10-CM | POA: Diagnosis not present

## 2018-09-28 DIAGNOSIS — Z681 Body mass index (BMI) 19 or less, adult: Secondary | ICD-10-CM | POA: Diagnosis not present

## 2018-09-28 DIAGNOSIS — I509 Heart failure, unspecified: Secondary | ICD-10-CM

## 2018-09-28 DIAGNOSIS — Z888 Allergy status to other drugs, medicaments and biological substances status: Secondary | ICD-10-CM

## 2018-09-28 DIAGNOSIS — Z9889 Other specified postprocedural states: Secondary | ICD-10-CM

## 2018-09-28 DIAGNOSIS — J810 Acute pulmonary edema: Secondary | ICD-10-CM

## 2018-09-28 DIAGNOSIS — D638 Anemia in other chronic diseases classified elsewhere: Secondary | ICD-10-CM | POA: Diagnosis present

## 2018-09-28 DIAGNOSIS — R627 Adult failure to thrive: Secondary | ICD-10-CM | POA: Diagnosis present

## 2018-09-28 DIAGNOSIS — Z7983 Long term (current) use of bisphosphonates: Secondary | ICD-10-CM

## 2018-09-28 HISTORY — DX: Pleural effusion, not elsewhere classified: J90

## 2018-09-28 HISTORY — DX: Nonrheumatic aortic (valve) insufficiency: I35.1

## 2018-09-28 HISTORY — DX: Repeated falls: R29.6

## 2018-09-28 HISTORY — DX: Personal history of other diseases of the digestive system: Z87.19

## 2018-09-28 HISTORY — DX: Anemia, unspecified: D64.9

## 2018-09-28 HISTORY — DX: Sick sinus syndrome: I49.5

## 2018-09-28 HISTORY — DX: Orthostatic hypotension: I95.1

## 2018-09-28 HISTORY — DX: Nonrheumatic mitral (valve) insufficiency: I34.0

## 2018-09-28 HISTORY — DX: Noninfective gastroenteritis and colitis, unspecified: K52.9

## 2018-09-28 HISTORY — DX: Rheumatic tricuspid insufficiency: I07.1

## 2018-09-28 HISTORY — DX: Other persistent atrial fibrillation: I48.19

## 2018-09-28 HISTORY — DX: Do not resuscitate: Z66

## 2018-09-28 LAB — I-STAT TROPONIN, ED: Troponin i, poc: 0 ng/mL (ref 0.00–0.08)

## 2018-09-28 LAB — BASIC METABOLIC PANEL
Anion gap: 6 (ref 5–15)
BUN: 13 mg/dL (ref 8–23)
CO2: 28 mmol/L (ref 22–32)
Calcium: 9.3 mg/dL (ref 8.9–10.3)
Chloride: 105 mmol/L (ref 98–111)
Creatinine, Ser: 0.94 mg/dL (ref 0.44–1.00)
GFR calc Af Amer: >60 mL/min (ref >60)
GFR calc non Af Amer: 53 mL/min — ABNORMAL LOW (ref >60)
GLUCOSE: 103 mg/dL — AB (ref 70–99)
POTASSIUM: 2.7 mmol/L — AB (ref 3.5–5.1)
Sodium: 139 mmol/L (ref 135–145)

## 2018-09-28 LAB — CBC WITH DIFFERENTIAL/PLATELET
ABS IMMATURE GRANULOCYTES: 0.02 10*3/uL (ref 0.00–0.07)
BASOS PCT: 1 %
Basophils Absolute: 0.1 10*3/uL (ref 0.0–0.1)
EOS ABS: 0.5 10*3/uL (ref 0.0–0.5)
Eosinophils Relative: 8 %
HEMATOCRIT: 37.6 % (ref 36.0–46.0)
Hemoglobin: 11.7 g/dL — ABNORMAL LOW (ref 12.0–15.0)
Immature Granulocytes: 0 %
LYMPHS ABS: 1.4 10*3/uL (ref 0.7–4.0)
Lymphocytes Relative: 24 %
MCH: 28.7 pg (ref 26.0–34.0)
MCHC: 31.1 g/dL (ref 30.0–36.0)
MCV: 92.2 fL (ref 80.0–100.0)
MONO ABS: 0.7 10*3/uL (ref 0.1–1.0)
MONOS PCT: 11 %
Neutro Abs: 3.3 10*3/uL (ref 1.7–7.7)
Neutrophils Relative %: 56 %
Platelets: 197 10*3/uL (ref 150–400)
RBC: 4.08 MIL/uL (ref 3.87–5.11)
RDW: 14.5 % (ref 11.5–15.5)
WBC: 5.9 10*3/uL (ref 4.0–10.5)
nRBC: 0 % (ref 0.0–0.2)

## 2018-09-28 LAB — TROPONIN I
Troponin I: <0.03 ng/mL (ref <0.03)
Troponin I: <0.03 ng/mL (ref <0.03)
Troponin I: <0.03 ng/mL (ref <0.03)

## 2018-09-28 LAB — TSH: TSH: 1.826 u[IU]/mL (ref 0.350–4.500)

## 2018-09-28 LAB — PROTIME-INR
INR: 1.45
Prothrombin Time: 17.5 seconds — ABNORMAL HIGH (ref 11.4–15.2)

## 2018-09-28 LAB — MAGNESIUM: MAGNESIUM: 1.5 mg/dL — AB (ref 1.7–2.4)

## 2018-09-28 LAB — BRAIN NATRIURETIC PEPTIDE: B Natriuretic Peptide: 828.6 pg/mL — ABNORMAL HIGH (ref 0.0–100.0)

## 2018-09-28 MED ORDER — ONDANSETRON HCL 4 MG/2ML IJ SOLN: 4.0000 mg | Freq: Four times a day (QID) | INTRAMUSCULAR | Status: DC | PRN

## 2018-09-28 MED ORDER — FUROSEMIDE 10 MG/ML IJ SOLN
40.0000 mg | Freq: Two times a day (BID) | INTRAMUSCULAR | Status: DC
  Administered 2018-09-28 – 2018-10-01 (×6): 40 mg via INTRAVENOUS
  Filled 2018-09-28 (×6): qty 4

## 2018-09-28 MED ORDER — ENOXAPARIN SODIUM 40 MG/0.4ML ~~LOC~~ SOLN
40.0000 mg | SUBCUTANEOUS | Status: DC
  Filled 2018-09-28 (×2): qty 0.4

## 2018-09-28 MED ORDER — FUROSEMIDE 10 MG/ML IJ SOLN
40.0000 mg | Freq: Once | INTRAMUSCULAR | Status: AC
  Administered 2018-09-28: 40 mg via INTRAVENOUS
  Filled 2018-09-28: qty 4

## 2018-09-28 MED ORDER — ACETAMINOPHEN 325 MG PO TABS
650.0000 mg | ORAL_TABLET | ORAL | Status: DC | PRN
  Administered 2018-09-28 – 2018-10-01 (×6): 650 mg via ORAL
  Filled 2018-09-28 (×6): qty 2

## 2018-09-28 MED ORDER — SODIUM CHLORIDE 0.9% FLUSH
3.0000 mL | INTRAVENOUS | Status: DC | PRN
  Administered 2018-09-28: 3 mL via INTRAVENOUS
  Filled 2018-09-28: qty 3

## 2018-09-28 MED ORDER — DILTIAZEM HCL ER BEADS 180 MG PO CP24: 180.0000 mg | ORAL_CAPSULE | Freq: Every day | ORAL | Status: DC

## 2018-09-28 MED ORDER — METOPROLOL TARTRATE 25 MG PO TABS
25.0000 mg | ORAL_TABLET | Freq: Two times a day (BID) | ORAL | Status: DC
  Administered 2018-09-28 – 2018-10-01 (×7): 25 mg via ORAL
  Filled 2018-09-28 (×7): qty 1

## 2018-09-28 MED ORDER — AMIODARONE HCL 100 MG PO TABS
100.0000 mg | ORAL_TABLET | Freq: Every day | ORAL | Status: DC
  Administered 2018-09-28 – 2018-09-29 (×2): 100 mg via ORAL
  Filled 2018-09-28 (×2): qty 1

## 2018-09-28 MED ORDER — ORAL CARE MOUTH RINSE
15.0000 mL | Freq: Two times a day (BID) | OROMUCOSAL | Status: DC
  Administered 2018-09-28 – 2018-10-01 (×6): 15 mL via OROMUCOSAL

## 2018-09-28 MED ORDER — LOPERAMIDE HCL 2 MG PO CAPS
4.0000 mg | ORAL_CAPSULE | Freq: Every day | ORAL | Status: DC
  Administered 2018-09-28 – 2018-10-01 (×4): 4 mg via ORAL
  Filled 2018-09-28 (×4): qty 2

## 2018-09-28 MED ORDER — POTASSIUM CHLORIDE CRYS ER 20 MEQ PO TBCR
40.0000 meq | EXTENDED_RELEASE_TABLET | Freq: Once | ORAL | Status: AC
  Administered 2018-09-28: 40 meq via ORAL
  Filled 2018-09-28: qty 2

## 2018-09-28 MED ORDER — ORAL CARE MOUTH RINSE: 15.0000 mL | Freq: Two times a day (BID) | OROMUCOSAL | Status: DC

## 2018-09-28 MED ORDER — DIPHENOXYLATE-ATROPINE 2.5-0.025 MG PO TABS: 2.0000 | ORAL_TABLET | Freq: Four times a day (QID) | ORAL | Status: DC | PRN

## 2018-09-28 MED ORDER — WARFARIN - PHARMACIST DOSING INPATIENT
Freq: Every day | Status: DC
  Administered 2018-09-29 – 2018-09-30 (×2)

## 2018-09-28 MED ORDER — POTASSIUM CHLORIDE 10 MEQ/100ML IV SOLN
10.0000 meq | Freq: Once | INTRAVENOUS | Status: AC
  Administered 2018-09-28: 10 meq via INTRAVENOUS
  Filled 2018-09-28: qty 100

## 2018-09-28 MED ORDER — SODIUM CHLORIDE 0.9 % IV SOLN
250.0000 mL | INTRAVENOUS | Status: DC | PRN
  Administered 2018-09-29: 250 mL via INTRAVENOUS

## 2018-09-28 MED ORDER — DILTIAZEM HCL ER COATED BEADS 180 MG PO CP24
180.0000 mg | ORAL_CAPSULE | Freq: Every day | ORAL | Status: DC
  Administered 2018-09-28 – 2018-10-01 (×4): 180 mg via ORAL
  Filled 2018-09-28 (×4): qty 1

## 2018-09-28 MED ORDER — ASPIRIN EC 81 MG PO TBEC
81.0000 mg | DELAYED_RELEASE_TABLET | Freq: Every day | ORAL | Status: DC
  Administered 2018-09-28 – 2018-10-01 (×4): 81 mg via ORAL
  Filled 2018-09-28 (×4): qty 1

## 2018-09-28 MED ORDER — SODIUM CHLORIDE 0.9% FLUSH
3.0000 mL | Freq: Two times a day (BID) | INTRAVENOUS | Status: DC
  Administered 2018-09-28 – 2018-10-01 (×7): 3 mL via INTRAVENOUS

## 2018-09-28 MED ORDER — WARFARIN SODIUM 5 MG PO TABS
5.0000 mg | ORAL_TABLET | Freq: Once | ORAL | Status: AC
  Administered 2018-09-28: 5 mg via ORAL
  Filled 2018-09-28 (×2): qty 1

## 2018-09-28 NOTE — ED Triage Notes (Signed)
BIB EMS from home, pt reports SOB 3 days with cough. Pt reports having PNA back in August. Sats upper 80's upon arrival, afebrile.

## 2018-09-28 NOTE — Evaluation (Signed)
Physical Therapy Evaluation Patient Details Name: Carol AmorSara K Coppedge MRN: [id number] DOB: 04/11/1929 Today's Date: 09/28/2018   History of Present Illness  84 yo admitted from home with SOB. PMhx: Afib, CHF, CVA, pacemaker  Clinical Impression  Pt pleasant and willing to mobilize. [facility] and perform pericare with supervision. Pt walked in room with RW but limited by fatigue and required 3L throughout to maintain 92-95% with activity with HR 84-99. Per family pt has 24hr assist of 3 daughters and all equipment other than elongated 3in 1. Pt with decreased gait, activity tolerance and cardiopulmonary function who will benefit from acute therapy to maximize mobility and gait to decrease burden of care.     Follow Up Recommendations Home health PT;Supervision for mobility/OOB    Equipment Recommendations  3in1 (PT) elongated seat   Recommendations for Other Services       Precautions / Restrictions Precautions Precautions: Fall Precaution Comments: watch sats      Mobility  Bed Mobility Overal bed mobility: Modified Independent                Transfers Overall transfer level: Needs assistance   Transfers: Sit to/from Stand;Stand Pivot Transfers Sit to Stand: Supervision Stand pivot transfers: Supervision       General transfer comment: cues for hand placement and direction to turn to not tangle O2. Pt stood from bed, BSC x 2, [facility]  Ambulation/Gait Ambulation/Gait assistance: Min guard Gait Distance (Feet): 20 Feet Assistive device: Rolling walker (2 wheeled) Gait Pattern/deviations: Step-through pattern;Decreased stride length;Trunk flexed   Gait velocity interpretation: 1.31 - 2.62 ft/sec, indicative of limited community ambulator General Gait Details: cues for posture and position in RW, pt denied increased distance this date  Information systems managertairs            Wheelchair Mobility    Modified Rankin (Stroke Patients Only)        Balance Overall balance assessment: History of Falls                                           Pertinent Vitals/Pain Pain Assessment: No/denies pain    Home Living Family/patient expects to be discharged to:: Private residence Living Arrangements: Children Available Help at Discharge: Family;Available 24 hours/day Type of Home: House Home Access: Ramped entrance     Home Layout: One level Home Equipment: Walker - 2 wheels;Walker - 4 wheels;Cane - single point;Wheelchair - [employer]manual;Bedside commode;Shower seat      Prior Function Level of Independence: Needs assistance      ADL's / Homemaking Assistance Needed: family does the homemaking, sponge bathes  Comments: pt ambulates with RW or uses WC in home, depends on how she feels and what family member is helping her. Can walk grossly 140' with RW at times.      Hand Dominance        Extremity/Trunk Assessment   Upper Extremity Assessment Upper Extremity Assessment: Generalized weakness    Lower Extremity Assessment Lower Extremity Assessment: Generalized weakness    Cervical / Trunk Assessment Cervical / Trunk Assessment: Kyphotic  Communication   Communication: No difficulties  Cognition Arousal/Alertness: Awake/alert Behavior During Therapy: WFL for tasks assessed/performed Overall Cognitive Status: Within Functional Limits for tasks assessed  General Comments      Exercises     Assessment/Plan    PT Assessment Patient needs continued PT services  PT Problem List Decreased strength;Decreased mobility;Decreased activity tolerance;Decreased knowledge of use of DME;Cardiopulmonary status limiting activity       PT Treatment Interventions Gait training;Therapeutic activities;Therapeutic exercise;DME instruction;Functional mobility training;Balance training;Patient/family education    PT Goals (Current goals can be found in the Care  Plan section)  Acute Rehab PT Goals Patient Stated Goal: return home PT Goal Formulation: With patient/family Time For Goal Achievement: 10/12/18 Potential to Achieve Goals: Good    Frequency Min 3X/week   Barriers to discharge        Co-evaluation               AM-PAC PT "6 Clicks" Daily Activity  Outcome Measure Difficulty turning over in bed (including adjusting bedclothes, sheets and blankets)?: A Little Difficulty moving from lying on back to sitting on the side of the bed? : A Little Difficulty sitting down on and standing up from a chair with arms (e.g., wheelchair, bedside commode, etc,.)?: A Little Help needed moving to and from a bed to chair (including a wheelchair)?: A Little Help needed walking in hospital room?: A Little Help needed climbing 3-5 steps with a railing? : A Lot 6 Click Score: 17    End of Session Equipment Utilized During Treatment: Oxygen Activity Tolerance: Patient tolerated treatment well Patient left: in chair;with call bell/phone within reach;with chair alarm set;with family/visitor present Nurse Communication: Mobility status PT Visit Diagnosis: Other abnormalities of gait and mobility (R26.89);Muscle weakness (generalized) (M62.81)    Time: 4034-7425 PT Time Calculation (min) (ACUTE ONLY): 27 min   Charges:   PT Evaluation $PT Eval Moderate Complexity: 1 Mod          Jennie Bolar Abner Greenspan, PT Acute Rehabilitation Services Pager: [phone] Office: [phone]   Analeah Brame B Dewitte Vannice 09/28/2018, 2:02 PM

## 2018-09-28 NOTE — H&P (Signed)
History and Physical    Carol AmorSara K Reha ZOX:[id number] DOB: [birth year] DOA: 09/28/2018  PCP: Kaleen MaskElkins, Wilson Oliver, MD Consultants:  Rudolpho SevinAkbary - cardiology; Evette CristalGanem - GI Patient coming from:  Home - lives with alternating relatives and caregivers; NOK: Daughter, [phone]  Chief Complaint: SOB  HPI: Carol AmorSara K Snyder is a 84 y.o. female with medical history significant of CVA; pacemaker placement; [facility].  7/19 Echo with EF 50-55%.  She started yesterday AM with not feeling well.  She went to PCP, complaining of LE edema and weakness, fatigue, sweaty, SOB.  She had an EKG and he increased her Lasix.  She felt ok when she got home and she went to bed.  She was having chest discomfort and had difficulty sleeping.  She continued to be SOB.  +cough, productive of sputum that felt like it was draining from her nose/throat, clear watery mucous.  No fever.   ED Course: Carryover, per Dr. Julian ReilGardner: 84 yo F with CHF exacerbation. Some SOB, hypoxia. DNR. Saw PCP yesterday, got lasix increased yesterday. Today K 2.7. CXR shows B pleural effusions today. Got PO and IV K today, IV lasix 40mg  today in ED.  Review of Systems: As per HPI; otherwise review of systems reviewed and negative.   Ambulatory Status:  Ambulates with a walker  Past Medical History:  Diagnosis Date  . Atrial fibrillation (HCC)    on Coumadin  . Chest pain   . History of shingles   . Long term current use of anticoagulant   . Pacemaker   . Stroke [facility](HCC)     Past Surgical History:  Procedure Laterality Date  . APPENDECTOMY    . CHOLECYSTECTOMY     with partial colectomy do to gallbadder infection   . IR THORACENTESIS ASP PLEURAL SPACE W/IMG GUIDE  06/22/2018    Social History   Socioeconomic History  . Marital status: Single    Spouse name: Not on file  . Number of children: Not on file  . Years of education: Not on file  . Highest education level: Not on file  Occupational History  . Not on  file  Social Needs  . Financial resource strain: Not on file  . Food insecurity:    Worry: Not on file    Inability: Not on file  . Transportation needs:    Medical: Not on file    Non-medical: Not on file  Tobacco Use  . Smoking status: Former Smoker    Last attempt to quit: 11/17/2004    Years since quitting: 13.8  . Smokeless tobacco: Never Used  Substance and Sexual Activity  . Alcohol use: No  . Drug use: No  . Sexual activity: Not on file  Lifestyle  . Physical activity:    Days per week: Not on file    Minutes per session: Not on file  . Stress: Not on file  Relationships  . Social connections:    Talks on phone: Not on file    Gets together: Not on file    Attends religious service: Not on file    Active member of club or organization: Not on file    Attends meetings of clubs or organizations: Not on file    Relationship status: Not on file  . Intimate partner violence:    Fear of current or ex partner: Not on file    Emotionally abused: Not on file    Physically abused: Not on file  Forced sexual activity: Not on file  Other Topics Concern  . Not on file  Social History Narrative  . Not on file    Allergies  Allergen Reactions  . Codeine Other (See Comments)    unknown  . Enbrel [Etanercept] Other (See Comments)    unknown  . Fosamax [Alendronate Sodium] Other (See Comments)    unknown  . Hydromorphone Other (See Comments)    unknown  . Motrin [Ibuprofen] Other (See Comments)    Due to blood thinner   . Sulfa Antibiotics Other (See Comments)    Childhood allergy   . Voltaren [Diclofenac] Other (See Comments)    unknown  . Naproxen Sodium Palpitations    Family History  Problem Relation Age of Onset  . Heart attack Father   . Hypertension Father   . Stroke Mother   . Cancer Brother        lung  . Arrhythmia Daughter        heart palpitations    Prior to Admission medications   Medication Sig Start Date End Date Taking? Authorizing  Provider  acetaminophen (TYLENOL) 325 MG tablet Take 650 mg by mouth every 4 (four) hours as needed for mild pain or fever (or if temp is 99.0 or greater for 72 hours.).   Yes [provider]  alendronate (FOSAMAX) 70 MG tablet Take 70 mg by mouth once a week. Take with a full glass of water on an empty stomach.   Yes [provider]  amiodarone (PACERONE) 100 MG tablet Take 100 mg by mouth daily.   Yes [provider]  diltiazem (TIAZAC) 180 MG 24 hr capsule Take 180 mg by mouth daily.   Yes [provider]  diphenoxylate-atropine (LOMOTIL) 2.5-0.025 MG tablet Take 1 tablet by mouth 4 (four) times daily as needed for diarrhea or loose stools. Patient taking differently: Take 2 tablets by mouth 4 (four) times daily as needed for diarrhea or loose stools.  06/09/18  Yes Tyrone Nine, MD  doxycycline (ADOXA) 100 MG tablet Take 50 mg by mouth daily.   Yes [provider]  furosemide (LASIX) 20 MG tablet Take 1-2 tablets (20-40 mg total) by mouth daily. Take 40mg  daily alternating with 20mg  daily Patient taking differently: Take 40 mg by mouth daily.  06/24/18  Yes Zannie Cove, MD  hydroxypropyl methylcellulose / hypromellose (ISOPTO TEARS / GONIOVISC) 2.5 % ophthalmic solution Place 1 drop into both eyes 3 (three) times daily as needed for dry eyes.   Yes [provider]  loperamide (IMODIUM) 2 MG capsule Take 4 mg by mouth daily.   Yes [provider]  Menthol, Topical Analgesic, (BIOFREEZE EX) Apply 1 application topically daily as needed (pain).   Yes [provider]  metoprolol tartrate (LOPRESSOR) 50 MG tablet Take 25 mg by mouth 2 (two) times daily.    Yes [provider]  warfarin (COUMADIN) 2.5 MG tablet Take 2.5 mg by mouth See admin instructions. Take 1 table for two days then skip a day and repeat.   Yes [provider]    Physical Exam: Vitals:   09/28/18 0930 09/28/18 1000 09/28/18 1030 09/28/18  1256  BP: (!) 149/81 (!) 159/87 (!) 143/99 (!) 163/89  Pulse: 81 88 90 93  Resp: 16 20 (!) 22 18  Temp:    97.7 F (36.5 C)  TempSrc:    Oral  SpO2: 92% 95% 95% 92%  Weight:      Height:  General:  Appears calm and comfortable and is NAD Eyes:  PERRL, EOMI, normal lids, iris ENT:  grossly normal hearing, lips & tongue, mmm; artificial dentition Neck:  no LAD, masses or thyromegaly; no carotid bruits Cardiovascular:  RRR, no m/r/g. 1-2+ LE edema.  Respiratory:   CTA bilaterally with scant bibasilar crackles.  Normal respiratory effort. Abdomen:  soft, NT, ND, NABS Skin:  no rash or induration seen on limited exam Musculoskeletal:  grossly normal tone BUE/BLE, good ROM, no bony abnormality Psychiatric:  grossly normal mood and affect, speech fluent and appropriate, AOx3 Neurologic:  CN 2-12 grossly intact, moves all extremities in coordinated fashion, sensation intact    Radiological Exams on Admission: Dg Chest 2 View  Result Date: 09/28/2018 CLINICAL DATA:  Acute onset of severe shortness of breath. EXAM: CHEST - 2 VIEW COMPARISON:  Chest radiograph performed 06/24/2018 FINDINGS: An enlarging moderate to large right-sided pleural effusion is noted. A small left pleural effusion is again seen. Vascular congestion is noted. Worsening bilateral airspace opacification, particularly on the right, may reflect asymmetric pulmonary edema. The cardiomediastinal silhouette is enlarged. A pacemaker is noted at the left chest wall, with leads ending at the right atrium and right ventricle. No acute osseous abnormalities are seen. IMPRESSION: 1. Enlarging moderate to large right-sided pleural effusion. Small left pleural effusion again seen. 2. Vascular congestion and cardiomegaly. Worsening bilateral airspace opacification, particularly on the right, may reflect asymmetric pulmonary edema. Electronically Signed   By: Roanna Raider M.D.   On: 09/28/2018 05:52    EKG: Independently  reviewed.  Atrial flutter with rate 78; IVCD, LVH; nonspecific ST changes   Labs on Admission: I have personally reviewed the available labs and imaging studies at the time of the admission.  Pertinent labs:   K+ 2.7 Glucose 103 BNP 828.6; 782.3 on 8/4 Troponin 0.00 WBC 5.9 Hgb 11.7   Assessment/Plan Principal Problem:   Acute on chronic systolic CHF (congestive heart failure) (HCC) Active Problems:   Atrial fibrillation, chronic   Chronic anticoagulation   Pleural effusion   Hypokalemia   Acute on chronic systolic CHF -Patient with prior admission for similar presentation in 8/19 presenting with worsening SOB and hypoxia to 89% -CXR consistent with pulmonary edema as well as worsening right-sided pleural effusion (previously drained in 8/19) -Normal WBC count; will not give antibiotics at this time -Elevated BNP -With elevated BNP and abnl CXR, recurrent CHF seems most probable as diagnosis -Will admit with telemetry -Will request repeat echocardiogram -Will start ASA -Will continue beta blocker -No ACE due to renal dysfunction -CHF order set utilized; may need CHF team consult but will hold until Echo results are available -Was given Lasix 40 mg x 1 in ER and will repeat with 40 mg IV BID for now -Continue [place] O2 for now -Stable kidney function at this time, will follow -Repeat EKG in AM -Troponin negative x 1, low suspicion for ACS  Afib on Coumadin -Rate controlled with Cardizem, metoprolol, and Amiodarone -Continue Coumadin dosing per pharmacy - although may have to hold for thoracentesis  Pleural effusion -Enlarging moderate to large right-sided pleural effusion -She is likely to benefit from thoracentesis - although IR may prefer to hold Coumadin and wait to perform procedure; consult placed  Hypokalemia -Repleted in ER qith IV KCl and 40 mEq PO Kcl - which would be expected to increase K+ from 2.7 to 3.2. -Will add an additional 40 mEq PO KCl this  evening.  -Will follow.   -Will  check Mag level.    DVT prophylaxis: Coumadin Code Status:  DNR - confirmed with patient/family Family Communication: Daughter present throughout evaluation  Disposition Plan:  Home once clinically improved Consults called: IR; PT; CM  Admission status: Admit - It is my clinical opinion that admission to INPATIENT is reasonable and necessary because of the expectation that this patient will require hospital care that crosses at least 2 midnights to treat this condition based on the medical complexity of the problems presented.  Given the aforementioned information, the predictability of an adverse outcome is felt to be significant.    Jonah Blue MD [facility]  If note is complete, please contact covering daytime or nighttime physician. [email or web address] Password [email or web address]  09/28/2018, 1:56 PM

## 2018-09-28 NOTE — Care Management Note (Addendum)
Case Management Note  Patient Details  Name: Carol Snyder MRN: [id number] Date of Birth: [birth year]  Subjective/Objective:    CHF               Action/Plan: Patient lives at home; PCP: Kaleen Mask, MD; has private insurance with [facility]; Patient's PCP will not sign any HHC orders from the hospital; CM called the PCP office and was instructed to have the patient go to the PCP office after discharge and he will decide if the patient needs it; CM will continue to follow for progression of care.  Expected Discharge Date:   possibly 10/02/2018               Expected Discharge Plan:  Home/Self Care  Discharge planning Services  CM Consult    Status of Service:  In process, will continue to follow  Reola Mosher [phone] 09/28/2018, 3:25 PM

## 2018-09-28 NOTE — ED Provider Notes (Signed)
[facility] EMERGENCY DEPARTMENT Provider Note   CSN: [id number] Arrival date & time: 09/28/18  0442     History   Chief Complaint Chief Complaint  Patient presents with  . Shortness of Breath    HPI Carol Snyder is a 84 y.o. female.  HPI  This is an 84 year old female with a history of atrial fibrillation, heart failure, CVA who presents with shortness of breath.  Patient reports worsening shortness of breath over the last 24 hours.  Shortness of breath is worse with laying flat.  She has had a productive cough.  She denies any fevers.  She saw her primary physician and her Lasix was increased to 40 mg daily.  She denies increase in urinary output with this.  She denies any chest pain.  She has noted some lower extremity swelling.  No nausea, vomiting, diarrhea, abdominal pain.  Past Medical History:  Diagnosis Date  . Atrial fibrillation (HCC)   . Chest pain   . History of shingles   . Long term current use of anticoagulant   . Pacemaker   . Stroke [facility])     Patient Active Problem List   Diagnosis Date Noted  . Pleural effusion 06/20/2018  . Protein-calorie malnutrition, severe 06/07/2018  . Dehydration   . Laceration of right lower leg   . Fall 06/06/2018  . AKI (acute kidney injury) (HCC) 06/06/2018  . Diarrhea 06/06/2018  . Cardiac pacemaker in situ 06/06/2018  . Chronic anticoagulation 06/06/2018  . Atrial fibrillation, chronic 07/04/2011    Past Surgical History:  Procedure Laterality Date  . APPENDECTOMY    . CHOLECYSTECTOMY     with partial colectomy do to gallbadder infection   . IR THORACENTESIS ASP PLEURAL SPACE W/IMG GUIDE  06/22/2018     OB History   None      Home Medications    Prior to Admission medications   Medication Sig Start Date End Date Taking? Authorizing Provider  acetaminophen (TYLENOL) 325 MG tablet Take 650 mg by mouth every 4 (four) hours as needed for mild pain or fever (or if temp is 99.0 or greater for  72 hours.).    [provider]  amiodarone (PACERONE) 100 MG tablet Take 100 mg by mouth daily.    [provider]  diltiazem (TIAZAC) 180 MG 24 hr capsule Take 180 mg by mouth daily.    [provider]  diphenoxylate-atropine (LOMOTIL) 2.5-0.025 MG tablet Take 1 tablet by mouth 4 (four) times daily as needed for diarrhea or loose stools. Patient taking differently: Take 2 tablets by mouth 4 (four) times daily as needed for diarrhea or loose stools.  06/09/18   Tyrone Nine, MD  furosemide (LASIX) 20 MG tablet Take 1-2 tablets (20-40 mg total) by mouth daily. Take 40mg  daily alternating with 20mg  daily 06/24/18   Zannie Cove, MD  hydroxypropyl methylcellulose / hypromellose (ISOPTO TEARS / GONIOVISC) 2.5 % ophthalmic solution Place 1 drop into both eyes 3 (three) times daily as needed for dry eyes.    [provider]  loperamide (IMODIUM) 2 MG capsule Take 4 mg by mouth daily.    [provider]  metoprolol tartrate (LOPRESSOR) 50 MG tablet Take 50 mg by mouth 2 (two) times daily.     [provider]  warfarin (COUMADIN) 2.5 MG tablet Take 2.5 mg by mouth 3 (three) times a week. Mondays, Wednesday, Friday.    [provider]  warfarin (COUMADIN) 3 MG tablet Take 3  mg by mouth 3 (three) times a week. Tuesday, Thursday, Saturday, and Sunday.    [provider]    Family History Family History  Problem Relation Age of Onset  . Heart attack Father   . Hypertension Father   . Stroke Mother   . Cancer Brother        lung  . Arrhythmia Daughter        heart palpitations    Social History Social History   Tobacco Use  . Smoking status: Former Smoker    Last attempt to quit: 11/17/2004    Years since quitting: 13.8  . Smokeless tobacco: Never Used  Substance Use Topics  . Alcohol use: No  . Drug use: No     Allergies   Codeine; Enbrel [etanercept]; Fosamax [alendronate sodium]; Hydromorphone; Motrin [ibuprofen];  Sulfa antibiotics; Voltaren [diclofenac]; and Naproxen sodium   Review of Systems Review of Systems  Constitutional: Negative for fever.  Respiratory: Positive for cough and shortness of breath.   Cardiovascular: Positive for leg swelling. Negative for chest pain.  Gastrointestinal: Negative for abdominal pain, constipation, diarrhea and vomiting.  Genitourinary: Negative for dysuria.  All other systems reviewed and are negative.    Physical Exam Updated Vital Signs BP (!) 151/82   Pulse 85   Temp 98.1 F (36.7 C) (Oral)   Resp 13   Ht 1.702 m (5\' 7" )   Wt 54.4 kg   SpO2 97%   BMI 18.79 kg/m   Physical Exam  Constitutional: She is oriented to person, place, and time.  Elderly, nontoxic-appearing  HENT:  Head: Normocephalic and atraumatic.  Eyes: Pupils are equal, round, and reactive to light.  Neck: Neck supple.  Cardiovascular: Normal rate, regular rhythm and normal heart sounds.  Pulmonary/Chest: Effort normal. No respiratory distress. She has wheezes.  Occasional expiratory wheeze, fair air movement Diminished right lower lung  Abdominal: Soft. Bowel sounds are normal.  Musculoskeletal:       Right lower leg: She exhibits edema.       Left lower leg: She exhibits edema.  1+ bilateral lower extremity edema  Neurological: She is alert and oriented to person, place, and time.  Skin: Skin is warm and dry.  Psychiatric: She has a normal mood and affect.  Nursing note and vitals reviewed.    ED Treatments / Results  Labs (all labs ordered are listed, but only abnormal results are displayed) Labs Reviewed  CBC WITH DIFFERENTIAL/PLATELET - Abnormal; Notable for the following components:      Result Value   Hemoglobin 11.7 (*)    All other components within normal limits  BASIC METABOLIC PANEL - Abnormal; Notable for the following components:   Potassium 2.7 (*)    Glucose, Bld 103 (*)    GFR calc non Af Amer 53 (*)    All other components within normal limits    BRAIN NATRIURETIC PEPTIDE  I-STAT TROPONIN, ED    EKG EKG Interpretation  Date/Time:  Tuesday September 28 2018 04:45:14 EST Ventricular Rate:  78 PR Interval:    QRS Duration: 171 QT Interval:  480 QTC Calculation: 547 R Axis:   -86 Text Interpretation:  Atrial flutter Nonspecific IVCD with LAD LVH with secondary repolarization abnormality Probable inferior infarct, recent Anterior infarct, old Nonspecific ST segment changes Confirmed by Ross Marcus ([id number]) on 09/28/2018 4:51:17 AM   Radiology Dg Chest 2 View  Result Date: 09/28/2018 CLINICAL DATA:  Acute onset of severe shortness of breath. EXAM: CHEST - 2 VIEW  COMPARISON:  Chest radiograph performed 06/24/2018 FINDINGS: An enlarging moderate to large right-sided pleural effusion is noted. A small left pleural effusion is again seen. Vascular congestion is noted. Worsening bilateral airspace opacification, particularly on the right, may reflect asymmetric pulmonary edema. The cardiomediastinal silhouette is enlarged. A pacemaker is noted at the left chest wall, with leads ending at the right atrium and right ventricle. No acute osseous abnormalities are seen. IMPRESSION: 1. Enlarging moderate to large right-sided pleural effusion. Small left pleural effusion again seen. 2. Vascular congestion and cardiomegaly. Worsening bilateral airspace opacification, particularly on the right, may reflect asymmetric pulmonary edema. Electronically Signed   By: Roanna Raider M.D.   On: 09/28/2018 05:52    Procedures Procedures (including critical care time)  CRITICAL CARE Performed by: Shon Baton   Total critical care time: 45 minutes  Critical care time was exclusive of separately billable procedures and treating other patients.  Critical care was necessary to treat or prevent imminent or life-threatening deterioration.  Critical care was time spent personally by me on the following activities: development of treatment plan  with patient and/or surrogate as well as nursing, discussions with consultants, evaluation of patient's response to treatment, examination of patient, obtaining history from patient or surrogate, ordering and performing treatments and interventions, ordering and review of laboratory studies, ordering and review of radiographic studies, pulse oximetry and re-evaluation of patient's condition.   Medications Ordered in ED Medications  potassium chloride SA (K-DUR,KLOR-CON) CR tablet 40 mEq (has no administration in time range)  potassium chloride 10 mEq in 100 mL IVPB (has no administration in time range)  furosemide (LASIX) injection 40 mg (has no administration in time range)     Initial Impression / Assessment and Plan / ED Course  I have reviewed the triage vital signs and the nursing notes.  Pertinent labs & imaging results that were available during my care of the patient were reviewed by me and considered in my medical decision making (see chart for details).     Patient presents with shortness of breath.  She is mildly hypoxic requiring some supplemental oxygen but she is in no respiratory distress.  She does appear volume overloaded with lower extremity edema.  Have 1 dose of increased Lasix yesterday.  She is otherwise nontoxic and afebrile.  Lab work notable for a potassium of 2.7.  This was replaced both orally and IV.  Chest x-ray shows enlarged during right-sided pleural effusion as well as a left-sided pleural effusion with vascular congestion and cardiomegaly.  Concerning for asymmetric pulmonary edema.  Patient was given IV Lasix.  She remains without respiratory distress.  She is a DNR.  I have reviewed her chart.  Last echo showed an EF of 50 to 55% which is lower limits of normal.  He may need repeat echo.  Given her potassium derangement and oxygen requirement, feel she would benefit from diuresis as an inpatient.  As was discussed with Dr. Julian Reil.  Final Clinical  Impressions(s) / ED Diagnoses   Final diagnoses:  Hypoxia  Hypokalemia  Acute pulmonary edema [facility])    ED Discharge Orders    None       Shon Baton, MD 09/28/18 [phone]

## 2018-09-28 NOTE — Progress Notes (Signed)
Pt received to 3e09 from Ed, pt has 3 daughters present, pt has brief in place and family reports she has spells of diarrhea that has been worked up with GI and still unknown cause, they report she had spell in ED, she takes lomotil and immodium both at home, paged Dr Ophelia Charteryates as requested by family, educated on no use of briefs or depends on our unit, purewick placed per family request as they are insistent she needs it with iv lasix, telemetry box 33 applied to pt and called to Marisue IvanLiz in [facility]

## 2018-09-28 NOTE — Progress Notes (Signed)
ANTICOAGULATION CONSULT NOTE - Initial Consult  Pharmacy Consult for warfarin Indication: atrial fibrillation and hx stroke  Allergies  Allergen Reactions  . Codeine Other (See Comments)    unknown  . Enbrel [Etanercept] Other (See Comments)    unknown  . Fosamax [Alendronate Sodium] Other (See Comments)    unknown  . Hydromorphone Other (See Comments)    unknown  . Motrin [Ibuprofen] Other (See Comments)    Due to blood thinner   . Sulfa Antibiotics Other (See Comments)    Childhood allergy   . Voltaren [Diclofenac] Other (See Comments)    unknown  . Naproxen Sodium Palpitations    Patient Measurements: Height: 5\' 7"  (170.2 cm) Weight: 120 lb (54.4 kg) IBW/kg (Calculated) : 61.6  Vital Signs: Temp: 98.1 F (36.7 C) (11/12 0443) Temp Source: Oral (11/12 0443) BP: 140/83 (11/12 0900) Pulse Rate: 80 (11/12 0900)  Labs: Recent Labs    09/28/18 0450  HGB 11.7*  HCT 37.6  PLT 197  CREATININE 0.94    Estimated Creatinine Clearance: 36.2 mL/min (by C-G formula based on SCr of 0.94 mg/dL).   Medical History: Past Medical History:  Diagnosis Date  . Atrial fibrillation (HCC)    on Coumadin  . Chest pain   . History of shingles   . Long term current use of anticoagulant   . Pacemaker   . Stroke [facility])     Medications:  Scheduled:  . amiodarone  100 mg Oral Daily  . aspirin EC  81 mg Oral Daily  . diltiazem  180 mg Oral Daily  . enoxaparin (LOVENOX) injection  40 mg Subcutaneous Q24H  . furosemide  40 mg Intravenous Q12H  . metoprolol tartrate  25 mg Oral BID  . sodium chloride flush  3 mL Intravenous Q12H    Assessment: 89 YOF with hx of afib on warfarin PTA, last dose 11/10, chronic anemia stable, plts 197. On amiodarone 100mg  PTA continued here. INR on admit subtherapeutic at 1.45  PTA dosing: 2.5mg  two days in a row, then no dose on third day, repeated as cycle  Goal of Therapy:  INR 2-3 Monitor platelets by anticoagulation protocol: Yes    Plan:  Warfarin 5 mg PO x 1 tonight Daily INR, s/s bleeding  Daylene Posey, PharmD Clinical Pharmacist Please check AMION for all [facility] Pharmacy numbers 09/28/2018 9:37 AM

## 2018-09-29 ENCOUNTER — Encounter (HOSPITAL_COMMUNITY): Payer: Self-pay | Admitting: Physician Assistant

## 2018-09-29 ENCOUNTER — Inpatient Hospital Stay (HOSPITAL_COMMUNITY): Payer: Medicare Other

## 2018-09-29 DIAGNOSIS — J90 Pleural effusion, not elsewhere classified: Secondary | ICD-10-CM

## 2018-09-29 DIAGNOSIS — Z7901 Long term (current) use of anticoagulants: Secondary | ICD-10-CM

## 2018-09-29 DIAGNOSIS — I4819 Other persistent atrial fibrillation: Secondary | ICD-10-CM

## 2018-09-29 DIAGNOSIS — I340 Nonrheumatic mitral (valve) insufficiency: Secondary | ICD-10-CM

## 2018-09-29 DIAGNOSIS — I5023 Acute on chronic systolic (congestive) heart failure: Secondary | ICD-10-CM

## 2018-09-29 DIAGNOSIS — I351 Nonrheumatic aortic (valve) insufficiency: Secondary | ICD-10-CM

## 2018-09-29 DIAGNOSIS — E876 Hypokalemia: Secondary | ICD-10-CM

## 2018-09-29 DIAGNOSIS — I5033 Acute on chronic diastolic (congestive) heart failure: Secondary | ICD-10-CM

## 2018-09-29 DIAGNOSIS — I361 Nonrheumatic tricuspid (valve) insufficiency: Secondary | ICD-10-CM

## 2018-09-29 HISTORY — PX: IR THORACENTESIS ASP PLEURAL SPACE W/IMG GUIDE: IMG5380

## 2018-09-29 LAB — CBC WITH DIFFERENTIAL/PLATELET
ABS IMMATURE GRANULOCYTES: 0.02 10*3/uL (ref 0.00–0.07)
BASOS PCT: 1 %
Basophils Absolute: 0.1 10*3/uL (ref 0.0–0.1)
EOS ABS: 0.4 10*3/uL (ref 0.0–0.5)
EOS PCT: 6 %
HCT: 34.3 % — ABNORMAL LOW (ref 36.0–46.0)
Hemoglobin: 10.8 g/dL — ABNORMAL LOW (ref 12.0–15.0)
Immature Granulocytes: 0 %
Lymphocytes Relative: 22 %
Lymphs Abs: 1.5 10*3/uL (ref 0.7–4.0)
MCH: 28.7 pg (ref 26.0–34.0)
MCHC: 31.5 g/dL (ref 30.0–36.0)
MCV: 91.2 fL (ref 80.0–100.0)
MONO ABS: 0.8 10*3/uL (ref 0.1–1.0)
Monocytes Relative: 11 %
NEUTROS ABS: 4 10*3/uL (ref 1.7–7.7)
Neutrophils Relative %: 60 %
PLATELETS: 190 10*3/uL (ref 150–400)
RBC: 3.76 MIL/uL — ABNORMAL LOW (ref 3.87–5.11)
RDW: 14.4 % (ref 11.5–15.5)
WBC: 6.7 10*3/uL (ref 4.0–10.5)
nRBC: 0 % (ref 0.0–0.2)

## 2018-09-29 LAB — BASIC METABOLIC PANEL
Anion gap: 6 (ref 5–15)
BUN: 13 mg/dL (ref 8–23)
CHLORIDE: 107 mmol/L (ref 98–111)
CO2: 29 mmol/L (ref 22–32)
CREATININE: 0.85 mg/dL (ref 0.44–1.00)
Calcium: 8.5 mg/dL — ABNORMAL LOW (ref 8.9–10.3)
GFR calc Af Amer: >60 mL/min (ref >60)
GFR calc non Af Amer: 60 mL/min — ABNORMAL LOW (ref >60)
Glucose, Bld: 95 mg/dL (ref 70–99)
Potassium: 2.8 mmol/L — ABNORMAL LOW (ref 3.5–5.1)
Sodium: 142 mmol/L (ref 135–145)

## 2018-09-29 LAB — ECHOCARDIOGRAM COMPLETE
HEIGHTINCHES: 67 in
WEIGHTICAEL: 1667.2 [oz_av]

## 2018-09-29 LAB — MAGNESIUM: Magnesium: 1.5 mg/dL — ABNORMAL LOW (ref 1.7–2.4)

## 2018-09-29 LAB — PROTIME-INR
INR: 1.42
PROTHROMBIN TIME: 17.2 s — AB (ref 11.4–15.2)

## 2018-09-29 MED ORDER — MAGNESIUM SULFATE 2 GM/50ML IV SOLN
2.0000 g | Freq: Once | INTRAVENOUS | Status: AC
  Administered 2018-09-29: 2 g via INTRAVENOUS
  Filled 2018-09-29: qty 50

## 2018-09-29 MED ORDER — ENSURE ENLIVE PO LIQD
237.0000 mL | Freq: Three times a day (TID) | ORAL | Status: DC
  Administered 2018-09-30 – 2018-10-01 (×2): 237 mL via ORAL

## 2018-09-29 MED ORDER — POTASSIUM CHLORIDE CRYS ER 20 MEQ PO TBCR
40.0000 meq | EXTENDED_RELEASE_TABLET | ORAL | Status: AC
  Administered 2018-09-29 (×2): 40 meq via ORAL
  Filled 2018-09-29 (×2): qty 2

## 2018-09-29 MED ORDER — WARFARIN SODIUM 5 MG PO TABS
5.0000 mg | ORAL_TABLET | Freq: Once | ORAL | Status: AC
  Administered 2018-09-29: 5 mg via ORAL
  Filled 2018-09-29: qty 1

## 2018-09-29 MED ORDER — LIDOCAINE HCL (PF) 1 % IJ SOLN
INTRAMUSCULAR | Status: DC | PRN
  Administered 2018-09-29: 10 mL

## 2018-09-29 MED ORDER — LIDOCAINE HCL 1 % IJ SOLN
INTRAMUSCULAR | Status: AC
  Filled 2018-09-29: qty 20

## 2018-09-29 NOTE — Progress Notes (Signed)
ANTICOAGULATION CONSULT NOTE  Pharmacy Consult for warfarin Indication: atrial fibrillation and hx stroke  Allergies  Allergen Reactions  . Codeine Other (See Comments)    unknown  . Enbrel [Etanercept] Other (See Comments)    unknown  . Fosamax [Alendronate Sodium] Other (See Comments)    unknown  . Hydromorphone Other (See Comments)    unknown  . Motrin [Ibuprofen] Other (See Comments)    Due to blood thinner   . Sulfa Antibiotics Other (See Comments)    Childhood allergy   . Voltaren [Diclofenac] Other (See Comments)    unknown  . Naproxen Sodium Palpitations    Patient Measurements: Height: 5\' 7"  (170.2 cm) Weight: 104 lb 3.2 oz (47.3 kg)(scale a) IBW/kg (Calculated) : 61.6  Vital Signs: Temp: 97.8 F (36.6 C) (11/13 0347) Temp Source: Oral (11/13 0347) BP: 126/81 (11/13 0347) Pulse Rate: 80 (11/13 0347)  Labs: Recent Labs    09/28/18 0450 09/28/18 0928 09/28/18 0945 09/28/18 1430 09/28/18 2138 09/29/18 0406  HGB 11.7*  --   --   --   --  10.8*  HCT 37.6  --   --   --   --  34.3*  PLT 197  --   --   --   --  190  LABPROT  --   --  17.5*  --   --  17.2*  INR  --   --  1.45  --   --  1.42  CREATININE 0.94  --   --   --   --  0.85  TROPONINI  --  <0.03  --  <0.03 <0.03  --     Estimated Creatinine Clearance: 34.8 mL/min (by C-G formula based on SCr of 0.85 mg/dL).   Assessment: [age] YOF with hx of afib on warfarin PTA, last dose 11/10, chronic anemia stable, plts 197. On amiodarone 100mg  PTA continued here.  INR subtherapeutic at 1.42, Hgb low stable, platelets are normal.  PTA dosing: 2.5mg  two days in a row, then no dose on third day, repeated as cycle  Goal of Therapy:  INR 2-3 Monitor platelets by anticoagulation protocol: Yes   Plan:  Warfarin 5 mg PO tonight Daily INR Monitor for s/sx bleeding Consider holding warfarin if thoracentesis not done today   Loura BackJennifer [place], PharmD, BCPS Clinical Pharmacist Clinical phone for 09/29/2018  until 3p is [phone] 09/29/2018 10:07 AM  **Pharmacist phone directory can now be found on [email or web address] listed under [facility]**

## 2018-09-29 NOTE — Plan of Care (Signed)
  Problem: Activity: Goal: Risk for activity intolerance will decrease Outcome: Progressing   Problem: Elimination: Goal: Will not experience complications related to bowel motility Outcome: Progressing   Problem: Safety: Goal: Ability to remain free from injury will improve Outcome: Progressing   

## 2018-09-29 NOTE — Procedures (Signed)
PROCEDURE SUMMARY:  Successful US guided right thoracentesis. Yielded 1.2 L of clear yellow fluid. Patient tolerated procedure well. No immediate complications.  Post procedure chest X-ray reveals no pneumothorax  Willmer Fellers S Lloyd Ayo PA-C 09/29/2018 1:49 PM

## 2018-09-29 NOTE — Progress Notes (Signed)
  Echocardiogram 2D Echocardiogram has been performed.  Carol Snyder  Carol Snyder 09/29/2018, 3:03 PM

## 2018-09-29 NOTE — Progress Notes (Signed)
PROGRESS NOTE   Carol Snyder  ZOX:[id number]    DOB: 05-17-1929    DOA: 09/28/2018  PCP: Kaleen Mask, MD   I have briefly reviewed patients previous medical records in [facility].  Brief Narrative:  84 year old female with PMH of persistent A. fib on warfarin, SSS status post PPM, IBS, HTN, GI bleed, CVA, orthostatic hypotension, chronic diastolic CHF, frequent falls, chronic diarrhea, hospitalized in July 2019 for falls due to orthostasis, acute on chronic diarrhea, acute kidney injury and then in August 2019 for bilateral pleural effusions requiring thoracentesis, presented with worsening dyspnea, orthopnea and lower extremity edema without chest pain.  Admitted for acute on chronic diastolic CHF with recurrent pleural effusion.  IR and cardiology consulted.  Status post right thoracentesis 11/13.  Assessment & Plan:   Principal Problem:   Acute on chronic systolic CHF (congestive heart failure) (HCC) Active Problems:   Atrial fibrillation, chronic   Chronic anticoagulation   Pleural effusion   Hypokalemia   1. Acute on chronic diastolic CHF: As per family, patient does not check weight daily, compliant with low-salt diet, on Lasix 20 mg daily (advised by cardiology to take 20 mg 3 times a week and by PCP 20 mg daily).  Cardiology consulted and provided additional dose of IV Lasix this evening and hopefully should transition to Lasix 40 mg daily tomorrow.  Improving.  Follows outpatient with cardiology in [facility]. 2. Right pleural effusion, R>L, recurrent: Suspect due to decompensated CHF.  This is her second thoracentesis in 3 months.  Prior effusion was transudative by light's criteria and negative for malignancy.  Status post ultrasound guided thoracentesis by IR yielding 1.2 L 11/3.  Improved. 3. Persistent A. fib: Continue diltiazem and metoprolol.  Cardiology have discontinued amiodarone.  Coumadin per pharmacy, INR subtherapeutic/1.4. 4. Essential hypertension:  Controlled. 5. Severe protein calorie malnutrition: As per family's request, added Ensure. 6. Adult failure to thrive: Multifactorial secondary to advanced age and multiple severe significant comorbidities. 7. Weight loss: Patient and family report significant weight loss over 8 to 10 months.  Outpatient follow-up. 8. Status post PPM: Cardiology plan to interrogate device. 9. Valvular heart disease (mild to moderate AI/MR, moderate TR): As per cardiology.  Awaiting echo. 10. Hypokalemia and hypomagnesemia: Replace and follow in a.m. 11. Chronic diarrhea/IBS: Unclear etiology.  Follows outpatient with [facility]. 12. Normocytic anemia: Likely anemia of chronic disease.  Periodically follow CBCs.   DVT prophylaxis: Coumadin per pharmacy.  SCDs. Code Status: DNR Family Communication: Discussed in detail with patient's daughter and a friend at bedside. Disposition: DC home pending clinical improvement.   Consultants:  IR Cardiology  Procedures:  Ultrasound-guided right thoracentesis on 11/13  Antimicrobials:  None   Subjective: Seen this morning prior to procedure.  Dyspnea better.  Chronic orthopnea and sleeps on recliner.  No chest pain reported.  Does not check her weight daily.  Leg swelling improved.  Asking for Ensure.  ROS: As above, otherwise negative.  Objective:  Vitals:   09/28/18 1954 09/29/18 0030 09/29/18 0347 09/29/18 1332  BP: (!) 144/82 (!) 145/78 126/81 130/87  Pulse: 85 79 80 93  Resp: 18 18 18 18   Temp: (!) 97.5 F (36.4 C) 97.8 F (36.6 C) 97.8 F (36.6 C) 97.8 F (36.6 C)  TempSrc: Oral Oral Oral Oral  SpO2: 93% 92% 92% 98%  Weight:   47.3 kg   Height:        Examination:  General exam: Pleasant elderly female, small built,  thinly nourished, chronically ill looking sitting up comfortably in reclining chair. Respiratory system: Reduced breath sounds in the bases, right > left.  Occasional bibasilar crackles.  Rest of lung fields clear to  auscultation.  Respiratory effort normal. Cardiovascular system: S1 and S2 heard, RRR.  JVD +.  Systolic murmur 2/6 at apex.  1+ pitting bilateral leg edema.  Telemetry personally reviewed: V paced rhythm. Gastrointestinal system: Abdomen is nondistended, soft and nontender. No organomegaly or masses felt. Normal bowel sounds heard. Central nervous system: Alert and oriented. No focal neurological deficits. Extremities: Symmetric 5 x 5 power. Skin: No rashes, lesions or ulcers Psychiatry: Judgement and insight appear normal. Mood & affect appropriate.     Data Reviewed: I have personally reviewed following labs and imaging studies  CBC: Recent Labs  Lab 09/28/18 0450 09/29/18 0406  WBC 5.9 6.7  NEUTROABS 3.3 4.0  HGB 11.7* 10.8*  HCT 37.6 34.3*  MCV 92.2 91.2  PLT 197 190   Basic Metabolic Panel: Recent Labs  Lab 09/28/18 0450 09/28/18 1430 09/29/18 0406  NA 139  --  142  K 2.7*  --  2.8*  CL 105  --  107  CO2 28  --  29  GLUCOSE 103*  --  95  BUN 13  --  13  CREATININE 0.94  --  0.85  CALCIUM 9.3  --  8.5*  MG  --  1.5* 1.5*   Liver Function Tests: No results for input(s): AST, ALT, ALKPHOS, BILITOT, PROT, ALBUMIN in the last 168 hours. Coagulation Profile: Recent Labs  Lab 09/28/18 0945 09/29/18 0406  INR 1.45 1.42   Cardiac Enzymes: Recent Labs  Lab 09/28/18 0928 09/28/18 1430 09/28/18 2138  TROPONINI <0.03 <0.03 <0.03          Radiology Studies: Dg Chest 1 View  Result Date: 09/29/2018 CLINICAL DATA:  Status post right thoracentesis. EXAM: CHEST  1 VIEW COMPARISON:  09/28/2018 and older exams FINDINGS: Since the prior exam, there has been a significant reduction in the size of the right pleural effusion. No pneumothorax. There is a small residual right pleural effusion obscuring the right hemidiaphragm. Stable left pleural effusion with associated atelectasis. Lungs show prominent bronchovascular markings similar to prior exams. Heart is  enlarged, stable. IMPRESSION: 1. Significant reduction in right pleural fluid following right-sided thoracentesis. 2. No pneumothorax or evidence of a procedure complication. 3. No other change from the prior exam. Electronically Signed   By: Amie Portland M.D.   On: 09/29/2018 13:25   Dg Chest 2 View  Result Date: 09/28/2018 CLINICAL DATA:  Acute onset of severe shortness of breath. EXAM: CHEST - 2 VIEW COMPARISON:  Chest radiograph performed 06/24/2018 FINDINGS: An enlarging moderate to large right-sided pleural effusion is noted. A small left pleural effusion is again seen. Vascular congestion is noted. Worsening bilateral airspace opacification, particularly on the right, may reflect asymmetric pulmonary edema. The cardiomediastinal silhouette is enlarged. A pacemaker is noted at the left chest wall, with leads ending at the right atrium and right ventricle. No acute osseous abnormalities are seen. IMPRESSION: 1. Enlarging moderate to large right-sided pleural effusion. Small left pleural effusion again seen. 2. Vascular congestion and cardiomegaly. Worsening bilateral airspace opacification, particularly on the right, may reflect asymmetric pulmonary edema. Electronically Signed   By: Roanna Raider M.D.   On: 09/28/2018 05:52   Ir Thoracentesis Asp Pleural Space W/img Guide  Result Date: 09/29/2018 INDICATION: On chronic systolic heart failure. History of atrial fibrillation with pacemaker, pulmonary  edema. Request for therapeutic thoracentesis. EXAM: ULTRASOUND GUIDED RIGHT THORACENTESIS MEDICATIONS: 1% lidocaine 10 mL COMPLICATIONS: None immediate. PROCEDURE: An ultrasound guided thoracentesis was thoroughly discussed with the patient and questions answered. The benefits, risks, alternatives and complications were also discussed. The patient understands and wishes to proceed with the procedure. Written consent was obtained. Ultrasound was performed to localize and mark an adequate pocket of fluid  in the right chest. The area was then prepped and draped in the normal sterile fashion. 1% Lidocaine was used for local anesthesia. Under ultrasound guidance a 6 Fr Safe-T-Centesis catheter was introduced. Thoracentesis was performed. The catheter was removed and a dressing applied. FINDINGS: A total of approximately 1.2 L of clear yellow fluid was removed. IMPRESSION: Successful ultrasound guided right thoracentesis yielding 1.2 L of pleural fluid. No pneumothorax seen on post-procedure chest x-ray. Read by: Corrin ParkerWendy Blair, PA-C Electronically Signed   By: Corlis Leak  Hassell M.D.   On: 09/29/2018 13:48        Scheduled Meds: . aspirin EC  81 mg Oral Daily  . diltiazem  180 mg Oral Daily  . feeding supplement (ENSURE ENLIVE)  237 mL Oral TID BM  . furosemide  40 mg Intravenous Q12H  . lidocaine      . loperamide  4 mg Oral Daily  . mouth rinse  15 mL Mouth Rinse BID  . metoprolol tartrate  25 mg Oral BID  . sodium chloride flush  3 mL Intravenous Q12H  . Warfarin - Pharmacist Dosing Inpatient   Does not apply q1800   Continuous Infusions: . sodium chloride 250 mL (09/29/18 1514)     LOS: 1 day     Marcellus ScottAnand Bobbijo Holst, MD, FACP, [facility] CenterFHM. [facility] Pager [phone]  If 7PM-7AM, please contact night-coverage [email or web address] Password [email or web address] 09/29/2018, 5:34 PM

## 2018-09-29 NOTE — Consult Note (Addendum)
Cardiology Consultation:   Patient ID: Carol Snyder; [id number]; October 22, 1929   Admit date: 09/28/2018 Date of Consult: 09/29/2018  Primary Care Provider: Kaleen Mask, MD Primary Cardiologist: Sandy Salaam, MD Primary Electrophysiologist:  None  Chief Complaint: shortness of breath  Patient Profile:   Carol Snyder is a 84 y.o. female with a hx of persistent atrial fibrillation on amiodarone and warfarin, prior sinus node dysfunction s/p Biotonik PPM, mild-mod AI/mild-mod MR/mod TR/mild-moderately increased PASP 05/2018, IBS, HTN, h/o GIB, reported CVA (although MRI was reportedly negative), orthostatic hypotension, chronic diastolic CHF, recurrent falls, chronic diarrhea, DNR, chronic appearing anemia who is being seen today for the evaluation of CHF at the request of Dr Ophelia Charter.  History of Present Illness:   History is obtained in part per review of Care Everywhere and her primary cardiologist's notes. More recently in 05/2018 she was admitted to [facility] with recurrent falls felt due to orthostasis and acute on chronic diarrhea, AKI, and severe malnutrition in context of chronic illness. 2D echo 05/2018 showed EF 50-55%, septal motion paradox c/w RV pacing, mild-moderate aortic insufficiency, mild-moderate mitral regurgitation, moderate tricuspid regurgitation, and mild-moderately increased PA systolic pressure . In 06/2018 she was admitted with bilateral pleural effusions, R>L, requiring thoracentesis. Fluid was transudate by Light's criteria with cytology negative for malignancy, only chronic inflammation. Patient and daughter report that periodically she will have issues with edema. They express frustration over conflicting advice from primary care (to do Lasix daily) and cardiology (to minimize to 3x/week to protect kidneys). Daughter has been giving her 20mg  daily as advised by primary care.  This last week she developed worsening orthopnea, lower extremity edema, and dyspnea to the  point where even minimal activity caused SOB. She would also get clammy and hot when walking. She did not have any CP or palpitations. Daughter indicates she's "not a complainer" so when she says she needs to go to the hospital it means she feels really bad. CXR yesterday showed enlarging moderate to large right sided pleural effusion with small left pleural effusion, worsening bilateral airspace opacification particularly on the right may reflect asymmetric pulmonary edema.  She was given 80mg  IV Lasix yesterday and additional 40mg  this morning. She underwent thoracentesis today yielding 1.2L of pleural fluid. Do not see pleural fluid was sent for analysis. BNP 828, Troponins neg x 4, K 2.7->2.8, Mg 1.5, Hgb 11.7->10.8 (similar to prior baseline), albumin 3.0. She feels much better this afternoon. She does not usually weigh daily because of vanity per daughter - does not like seeing weight go down. Follows low sodium diet and does not drink excess fluids.  She does report approximately 20lb weight loss, unintentional, over the last 6 months. She denies any asbestos or occupational exposure, citing working as a Runner, broadcasting/film/video remotely. No fevers or chills.   Past Medical History:  Diagnosis Date  . Anemia   . Aortic insufficiency   . Chest pain   . Chronic diarrhea   . DNR (do not resuscitate)   . Falling   . History of GI bleed   . History of shingles   . Long term current use of anticoagulant   . Mitral regurgitation   . Orthostatic hypotension   . Pacemaker   . Persistent atrial fibrillation    on Coumadin  . Pleural effusion   . Sinus node dysfunction (HCC)    a. s/p Biotronik PPM.  . Stroke (HCC)   . Tricuspid regurgitation     Past Surgical History:  Procedure Laterality Date  . APPENDECTOMY    . CHOLECYSTECTOMY     with partial colectomy do to gallbadder infection   . IR THORACENTESIS ASP PLEURAL SPACE W/IMG GUIDE  06/22/2018  . IR THORACENTESIS ASP PLEURAL SPACE W/IMG GUIDE   09/29/2018     Inpatient Medications: Scheduled Meds: . amiodarone  100 mg Oral Daily  . aspirin EC  81 mg Oral Daily  . diltiazem  180 mg Oral Daily  . feeding supplement (ENSURE ENLIVE)  237 mL Oral TID BM  . furosemide  40 mg Intravenous Q12H  . lidocaine      . loperamide  4 mg Oral Daily  . mouth rinse  15 mL Mouth Rinse BID  . metoprolol tartrate  25 mg Oral BID  . sodium chloride flush  3 mL Intravenous Q12H  . warfarin  5 mg Oral ONCE-1800  . Warfarin - Pharmacist Dosing Inpatient   Does not apply q1800   Continuous Infusions: . sodium chloride 250 mL (09/29/18 1514)  . magnesium sulfate 1 - 4 g bolus IVPB 2 g (09/29/18 1516)   PRN Meds: sodium chloride, acetaminophen, diphenoxylate-atropine, lidocaine (PF), ondansetron (ZOFRAN) IV, sodium chloride flush  Home Meds: Prior to Admission medications   Medication Sig Start Date End Date Taking? Authorizing Provider  acetaminophen (TYLENOL) 325 MG tablet Take 650 mg by mouth every 4 (four) hours as needed for mild pain or fever (or if temp is 99.0 or greater for 72 hours.).   Yes [provider]  alendronate (FOSAMAX) 70 MG tablet Take 70 mg by mouth once a week. Take with a full glass of water on an empty stomach.   Yes [provider]  amiodarone (PACERONE) 100 MG tablet Take 100 mg by mouth daily.   Yes [provider]  diltiazem (TIAZAC) 180 MG 24 hr capsule Take 180 mg by mouth daily.   Yes [provider]  diphenoxylate-atropine (LOMOTIL) 2.5-0.025 MG tablet Take 1 tablet by mouth 4 (four) times daily as needed for diarrhea or loose stools. Patient taking differently: Take 2 tablets by mouth 4 (four) times daily as needed for diarrhea or loose stools.  06/09/18  Yes Tyrone Nine, MD  doxycycline (ADOXA) 100 MG tablet Take 50 mg by mouth daily.   Yes [provider]  furosemide (LASIX) 20 MG tablet Take 1-2 tablets (20-40 mg total) by mouth daily. Take 40mg  daily alternating with  20mg  daily Patient taking differently: Take 40 mg by mouth daily.  06/24/18  Yes Zannie Cove, MD  hydroxypropyl methylcellulose / hypromellose (ISOPTO TEARS / GONIOVISC) 2.5 % ophthalmic solution Place 1 drop into both eyes 3 (three) times daily as needed for dry eyes.   Yes [provider]  loperamide (IMODIUM) 2 MG capsule Take 4 mg by mouth daily.   Yes [provider]  Menthol, Topical Analgesic, (BIOFREEZE EX) Apply 1 application topically daily as needed (pain).   Yes [provider]  metoprolol tartrate (LOPRESSOR) 50 MG tablet Take 25 mg by mouth 2 (two) times daily.    Yes [provider]  warfarin (COUMADIN) 2.5 MG tablet Take 2.5 mg by mouth See admin instructions. Take 1 table for two days then skip a day and repeat.   Yes [provider]    Allergies:    Allergies  Allergen Reactions  . Codeine Other (See Comments)    unknown  . Enbrel [Etanercept] Other (See Comments)    unknown  . Fosamax [Alendronate Sodium] Other (  See Comments)    unknown  . Hydromorphone Other (See Comments)    unknown  . Motrin [Ibuprofen] Other (See Comments)    Due to blood thinner   . Sulfa Antibiotics Other (See Comments)    Childhood allergy   . Voltaren [Diclofenac] Other (See Comments)    unknown  . Naproxen Sodium Palpitations    Social History:   Social History   Socioeconomic History  . Marital status: Single    Spouse name: Not on file  . Number of children: Not on file  . Years of education: Not on file  . Highest education level: Not on file  Occupational History  . Not on file  Social Needs  . Financial resource strain: Not on file  . Food insecurity:    Worry: Not on file    Inability: Not on file  . Transportation needs:    Medical: Not on file    Non-medical: Not on file  Tobacco Use  . Smoking status: Former Smoker    Last attempt to quit: 11/17/2004    Years since quitting: 13.8  . Smokeless tobacco: Never Used    Substance and Sexual Activity  . Alcohol use: No  . Drug use: No  . Sexual activity: Not on file  Lifestyle  . Physical activity:    Days per week: Not on file    Minutes per session: Not on file  . Stress: Not on file  Relationships  . Social connections:    Talks on phone: Not on file    Gets together: Not on file    Attends religious service: Not on file    Active member of club or organization: Not on file    Attends meetings of clubs or organizations: Not on file    Relationship status: Not on file  . Intimate partner violence:    Fear of current or ex partner: Not on file    Emotionally abused: Not on file    Physically abused: Not on file    Forced sexual activity: Not on file  Other Topics Concern  . Not on file  Social History Narrative  . Not on file    Family History:   The patient's family history includes Arrhythmia in her daughter; Cancer in her brother; Heart attack in her father; Hypertension in her father; Stroke in her mother.  ROS:  Please see the history of present illness.  All other ROS reviewed and negative.     Physical Exam/Data:   Vitals:   09/28/18 1954 09/29/18 0030 09/29/18 0347 09/29/18 1332  BP: (!) 144/82 (!) 145/78 126/81 130/87  Pulse: 85 79 80 93  Resp: 18 18 18 18   Temp: (!) 97.5 F (36.4 C) 97.8 F (36.6 C) 97.8 F (36.6 C) 97.8 F (36.6 C)  TempSrc: Oral Oral Oral Oral  SpO2: 93% 92% 92% 98%  Weight:   47.3 kg   Height:        Intake/Output Summary (Last 24 hours) at 09/29/2018 1526 Last data filed at 09/29/2018 1411 Gross per 24 hour  Intake 1340 ml  Output 600 ml  Net 740 ml   Filed Weights   09/28/18 0926 09/28/18 1256 09/29/18 0347  Weight: 48.4 kg 48.4 kg 47.3 kg   Body mass index is 16.32 kg/m.  General: Frail elderly WF in no acute distress. Head: Normocephalic, atraumatic, sclera non-icteric, no xanthomas, nares are without discharge. Neck: Negative for carotid bruits. JVD not elevated. Lungs:  Decreased BS L  base, clear R lung. No wheezes, rales, or rhonchi. Breathing is unlabored. Heart: RRR with S1 S2 with intermittent irregularity. No murmurs, rubs, or gallops appreciated. Blowing murmur at apex Abdomen: Soft, non-tender, non-distended with normoactive bowel sounds. No hepatomegaly. No rebound/guarding. No obvious abdominal masses. Msk:  Strength and tone appear normal for age. Extremities: No clubbing or cyanosis. Minimal edema with striations indicating prior significant fluid retention. Distal pedal pulses are 2+ and equal bilaterally. Neuro: Alert and oriented X 3. No facial asymmetry. No focal deficit. Moves all extremities spontaneously. Psych:  Responds to questions appropriately with a normal affect.  EKG:  The EKG was personally reviewed and demonstrates what appears to be atrial fib vs flutter with demand PPM, new TWI V3-V6 although prior tracings in these leads were V paced. She previously had inferior TWI when not paced, but was paced in these leads on EKG this admission.   Laboratory Data:  Chemistry Recent Labs  Lab 09/28/18 0450 09/29/18 0406  NA 139 142  K 2.7* 2.8*  CL 105 107  CO2 28 29  GLUCOSE 103* 95  BUN 13 13  CREATININE 0.94 0.85  CALCIUM 9.3 8.5*  GFRNONAA 53* 60*  GFRAA >60 >60  ANIONGAP 6 6    No results for input(s): PROT, ALBUMIN, AST, ALT, ALKPHOS, BILITOT in the last 168 hours. Hematology Recent Labs  Lab 09/28/18 0450 09/29/18 0406  WBC 5.9 6.7  RBC 4.08 3.76*  HGB 11.7* 10.8*  HCT 37.6 34.3*  MCV 92.2 91.2  MCH 28.7 28.7  MCHC 31.1 31.5  RDW 14.5 14.4  PLT 197 190   Cardiac Enzymes Recent Labs  Lab 09/28/18 0928 09/28/18 1430 09/28/18 2138  TROPONINI <0.03 <0.03 <0.03    Recent Labs  Lab 09/28/18 0510  TROPIPOC 0.00    BNP Recent Labs  Lab 09/28/18 0450  BNP 828.6*    DDimer No results for input(s): DDIMER in the last 168 hours.  Radiology/Studies:  Dg Chest 1 View  Result Date: 09/29/2018 CLINICAL  DATA:  Status post right thoracentesis. EXAM: CHEST  1 VIEW COMPARISON:  09/28/2018 and older exams FINDINGS: Since the prior exam, there has been a significant reduction in the size of the right pleural effusion. No pneumothorax. There is a small residual right pleural effusion obscuring the right hemidiaphragm. Stable left pleural effusion with associated atelectasis. Lungs show prominent bronchovascular markings similar to prior exams. Heart is enlarged, stable. IMPRESSION: 1. Significant reduction in right pleural fluid following right-sided thoracentesis. 2. No pneumothorax or evidence of a procedure complication. 3. No other change from the prior exam. Electronically Signed   By: Amie Portland M.D.   On: 09/29/2018 13:25   Dg Chest 2 View  Result Date: 09/28/2018 CLINICAL DATA:  Acute onset of severe shortness of breath. EXAM: CHEST - 2 VIEW COMPARISON:  Chest radiograph performed 06/24/2018 FINDINGS: An enlarging moderate to large right-sided pleural effusion is noted. A small left pleural effusion is again seen. Vascular congestion is noted. Worsening bilateral airspace opacification, particularly on the right, may reflect asymmetric pulmonary edema. The cardiomediastinal silhouette is enlarged. A pacemaker is noted at the left chest wall, with leads ending at the right atrium and right ventricle. No acute osseous abnormalities are seen. IMPRESSION: 1. Enlarging moderate to large right-sided pleural effusion. Small left pleural effusion again seen. 2. Vascular congestion and cardiomegaly. Worsening bilateral airspace opacification, particularly on the right, may reflect asymmetric pulmonary edema. Electronically Signed   By: Roanna Raider M.D.   On:  09/28/2018 05:52   Ir Thoracentesis Asp Pleural Space W/img Guide  Result Date: 09/29/2018 INDICATION: On chronic systolic heart failure. History of atrial fibrillation with pacemaker, pulmonary edema. Request for therapeutic thoracentesis. EXAM:  ULTRASOUND GUIDED RIGHT THORACENTESIS MEDICATIONS: 1% lidocaine 10 mL COMPLICATIONS: None immediate. PROCEDURE: An ultrasound guided thoracentesis was thoroughly discussed with the patient and questions answered. The benefits, risks, alternatives and complications were also discussed. The patient understands and wishes to proceed with the procedure. Written consent was obtained. Ultrasound was performed to localize and  an adequate pocket of fluid in the right chest. The area was then prepped and draped in the normal sterile fashion. 1% Lidocaine was used for local anesthesia. Under ultrasound guidance a 6 Fr Safe-T-Centesis catheter was introduced. Thoracentesis was performed. The catheter was removed and a dressing applied. FINDINGS: A total of approximately 1.2 L of clear yellow fluid was removed. IMPRESSION: Successful ultrasound guided right thoracentesis yielding 1.2 L of pleural fluid. No pneumothorax seen on post-procedure chest x-ray. Read by: Corrin Parker, PA-C Electronically Signed   By: Corlis Leak M.D.   On: 09/29/2018 13:48    Assessment and Plan:   1. Recurrent bilateral pleural effusions, R>L, likely related to acute on chronic diastolic CHF - she is now s/p her 2nd thoracentesis in 3 months. Do not see that pleural fluid was sent for testing. Last tap's path showed no evidence of malignancy. She has had weight loss over the last 6 months. Await echo. Will review diuretic regimen with MD. Suspect she needs higher regimen at baseline at home as well. Hypoalbuminemia is also likely to be contributing.   2. Valvular heart disease with mild-mod AI/MR, mod TR, elevated PASP - await echo. She has a blowing murmur at apex, question whether MR has progressed.  3. Persistent atrial fibrillation with h/o sinus node dysfunction s/p Biotronik PPM - she is on amiodarone chronically. It is difficult to tell from recent office notes if she was in NSR at that time, just listed as controlled. Notes  indicate amiodarone was not previously increased due to GI complaints. Will interrogate device. She is on Coumadin per primary cardiologist. CHADSVASC is 7 for CHF, HTN, age, stroke, female - will clarify with MD whether she should be bridged with heparin while INR is subtherapeutic given that she appears to be out of rhythm. Hgb is abnormal in the 10 range but c/w prior baseline.  4. Severe hypokalemia/hypomagnesemia - daughter indicates etiology of chronic diarrhea not totally understood, followed by [facility]. Internal medicine is repleting lytes.    For questions or updates, please contact [facility] Please consult [email or web address] for contact info under Cardiology/STEMI.    Signed, Laurann Montana, PA-C  09/29/2018 3:26 PM   Personally seen and examined. Agree with above.  84 year old with BMI of 16, protein calorie malnutrition with persistent atrial fibrillation, recurrent pleural effusion, significant lower extremity edema, acute on chronic diastolic heart failure, failure to thrive.  Currently feels much better from a breathing perspective after thoracentesis of approximately 1.5 L.  Lower extremity edema is edly improved after IV Lasix.  No chest pain, no syncope.  On physical exam- thin, elderly, smiling, in no acute distress.  Daughter at bedside.  Lungs blunted basilar breath sounds right, no significant JVD, heart regular rate and rhythm with occasional ectopy, left chest wall pacemaker noted, lower extremity edema edly improved, now 1+ bilateral, no rashes.  Lab work personally reviewed-creatinine 0.85 however this leads to a creatinine clearance of 34.8 mL/min  given her weight and age.  Potassium markedly reduced at 2.8.  Assessment and plan:  Acute on chronic diastolic heart failure with recurrent pleural effusion - At home she was taking 20 mg of Lasix once a day.  At discharge, she will likely need 40 mg of Lasix once a day. -Daily weights.  She is hesitant to do  this because she is discouraged by her chronic weight loss, failure to thrive. - Replete potassium-hypokalemia - We will go ahead and give her another dose of IV Lasix this evening.  Likely transition to p.o. Tomorrow.  Permanent atrial fibrillation - My hunch is that she is on low-dose amiodarone 100 mg for rate control purposes only.  She is also on both diltiazem as well as metoprolol at home.  I think we should stop her amiodarone and utilize the AV nodal blocking agents for continued rate control.  Chronic anticoagulation -Currently on warfarin.  Subtherapeutic.  Changes in dosage made by pharmacy.  Failure to thrive, severe protein calorie malnutrition - Continue to encourage protein.  Donato Schultz, MD

## 2018-09-30 DIAGNOSIS — I482 Chronic atrial fibrillation, unspecified: Secondary | ICD-10-CM

## 2018-09-30 LAB — BASIC METABOLIC PANEL
ANION GAP: 8 (ref 5–15)
BUN: 14 mg/dL (ref 8–23)
CALCIUM: 8.6 mg/dL — AB (ref 8.9–10.3)
CO2: 30 mmol/L (ref 22–32)
Chloride: 101 mmol/L (ref 98–111)
Creatinine, Ser: 0.88 mg/dL (ref 0.44–1.00)
GFR, EST NON AFRICAN AMERICAN: 57 mL/min — AB (ref >60)
Glucose, Bld: 115 mg/dL — ABNORMAL HIGH (ref 70–99)
POTASSIUM: 3.8 mmol/L (ref 3.5–5.1)
SODIUM: 139 mmol/L (ref 135–145)

## 2018-09-30 LAB — PROTIME-INR
INR: 1.54
PROTHROMBIN TIME: 18.3 s — AB (ref 11.4–15.2)

## 2018-09-30 LAB — MAGNESIUM: MAGNESIUM: 1.9 mg/dL (ref 1.7–2.4)

## 2018-09-30 MED ORDER — WARFARIN SODIUM 5 MG PO TABS
5.0000 mg | ORAL_TABLET | Freq: Once | ORAL | Status: AC
  Administered 2018-09-30: 5 mg via ORAL
  Filled 2018-09-30: qty 1

## 2018-09-30 NOTE — Progress Notes (Addendum)
Initial Nutrition Assessment  DOCUMENTATION CODES:   Severe malnutrition in context of chronic illness, Underweight  INTERVENTION:   Magic Cup with meals (prefers chocolate and orange cream) Snacks between meals- provided information about ordering Continue Ensure Enlive BID Discussed high calorie and high protein needs with pt and daughter and gave suggestions. Discussed importance of adequate nutrition to prevent weight loss.    NUTRITION DIAGNOSIS:   Severe Malnutrition related to diarrhea as evidenced by severe muscle depletion, moderate fat depletion, percent weight loss 11.5% over past 4 months.   GOAL:   Patient will meet greater than or equal to 90% of their needs  MONITOR:   PO intake, Supplement acceptance  REASON FOR ASSESSMENT:   Consult Assessment of nutrition requirement/status  ASSESSMENT:   Pt with a PMH of AFIB, HF, CVA and a pacemaker. Pt admitted with shortness of breath due to acute on chronic CHF.  Pt was alert and oriented when we entered the room. Her daughter was at bedside. Pt lives at home with friends and neighbors  coming in the morning around [place] and then family members coming at dinner time to spend the night with her. She had been living by herself until about 3 months ago.  We discussed with pt recent weight loss. Per the chart, we noticed a 11.5% weight loss over the past 4 months which was confirmed by the pt and daughter that she had been gradually losing weight about 20 pounds over the past year. Pt reports that her UBW one year ago was around 125#. Pt reports that weight loss is due to diarrhea which has been ongoing for the past year. Pt has been [facility], per pt no know cause for diarrhea. Pt reports diarrhea is persists no matter what she eats.   Pt has very inconsistent eating times at home with a varied scheduled.Pr reports that she normally has a good appetite for foods she likes to consume. Per pt,  daughter, and chart confirmation, pt at 100% of her breakfast.   Her normal breakfast intake at home includes eggs (which she doesn't love) with cheese or cereal with almond milk. Pt usually consumes almond milk or soy milk instead of regular milk due to taste and occasionally not agreeing with her stomach, leading to her diarrhea. For a snack, pt nibbles on some chocolate candy, fruit for a midmorning snack. For lunch pt may eat a cheeseburger at [employer], [employer] chicken salad (with added fruits and nuts from home), pimento cheese or tomato sandwich. In the afternoon, she sometimes consumes a snack similar to her midmorning snack. For dinner the pt usually warms up a meal made previously by a family member.   Medications: Lasix 40mg /12 hours  Labs: Glucose 115 (H) 11/14 BNP 828.6 (H) 11/12  NUTRITION - FOCUSED PHYSICAL EXAM:    Most Recent Value  Orbital Region  No depletion  Upper Arm Region  Severe depletion  Thoracic and Lumbar Region  Moderate depletion  Buccal Region  Moderate depletion  Temple Region  Moderate depletion  Clavicle Bone Region  Severe depletion  Clavicle and Acromion Bone Region  Moderate depletion  Scapular Bone Region  Moderate depletion  Dorsal Hand  Severe depletion  Patellar Region  Moderate depletion  Anterior Thigh Region  Severe depletion  Posterior Calf Region  Severe depletion  Edema (RD Assessment)  None  Hair  Reviewed  Eyes  Reviewed  Mouth  Reviewed  Skin  Reviewed [ecchymosis]  Nails  Reviewed       Diet Order:   Diet Order            Diet Heart Room service appropriate? Yes; Fluid consistency: Thin; Fluid restriction: 1500 mL Fluid  Diet effective now              EDUCATION NEEDS:   Education needs have been addressed  Skin:  Skin Assessment: Reviewed RN Assessment  Last BM:  11/12  Height:   Ht Readings from Last 1 Encounters:  09/28/18 5\' 7"  (1.702 m)    Weight:   Wt Readings from Last 1 Encounters:  09/30/18 46.9  kg    Ideal Body Weight:  61.3 kg  BMI:  Body mass index is 16.21 kg/m.  Estimated Nutritional Needs:   Kcal:  1300-1500   Protein:  60-75 grams  Fluid:  1.5 L   Bethena Midget, MS, Dietetic Intern Pager: [phone] After hours Pager: [phone]

## 2018-09-30 NOTE — Progress Notes (Signed)
ANTICOAGULATION CONSULT NOTE  Pharmacy Consult for warfarin Indication: atrial fibrillation and hx stroke  Allergies  Allergen Reactions  . Codeine Other (See Comments)    unknown  . Enbrel [Etanercept] Other (See Comments)    unknown  . Fosamax [Alendronate Sodium] Other (See Comments)    unknown  . Hydromorphone Other (See Comments)    unknown  . Motrin [Ibuprofen] Other (See Comments)    Due to blood thinner   . Sulfa Antibiotics Other (See Comments)    Childhood allergy   . Voltaren [Diclofenac] Other (See Comments)    unknown  . Naproxen Sodium Palpitations    Patient Measurements: Height: 5\' 7"  (170.2 cm) Weight: 103 lb 8 oz (46.9 kg)(scale a) IBW/kg (Calculated) : 61.6  Vital Signs: Temp: 97.7 F (36.5 C) (11/14 0443) Temp Source: Oral (11/14 0443) BP: 134/65 (11/14 0443) Pulse Rate: 62 (11/14 0443)  Labs: Recent Labs    09/28/18 0450 09/28/18 0928 09/28/18 0945 09/28/18 1430 09/28/18 2138 09/29/18 0406 09/30/18 0311  HGB 11.7*  --   --   --   --  10.8*  --   HCT 37.6  --   --   --   --  34.3*  --   PLT 197  --   --   --   --  190  --   LABPROT  --   --  17.5*  --   --  17.2* 18.3*  INR  --   --  1.45  --   --  1.42 1.54  CREATININE 0.94  --   --   --   --  0.85 0.88  TROPONINI  --  <0.03  --  <0.03 <0.03  --   --     Estimated Creatinine Clearance: 33.3 mL/min (by C-G formula based on SCr of 0.88 mg/dL).   Assessment: [age] YOF with hx of afib on warfarin PTA.PTA amiodarone discontinued on 11/13. Anticipate further INR rise in am.  -INR= 1.54   PTA dosing: 2.5mg  two days in a row, then no dose on third day, repeated as cycle  Goal of Therapy:  INR 2-3 Monitor platelets by anticoagulation protocol: Yes   Plan:  Warfarin 5 mg PO tonight Daily INR  Harland GermanAndrew Denee Boeder, PharmD Clinical Pharmacist **Pharmacist phone directory can now be found on [email or web address] ([email or web address]).  Listed under [facility].

## 2018-09-30 NOTE — Progress Notes (Signed)
Physical Therapy Treatment Patient Details Name: Carol Snyder MRN: [id number] DOB: 11/24/1928 Today's Date: 09/30/2018    History of Present Illness 84 yo female admitted from home presenting with SOB. Dx CHF. PMhx: Afib, CVA, pacemaker. Thoracentesis on 11/13.    PT Comments    Pt is progressing towards goals. Presents with decreased muscular strength and endurance demonstrated by fatigue and decrease SpO2 over 176ft walk. Pt SpO2 ranged from mid 80s-90% on RA during session. Provided cues for breathing techniques to increase ventilation with amb. Pt educated on importance of weaning off supplemental O2 if she will be returning home without it. Left on RA post session, nursing made aware. Skilled intervention is warranted to improve muscular strength and endurance for independent, safe amb when patient returns home. Will follow acutely.    Follow Up Recommendations  Home health PT;Supervision for mobility/OOB     Equipment Recommendations  3in1 (PT)(patient needs an oval, regular sized BSC to fit small frame)    Recommendations for Other Services       Precautions / Restrictions Precautions Precautions: Fall Restrictions Weight Bearing Restrictions: No    Mobility  Bed Mobility               General bed mobility comments: patient presented in chair and returned to chair, not assessed.   Transfers Overall transfer level: Needs assistance Equipment used: Rolling walker (2 wheeled) Transfers: Sit to/from Stand Sit to Stand: Min guard         General transfer comment: verbal cues to stand in place before amb to assess SpO2 on RA  Ambulation/Gait Ambulation/Gait assistance: Min guard Gait Distance (Feet): 100 Feet Assistive device: Rolling walker (2 wheeled) Gait Pattern/deviations: Trunk flexed;Decreased stride length;Step-through pattern   Gait velocity interpretation: 1.31 - 2.62 ft/sec, indicative of limited community ambulator General Gait Details: No SOB.  Needed cues to slow down, patient was walking faster than normal speed to catch up to therapist with IV pole. Verbal cues to keep walker close. At 40ft of amb SpO2 at 88% for 10 sec, returned above 90% with standing break and cues to take deep breaths through nose. returned to 90-93% for rest of amb with one other standing break (<15 sec).    Stairs             Wheelchair Mobility    Modified Rankin (Stroke Patients Only)       Balance Overall balance assessment: Needs assistance   Sitting balance-Leahy Scale: Good Sitting balance - Comments: sitting took socks off, donned shoes. No LOB with reaching outside of BOS.    Standing balance support: Bilateral upper extremity supported Standing balance-Leahy Scale: Poor Standing balance comment: needs UE support from RW, good balance with RW                            Cognition Arousal/Alertness: Awake/alert Behavior During Therapy: WFL for tasks assessed/performed Overall Cognitive Status: Within Functional Limits for tasks assessed                                 General Comments: HOH      Exercises      General Comments        Pertinent Vitals/Pain Pain Assessment: No/denies pain    Home Living  Prior Function            PT Goals (current goals can now be found in the care plan section) Acute Rehab PT Goals Patient Stated Goal: return home PT Goal Formulation: With patient/family Time For Goal Achievement: 10/12/18 Potential to Achieve Goals: Good Progress towards PT goals: Progressing toward goals    Frequency    Min 3X/week      PT Plan Current plan remains appropriate    Co-evaluation              AM-PAC PT "6 Clicks" Daily Activity  Outcome Measure  Difficulty turning over in bed (including adjusting bedclothes, sheets and blankets)?: A Little Difficulty moving from lying on back to sitting on the side of the bed? : A  Little Difficulty sitting down on and standing up from a chair with arms (e.g., wheelchair, bedside commode, etc,.)?: A Little Help needed moving to and from a bed to chair (including a wheelchair)?: A Little Help needed walking in hospital room?: A Little Help needed climbing 3-5 steps with a railing? : A Lot 6 Click Score: 17    End of Session Equipment Utilized During Treatment: Gait belt;Oxygen Activity Tolerance: Patient tolerated treatment well;No increased pain Patient left: in chair;with chair alarm set;with family/visitor present;with call bell/phone within reach Nurse Communication: Mobility status;Other (comment)(patient left on RA, to be monitored) PT Visit Diagnosis: Other abnormalities of gait and mobility (R26.89);Muscle weakness (generalized) (M62.81)     Time: [id number] PT Time Calculation (min) (ACUTE ONLY): 26 min  Charges:  $Gait Training: 8-22 mins $Therapeutic Activity: 8-22 mins                     Pattricia BossAnne Maddox Hlavaty, SPT Acute Rehab Services [phone]   Pattricia Bossnne Samya Siciliano 09/30/2018, 4:39 PM

## 2018-09-30 NOTE — Plan of Care (Signed)
  Problem: Activity: Goal: Risk for activity intolerance will decrease 09/30/2018 2009 by Carolanne GrumblingWall, Bernardette Waldron C, RN Outcome: Progressing 09/30/2018 0743 by Carolanne GrumblingWall, Taraya Steward C, RN Outcome: Progressing   Problem: Elimination: Goal: Will not experience complications related to bowel motility 09/30/2018 2009 by Carolanne GrumblingWall, Kella Splinter C, RN Outcome: Progressing 09/30/2018 0743 by Carolanne GrumblingWall, Shereece Wellborn C, RN Outcome: Progressing   Problem: Pain Managment: Goal: General experience of comfort will improve Outcome: Progressing   Problem: Safety: Goal: Ability to remain free from injury will improve 09/30/2018 2009 by Carolanne GrumblingWall, Veroncia Jezek C, RN Outcome: Progressing 09/30/2018 0743 by Carolanne GrumblingWall, Airabella Barley C, RN Outcome: Progressing

## 2018-09-30 NOTE — Progress Notes (Signed)
PROGRESS NOTE   SPRUHA WEIGHT  GNF:[id number]    DOB: May 11, 1929    DOA: 09/28/2018  PCP: Kaleen Mask, MD   I have briefly reviewed patients previous medical records in [facility].  Brief Narrative:  84 year old female with PMH of persistent A. fib on warfarin, SSS status post PPM, IBS, HTN, GI bleed, CVA, orthostatic hypotension, chronic diastolic CHF, frequent falls, chronic diarrhea, hospitalized in July 2019 for falls due to orthostasis, acute on chronic diarrhea, acute kidney injury and then in August 2019 for bilateral pleural effusions requiring thoracentesis, presented with worsening dyspnea, orthopnea and lower extremity edema without chest pain.  Admitted for acute on chronic diastolic CHF with recurrent pleural effusion.  IR and cardiology consulted.  Status post right thoracentesis 11/13.  09/30/2018: Patient seen alongside patient's daughter.  Patient continues to improve.  Patient is status post right thoracentesis.  Cachexia persists.  Dietitian/nutrition input is appreciated.  Will consult physical therapy.  Assessment & Plan:   Principal Problem:   Acute on chronic systolic CHF (congestive heart failure) (HCC) Active Problems:   Atrial fibrillation, chronic   Chronic anticoagulation   Pleural effusion   Hypokalemia   Acute on chronic diastolic CHF: As per family, patient does not check weight daily, compliant with low-salt diet, on Lasix 20 mg daily (advised by cardiology to take 20 mg 3 times a week and by PCP 20 mg daily).  Cardiology consulted 09/30/2018: IV Lasix will be continued for today according to cardiology team.  Possible transition to oral diuretics in a.m.    Right pleural effusion, R>L, recurrent: Suspect due to decompensated CHF.  This is her second thoracentesis in 3 months.  Prior effusion was transudative by light's criteria and negative for malignancy.  Status post ultrasound guided thoracentesis by IR yielding 1.2 L 11/3.   Improved.  Persistent A. fib: Continue diltiazem and metoprolol.  Cardiology have discontinued amiodarone.  Coumadin per pharmacy, INR subtherapeutic 1.4.  Essential hypertension:  Controlled. Continue to optimize.  Severe protein calorie malnutrition/weight loss:  Significant weight loss reported. Consult dietitian Caloric count.  Adult failure to thrive:  Multifactorial secondary to advanced age and multiple severe significant comorbidities. 09/30/2018: Guarded prognosis.  Status post PPM: Cardiology plan to interrogate device.  Valvular heart disease (mild to moderate AI/MR, moderate TR): As per cardiology.  Awaiting echo.  Hypokalemia and hypomagnesemia: Replace and follow in a.m.  Chronic diarrhea/IBS: Unclear etiology.  Follows outpatient with [facility].  May be contributing to malnutrition.  Normocytic anemia: Likely anemia of chronic disease.  Periodically follow CBCs.   DVT prophylaxis: Coumadin per pharmacy.  SCDs. Code Status: DNR Family Communication: Discussed in detail with patient's daughter and a friend at bedside. Disposition: DC home pending clinical improvement.   Consultants:  IR Cardiology  Procedures:  Ultrasound-guided right thoracentesis on 11/13  Antimicrobials:  None   Subjective: Seen alongside patient's daughter.   Slowly improving.   Shortness of breath is improving  No chest pain.   Objective:  Vitals:   09/30/18 1000 09/30/18 1200 09/30/18 1516 09/30/18 1555  BP: 120/76 115/65 104/67   Pulse: 64 69 71   Resp: 18 20 17    Temp: 97.6 F (36.4 C) 97.7 F (36.5 C) (!) 97.4 F (36.3 C)   TempSrc: Oral Oral Oral   SpO2: 100% 100% 96% 99%  Weight:      Height:        Examination:  General exam: Cachectic.  Chronically ill looking. Respiratory system: Decreased  air entry globally.   Cardiovascular system: S1 and S2 heard Gastrointestinal system: Abdomen is nondistended, soft and nontender. No organomegaly or masses felt.  Normal bowel sounds heard. Central nervous system: Alert and oriented. No focal neurological deficits. Extremities: Chronic leg edema seems to have improved significantly with diuretics.     Data Reviewed: I have personally reviewed following labs and imaging studies  CBC: Recent Labs  Lab 09/28/18 0450 09/29/18 0406  WBC 5.9 6.7  NEUTROABS 3.3 4.0  HGB 11.7* 10.8*  HCT 37.6 34.3*  MCV 92.2 91.2  PLT 197 190   Basic Metabolic Panel: Recent Labs  Lab 09/28/18 0450 09/28/18 1430 09/29/18 0406 09/30/18 0311  NA 139  --  142 139  K 2.7*  --  2.8* 3.8  CL 105  --  107 101  CO2 28  --  29 30  GLUCOSE 103*  --  95 115*  BUN 13  --  13 14  CREATININE 0.94  --  0.85 0.88  CALCIUM 9.3  --  8.5* 8.6*  MG  --  1.5* 1.5* 1.9   Liver Function Tests: No results for input(s): AST, ALT, ALKPHOS, BILITOT, PROT, ALBUMIN in the last 168 hours. Coagulation Profile: Recent Labs  Lab 09/28/18 0945 09/29/18 0406 09/30/18 0311  INR 1.45 1.42 1.54   Cardiac Enzymes: Recent Labs  Lab 09/28/18 0928 09/28/18 1430 09/28/18 2138  TROPONINI <0.03 <0.03 <0.03          Radiology Studies: Dg Chest 1 View  Result Date: 09/29/2018 CLINICAL DATA:  Status post right thoracentesis. EXAM: CHEST  1 VIEW COMPARISON:  09/28/2018 and older exams FINDINGS: Since the prior exam, there has been a significant reduction in the size of the right pleural effusion. No pneumothorax. There is a small residual right pleural effusion obscuring the right hemidiaphragm. Stable left pleural effusion with associated atelectasis. Lungs show prominent bronchovascular markings similar to prior exams. Heart is enlarged, stable. IMPRESSION: 1. Significant reduction in right pleural fluid following right-sided thoracentesis. 2. No pneumothorax or evidence of a procedure complication. 3. No other change from the prior exam. Electronically Signed   By: Amie Portlandavid  Ormond M.D.   On: 09/29/2018 13:25   Ir Thoracentesis Asp  Pleural Space W/img Guide  Result Date: 09/29/2018 INDICATION: On chronic systolic heart failure. History of atrial fibrillation with pacemaker, pulmonary edema. Request for therapeutic thoracentesis. EXAM: ULTRASOUND GUIDED RIGHT THORACENTESIS MEDICATIONS: 1% lidocaine 10 mL COMPLICATIONS: None immediate. PROCEDURE: An ultrasound guided thoracentesis was thoroughly discussed with the patient and questions answered. The benefits, risks, alternatives and complications were also discussed. The patient understands and wishes to proceed with the procedure. Written consent was obtained. Ultrasound was performed to localize and mark an adequate pocket of fluid in the right chest. The area was then prepped and draped in the normal sterile fashion. 1% Lidocaine was used for local anesthesia. Under ultrasound guidance a 6 Fr Safe-T-Centesis catheter was introduced. Thoracentesis was performed. The catheter was removed and a dressing applied. FINDINGS: A total of approximately 1.2 L of clear yellow fluid was removed. IMPRESSION: Successful ultrasound guided right thoracentesis yielding 1.2 L of pleural fluid. No pneumothorax seen on post-procedure chest x-ray. Read by: Corrin ParkerWendy Blair, PA-C Electronically Signed   By: Corlis Leak  Hassell M.D.   On: 09/29/2018 13:48        Scheduled Meds: . aspirin EC  81 mg Oral Daily  . diltiazem  180 mg Oral Daily  . feeding supplement (ENSURE ENLIVE)  237 mL Oral  TID BM  . furosemide  40 mg Intravenous Q12H  . loperamide  4 mg Oral Daily  . mouth rinse  15 mL Mouth Rinse BID  . metoprolol tartrate  25 mg Oral BID  . sodium chloride flush  3 mL Intravenous Q12H  . warfarin  5 mg Oral ONCE-1800  . Warfarin - Pharmacist Dosing Inpatient   Does not apply q1800   Continuous Infusions: . sodium chloride 250 mL (09/29/18 1514)     LOS: 2 days     Barnetta Chapel, MD. [facility] Pager [phone]  If 7PM-7AM, please contact night-coverage [email or web address] Password  [email or web address] 09/30/2018, 4:11 PM

## 2018-09-30 NOTE — Plan of Care (Signed)
  Problem: Activity: Goal: Risk for activity intolerance will decrease Outcome: Progressing   Problem: Elimination: Goal: Will not experience complications related to bowel motility Outcome: Progressing   Problem: Pain Managment: Goal: General experience of comfort will improve Outcome: Progressing   Problem: Safety: Goal: Ability to remain free from injury will improve Outcome: Progressing   

## 2018-09-30 NOTE — Progress Notes (Signed)
Progress Note  Patient Name: Carol Snyder Date of Encounter: 09/30/2018  Primary Cardiologist: Sandy Salaam, MD   Subjective   Breathing a little bit better.  No chest pain.  Thoracentesis yesterday approximately 1.5 L  Inpatient Medications    Scheduled Meds: . aspirin EC  81 mg Oral Daily  . diltiazem  180 mg Oral Daily  . feeding supplement (ENSURE ENLIVE)  237 mL Oral TID BM  . furosemide  40 mg Intravenous Q12H  . loperamide  4 mg Oral Daily  . mouth rinse  15 mL Mouth Rinse BID  . metoprolol tartrate  25 mg Oral BID  . sodium chloride flush  3 mL Intravenous Q12H  . warfarin  5 mg Oral ONCE-1800  . Warfarin - Pharmacist Dosing Inpatient   Does not apply q1800   Continuous Infusions: . sodium chloride 250 mL (09/29/18 1514)   PRN Meds: sodium chloride, acetaminophen, diphenoxylate-atropine, lidocaine (PF), ondansetron (ZOFRAN) IV, sodium chloride flush   Vital Signs    Vitals:   09/29/18 1332 09/29/18 1948 09/30/18 0024 09/30/18 0443  BP: 130/87 128/64 131/69 134/65  Pulse: 93 75 63 62  Resp: 18 18 18 18   Temp: 97.8 F (36.6 C) 98.1 F (36.7 C) 98.4 F (36.9 C) 97.7 F (36.5 C)  TempSrc: Oral Oral Oral Oral  SpO2: 98% 95% 94% 97%  Weight:    46.9 kg  Height:        Intake/Output Summary (Last 24 hours) at 09/30/2018 1006 Last data filed at 09/30/2018 1000 Gross per 24 hour  Intake 1380 ml  Output 300 ml  Net 1080 ml   Filed Weights   09/28/18 1256 09/29/18 0347 09/30/18 0443  Weight: 48.4 kg 47.3 kg 46.9 kg    Telemetry    Ventricular pacing, atrial fibrillation underlying- Personally Reviewed  ECG    Atrial fibrillation with ventricular pacing- Personally Reviewed  Physical Exam   GEN:  Thin, elderly, pleasant no acute distress.   Neck: No JVD Cardiac: RRR occasional ectopy, no murmurs, rubs, or gallops.  Respiratory:  Mild right base decreased breath sounds. GI: Soft, nontender, non-distended  MS: No edema; No deformity. Neuro:   Nonfocal  Psych: Normal affect   Labs    Chemistry Recent Labs  Lab 09/28/18 0450 09/29/18 0406 09/30/18 0311  NA 139 142 139  K 2.7* 2.8* 3.8  CL 105 107 101  CO2 28 29 30   GLUCOSE 103* 95 115*  BUN 13 13 14   CREATININE 0.94 0.85 0.88  CALCIUM 9.3 8.5* 8.6*  GFRNONAA 53* 60* 57*  GFRAA >60 >60 >60  ANIONGAP 6 6 8      Hematology Recent Labs  Lab 09/28/18 0450 09/29/18 0406  WBC 5.9 6.7  RBC 4.08 3.76*  HGB 11.7* 10.8*  HCT 37.6 34.3*  MCV 92.2 91.2  MCH 28.7 28.7  MCHC 31.1 31.5  RDW 14.5 14.4  PLT 197 190    Cardiac Enzymes Recent Labs  Lab 09/28/18 0928 09/28/18 1430 09/28/18 2138  TROPONINI <0.03 <0.03 <0.03    Recent Labs  Lab 09/28/18 0510  TROPIPOC 0.00     BNP Recent Labs  Lab 09/28/18 0450  BNP 828.6*     DDimer No results for input(s): DDIMER in the last 168 hours.   Radiology    Dg Chest 1 View  Result Date: 09/29/2018 CLINICAL DATA:  Status post right thoracentesis. EXAM: CHEST  1 VIEW COMPARISON:  09/28/2018 and older exams FINDINGS: Since the prior exam, there  has been a significant reduction in the size of the right pleural effusion. No pneumothorax. There is a small residual right pleural effusion obscuring the right hemidiaphragm. Stable left pleural effusion with associated atelectasis. Lungs show prominent bronchovascular markings similar to prior exams. Heart is enlarged, stable. IMPRESSION: 1. Significant reduction in right pleural fluid following right-sided thoracentesis. 2. No pneumothorax or evidence of a procedure complication. 3. No other change from the prior exam. Electronically Signed   By: Amie Portlandavid  Ormond M.D.   On: 09/29/2018 13:25   Ir Thoracentesis Asp Pleural Space W/img Guide  Result Date: 09/29/2018 INDICATION: On chronic systolic heart failure. History of atrial fibrillation with pacemaker, pulmonary edema. Request for therapeutic thoracentesis. EXAM: ULTRASOUND GUIDED RIGHT THORACENTESIS MEDICATIONS: 1%  lidocaine 10 mL COMPLICATIONS: None immediate. PROCEDURE: An ultrasound guided thoracentesis was thoroughly discussed with the patient and questions answered. The benefits, risks, alternatives and complications were also discussed. The patient understands and wishes to proceed with the procedure. Written consent was obtained. Ultrasound was performed to localize and mark an adequate pocket of fluid in the right chest. The area was then prepped and draped in the normal sterile fashion. 1% Lidocaine was used for local anesthesia. Under ultrasound guidance a 6 Fr Safe-T-Centesis catheter was introduced. Thoracentesis was performed. The catheter was removed and a dressing applied. FINDINGS: A total of approximately 1.2 L of clear yellow fluid was removed. IMPRESSION: Successful ultrasound guided right thoracentesis yielding 1.2 L of pleural fluid. No pneumothorax seen on post-procedure chest x-ray. Read by: Corrin ParkerWendy Blair, PA-C Electronically Signed   By: Corlis Leak  Hassell M.D.   On: 09/29/2018 13:48    Cardiac Studies   Echocardiogram: 09/29/2018 - Left ventricle: The cavity size was normal. Wall thickness was   normal. Systolic function was normal. The estimated ejection   fraction was in the range of 50% to 55%. Wall motion was normal;   there were no regional wall motion abnormalities. The study is   not technically sufficient to allow evaluation of LV diastolic   function. - Ventricular septum: Septal motion showed abnormal function and   dyssynergy. - Aortic valve: Valve mobility was restricted. There was mild   regurgitation. - Mitral valve: Calcified annulus. There was mild regurgitation. - Left atrium: The atrium was moderately dilated. - Right ventricle: The cavity size was moderately dilated. - Right atrium: The atrium was severely dilated. - Tricuspid valve: There was moderate regurgitation. - Pulmonary arteries: Systolic pressure was mildly increased. PA   peak pressure: 47 mm Hg  (S).  Impressions:  - Low normal LV systolic function; calcified aortic valve with mild   AI; mild MR; biatrial enlargement; moderate RVE; moderate TR with   mild pulmonary hypertension.  Patient Profile     84 y.o. female with permanent atrial fibrillation pacemaker, Coumadin, mild secondary pulmonary hypertension, history of GI bleed, DNR, chronic anemia with recurrent pleural effusions, recurrent chronic diastolic heart failure.  Assessment & Plan    Acute on chronic diastolic heart failure -Let us give her 1 more day of IV Lasix here in the hospital.  Creatinine 0.88. - We will transition to p.o. Lasix tomorrow she was taking 20 mg daily at home.  I will increase to 40 mg daily at home. -Spoke to the daughter about heart healthy diet, ultimately, I want her to eliminate/decrease salt.  I understand that her BMI is 16.  To be honest, for calories, I would be comfortable with her having a milkshake or 2.  Try to avoid  soup.  Permanent atrial fibrillation -Confirmed by [employer] representative. -Stop amiodarone 100 mg a day.  She is not a candidate for rhythm control. -Continue with diltiazem and metoprolol for rate control.  Pacemaker -[employer], doing well.  Recurrent pleural effusions - Hopefully with increase in Lasix we will decrease the chances of this reaccumulating.  Chronic anti-coagulation -On warfarin.   Hopeful discharge tomorrow.     For questions or updates, please contact [facility] Please consult [email or web address] for contact info under        Signed, Donato Schultz, MD  09/30/2018, 10:06 AM

## 2018-10-01 ENCOUNTER — Telehealth: Payer: Self-pay | Admitting: Nurse Practitioner

## 2018-10-01 LAB — CBC WITH DIFFERENTIAL/PLATELET
Abs Immature Granulocytes: 0.02 10*3/uL (ref 0.00–0.07)
Basophils Absolute: 0.1 10*3/uL (ref 0.0–0.1)
Basophils Relative: 1 %
Eosinophils Absolute: 0.5 10*3/uL (ref 0.0–0.5)
Eosinophils Relative: 7 %
HCT: 35.2 % — ABNORMAL LOW (ref 36.0–46.0)
Hemoglobin: 11.2 g/dL — ABNORMAL LOW (ref 12.0–15.0)
Immature Granulocytes: 0 %
Lymphocytes Relative: 20 %
Lymphs Abs: 1.4 10*3/uL (ref 0.7–4.0)
MCH: 29.1 pg (ref 26.0–34.0)
MCHC: 31.8 g/dL (ref 30.0–36.0)
MCV: 91.4 fL (ref 80.0–100.0)
Monocytes Absolute: 0.9 10*3/uL (ref 0.1–1.0)
Monocytes Relative: 12 %
Neutro Abs: 4.3 10*3/uL (ref 1.7–7.7)
Neutrophils Relative %: 60 %
Platelets: 199 10*3/uL (ref 150–400)
RBC: 3.85 MIL/uL — ABNORMAL LOW (ref 3.87–5.11)
RDW: 14.5 % (ref 11.5–15.5)
WBC: 7.2 10*3/uL (ref 4.0–10.5)
nRBC: 0 % (ref 0.0–0.2)

## 2018-10-01 LAB — RENAL FUNCTION PANEL
Albumin: 3.4 g/dL — ABNORMAL LOW (ref 3.5–5.0)
Anion gap: 9 (ref 5–15)
BUN: 20 mg/dL (ref 8–23)
CO2: 31 mmol/L (ref 22–32)
Calcium: 8.8 mg/dL — ABNORMAL LOW (ref 8.9–10.3)
Chloride: 99 mmol/L (ref 98–111)
Creatinine, Ser: 0.97 mg/dL (ref 0.44–1.00)
GFR calc Af Amer: 59 mL/min — ABNORMAL LOW (ref >60)
GFR calc non Af Amer: 51 mL/min — ABNORMAL LOW (ref >60)
Glucose, Bld: 102 mg/dL — ABNORMAL HIGH (ref 70–99)
Phosphorus: 3.6 mg/dL (ref 2.5–4.6)
Potassium: 3.5 mmol/L (ref 3.5–5.1)
Sodium: 139 mmol/L (ref 135–145)

## 2018-10-01 LAB — PROTIME-INR
INR: 2.35
PROTHROMBIN TIME: 25.4 s — AB (ref 11.4–15.2)

## 2018-10-01 LAB — MAGNESIUM: Magnesium: 1.7 mg/dL (ref 1.7–2.4)

## 2018-10-01 MED ORDER — FUROSEMIDE 40 MG PO TABS: 40.0000 mg | ORAL_TABLET | Freq: Every day | ORAL | Status: DC

## 2018-10-01 MED ORDER — METOPROLOL TARTRATE 25 MG PO TABS: 25.0000 mg | ORAL_TABLET | Freq: Two times a day (BID) | ORAL | 0 refills | Status: DC

## 2018-10-01 MED ORDER — ADULT MULTIVITAMIN W/MINERALS CH
1.0000 | ORAL_TABLET | Freq: Every day | ORAL | Status: DC
  Administered 2018-10-01: 1 via ORAL
  Filled 2018-10-01: qty 1

## 2018-10-01 MED ORDER — MAGNESIUM OXIDE 400 (241.3 MG) MG PO TABS
400.0000 mg | ORAL_TABLET | Freq: Every day | ORAL | Status: DC
  Administered 2018-10-01: 400 mg via ORAL
  Filled 2018-10-01: qty 1

## 2018-10-01 MED ORDER — ADULT MULTIVITAMIN W/MINERALS CH: 1.0000 | ORAL_TABLET | Freq: Every day | ORAL | 0 refills | Status: DC

## 2018-10-01 MED ORDER — FUROSEMIDE 40 MG PO TABS: 40.0000 mg | ORAL_TABLET | Freq: Every day | ORAL | 0 refills | Status: DC

## 2018-10-01 MED ORDER — BOOST / RESOURCE BREEZE PO LIQD CUSTOM
1.0000 | Freq: Three times a day (TID) | ORAL | Status: DC
  Administered 2018-10-01 (×2): 1 via ORAL

## 2018-10-01 MED ORDER — POTASSIUM CHLORIDE CRYS ER 20 MEQ PO TBCR
40.0000 meq | EXTENDED_RELEASE_TABLET | Freq: Once | ORAL | Status: AC
  Administered 2018-10-01: 40 meq via ORAL
  Filled 2018-10-01: qty 2

## 2018-10-01 MED ORDER — POTASSIUM CHLORIDE CRYS ER 20 MEQ PO TBCR: 20.0000 meq | EXTENDED_RELEASE_TABLET | Freq: Every day | ORAL | Status: DC

## 2018-10-01 MED ORDER — DILTIAZEM HCL ER COATED BEADS 180 MG PO CP24: 180.0000 mg | ORAL_CAPSULE | Freq: Every day | ORAL | 0 refills | Status: DC

## 2018-10-01 NOTE — Progress Notes (Addendum)
Progress Note  Patient Name: Carol Snyder Date of Encounter: 10/01/2018  Primary Cardiologist: Sandy SalaamAkbary, Ali, MD  Subjective   Had some stomach upset overnight but cardiac status is stable. No further SOB. Edema totally resolved. No CP.  Inpatient Medications    Scheduled Meds: . aspirin EC  81 mg Oral Daily  . diltiazem  180 mg Oral Daily  . feeding supplement (ENSURE ENLIVE)  237 mL Oral TID BM  . furosemide  40 mg Intravenous Q12H  . loperamide  4 mg Oral Daily  . mouth rinse  15 mL Mouth Rinse BID  . metoprolol tartrate  25 mg Oral BID  . sodium chloride flush  3 mL Intravenous Q12H  . Warfarin - Pharmacist Dosing Inpatient   Does not apply q1800   Continuous Infusions: . sodium chloride Stopped (09/29/18 1712)   PRN Meds: sodium chloride, acetaminophen, diphenoxylate-atropine, lidocaine (PF), ondansetron (ZOFRAN) IV, sodium chloride flush   Vital Signs    Vitals:   09/30/18 2359 10/01/18 0316 10/01/18 0519 10/01/18 0834  BP: 116/69  129/78 134/72  Pulse: 79  81 80  Resp: 18  18   Temp: 98.3 F (36.8 C)  98.2 F (36.8 C)   TempSrc: Oral  Oral   SpO2: 93%  93% 98%  Weight:  46.6 kg    Height:        Intake/Output Summary (Last 24 hours) at 10/01/2018 1004 Last data filed at 10/01/2018 0827 Gross per 24 hour  Intake 366.96 ml  Output 401 ml  Net -34.04 ml   Filed Weights   09/29/18 0347 09/30/18 0443 10/01/18 0316  Weight: 47.3 kg 46.9 kg 46.6 kg    Telemetry    Atrial fib, persistent, rate controlled, occ demand pacing - Personally Reviewed  Physical Exam   GEN: No acute distress, frail, cachectic F  HEENT: Normocephalic, atraumatic, sclera non-icteric. Neck: No JVD or bruits. Cardiac: Irregularly irregular, rate controlled, no murmurs, rubs, or gallops.  Radials/DP/PT 1+ and equal bilaterally.  Respiratory: Clear to auscultation bilaterally. Breathing is unlabored. GI: Soft, nontender, non-distended, BS +x 4. MS: no deformity. Extremities:  No clubbing or cyanosis. No edema. Distal pedal pulses are 2+ and equal bilaterally. Neuro:  AAOx3. Follows commands. Psych:  Responds to questions appropriately with a normal affect.  Labs    Chemistry Recent Labs  Lab 09/29/18 0406 09/30/18 0311 10/01/18 0526  NA 142 139 139  K 2.8* 3.8 3.5  CL 107 101 99  CO2 29 30 31   GLUCOSE 95 115* 102*  BUN 13 14 20   CREATININE 0.85 0.88 0.97  CALCIUM 8.5* 8.6* 8.8*  ALBUMIN  --   --  3.4*  GFRNONAA 60* 57* 51*  GFRAA >60 >60 59*  ANIONGAP 6 8 9      Hematology Recent Labs  Lab 09/28/18 0450 09/29/18 0406 10/01/18 0526  WBC 5.9 6.7 7.2  RBC 4.08 3.76* 3.85*  HGB 11.7* 10.8* 11.2*  HCT 37.6 34.3* 35.2*  MCV 92.2 91.2 91.4  MCH 28.7 28.7 29.1  MCHC 31.1 31.5 31.8  RDW 14.5 14.4 14.5  PLT 197 190 199    Cardiac Enzymes Recent Labs  Lab 09/28/18 0928 09/28/18 1430 09/28/18 2138  TROPONINI <0.03 <0.03 <0.03    Recent Labs  Lab 09/28/18 0510  TROPIPOC 0.00     BNP Recent Labs  Lab 09/28/18 0450  BNP 828.6*     Radiology    Dg Chest 1 View  Result Date: 09/29/2018 CLINICAL DATA:  Status  post right thoracentesis. EXAM: CHEST  1 VIEW COMPARISON:  09/28/2018 and older exams FINDINGS: Since the prior exam, there has been a significant reduction in the size of the right pleural effusion. No pneumothorax. There is a small residual right pleural effusion obscuring the right hemidiaphragm. Stable left pleural effusion with associated atelectasis. Lungs show prominent bronchovascular markings similar to prior exams. Heart is enlarged, stable. IMPRESSION: 1. Significant reduction in right pleural fluid following right-sided thoracentesis. 2. No pneumothorax or evidence of a procedure complication. 3. No other change from the prior exam. Electronically Signed   By: Amie Portland M.D.   On: 09/29/2018 13:25   Ir Thoracentesis Asp Pleural Space W/img Guide  Result Date: 09/29/2018 INDICATION: On chronic systolic heart  failure. History of atrial fibrillation with pacemaker, pulmonary edema. Request for therapeutic thoracentesis. EXAM: ULTRASOUND GUIDED RIGHT THORACENTESIS MEDICATIONS: 1% lidocaine 10 mL COMPLICATIONS: None immediate. PROCEDURE: An ultrasound guided thoracentesis was thoroughly discussed with the patient and questions answered. The benefits, risks, alternatives and complications were also discussed. The patient understands and wishes to proceed with the procedure. Written consent was obtained. Ultrasound was performed to localize and Bebe Moncure an adequate pocket of fluid in the right chest. The area was then prepped and draped in the normal sterile fashion. 1% Lidocaine was used for local anesthesia. Under ultrasound guidance a 6 Fr Safe-T-Centesis catheter was introduced. Thoracentesis was performed. The catheter was removed and a dressing applied. FINDINGS: A total of approximately 1.2 L of clear yellow fluid was removed. IMPRESSION: Successful ultrasound guided right thoracentesis yielding 1.2 L of pleural fluid. No pneumothorax seen on post-procedure chest x-ray. Read by: Corrin Parker, PA-C Electronically Signed   By: Corlis Leak M.D.   On: 09/29/2018 13:48    Cardiac Studies   2d echo 09/29/18 Study Conclusions - Left ventricle: The cavity size was normal. Wall thickness was normal. Systolic function was normal. The estimated ejection fraction was in the range of 50% to 55%. Wall motion was normal;   there were no regional wall motion abnormalities. The study is not technically sufficient to allow evaluation of LV diastolic function. - Ventricular septum: Septal motion showed abnormal function and dyssynergy. - Aortic valve: Valve mobility was restricted. There was mild regurgitation. - Mitral valve: Calcified annulus. There was mild regurgitation. - Left atrium: The atrium was moderately dilated. - Right ventricle: The cavity size was moderately dilated. - Right atrium: The atrium was severely  dilated. - Tricuspid valve: There was moderate regurgitation. - Pulmonary arteries: Systolic pressure was mildly increased. PA peak pressure: 47 mm Hg (S). Impressions: - Low normal LV systolic function; calcified aortic valve with mild AI; mild MR; biatrial enlargement; moderate RVE; moderate TR with mild pulmonary hypertension.  Patient Profile     84 y.o. female with a hx of persistent atrial fibrillation on amiodarone and warfarin, prior sinus node dysfunction s/p Biotonik PPM, mild-mod AI/mild-mod MR/mod TR/mild-moderately increased PASP 05/2018, IBS, HTN, h/o GIB, reported CVA (although MRI was reportedly negative), orthostatic hypotension, chronic diastolic CHF, recurrent falls, chronic diarrhea, DNR, chronic appearing anemia who is being seen today for the evaluation of CHF at the request of Dr Ophelia Charter.  Assessment & Plan    1. Recurrent bilateral pleural effusions, R>L, likely related to acute on chronic diastolic CHF - s/p thoracentesis 06/2018 and this admission. Last tap's path showed no evidence of malignancy, felt to be transudative. She has had weight loss over the last 6 months which will need attention in OP setting. Her  I/O's do not appear accurate. Clinically she is much improved. She was on Lasix 20mg  daily at home, will d/c IV Lasix (got today) and change to Lasix 40mg  daily tomorrow AM. If this recurs again, consideration can be given to pulm eval.  2. Valvular heart disease with mild AI/mild MR/mod TR - stable when compared to prior.  3. Persistent atrial fibrillation with h/o sinus node dysfunction s/p Biotronik PPM - was on amiodarone as OP. It is difficult to tell from recent office notes if she was in NSR at that time (notes indicate atrial fib was "controlled.") Notes also indicate amiodarone was not previously increased due to GI complaints. Amiodarone was stopped this admission as atrial fib felt permanent and Dr. Anne Fu did not think she was a candidate for rhythm  control. She will continue diltiazem and metoprolol. She probably does not need aspirin - I do not see an indication for this and she was not on it at home so will stop. Patient asked my opinion on NOAC vs warfarin as she has had some issues lately regulating her INR. We discussed potential for changing and she will give it some more thought.  4. Severe hypokalemia/hypomagnesemia - daughter indicates etiology of chronic diarrhea not totally understood, followed by [facility]. Internal medicine repleted lytes earlier this admission. K still 3.5 and Mg 1.7. Will give KCL today followed by daily, and start MagOx 400mg  daily. Need to be careful with too much mag given h/o diarrhea.  5. Normocytic anemia - appears stable.  Daughter is struggling with where to have patient f/u as she does not feel her concerns of fluid overload have been well managed in outpatient setting. She and the patient will discuss this further. Regardless of where patient chooses to f/u, should be seen within 1 week for transition of care visit. If she chooses [facility] please let us know so we can arrange. Otherwise she typically sees Dr. Reche Dixon in [place]. Patient lives in [place]. We also discussed 2g sodium diet, 2L fluid restriction, daily weights and early notification of symptoms.  For questions or updates, please contact [facility] Please consult [email or web address] for contact info under Cardiology/STEMI.  Signed, Laurann Montana, PA-C 10/01/2018, 10:04 AM    Personally seen and examined. Agree with above.  Feeling better.  No chest pain, decreased shortness of breath.  Thin, frail, blunted breath sounds right lower lobe posteriorly, irregularly irregular, pacemaker noted no edema.  Lab work reviewed.  Acute chronic diastolic heart failure with recurrent bilateral right greater than left pleural effusion - Feels much better.  Changing to Lasix 40 mg p.o. once a day.  Was taking 20 at  home.  Biotronik pacemaker with persistent atrial fibrillation -Off of amiodarone.  Continue with metoprolol and diltiazem.  Chronic anticoagulation - Would be fine if she would desire to switch to Eliquis 2.5 mg twice a day  Failure to thrive/severe protein malnutrition - Challenges with daily weights.  [facility] will sign off.   Medication Recommendations:  Lasix 40 QD Other recommendations (labs, testing, etc):  none Follow up as an outpatient:  We will sched.   Donato Schultz, MD

## 2018-10-01 NOTE — Progress Notes (Signed)
Patient's family member questions were resolved. Spoke to MD and viewed notes from cardiology regarding amiodarone.  No further questions at this moment. "living with heart failure" packet was given with education and teach back. IV peripheral was removed, clean, dry and intact and pressure applied. No  Complains of shortness of breath or chest pain. transported in wheelchair by tech and family member.

## 2018-10-01 NOTE — Progress Notes (Signed)
Physical Therapy Treatment Patient Details Name: Carol Snyder MRN: [id number] DOB: 1929/07/12 Today's Date: 10/01/2018    History of Present Illness Pt is an 84 y.o. female admitted from home on 09/28/18 with SOB. Worked up for CHF. S/p thoracentesis 11/13. PMH includes Afib, CVA, pacemaker.    PT Comments    Pt progressing well with mobility. Ambulatory with RW at [place]. SpO2 down to 88% with ambulation, no DOE noted. Pt motivated to return home this afternoon. Has necessary assist from family/friends. Pt has met short-term acute PT goals. Pt and daughter have no further questions or concerns. Pt has been ambulating hallway with supervision from daughter. D/c acute PT.    Follow Up Recommendations  Home health PT;Supervision for mobility/OOB     Equipment Recommendations  3in1 (PT)    Recommendations for Other Services       Precautions / Restrictions Precautions Precautions: Fall Restrictions Weight Bearing Restrictions: No    Mobility  Bed Mobility               General bed mobility comments: Received sitting in recliner  Transfers Overall transfer level: Modified independent Equipment used: Rolling walker (2 wheeled) Transfers: Sit to/from Stand Sit to Stand: Modified independent (Device/Increase time)            Ambulation/Gait Ambulation/Gait assistance: Supervision Gait Distance (Feet): 200 Feet Assistive device: Rolling walker (2 wheeled) Gait Pattern/deviations: Decreased stride length;Step-through pattern;Trunk flexed Gait velocity: Decreased Gait velocity interpretation: 1.31 - 2.62 ft/sec, indicative of limited community ambulator General Gait Details: Slow, steady amb with RW; supervision for safety. SpO2 down to 88% on RA   Stairs             Wheelchair Mobility    Modified Rankin (Stroke Patients Only)       Balance Overall balance assessment: Needs assistance   Sitting balance-Leahy Scale: Good     Standing  balance support: Bilateral upper extremity supported;No upper extremity supported Standing balance-Leahy Scale: Fair Standing balance comment: Can static stand without UE support; dynamic stability improved with BUE support                            Cognition Arousal/Alertness: Awake/alert Behavior During Therapy: WFL for tasks assessed/performed Overall Cognitive Status: Within Functional Limits for tasks assessed                                 General Comments: HOH      Exercises      General Comments General comments (skin integrity, edema, etc.): Daughter present and supportive; had ambulated with pt to nursing station this morning      Pertinent Vitals/Pain Pain Assessment: No/denies pain    Home Living                      Prior Function            PT Goals (current goals can now be found in the care plan section) Acute Rehab PT Goals Patient Stated Goal: Hopeful for discharge home today PT Goal Formulation: With patient/family Time For Goal Achievement: 10/12/18 Potential to Achieve Goals: Good Progress towards PT goals: Progressing toward goals    Frequency    Min 3X/week      PT Plan Current plan remains appropriate    Co-evaluation  AM-PAC PT "6 Clicks" Daily Activity  Outcome Measure  Difficulty turning over in bed (including adjusting bedclothes, sheets and blankets)?: A Little Difficulty moving from lying on back to sitting on the side of the bed? : A Little Difficulty sitting down on and standing up from a chair with arms (e.g., wheelchair, bedside commode, etc,.)?: None Help needed moving to and from a bed to chair (including a wheelchair)?: A Little Help needed walking in hospital room?: A Little Help needed climbing 3-5 steps with a railing? : A Little 6 Click Score: 19    End of Session Equipment Utilized During Treatment: Gait belt;Oxygen Activity Tolerance: Patient tolerated  treatment well Patient left: in chair;with family/visitor present;with call bell/phone within reach Nurse Communication: Mobility status PT Visit Diagnosis: Other abnormalities of gait and mobility (R26.89);Muscle weakness (generalized) (M62.81)     Time: 8102-5486 PT Time Calculation (min) (ACUTE ONLY): 14 min  Charges:  $Gait Training: 8-22 mins                     Mabeline Caras, PT, DPT Acute Rehabilitation Services  Pager [phone] Office [place] 10/01/2018, 2:04 PM

## 2018-10-01 NOTE — Telephone Encounter (Signed)
Pt is still currently in the hospital.  Nursing to follow-up with the pt on Monday 11/18, to place TCM call.

## 2018-10-01 NOTE — Progress Notes (Signed)
   Received message from Dr. Anne FuSkains that patient has made decision to follow-up with our office. TOC f/u has been requested and arranged 10/12/18 at 2pm with Norma FredricksonLori Gerhardt, NP on Dr. Anne FuSkains' care team. I also sent a message to Glynda JaegerMelissa Tatum, EP scheduler, to help facilitate new EP appointment. They will call her with this information. Of note, patient and daughter want the phone call to go to the daughter's cell so I updated her number as the primary number in Epic.  Dayna Dunn PA-C

## 2018-10-01 NOTE — Progress Notes (Signed)
ANTICOAGULATION CONSULT NOTE  Pharmacy Consult for warfarin Indication: atrial fibrillation and hx stroke  Allergies  Allergen Reactions  . Codeine Other (See Comments)    unknown  . Enbrel [Etanercept] Other (See Comments)    unknown  . Fosamax [Alendronate Sodium] Other (See Comments)    unknown  . Hydromorphone Other (See Comments)    unknown  . Motrin [Ibuprofen] Other (See Comments)    Due to blood thinner   . Sulfa Antibiotics Other (See Comments)    Childhood allergy   . Voltaren [Diclofenac] Other (See Comments)    unknown  . Naproxen Sodium Palpitations    Patient Measurements: Height: 5\' 7"  (170.2 cm) Weight: 102 lb 12.8 oz (46.6 kg) IBW/kg (Calculated) : 61.6  Vital Signs: Temp: 98.2 F (36.8 C) (11/15 0519) Temp Source: Oral (11/15 0519) BP: 134/72 (11/15 0834) Pulse Rate: 80 (11/15 0834)  Labs: Recent Labs    09/28/18 0928  09/28/18 1430 09/28/18 2138 09/29/18 0406 09/30/18 0311 10/01/18 0526  HGB  --   --   --   --  10.8*  --  11.2*  HCT  --   --   --   --  34.3*  --  35.2*  PLT  --   --   --   --  190  --  199  LABPROT  --    < >  --   --  17.2* 18.3* 25.4*  INR  --    < >  --   --  1.42 1.54 2.35  CREATININE  --   --   --   --  0.85 0.88 0.97  TROPONINI <0.03  --  <0.03 <0.03  --   --   --    < > = values in this interval not displayed.    Estimated Creatinine Clearance: 30.1 mL/min (by C-G formula based on SCr of 0.97 mg/dL).   Assessment: 89 YOF with hx of afib on warfarin PTA. PTA amiodarone discontinued on 11/13.   INR increased from 1.54 to 2.35 today - now therapeutic. Anticipate further INR rise - likely related to higher than PTA dosing d/t subtherapeutic INR on admission. Hgb 11.2, plt 199.    PTA dosing: 2.5mg  two days in a row, then no dose on third day, repeated as cycle  Goal of Therapy:  INR 2-3 Monitor platelets by anticoagulation protocol: Yes   Plan:  Hold warfarin tonight to curb INR increase  Daily  INR  Sherron MondayKimberly Briannie Gutierrez, PharmD Clinical Pharmacist  Pager: [phone] Phone: [phone] **Pharmacist phone directory can now be found on [email or web address] ([email or web address]).  Listed under [facility].

## 2018-10-01 NOTE — Progress Notes (Signed)
Patients family member request more information on why patient is not going home with amiodarone. Requested to ask attending MD. MD paged.

## 2018-10-01 NOTE — Evaluation (Signed)
Occupational Therapy Evaluation Patient Details Name: Carol Snyder MRN: [id number] DOB: 1929-03-26 Today's Date: 10/01/2018    History of Present Illness 84 yo female admitted from home presenting with SOB. Dx CHF. PMhx: Afib, CVA, pacemaker. Thoracentesis on 11/13   Clinical Impression   PATIENT IS REQUIRING INCREASED ASSISTANCE FROM BASELINE AND WOULD BENEFIT FROM CONTINUED OT IN HOSPITAL AND AT HOME. PATIENT IS MOTIVATED TO WORK WITH OT TO REACH GOALS. PATIENT HAS SUPPORTIVE FAMILY THAT CAN PROVIDE NEAR 24 HOUR ASSIT.     Follow Up Recommendations  Home health OT    Equipment Recommendations  (PATIENT WANTS PADDED BSC)    Recommendations for Other Services       Precautions / Restrictions Precautions Precautions: Fall Precaution Comments: watch sats Restrictions Weight Bearing Restrictions: No      Mobility Bed Mobility Overal bed mobility: Modified Independent                Transfers       Sit to Stand: Min guard Stand pivot transfers: Min guard       General transfer comment: Min guard for shower and transfer to commode    Balance                                           ADL either performed or assessed with clinical judgement   ADL Overall ADL's : Needs assistance/impaired Eating/Feeding: Independent   Grooming: Wash/dry hands;Wash/dry face;Oral care;Brushing hair;Supervision/safety;Set up;Standing   Upper Body Bathing: Set up;Sitting   Lower Body Bathing: Set up;Min guard;Sit to/from stand   Upper Body Dressing : Supervision/safety;Set up;Sitting   Lower Body Dressing: Min guard;Sit to/from stand   Toilet Transfer: Min guard;Ambulation;Comfort height toilet   Toileting- Clothing Manipulation and Hygiene: Supervision/safety   Tub/ Shower Transfer: Min guard;Ambulation   Functional mobility during ADLs: Min guard;Rolling walker General ADL Comments: [name] IS S TO MIN GUARD ASSIST FOR ADLS WITH INCREASED WEAKNESS FROM  BASELINE     Vision Baseline Vision/History: Wears glasses Wears Glasses: At all times Vision Assessment?: No apparent visual deficits     Perception     Praxis      Pertinent Vitals/Pain Pain Assessment: No/denies pain     Hand Dominance Right   Extremity/Trunk Assessment Upper Extremity Assessment Upper Extremity Assessment: Generalized weakness           Communication Communication Communication: No difficulties   Cognition Arousal/Alertness: Awake/alert Behavior During Therapy: WFL for tasks assessed/performed Overall Cognitive Status: Within Functional Limits for tasks assessed                                 General Comments: HOH   General Comments       Exercises     Shoulder Instructions      Home Living Family/patient expects to be discharged to:: Private residence Living Arrangements: Alone Available Help at Discharge: Family;Available PRN/intermittently;Friend(s)(PATIENT DTR STATES [name] IS USUALLY ALONE ONLY 2-3 HOURS A DAY) Type of Home: House Home Access: Ramped entrance     Home Layout: One level     Bathroom Shower/Tub: Producer, television/film/video: Handicapped height Bathroom Accessibility: Yes How Accessible: Accessible via walker;Other (comment) Home Equipment: Walker - 2 wheels;Walker - 4 wheels;Cane - single point;Wheelchair - [employer];Shower seat - built in   Additional Comments: pt  tub/shower and toilet are in separate bathrooms;bathroom with toilet is not accessible via walker;bathroom with tub/shower is accessible via walker      Prior Functioning/Environment Level of Independence: Needs assistance  Gait / Transfers Assistance Needed: PATIENT IS S WITH AMB WITH WALKER, MIN A WITH TRANSFER TO SHOWER,  ADL's / Homemaking Assistance Needed: PATIENT WAS S FOR SHOWER AND MOD I WITH SINK BATH AND SETUP FOR DRESSING. FAMILY DID THE COOKING AND CLEANING   Comments: pt ambulates with RW or uses WC in  home, depends on how she feels and what family member is helping her. Can walk grossly 140' with RW at times.         OT Problem List: Decreased strength;Decreased activity tolerance;Cardiopulmonary status limiting activity      OT Treatment/Interventions: Self-care/ADL training;Energy conservation;Therapeutic activities    OT Goals(Current goals can be found in the care plan section) Acute Rehab OT Goals Patient Stated Goal: GO HOME OT Goal Formulation: With patient Time For Goal Achievement: 10/15/18 Potential to Achieve Goals: Good  OT Frequency: Min 2X/week   Barriers to D/C:            Co-evaluation              AM-PAC PT "6 Clicks" Daily Activity     Outcome Measure Help from another person eating meals?: None Help from another person taking care of personal grooming?: A Little Help from another person toileting, which includes using toliet, bedpan, or urinal?: A Little Help from another person bathing (including washing, rinsing, drying)?: A Little Help from another person to put on and taking off regular upper body clothing?: A Little Help from another person to put on and taking off regular lower body clothing?: A Little 6 Click Score: 19   End of Session Equipment Utilized During Treatment: Gait belt;Rolling walker Nurse Communication: (OK THERAPY)  Activity Tolerance: Patient tolerated treatment well Patient left: in chair;with call bell/phone within reach;with family/visitor present  OT Visit Diagnosis: Unsteadiness on feet (R26.81);Muscle weakness (generalized) (M62.81)                Time: [id number] OT Time Calculation (min): 38 min Charges:  OT Evaluation $OT Eval Low Complexity: 1 Low OT Treatments $Self Care/Home Management : 8-22 mins  6 CLICKS  Carol Snyder 10/01/2018, 8:38 AM

## 2018-10-01 NOTE — Telephone Encounter (Signed)
TOC Patient-  Please Call Patient-  Patient has an appointment with Norma FredricksonLori Gerhardt on 10-12-18.

## 2018-10-01 NOTE — Progress Notes (Addendum)
Nutrition Follow-up  DOCUMENTATION CODES:   Severe malnutrition in context of chronic illness, Underweight  INTERVENTION:   -Continue snacks BID -D/c Ensure Enlive po BID, each supplement provides 350 kcal and 20 grams of protein -Continue Magic Cup BID with meals, each supplement provides 290 kcals and 9 grams protein -Boost Breeze po TID, each supplement provides 250 kcal and 9 grams of protein -MVI with minerals daily -D/c calorie count  NUTRITION DIAGNOSIS:   Severe Malnutrition related to diarrhea as evidenced by severe muscle depletion, moderate fat depletion, percent weight loss.  Ongoing  GOAL:   Patient will meet greater than or equal to 90% of their needs  Progressing  MONITOR:   PO intake, Supplement acceptance  REASON FOR ASSESSMENT:   Consult Assessment of nutrition requirement/status  ASSESSMENT:   Pt with a PMH of AFIB, HF, CVA and a pacemaker. Pt admitted with shortness of breath due to acute on chronic CHF.  RD re-consulted for assessment and calorie count. RD evaluated pt yesterday (09/30/18); refer to note for further details. Spoke with RD who assessed pt who reports pt is eating well, but tends to be a selective eater.   Spoke with pt and daughter at bedside. Pt reports that she is still struggling with diarrhea. She consumed a good breakfast and dinner (noted meal completion 75-100%). Pt consumed an orange, fruit cup, and english muffin for breakfast- approximately 285 kcals ands 6 grams of protein). Per daughter, pt has also been consuming her between meal nourishments. Pt consumed an Ensure supplement this morning, however, experienced diarrhea soon after drinking- she feel this supplement may be "too heavy for her". Discussed alternate supplements on formulary. Pt amenable to [facility], as she consumed and accepted Ensure Clear (similar product) when she was at [facility].   Reinforced importance of good meal and supplement intake to promote  healing.   Labs reviewed: K, Mg, and Phos WDL.   Diet Order:   Diet Order            Diet Heart Room service appropriate? Yes; Fluid consistency: Thin; Fluid restriction: 1500 mL Fluid  Diet effective now              EDUCATION NEEDS:   Education needs have been addressed  Skin:  Skin Assessment: Reviewed RN Assessment  Last BM:  10/01/18  Height:   Ht Readings from Last 1 Encounters:  09/28/18 5\' 7"  (1.702 m)    Weight:   Wt Readings from Last 1 Encounters:  10/01/18 46.6 kg    Ideal Body Weight:  61.3 kg  BMI:  Body mass index is 16.1 kg/m.  Estimated Nutritional Needs:   Kcal:  1300-1500  Protein:  60-75 grams  Fluid:  1.5 L    Nikeia Henkes A. Mayford KnifeWilliams, RD, LDN, CDE Pager: [phone] After hours Pager: [phone]

## 2018-10-01 NOTE — Care Management Important Message (Signed)
Important Message  Patient Details  Name: Joeseph AmorSara K Bogan MRN: [id number] Date of Birth: 06-06-1929   Medicare Important Message Given:  Yes    Carissa Musick Stefan ChurchBratton 10/01/2018, 3:19 PM

## 2018-10-04 NOTE — Telephone Encounter (Signed)
Patient's daughter, Aram BeechamCynthia, contacted [facility].  Patient understands to follow up with provider Norma FredricksonLori Gerhardt, NP on 10/12/2018 at 2:00 at [address] [place]. Patient understands discharge instructions? Yes Patient understands medications and regiment? Yes Patient understands to bring all medications to this visit? Yes  The pt came to the phone and gave permission for me to talk to her daughter, Aram BeechamCynthia, concerning this TCM Call.

## 2018-10-12 ENCOUNTER — Encounter: Payer: Self-pay | Admitting: Nurse Practitioner

## 2018-10-12 ENCOUNTER — Ambulatory Visit: Payer: Medicare Other | Admitting: Nurse Practitioner

## 2018-10-12 VITALS — BP 130/80 | HR 78 | Ht 65.0 in | Wt 111.8 lb

## 2018-10-12 DIAGNOSIS — J90 Pleural effusion, not elsewhere classified: Secondary | ICD-10-CM

## 2018-10-12 DIAGNOSIS — I4821 Permanent atrial fibrillation: Secondary | ICD-10-CM | POA: Diagnosis not present

## 2018-10-12 DIAGNOSIS — Z7901 Long term (current) use of anticoagulants: Secondary | ICD-10-CM

## 2018-10-12 DIAGNOSIS — I509 Heart failure, unspecified: Secondary | ICD-10-CM

## 2018-10-12 DIAGNOSIS — I38 Endocarditis, valve unspecified: Secondary | ICD-10-CM

## 2018-10-12 NOTE — Patient Instructions (Addendum)
We will be checking the following labs today - NONE   Medication Instructions:    Continue with your current medicines.    If you need a refill on your cardiac medications before your next appointment, please call your pharmacy.     Testing/Procedures To Be Arranged:  N/A  Follow-Up:   See Dr. Johney FrameAllred next week as planned  See Dr. Anne FuSkains in about 6 weeks    At [facility], you and your health needs are our priority.  As part of our continuing mission to provide you with exceptional heart care, we have created designated Provider Care Teams.  These Care Teams include your primary Cardiologist (physician) and Advanced Practice Providers (APPs -  Physician Assistants and Nurse Practitioners) who all work together to provide you with the care you need, when you need it.  Special Instructions:  . Will plan to repeat a CXR in about 2 weeks - [facility] on the first floor for a chest Xray - you may walk in.  . Try to elevate your legs as best you can.  Marland Kitchen. Salt restriction as we talked about  Call the [facility] office at [phone] if you have any questions, problems or concerns.

## 2018-10-12 NOTE — Progress Notes (Signed)
CARDIOLOGY OFFICE NOTE  Date:  10/12/2018    Joeseph Amor Date of Birth: 05-15-1929 Medical Record #[id number]  PCP:  Kaleen Mask, MD  Cardiologist:  [facility]   Chief Complaint  Patient presents with  . Atrial Fibrillation    Post hospital visit - seen for Dr. Anne Fu    History of Present Illness: Carol Snyder is a 84 y.o. female who presents today for a TOC visit. Seen for Dr. Anne Fu. Saw Dr. Elease Hashimoto many years ago.   She has a hx of persistent atrial fibrillation on amiodarone and warfarin, prior sinus node dysfunction s/p Biotronik PPM, mild-mod AI/mild-mod MR/mod TR/mild-moderately increased PASP 05/2018, IBS, HTN, h/o GIB, reported CVA (although MRI was reportedly negative), orthostatic hypotension, chronic diastolic CHF, recurrent falls, chronic diarrhea, DNR, & chronic appearing anemia.   She was last seen here in this office back in 2015 by Dr. Elease Hashimoto. She has apparently been followed by cardiology in [facility] since.   Seems to have had general decline over the past several months.  Back in 05/2018 she was admitted to [facility] with recurrent falls felt due to orthostasis and acute on chronic diarrhea, AKI, and severe malnutrition in context of chronic illness. 2D echo 05/2018 showed EF 50-55%, septal motion paradox c/w RV pacing, mild-moderate aortic insufficiency, mild-moderate mitral regurgitation, moderate tricuspid regurgitation, and mild-moderately increased PA systolic pressure . In 06/2018 she was admitted with bilateral pleural effusions, R>L, requiring thoracentesis. Fluid was transudate by Light's criteria with cytology negative for malignancy, only chronic inflammation. Patient and daughter reported that periodically she will have issues with edema. They expressed frustration over conflicting advice from primary care (to do Lasix daily) and cardiology (to minimize to 3x/week to protect kidneys).  Earlier this month, she developed worsening orthopnea, lower  extremity edema, and dyspnea to the point where even minimal activity caused SOB. She was brought to the hospital -  CXR yesterday showed enlarging moderate to large right sided pleural effusion with small left pleural effusion, worsening bilateral airspace opacification particularly on the right may reflect asymmetric pulmonary edema.  She was given 80mg  IV Lasix yesterday and additional 40mg  this morning. She underwent thoracentesis today yielding 1.2L of pleural fluid. She improved clinically.  She does not usually weigh daily because of vanity per daughter - does not like seeing weight go down. She did report approximately 20lb weight loss, unintentional, over the last 6 months. Her amiodarone was stopped due to permanent AF. Noted lots of frustration by the family regarding her outpatient care.   Comes in today. Here with her daughter. She is in a wheelchair. She says she feels fine. Her swelling has started back up in her ankles. It does go down overnight. She is not able to wear support stockings. She does try to sit with her legs elevated somewhat while she cross stitches. She remains on 40 mg of Lasix. She has seen her PCP since discharge - had all her labs checked. Remains on coumadin. Clearly gets too much salt - she loves fast food - just ate at [employer], loves [place] and asking about the mozzarella sticks at [employer]. Her breathing is ok. No chest pain. Not dizzy. She is planning on keeping all her care here in [place] now.    Past Medical History:  Diagnosis Date  . Anemia   . Aortic insufficiency   . Chest pain   . Chronic diarrhea   . DNR (do not resuscitate)   . Falling   .  History of GI bleed   . History of shingles   . Long term current use of anticoagulant   . Mitral regurgitation   . Orthostatic hypotension   . Pacemaker   . Persistent atrial fibrillation    on Coumadin  . Pleural effusion   . Sinus node dysfunction (HCC)    a. s/p Biotronik PPM.  . Stroke (HCC)   .  Tricuspid regurgitation     Past Surgical History:  Procedure Laterality Date  . APPENDECTOMY    . CHOLECYSTECTOMY     with partial colectomy do to gallbadder infection   . IR THORACENTESIS ASP PLEURAL SPACE W/IMG GUIDE  06/22/2018  . IR THORACENTESIS ASP PLEURAL SPACE W/IMG GUIDE  09/29/2018     Medications: Current Meds  Medication Sig  . acetaminophen (TYLENOL) 325 MG tablet Take 650 mg by mouth every 4 (four) hours as needed for mild pain or fever (or if temp is 99.0 or greater for 72 hours.).  Marland Kitchen. alendronate (FOSAMAX) 70 MG tablet Take 70 mg by mouth once a week. Take with a full glass of water on an empty stomach.  . diltiazem (CARDIZEM CD) 180 MG 24 hr capsule Take 1 capsule (180 mg total) by mouth daily.  . diphenoxylate-atropine (LOMOTIL) 2.5-0.025 MG tablet Take 1 tablet by mouth 4 (four) times daily as needed for diarrhea or loose stools. (Patient taking differently: Take 2 tablets by mouth 4 (four) times daily as needed for diarrhea or loose stools. )  . feeding supplement (BOOST HIGH PROTEIN) LIQD Take 1 Container by mouth as needed (gain weght).  . furosemide (LASIX) 40 MG tablet Take 1 tablet (40 mg total) by mouth daily.  . hydroxypropyl methylcellulose / hypromellose (ISOPTO TEARS / GONIOVISC) 2.5 % ophthalmic solution Place 1 drop into both eyes 3 (three) times daily as needed for dry eyes.  Marland Kitchen. loperamide (IMODIUM) 2 MG capsule Take 4 mg by mouth daily.  . Menthol, Topical Analgesic, (BIOFREEZE EX) Apply 1 application topically daily as needed (pain).  . metoprolol tartrate (LOPRESSOR) 25 MG tablet Take 1 tablet (25 mg total) by mouth 2 (two) times daily.  Marland Kitchen. warfarin (COUMADIN) 2.5 MG tablet Take 2.5 mg by mouth See admin instructions. Take 1 table for two days then skip a day and repeat.  . [DISCONTINUED] Multiple Vitamin (MULTIVITAMIN WITH MINERALS) TABS tablet Take 1 tablet by mouth daily.     Allergies: Allergies  Allergen Reactions  . Codeine Other (See Comments)      unknown  . Enbrel [Etanercept] Other (See Comments)    unknown  . Fosamax [Alendronate Sodium] Other (See Comments)    unknown  . Hydromorphone Other (See Comments)    unknown  . Motrin [Ibuprofen] Other (See Comments)    Due to blood thinner   . Sulfa Antibiotics Other (See Comments)    Childhood allergy   . Voltaren [Diclofenac] Other (See Comments)    unknown  . Naproxen Sodium Palpitations    Social History: The patient  reports that she quit smoking about 13 years ago. She has never used smokeless tobacco. She reports that she does not drink alcohol or use drugs.   Family History: The patient's family history includes Arrhythmia in her daughter; Cancer in her brother; Heart attack in her father; Hypertension in her father; Stroke in her mother.   Review of Systems: Please see the history of present illness.   Otherwise, the review of systems is positive for none.   All other systems are  reviewed and negative.   Physical Exam: VS:  BP 130/80 (BP Location: Left Arm, Patient Position: Sitting, Cuff Size: Normal)   Pulse 78   Ht 5\' 5"  (1.651 m)   Wt 111 lb 12.8 oz (50.7 kg)   SpO2 99% Comment: at rest  BMI 18.60 kg/m  .  BMI Body mass index is 18.6 kg/m.  Wt Readings from Last 3 Encounters:  10/12/18 111 lb 12.8 oz (50.7 kg)  10/01/18 102 lb 12.8 oz (46.6 kg)  06/20/18 112 lb 10.5 oz (51.1 kg)    General: Elderly. Alert and in no acute distress. She is in a wheelchair. Somewhat frail. Quite thin.  She weighed 120# at her last visit here in 2015.  HEENT: Normal.  Neck: Supple, no JVD, carotid bruits, or masses noted.  Cardiac: Irregular rhythm. Her rate is ok. Heart tones are distant. Trace ankle edema.  Respiratory:  Lungs are fairly clear to auscultation bilaterally with normal work of breathing. She is moving air pretty well bilaterally.  GI: Soft and nontender.  MS: No deformity or atrophy. Gait not tested Skin: Warm and dry. Color is normal.  Neuro:  Strength  and sensation are intact and no gross focal deficits noted.  Psych: Alert, appropriate and with normal affect.   LABORATORY DATA:  EKG:  EKG is not ordered today.   Lab Results  Component Value Date   WBC 7.2 10/01/2018   HGB 11.2 (L) 10/01/2018   HCT 35.2 (L) 10/01/2018   PLT 199 10/01/2018   GLUCOSE 102 (H) 10/01/2018   ALT 12 06/23/2018   AST 16 06/23/2018   NA 139 10/01/2018   K 3.5 10/01/2018   CL 99 10/01/2018   CREATININE 0.97 10/01/2018   BUN 20 10/01/2018   CO2 31 10/01/2018   TSH 1.826 09/28/2018   INR 2.35 10/01/2018     BNP (last 3 results) Recent Labs    06/20/18 0945 09/28/18 0450  BNP 782.3* 828.6*    ProBNP (last 3 results) No results for input(s): PROBNP in the last 8760 hours.   Other Studies Reviewed Today:  Echo Study Conclusions 09/2018  - Left ventricle: The cavity size was normal. Wall thickness was   normal. Systolic function was normal. The estimated ejection   fraction was in the range of 50% to 55%. Wall motion was normal;   there were no regional wall motion abnormalities. The study is   not technically sufficient to allow evaluation of LV diastolic   function. - Ventricular septum: Septal motion showed abnormal function and   dyssynergy. - Aortic valve: Valve mobility was restricted. There was mild   regurgitation. - Mitral valve: Calcified annulus. There was mild regurgitation. - Left atrium: The atrium was moderately dilated. - Right ventricle: The cavity size was moderately dilated. - Right atrium: The atrium was severely dilated. - Tricuspid valve: There was moderate regurgitation. - Pulmonary arteries: Systolic pressure was mildly increased. PA   peak pressure: 47 mm Hg (S).  Impressions:  - Low normal LV systolic function; calcified aortic valve with mild   AI; mild MR; biatrial enlargement; moderate RVE; moderate TR with   mild pulmonary hypertension.   Assessment/Plan:  1.Recurrent bilateral pleural  effusions, R>L, - this has been attributed to diastolic dysfunction - but no real measure of diastolic dysfunction on her most recent echo noted - she has required thoracentesis x 2 - to consider pulmonary referral if this recurs - I think she will need serial CXR's - ?even pleurex  if the need for thoracentesis persists and becomes more frequent. Will plan for CXR in about 2 weeks. Her labs were just checked by PCP. Salt restriction is imperative - discussed at length - she is not able to wear support stockings.   2. Weight loss - to follow with PCP - but does not appear to be far off considering her weight from 2015. She is trying to supplement her diet with more protein.   3. Valvular heart disease with mild AI/mild MR/mod TR - stable when compared to prior.   4. Persistent AF - no longer on amiodarone - her rate is ok.   5. Underlying PPM - to see EP next week to get established  6. Chronic anticoagulation - has had prior discussion about changing to NOAC - for now she wishes to stay with coumadin. This is checked by her PCP  7. Severe hypokalemia/hypomagnesemia - followed by Enid Baas - has had labs with PCP that are reportedly ok.   8. Chronic anemia - noted.   9. Advanced age.   Current medicines are reviewed with the patient today.  The patient does not have concerns regarding medicines other than what has been noted above.  The following changes have been made:  See above.  Labs/ tests ordered today include:    Orders Placed This Encounter  Procedures  . DG Chest 2 View     Disposition:   FU with Dr. Johney Frame next week. See Dr. Anne Fu in about 6 weeks. I am happy to see back as needed. They understand that pulmonary referral will be placed if we have recurrent effusion requiring thoracentesis.   Patient is agreeable to this plan and will call if any problems develop in the interim.   SignedNorma Fredrickson, NP  10/12/2018 2:35 PM  [facility] [address] [place], [place]  [address] Phone: [phone] Fax: [phone]

## 2018-10-20 ENCOUNTER — Encounter: Payer: Self-pay | Admitting: Internal Medicine

## 2018-10-20 ENCOUNTER — Ambulatory Visit (INDEPENDENT_AMBULATORY_CARE_PROVIDER_SITE_OTHER): Payer: Medicare Other | Admitting: Internal Medicine

## 2018-10-20 VITALS — BP 104/60 | HR 65 | Ht 65.0 in | Wt 114.8 lb

## 2018-10-20 DIAGNOSIS — I4821 Permanent atrial fibrillation: Secondary | ICD-10-CM

## 2018-10-20 DIAGNOSIS — R001 Bradycardia, unspecified: Secondary | ICD-10-CM

## 2018-10-20 DIAGNOSIS — Z950 Presence of cardiac pacemaker: Secondary | ICD-10-CM

## 2018-10-20 DIAGNOSIS — Z7901 Long term (current) use of anticoagulants: Secondary | ICD-10-CM | POA: Diagnosis not present

## 2018-10-20 NOTE — Progress Notes (Signed)
Kaleen MaskElkins, Wilson Oliver, MD: Primary Cardiologist:  Dr Christen BameSkains  Carol Snyder is a 84 y.o. female with a h/o afib and sinus bradycardia sp PPM (BTK) by Dr Rudolpho SevinAkbary who presents today to establish care in the Electrophysiology device clinic.   The patient has had progressive decline over the past few years.  She has had falls and difficulty with nutrition/ failure to thrive.  Her afib has progressed to permanent, after failing amiodarone.  She is anticoagulated with coumadin. Today, she  denies symptoms of palpitations, chest pain, shortness of breath, orthopnea, PND,  dizziness, presyncope, syncope, or neurologic sequela.  + edema.  Has had orthostasis previously, now improved.  The patientis tolerating medications without difficulties and is otherwise without complaint today.   Past Medical History:  Diagnosis Date  . Anemia   . Aortic insufficiency   . Chest pain   . Chronic diarrhea   . DNR (do not resuscitate)   . Falling   . History of GI bleed   . History of shingles   . Long term current use of anticoagulant   . Mitral regurgitation   . Orthostatic hypotension   . Pacemaker   . Persistent atrial fibrillation    on Coumadin  . Pleural effusion   . Sinus node dysfunction (HCC)    a. s/p Biotronik PPM.  . Stroke (HCC)   . Tricuspid regurgitation    Past Surgical History:  Procedure Laterality Date  . APPENDECTOMY    . CHOLECYSTECTOMY     with partial colectomy do to gallbadder infection   . IR THORACENTESIS ASP PLEURAL SPACE W/IMG GUIDE  06/22/2018  . IR THORACENTESIS ASP PLEURAL SPACE W/IMG GUIDE  09/29/2018    Social History   Socioeconomic History  . Marital status: Single    Spouse name: Not on file  . Number of children: Not on file  . Years of education: Not on file  . Highest education level: Not on file  Occupational History  . Not on file  Social Needs  . Financial resource strain: Not on file  . Food insecurity:    Worry: Not on file    Inability: Not on  file  . Transportation needs:    Medical: Not on file    Non-medical: Not on file  Tobacco Use  . Smoking status: Former Smoker    Last attempt to quit: 11/17/2004    Years since quitting: 13.9  . Smokeless tobacco: Never Used  Substance and Sexual Activity  . Alcohol use: No  . Drug use: No  . Sexual activity: Not on file  Lifestyle  . Physical activity:    Days per week: Not on file    Minutes per session: Not on file  . Stress: Not on file  Relationships  . Social connections:    Talks on phone: Not on file    Gets together: Not on file    Attends religious service: Not on file    Active member of club or organization: Not on file    Attends meetings of clubs or organizations: Not on file    Relationship status: Not on file  . Intimate partner violence:    Fear of current or ex partner: Not on file    Emotionally abused: Not on file    Physically abused: Not on file    Forced sexual activity: Not on file  Other Topics Concern  . Not on file  Social History Narrative  . Not on file  Family History  Problem Relation Age of Onset  . Heart attack Father   . Hypertension Father   . Stroke Mother   . Cancer Brother        lung  . Arrhythmia Daughter        heart palpitations    Allergies  Allergen Reactions  . Codeine Other (See Comments)    unknown  . Enbrel [Etanercept] Other (See Comments)    unknown  . Fosamax [Alendronate Sodium] Other (See Comments)    unknown  . Hydromorphone Other (See Comments)    unknown  . Motrin [Ibuprofen] Other (See Comments)    Due to blood thinner   . Sulfa Antibiotics Other (See Comments)    Childhood allergy   . Voltaren [Diclofenac] Other (See Comments)    unknown  . Naproxen Sodium Palpitations    Current Outpatient Medications  Medication Sig Dispense Refill  . acetaminophen (TYLENOL) 325 MG tablet Take 650 mg by mouth every 4 (four) hours as needed for mild pain or fever (or if temp is 99.0 or greater for 72  hours.).    Marland Kitchen alendronate (FOSAMAX) 70 MG tablet Take 70 mg by mouth once a week. Take with a full glass of water on an empty stomach.    . diltiazem (CARDIZEM CD) 180 MG 24 hr capsule Take 1 capsule (180 mg total) by mouth daily. 30 capsule 0  . diphenoxylate-atropine (LOMOTIL) 2.5-0.025 MG tablet Take 1 tablet by mouth 4 (four) times daily as needed for diarrhea or loose stools. (Patient taking differently: Take 2 tablets by mouth 4 (four) times daily as needed for diarrhea or loose stools. )    . feeding supplement (BOOST HIGH PROTEIN) LIQD Take 1 Container by mouth as needed (gain weght).    . furosemide (LASIX) 40 MG tablet Take 1 tablet (40 mg total) by mouth daily. 30 tablet 0  . hydroxypropyl methylcellulose / hypromellose (ISOPTO TEARS / GONIOVISC) 2.5 % ophthalmic solution Place 1 drop into both eyes 3 (three) times daily as needed for dry eyes.    Marland Kitchen loperamide (IMODIUM) 2 MG capsule Take 4 mg by mouth daily.    . Menthol, Topical Analgesic, (BIOFREEZE EX) Apply 1 application topically daily as needed (pain).    . metoprolol tartrate (LOPRESSOR) 25 MG tablet Take 1 tablet (25 mg total) by mouth 2 (two) times daily. 60 tablet 0  . warfarin (COUMADIN) 2.5 MG tablet Take 2.5 mg by mouth See admin instructions. Take 1 table for two days then skip a day and repeat.     No current facility-administered medications for this visit.     ROS- all systems are reviewed and negative except as per HPI  Physical Exam: Vitals:   10/20/18 1217  BP: 104/60  Pulse: 65  SpO2: 98%  Weight: 114 lb 12.8 oz (52.1 kg)  Height: 5\' 5"  (1.651 m)    GEN- The patient is elderly and fatiguedappearing, alert and oriented x 3 today.   Head- normocephalic, atraumatic Eyes-  Sclera clear, conjunctiva pink Ears- hearing intact Oropharynx- clear Neck- supple,  Lungs- decreased BS at bases, normal work of breathing Chest- pacemaker pocket is well healed Heart- irregular rate and rhythm  GI- soft, NT, ND, +  BS Extremities- no clubbing, cyanosis,+ edema MS- diffuse  atrophy Skin- no rash or lesion Psych- euthymic mood, full affect Neuro- strength and sensation are intact  Pacemaker interrogation- reviewed in detail today,  See PACEART report  ekg today reveals afib with demand V  pacing  Assessment and Plan:  1. Symptomatic bradycardia Normal pacemaker function See Pace Art report No changes today  2. Permanent afib chads2vasc score is at least 5.  She is on coumadin Rate controlled She has declined DOAC previously  3. Advanced age She is DNR  Conservative measures advised  Remote monitoring Return to see EP PA annually I will see when needed Follow-up with Dr Anne Fu as scheduled  Hillis Range MD, [facility] 10/20/2018 12:40 PM

## 2018-10-20 NOTE — Addendum Note (Signed)
Addended by: Oretha MilchSWANSON, Marlowe Cinquemani on: 10/20/2018 12:57 PM   Modules accepted: Orders

## 2018-10-20 NOTE — Patient Instructions (Signed)
Medication Instructions:  Your physician recommends that you continue on your current medications as directed. Please refer to the Current Medication list given to you today.  Labwork: None ordered.  Testing/Procedures: None ordered.  Follow-Up: Your physician recommends that you schedule a follow-up appointment in:   One Year with Carol Snyder  Remote monitoring is used to monitor your Pacemaker of ICD from home. This monitoring reduces the number of office visits required to check your device to one time per year. It allows us to keep an eye on the functioning of your device to ensure it is working properly. You are scheduled for a device check from home on 01/19/19. You may send your transmission at any time that day. If you have a wireless device, the transmission will be sent automatically. After your physician reviews your transmission, you will receive a postcard with your next transmission date.    Any Other Special Instructions Will Be Listed Below (If Applicable).     If you need a refill on your cardiac medications before your next appointment, please call your pharmacy.

## 2018-10-29 ENCOUNTER — Telehealth: Payer: Self-pay | Admitting: Cardiology

## 2018-10-29 ENCOUNTER — Other Ambulatory Visit: Payer: Self-pay | Admitting: Cardiology

## 2018-10-29 MED ORDER — FUROSEMIDE 40 MG PO TABS: 40.0000 mg | ORAL_TABLET | Freq: Every day | ORAL | 3 refills | Status: DC

## 2018-10-29 MED ORDER — FUROSEMIDE 40 MG PO TABS: 40.0000 mg | ORAL_TABLET | Freq: Two times a day (BID) | ORAL | 1 refills | Status: DC

## 2018-10-29 MED ORDER — POTASSIUM CHLORIDE CRYS ER 20 MEQ PO TBCR: 20.0000 meq | EXTENDED_RELEASE_TABLET | Freq: Every day | ORAL | 1 refills | Status: DC

## 2018-10-29 MED ORDER — METOPROLOL TARTRATE 25 MG PO TABS: 25.0000 mg | ORAL_TABLET | Freq: Two times a day (BID) | ORAL | 3 refills | Status: DC

## 2018-10-29 MED ORDER — DILTIAZEM HCL ER COATED BEADS 180 MG PO CP24: 180.0000 mg | ORAL_CAPSULE | Freq: Every day | ORAL | 3 refills | Status: DC

## 2018-10-29 NOTE — Telephone Encounter (Signed)
° ° ° ° °*  STAT* If patient is at the pharmacy, call can be transferred to refill team.   1. Which medications need to be refilled? (please list name of each medication and dose if known) furosemide (LASIX) 40 MG tablet, metoprolol tartrate (LOPRESSOR) 25 MG tablet, diltiazem (CARDIZEM CD) 180 MG 24 hr capsule  2. Which pharmacy/location (including street and city if local pharmacy) is medication to be sent to? [id number] - [place], [place] - [phone]  3. Do they need a 30 day or 90 day supply? 90

## 2018-10-29 NOTE — Telephone Encounter (Signed)
Double lasix to 40mg  PO BID and take K-Dur 20meq PO QD. Check BMET in one week. Donato SchultzMark Jazper Nikolai, MD

## 2018-10-29 NOTE — Telephone Encounter (Signed)
Pt's daughter notified. They will increase lasix to 40 mg BID now but daughter is wondering if pt could  take 80 mg once daily due to having to urinate frequently at night.   Daughter is taking pt to primary care (Dr. Jeannetta NapElkins) on 12/19 for coumadin check and wants to have BMP checked there.  She will have the results faxed to our office.  Daughter reports she does not need an order sent to primary care.   Daughter would like to know if pt has any fluid restrictions and how much fluid she should drink daily.  [facility] [place]

## 2018-10-29 NOTE — Telephone Encounter (Signed)
Spoke with pt's daughter Aram BeechamCynthia. She states that pt's lower extremities ankles are swollen. The right ankle is more swollen than the left. Pt's swelling is less when she get up in the AM, but goes up as the day progresses. Pt denies SOB, nor wt gain. According to daughter pt has been trying to eat more because she is very thin. She has gain 2 lbs in 2 to weeks.  Pt is taking 40 mg every morning.  Daughter states that pt is aware to limit the sodium intake, and to prep her feet up when up sitting. Pt's daughter is aware that this message will be send to MD for recommendations.

## 2018-10-29 NOTE — Telephone Encounter (Signed)
°  Phone [phone] Daughter, Aram BeechamCynthia  Pt c/o swelling: STAT is pt has developed SOB within 24 hours  1) How much weight have you gained and in what time span?   2) If swelling, where is the swelling located? LEGS  3) Are you currently taking a fluid pill? NO  4) Are you currently SOB? NO  5) Do you have a log of your daily weights (if so, list)? 114, 113, 110  6) Have you gained 3 pounds in a day or 5 pounds in a week? NO  7) Have you traveled recently? NO

## 2018-10-31 NOTE — Telephone Encounter (Signed)
OK. Try 80mg  lasix QD.  1.5L fluid restrictions Salt restrictions  Donato SchultzMark Araceli Coufal, MD

## 2018-11-01 MED ORDER — FUROSEMIDE 40 MG PO TABS: 80.0000 mg | ORAL_TABLET | Freq: Every day | ORAL | 3 refills | Status: DC

## 2018-11-01 MED ORDER — POTASSIUM CHLORIDE CRYS ER 20 MEQ PO TBCR: 20.0000 meq | EXTENDED_RELEASE_TABLET | Freq: Every day | ORAL | 3 refills | Status: DC

## 2018-11-01 NOTE — Telephone Encounter (Signed)
Spoke to patient's daughter Aram Beecham([name]) and informed her of Dr Anne FuSkains' recommendations.  She verbalized understanding.

## 2018-11-05 NOTE — Discharge Summary (Signed)
Physician Discharge Summary  Carol Snyder RUE:[id number] DOB: Sep 19, 1929 DOA: 09/28/2018  PCP: Kaleen Mask, MD  Admit date: 09/28/2018 Discharge date: 11/05/2018  Time spent: 31 minutes  Recommendations for Outpatient Follow-up:  1. PCP and cardiology team within 1 to 2 weeks of discharge. 2.  Discharge Diagnoses:  Principal Problem:   Acute on chronic systolic CHF (congestive heart failure) (HCC) Active Problems:   Atrial fibrillation, chronic   Chronic anticoagulation   Pleural effusion   Hypokalemia   Discharge Condition: Stable  Diet recommendation: Cardiac diet.  Filed Weights   09/29/18 0347 09/30/18 0443 10/01/18 0316  Weight: 47.3 kg 46.9 kg 46.6 kg   Hospital Course: Patient is an 84 year old female, with past medical history significant for persistent atrial fibrillation on warfarin, SSS status post PPM, IBS, HTN, GI bleed, CVA, orthostatic hypotension, chronic diastolic CHF, frequent falls, chronic diarrhea, hospitalized in July 2019 for falls due to orthostasis, acute on chronic diarrhea, acute kidney injury and then in August 2019 for bilateral pleural effusions requiring thoracentesis.  Patient was presented with worsening dyspnea, orthopnea and lower extremity edema without chest pain.    Patient was admitted for further assessment and management of acute on chronic diastolic CHF with recurrent pleural effusion.    Patient was adequately diuresed.  IR and cardiology teams were consulted to assist with patient's management.  Patient proceeded with ultrasound guided right thoracentesis.  Patient has improved significantly, and will be discharged to the care of the primary care provider and cardiology team.   Acute on chronic diastolic CHF:  As per family, patient does not check weight daily, compliant with low-salt diet, on Lasix 20 mg daily (advised by cardiology to take 20 mg 3 times a week and by PCP 20 mg daily).   Cardiology was consulted to assist with  patient's management.   Patient was managed with IV Lasix, and gradually transition to oral diuretics.   Right pleural effusion: Pleural effusion is greater on the right side.  Suspect due to decompensated CHF.   This is patient's second thoracentesis in 3 months.   Prior effusion was transudative by light's criteria and negative for malignancy.   Status post ultrasound guided thoracentesis by IR yielding 1.2 L on 09/29/2018.  Persistent A. fib:  Continued diltiazem and metoprolol.   Cardiology team has discontinued amiodarone.   Coumadin per pharmacy. Optimize INR.  Essential hypertension:  Controlled. Continue to optimize.  Severe protein calorie malnutrition/weight loss:  Significant weight loss reported. Consult dietitian Caloric count.  Adult failure to thrive:  Multifactorial secondary to advanced age and multiple severe significant comorbidities. 09/30/2018: Guarded prognosis.  Status post PPM: Cardiology to interrogate device.  Valvular heart disease (mild to moderate AI/MR, moderate TR):  As per cardiology.   Echo revealed EF of 50 to 55%.  Diastolic function could not be evaluated.  Restriction of the aortic valve mobility was reported.  Mild aortic regurgitation reported.  Mild mitral regurgitation and moderate tricuspid regurgitation reported.  PA peak pressure was 47 mmHg.  Hypokalemia and hypomagnesemia:  Continue to monitor and replete.   Chronic diarrhea/IBS:  Unclear etiology.   Follow up on outpatient basis with [facility].     Normocytic anemia:  Likely anemia of chronic disease.  Periodically follow CBCs.    Procedures: Ultrasound guided right thoracentesis.  1.2 L of clear yellow fluid drained.    Consultations:  Cardiology  Discharge Exam: Vitals:   10/01/18 0834 10/01/18 1126  BP: 134/72 128/63  Pulse: 80  77  Resp:  18  Temp:  (!) 97.4 F (36.3 C)  SpO2: 98% 99%    General: Cachectic.  Chronically ill  looking. Cardiovascular: S1-S2. Respiratory: Decreased air entry globally  Discharge Instructions   Discharge Instructions    Diet - low sodium heart healthy   Complete by:  As directed    Increase activity slowly   Complete by:  As directed      Allergies as of 10/01/2018      Reactions   Codeine Other (See Comments)   unknown   Enbrel [etanercept] Other (See Comments)   unknown   Fosamax [alendronate Sodium] Other (See Comments)   unknown   Hydromorphone Other (See Comments)   unknown   Motrin [ibuprofen] Other (See Comments)   Due to blood thinner    Sulfa Antibiotics Other (See Comments)   Childhood allergy    Voltaren [diclofenac] Other (See Comments)   unknown   Naproxen Sodium Palpitations      Medication List    STOP taking these medications   amiodarone 100 MG tablet Commonly known as:  PACERONE   diltiazem 180 MG 24 hr capsule Commonly known as:  TIAZAC   doxycycline 100 MG tablet Commonly known as:  ADOXA   furosemide 20 MG tablet Commonly known as:  LASIX   metoprolol tartrate 50 MG tablet Commonly known as:  LOPRESSOR     TAKE these medications   acetaminophen 325 MG tablet Commonly known as:  TYLENOL Take 650 mg by mouth every 4 (four) hours as needed for mild pain or fever (or if temp is 99.0 or greater for 72 hours.).   alendronate 70 MG tablet Commonly known as:  FOSAMAX Take 70 mg by mouth once a week. Take with a full glass of water on an empty stomach.   BIOFREEZE EX Apply 1 application topically daily as needed (pain).   diphenoxylate-atropine 2.5-0.025 MG tablet Commonly known as:  LOMOTIL Take 1 tablet by mouth 4 (four) times daily as needed for diarrhea or loose stools. What changed:  how much to take   hydroxypropyl methylcellulose / hypromellose 2.5 % ophthalmic solution Commonly known as:  ISOPTO TEARS / GONIOVISC Place 1 drop into both eyes 3 (three) times daily as needed for dry eyes.   loperamide 2 MG  capsule Commonly known as:  IMODIUM Take 4 mg by mouth daily.   warfarin 2.5 MG tablet Commonly known as:  COUMADIN Take 2.5 mg by mouth See admin instructions. Take 1 table for two days then skip a day and repeat.      Allergies  Allergen Reactions  . Codeine Other (See Comments)    unknown  . Enbrel [Etanercept] Other (See Comments)    unknown  . Fosamax [Alendronate Sodium] Other (See Comments)    unknown  . Hydromorphone Other (See Comments)    unknown  . Motrin [Ibuprofen] Other (See Comments)    Due to blood thinner   . Sulfa Antibiotics Other (See Comments)    Childhood allergy   . Voltaren [Diclofenac] Other (See Comments)    unknown  . Naproxen Sodium Palpitations   Follow-up Information    Kaleen MaskElkins, Wilson Oliver, MD. Go on 10/06/2018.   Specialty:  Family Medicine Why:  As soon as posssible, your MD will decide at this appointment if he wants you to have home health.  Contact information: [address] [place] [id number] [phone]        Rosalio MacadamiaGerhardt, Lori C, NP.  Specialties:  Nurse Practitioner, Interventional Cardiology, Cardiology, Radiology Why:  [facility] - 10/12/18 at 2pm. Please arrive 15 minutes before to check in. Lawson FiscalLori is one of our nurse practitioners that works closely with Dr. Anne FuSkains and his care team. The office will also be calling you to set up pacemaker follow-up. Contact information: [address] [place] [id number] [phone]        Kaleen MaskElkins, Wilson Oliver, MD. Go on 10/06/2018.   Specialty:  Family Medicine Why:  @10 :Azzie [email or web address] Contact information: [address] [place] [address] [phone]            The results of significant diagnostics from this hospitalization (including imaging, microbiology, ancillary and laboratory) are listed below for reference.    Significant Diagnostic Studies: No results found.  Microbiology: No results found for this or any previous visit (from  the past 240 hour(s)).   Labs: Basic Metabolic Panel: No results for input(s): NA, K, CL, CO2, GLUCOSE, BUN, CREATININE, CALCIUM, MG, PHOS in the last 168 hours. Liver Function Tests: No results for input(s): AST, ALT, ALKPHOS, BILITOT, PROT, ALBUMIN in the last 168 hours. No results for input(s): LIPASE, AMYLASE in the last 168 hours. No results for input(s): AMMONIA in the last 168 hours. CBC: No results for input(s): WBC, NEUTROABS, HGB, HCT, MCV, PLT in the last 168 hours. Cardiac Enzymes: No results for input(s): CKTOTAL, CKMB, CKMBINDEX, TROPONINI in the last 168 hours. BNP: BNP (last 3 results) Recent Labs    06/20/18 0945 09/28/18 0450  BNP 782.3* 828.6*    ProBNP (last 3 results) No results for input(s): PROBNP in the last 8760 hours.  CBG: No results for input(s): GLUCAP in the last 168 hours.     Signed:  Barnetta ChapelSylvester I Patte Winkel MD.  [facility] 11/05/2018, 10:08 AM

## 2018-11-25 ENCOUNTER — Telehealth: Payer: Self-pay | Admitting: Cardiology

## 2018-11-25 NOTE — Telephone Encounter (Signed)
Spoke with patient's daughter, Carol Snyder [facility]), who called to ask if patient should continue Furosemide 80 mg daily which I confirmed from advice from Dr. Anne Fu on 12/15. I also reviewed Dr. Anne Fu' advice to also limit fluid intake to 1.5 L and to limit sodium intake. She states patient has occasional midsternal chest pain with cold sweats that resolves after a few minutes on its own. She states EMS was called after one episode and by the time they arrived the patient felt fine and was not transported to the hospital; ECG at that time was reported normal. Daughter states patient has had total of 2-3 episodes total. I asked about patient's activity at the time of those events and she reports patient under recent family stress and that she will monitor if future events occur r/t food intake, exertion, or stress. She denies past hx of cardiac cath and is not certain that they would want patient to undergo an invasive procedure. I advised her to continue to monitor and to call with worsening symptoms prior to appointment on 2/4 with Dr. Anne Fu. Carol Snyder verbalized understanding and agreement with plan and thanked me for the call.

## 2018-11-25 NOTE — Telephone Encounter (Signed)
New Message         Patient's daughter called today to see if her mother is suppose to stay on the 80mg  fluid pill or go back to the 40 mg fluid as of now there is no more swelling. Pls call and advise.

## 2018-11-26 NOTE — Telephone Encounter (Signed)
Lets keep the Lasix 80 mg once a day going.  Agree with current management of chest pain. At next clinic appointment we will check basic metabolic profile Donato Schultz, MD

## 2018-12-06 NOTE — Telephone Encounter (Signed)
Spoke with daughter who reports pt is feeling much better.  They will continue medications as instructed.  Daughter will c/b if any further needs or concerns.

## 2018-12-08 LAB — CUP PACEART INCLINIC DEVICE CHECK
Date Time Interrogation Session: 20191204170800
Implantable Lead Implant Date: 20170719
Implantable Lead Implant Date: 20170719
Implantable Lead Location: 753860
Implantable Lead Model: 377
Implantable Lead Serial Number: 49475245
Implantable Pulse Generator Implant Date: 20170719
Lead Channel Impedance Value: 292 Ohm
Lead Channel Impedance Value: 526 Ohm
Lead Channel Pacing Threshold Amplitude: 0.9 V
Lead Channel Pacing Threshold Pulse Width: 0.4 ms
Lead Channel Sensing Intrinsic Amplitude: 14.3 mV
MDC IDC LEAD LOCATION: 753859
MDC IDC LEAD SERIAL: 49500032
MDC IDC PG SERIAL: 68814684
MDC IDC SET LEADCHNL RV PACING AMPLITUDE: 2.4 V
MDC IDC SET LEADCHNL RV PACING PULSEWIDTH: 0.4 ms
MDC IDC STAT BRADY RV PERCENT PACED: 88 %

## 2018-12-16 ENCOUNTER — Ambulatory Visit: Payer: Medicare Other | Admitting: Cardiology

## 2018-12-21 ENCOUNTER — Ambulatory Visit: Payer: Medicare Other | Admitting: Cardiology

## 2018-12-21 ENCOUNTER — Encounter: Payer: Self-pay | Admitting: Cardiology

## 2018-12-21 VITALS — BP 116/60 | HR 77 | Ht 65.0 in | Wt 109.8 lb

## 2018-12-21 DIAGNOSIS — I509 Heart failure, unspecified: Secondary | ICD-10-CM | POA: Diagnosis not present

## 2018-12-21 DIAGNOSIS — I38 Endocarditis, valve unspecified: Secondary | ICD-10-CM

## 2018-12-21 DIAGNOSIS — I4821 Permanent atrial fibrillation: Secondary | ICD-10-CM | POA: Diagnosis not present

## 2018-12-21 DIAGNOSIS — Z79899 Other long term (current) drug therapy: Secondary | ICD-10-CM | POA: Diagnosis not present

## 2018-12-21 DIAGNOSIS — Z950 Presence of cardiac pacemaker: Secondary | ICD-10-CM

## 2018-12-21 DIAGNOSIS — Z7901 Long term (current) use of anticoagulants: Secondary | ICD-10-CM

## 2018-12-21 NOTE — Patient Instructions (Signed)
Medication Instructions:  The current medical regimen is effective;  continue present plan and medications.  If you need a refill on your cardiac medications before your next appointment, please call your pharmacy.   Lab work: Please have blood work today (BMP) If you have labs (blood work) drawn today and your tests are completely normal, you will receive your results only by: Marland Kitchen MyChart Message (if you have MyChart) OR . A paper copy in the mail If you have any lab test that is abnormal or we need to change your treatment, we will call you to review the results.  Follow-Up: At [facility], you and your health needs are our priority.  As part of our continuing mission to provide you with exceptional heart care, we have created designated Provider Care Teams.  These Care Teams include your primary Cardiologist (physician) and Advanced Practice Providers (APPs -  Physician Assistants and Nurse Practitioners) who all work together to provide you with the care you need, when you need it. You will need a follow up appointment in 3 months with Norma Fredrickson, NP and Dr Anne Fu in 6 months.  Please call our office 2 months in advance to schedule this appointment.  You may see Donato Schultz, MD or one of the following Advanced Practice Providers on your designated Care Team:   Norma Fredrickson, NP Nada Boozer, NP . Georgie Chard, NP  Thank you for choosing [facility]!!

## 2018-12-21 NOTE — Progress Notes (Signed)
Cardiology Office Note:    Date:  12/21/2018   ID:  Carol Snyder, DOB Aug 17, 1929, MRN [id number]  PCP:  Carol Mask, MD  Cardiologist:  Carol Schultz, MD  Electrophysiologist:  None   Referring MD: Carol Snyder, *     History of Present Illness:    Carol Snyder is a 85 y.o. female here for follow-up post hospitalization bilateral pleural effusion acute diastolic heart failure.  She had thoracentesis August 2019 as well as 09/2018.  No malignancy translated.  Used to be on Lasix 20 mg a day at home.  Changed to 40 mg a day and after she started having more weight gain, fluid gain this was increased to 80 mg daily.  She also has persistent atrial fibrillation with sinus node dysfunction and Biotronik pacemaker.  Amiodarone had previously been stopped.  Permanent.  Currently feels well.  Denies any trouble breathing.  Still working on her GI system she states.  No chest pain.  Feels better on the Lasix 80 mg once a day.  Past Medical History:  Diagnosis Date  . Anemia   . Aortic insufficiency   . Chest pain   . Chronic diarrhea   . DNR (do not resuscitate)   . Falling   . History of GI bleed   . History of shingles   . Long term current use of anticoagulant   . Mitral regurgitation   . Orthostatic hypotension   . Persistent atrial fibrillation    on Coumadin  . Pleural effusion   . Sinus node dysfunction (HCC)    a. s/p Biotronik PPM.  . Stroke (HCC)   . Tricuspid regurgitation     Past Surgical History:  Procedure Laterality Date  . APPENDECTOMY    . CHOLECYSTECTOMY     with partial colectomy do to gallbadder infection   . IR THORACENTESIS ASP PLEURAL SPACE W/IMG GUIDE  06/22/2018  . IR THORACENTESIS ASP PLEURAL SPACE W/IMG GUIDE  09/29/2018  . PACEMAKER IMPLANT  06/04/2016   BTK [place] 8 DR-T implanted by Dr Rudolpho Sevin at [facility] for sick sinus syndrome    Current Medications: Current Meds  Medication Sig  . acetaminophen (TYLENOL) 325 MG  tablet Take 650 mg by mouth every 4 (four) hours as needed for mild pain or fever (or if temp is 99.0 or greater for 72 hours.).  Marland Kitchen alendronate (FOSAMAX) 70 MG tablet Take 70 mg by mouth once a week. Take with a full glass of water on an empty stomach.  . diltiazem (CARDIZEM CD) 180 MG 24 hr capsule Take 1 capsule (180 mg total) by mouth daily.  . diphenoxylate-atropine (LOMOTIL) 2.5-0.025 MG tablet Take 1 tablet by mouth 4 (four) times daily as needed for diarrhea or loose stools.  . feeding supplement (BOOST HIGH PROTEIN) LIQD Take 1 Container by mouth as needed (gain weght).  . furosemide (LASIX) 40 MG tablet Take 2 tablets (80 mg total) by mouth daily.  . hydroxypropyl methylcellulose / hypromellose (ISOPTO TEARS / GONIOVISC) 2.5 % ophthalmic solution Place 1 drop into both eyes 3 (three) times daily as needed for dry eyes.  Marland Kitchen loperamide (IMODIUM) 2 MG capsule Take 4 mg by mouth daily.  . Menthol, Topical Analgesic, (BIOFREEZE EX) Apply 1 application topically daily as needed (pain).  . metoprolol tartrate (LOPRESSOR) 25 MG tablet Take 1 tablet (25 mg total) by mouth 2 (two) times daily.  . potassium chloride SA (K-DUR,KLOR-CON) 20 MEQ tablet Take 1 tablet (20 mEq  total) by mouth daily.  Marland Kitchen warfarin (COUMADIN) 2.5 MG tablet Take 2.5 mg by mouth See admin instructions. Take 1 table for two days then skip a day and repeat.     Allergies:   Codeine; Enbrel [etanercept]; Fosamax [alendronate sodium]; Hydromorphone; Motrin [ibuprofen]; Sulfa antibiotics; Voltaren [diclofenac]; and Naproxen sodium   Social History   Socioeconomic History  . Marital status: Single    Spouse name: Not on file  . Number of children: Not on file  . Years of education: Not on file  . Highest education level: Not on file  Occupational History  . Not on file  Social Needs  . Financial resource strain: Not on file  . Food insecurity:    Worry: Not on file    Inability: Not on file  . Transportation needs:     Medical: Not on file    Non-medical: Not on file  Tobacco Use  . Smoking status: Former Smoker    Last attempt to quit: 11/17/2004    Years since quitting: 14.1  . Smokeless tobacco: Never Used  Substance and Sexual Activity  . Alcohol use: No  . Drug use: No  . Sexual activity: Not on file  Lifestyle  . Physical activity:    Days per week: Not on file    Minutes per session: Not on file  . Stress: Not on file  Relationships  . Social connections:    Talks on phone: Not on file    Gets together: Not on file    Attends religious service: Not on file    Active member of club or organization: Not on file    Attends meetings of clubs or organizations: Not on file    Relationship status: Not on file  Other Topics Concern  . Not on file  Social History Narrative  . Not on file     Family History: The patient's family history includes Arrhythmia in her daughter; Cancer in her brother; Heart attack in her father; Hypertension in her father; Stroke in her mother.  ROS:   Please see the history of present illness.    No syncope bleeding orthopnea PND all other systems reviewed and are negative.  EKGs/Labs/Other Studies Reviewed:    The following studies were reviewed today: Prior hospital records echo EKG blood work  EKG: Ventricular pacing  Recent Labs: 06/23/2018: ALT 12 09/28/2018: B Natriuretic Peptide 828.6; TSH 1.826 10/01/2018: BUN 20; Creatinine, Ser 0.97; Hemoglobin 11.2; Magnesium 1.7; Platelets 199; Potassium 3.5; Sodium 139  Recent Lipid Panel No results found for: CHOL, TRIG, HDL, CHOLHDL, VLDL, LDLCALC, LDLDIRECT  Physical Exam:    VS:  BP 116/60   Pulse 77   Ht 5\' 5"  (1.651 m)   Wt 109 lb 12.8 oz (49.8 kg)   SpO2 99%   BMI 18.27 kg/m     Wt Readings from Last 3 Encounters:  12/21/18 109 lb 12.8 oz (49.8 kg)  10/20/18 114 lb 12.8 oz (52.1 kg)  10/12/18 111 lb 12.8 oz (50.7 kg)     GEN: Thin in no acute distress HEENT: Normal NECK: No JVD; No  carotid bruits LYMPHATICS: No lymphadenopathy CARDIAC: RRR, no murmurs, rubs, gallops RESPIRATORY:  Clear to auscultation without rales, wheezing or rhonchi, I do not hear any blunting at the bases. ABDOMEN: Soft, non-tender, non-distended MUSCULOSKELETAL:  No edema; No deformity  SKIN: Warm and dry NEUROLOGIC:  Alert and oriented x 3 PSYCHIATRIC:  Normal affect   ASSESSMENT:    1. Permanent atrial  fibrillation   2. Encounter for long-term current use of medication   3. Heart failure due to valvular disease, unspecified heart failure type (HCC)    PLAN:    In order of problems listed above:  Recurrent pleural effusions -Seems to be doing quite well with her Lasix 80 mg once a day.  Checking basic metabolic profile today.  Had 2 prior thoracenteses.  Trying to avoid further intervention.  Pacemaker -Functioning well, Biotronik.  Recent visit from Dr. Johney FrameAllred reviewed.  Atrial fibrillation permanent - Pacemaker for control.  Chronic anticoagulation -Coumadin.  Dr. Jeannetta NapElkins has been monitoring.  2.3 last.  Chronic diastolic heart failure - Continue to maintain with Lasix.  Salt restriction.  Fluid restriction.  Good overall weight currently.   Medication Adjustments/Labs and Tests Ordered: Current medicines are reviewed at length with the patient today.  Concerns regarding medicines are outlined above.  Orders Placed This Encounter  Procedures  . Basic metabolic panel   No orders of the defined types were placed in this encounter.   Patient Instructions  Medication Instructions:  The current medical regimen is effective;  continue present plan and medications.  If you need a refill on your cardiac medications before your next appointment, please call your pharmacy.   Lab work: Please have blood work today (BMP) If you have labs (blood work) drawn today and your tests are completely normal, you will receive your results only by: Marland Kitchen. MyChart Message (if you have MyChart)  OR . A paper copy in the mail If you have any lab test that is abnormal or we need to change your treatment, we will call you to review the results.  Follow-Up: [facility], you and your health needs are our priority.  As part of our continuing mission to provide you with exceptional heart care, we have created designated Provider Care Teams.  These Care Teams include your primary Cardiologist (physician) and Advanced Practice Providers (APPs -  Physician Assistants and Nurse Practitioners) who all work together to provide you with the care you need, when you need it. You will need a follow up appointment in 3 months with Norma FredricksonLori Gerhardt, NP and Dr Anne FuSkains in 6 months.  Please call our office 2 months in advance to schedule this appointment.  You may see Carol SchultzMark Phyllis Abelson, MD or one of the following Advanced Practice Providers on your designated Care Team:   Norma FredricksonLori Gerhardt, NP Nada BoozerLaura Ingold, NP . Georgie ChardJill McDaniel, NP  Thank you for choosing [facility]!!        Signed, Carol SchultzMark Kaynen Minner, MD  12/21/2018 2:20 PM    [facility]

## 2018-12-22 LAB — BASIC METABOLIC PANEL
BUN / CREAT RATIO: 32 — AB (ref 12–28)
BUN: 32 mg/dL — AB (ref 8–27)
CO2: 20 mmol/L (ref 20–29)
Calcium: 9.3 mg/dL (ref 8.7–10.3)
Chloride: 100 mmol/L (ref 96–106)
Creatinine, Ser: 1.01 mg/dL — ABNORMAL HIGH (ref 0.57–1.00)
GFR calc non Af Amer: 50 mL/min/{1.73_m2} — ABNORMAL LOW (ref >59)
GFR, EST AFRICAN AMERICAN: 58 mL/min/{1.73_m2} — AB (ref >59)
Glucose: 79 mg/dL (ref 65–99)
Potassium: 4 mmol/L (ref 3.5–5.2)
Sodium: 143 mmol/L (ref 134–144)

## 2019-01-19 ENCOUNTER — Ambulatory Visit (INDEPENDENT_AMBULATORY_CARE_PROVIDER_SITE_OTHER): Payer: Medicare Other | Admitting: *Deleted

## 2019-01-19 DIAGNOSIS — I4821 Permanent atrial fibrillation: Secondary | ICD-10-CM

## 2019-01-19 DIAGNOSIS — R001 Bradycardia, unspecified: Secondary | ICD-10-CM | POA: Diagnosis not present

## 2019-01-21 LAB — CUP PACEART REMOTE DEVICE CHECK
Implantable Lead Implant Date: 20170719
Implantable Lead Location: 753859
Implantable Lead Model: 377
Implantable Lead Serial Number: 49500032
Implantable Pulse Generator Implant Date: 20170719
MDC IDC LEAD IMPLANT DT: 20170719
MDC IDC LEAD LOCATION: 753860
MDC IDC LEAD SERIAL: 49475245
MDC IDC SESS DTM: 20200306050137
Pulse Gen Model: 407145
Pulse Gen Serial Number: 68814684

## 2019-01-26 ENCOUNTER — Encounter: Payer: Self-pay | Admitting: Cardiology

## 2019-01-26 NOTE — Progress Notes (Signed)
Remote pacemaker transmission.   

## 2019-03-22 ENCOUNTER — Telehealth: Payer: Medicare Other | Admitting: Nurse Practitioner

## 2019-03-23 ENCOUNTER — Telehealth: Payer: Self-pay | Admitting: *Deleted

## 2019-03-23 NOTE — Telephone Encounter (Signed)
Virtual Visit Pre-Appointment Phone Call  "(Name), I am calling you today to discuss your upcoming appointment. We are currently trying to limit exposure to the virus that causes COVID-19 by seeing patients at home rather than in the office."  1. "What is the BEST phone number to call the day of the visit?" - include this in appointment notes  2. "Do you have or have access to (through a family member/friend) a smartphone with video capability that we can use for your visit?" a. If yes - list this number in appt notes as "cell" (if different from BEST phone #) and list the appointment type as a VIDEO visit in appointment notes b. If no - list the appointment type as a PHONE visit in appointment notes  3. Confirm consent - "In the setting of the current Covid19 crisis, you are scheduled for a (phone or video) visit with your provider on (Monday, May 11) at (11:45 am ).  Just as we do with many in-office visits, in order for you to participate in this visit, we must obtain consent.  If you'd like, I can send this to your mychart (if signed up) or email for you to review.  Otherwise, I can obtain your verbal consent now.  All virtual visits are billed to your insurance company just like a normal visit would be.  By agreeing to a virtual visit, we'd like you to understand that the technology does not allow for your provider to perform an examination, and thus may limit your provider's ability to fully assess your condition. If your provider identifies any concerns that need to be evaluated in person, we will make arrangements to do so.  Finally, though the technology is pretty good, we cannot assure that it will always work on either your or our end, and in the setting of a video visit, we may have to convert it to a phone-only visit.  In either situation, we cannot ensure that we have a secure connection.  Are you willing to proceed?" STAFF: Did the patient verbally acknowledge consent to telehealth  visit? Document YES/NO here: YES  4. Advise patient to be prepared - "Two hours prior to your appointment, go ahead and check your blood pressure, pulse, oxygen saturation, and your weight (if you have the equipment to check those) and write them all down. When your visit starts, your provider will ask you for this information. If you have an Apple Watch or Kardia device, please plan to have heart rate information ready on the day of your appointment. Please have a pen and paper handy nearby the day of the visit as well."  5. Give patient instructions for MyChart download to smartphone OR Doximity/Doxy.me as below if video visit (depending on what platform provider is using)  6. Inform patient they will receive a phone call 15 minutes prior to their appointment time (may be from unknown caller ID) so they should be prepared to answer    TELEPHONE CALL NOTE  Carol Snyder has been deemed a candidate for a follow-up tele-health visit to limit community exposure during the Covid-19 pandemic. I spoke with the patient via phone to ensure availability of phone/video source, confirm preferred email & phone number, and discuss instructions and expectations.  I reminded Carol Snyder to be prepared with any vital sign and/or heart rhythm information that could potentially be obtained via home monitoring, at the time of her visit. I reminded Carol Snyder to expect a  phone call prior to her visit.  Seddrick Flax Leanord Asal 03/23/2019 9:24 AM   INSTRUCTIONS FOR DOWNLOADING THE MYCHART APP TO SMARTPHONE  - The patient must first make sure to have activated MyChart and know their login information - If Apple, go to [employer] and type in MyChart in the search bar and download the app. If Android, ask patient to go to [employer] and type in [place] in the search bar and download the app. The app is free but as with any other app downloads, their phone may require them to verify saved payment information or  Apple/Android password.  - The patient will need to then log into the app with their MyChart username and password, and select [facility] as their healthcare provider to link the account. When it is time for your visit, go to the MyChart app, find appointments, and click Begin Video Visit. Be sure to Select Allow for your device to access the Microphone and Camera for your visit. You will then be connected, and your provider will be with you shortly.  **If they have any issues connecting, or need assistance please contact MyChart service desk [phone])**  **If using a computer, in order to ensure the best quality for their visit they will need to use either of the following Internet Browsers: [employer] or Google Chrome**  IF USING DOXIMITY or DOXY.ME - The patient will receive a link just prior to their visit by text.     FULL LENGTH CONSENT FOR TELE-HEALTH VISIT   I hereby voluntarily request, consent and authorize [facility] and its employed or contracted physicians, physician assistants, nurse practitioners or other licensed health care professionals (the Practitioner), to provide me with telemedicine health care services (the "Services") as deemed necessary by the treating Practitioner. I acknowledge and consent to receive the Services by the Practitioner via telemedicine. I understand that the telemedicine visit will involve communicating with the Practitioner through live audiovisual communication technology and the disclosure of certain medical information by electronic transmission. I acknowledge that I have been given the opportunity to request an in-person assessment or other available alternative prior to the telemedicine visit and am voluntarily participating in the telemedicine visit.  I understand that I have the right to withhold or withdraw my consent to the use of telemedicine in the course of my care at any time, without affecting my right to future care  or treatment, and that the Practitioner or I may terminate the telemedicine visit at any time. I understand that I have the right to inspect all information obtained and/or recorded in the course of the telemedicine visit and may receive copies of available information for a reasonable fee.  I understand that some of the potential risks of receiving the Services via telemedicine include:  Marland Kitchen Delay or interruption in medical evaluation due to technological equipment failure or disruption; . Information transmitted may not be sufficient (e.g. poor resolution of images) to allow for appropriate medical decision making by the Practitioner; and/or  . In rare instances, security protocols could fail, causing a breach of personal health information.  Furthermore, I acknowledge that it is my responsibility to provide information about my medical history, conditions and care that is complete and accurate to the best of my ability. I acknowledge that Practitioner's advice, recommendations, and/or decision may be based on factors not within their control, such as incomplete or inaccurate data provided by me or distortions of diagnostic images or specimens that may result  from electronic transmissions. I understand that the practice of medicine is not an exact science and that Practitioner makes no warranties or guarantees regarding treatment outcomes. I acknowledge that I will receive a copy of this consent concurrently upon execution via email to the email address I last provided but may also request a printed copy by calling the office of [place].    I understand that my insurance will be billed for this visit.   I have read or had this consent read to me. . I understand the contents of this consent, which adequately explains the benefits and risks of the Services being provided via telemedicine.  . I have been provided ample opportunity to ask questions regarding this consent and the Services and have had  my questions answered to my satisfaction. . I give my informed consent for the services to be provided through the use of telemedicine in my medical care  By participating in this telemedicine visit I agree to the above.

## 2019-03-27 NOTE — Progress Notes (Signed)
Telehealth Visit     Virtual Visit via Telephone Note   This visit type was conducted due to national recommendations for restrictions regarding the COVID-19 Pandemic (e.g. social distancing) in an effort to limit this patient's exposure and mitigate transmission in our community.  Due to her co-morbid illnesses, this patient is at least at moderate risk for complications without adequate follow up.  This format is felt to be most appropriate for this patient at this time.  The patient did not have access to video technology/had technical difficulties with video requiring transitioning to audio format only (telephone).  All issues noted in this document were discussed and addressed.  No physical exam could be performed with this format.  Please refer to the patient's chart for her  consent to telehealth for [facility].   Evaluation Performed:  Follow-up visit  This visit type was conducted due to national recommendations for restrictions regarding the COVID-19 Pandemic (e.g. social distancing).  This format is felt to be most appropriate for this patient at this time.  All issues noted in this document were discussed and addressed.  No physical exam was performed (except for noted visual exam findings with Video Visits).  Please refer to the patient's chart (MyChart message for video visits and phone note for telephone visits) for the patient's consent to telehealth for [facility].  Date:  03/28/2019   ID:  Carol Snyder, DOB [birth year], MRN [id number]  Patient Location:  Home  Provider location:   Home  PCP:  Kaleen Mask, MD  Cardiologist:  Tyrone Sage & Donato Schultz, MD  Electrophysiologist:  Allred  Chief Complaint:  Follow up  History of Present Illness:    Carol Snyder is a 85 y.o. female who presents via audio/video conferencing for a telehealth visit today.  Seen for Dr. Anne Fu.   She has had recurrent bilateral pleural effusion and acute diastolic heart failure.  She  had thoracentesis August 2019 as well as 09/2018.  No malignancy found.  She has had to have escalating doses of Lasix. Other issues include persistent atrial fibrillation with sinus node dysfunction and underlying Biotronik pacemaker - followed by Dr. Johney Frame.  Was on amiodarone in the past - now rate controlled. She is on Warfarin.   Last seen by Dr. Anne Fu in February - felt to be doing ok.   The patient does not have symptoms concerning for COVID-19 infection (fever, chills, cough, or new shortness of breath).   Seen today via Doximity. Her daughter Carney Bern is present. She has consented for this visit. Daughter's phone is used - she is NOT on the DPR - form being mailed for her to complete. Carol Snyder has been staying at home - regardless of the COVID, says she has never been one to "just go out and look".  She has not been eating out. She has been cross stitching. Breathing is ok per her report. She says is losing weight but looks relatively stable. INR is checked by her PCP. No bleeding/excessive bruising noted. No falls. She feels like overall she is doing ok.   Past Medical History:  Diagnosis Date  . Anemia   . Aortic insufficiency   . Chest pain   . Chronic diarrhea   . DNR (do not resuscitate)   . Falling   . History of GI bleed   . History of shingles   . Long term current use of anticoagulant   . Mitral regurgitation   . Orthostatic hypotension   .  Persistent atrial fibrillation    on Coumadin  . Pleural effusion   . Sinus node dysfunction (HCC)    a. s/p Biotronik PPM.  . Stroke (HCC)   . Tricuspid regurgitation    Past Surgical History:  Procedure Laterality Date  . APPENDECTOMY    . CHOLECYSTECTOMY     with partial colectomy do to gallbadder infection   . IR THORACENTESIS ASP PLEURAL SPACE W/IMG GUIDE  06/22/2018  . IR THORACENTESIS ASP PLEURAL SPACE W/IMG GUIDE  09/29/2018  . PACEMAKER IMPLANT  06/04/2016   BTK Edora 8 DR-T implanted by Dr Rudolpho Sevin at [facility]  for sick sinus syndrome     Current Meds  Medication Sig  . acetaminophen (TYLENOL) 325 MG tablet Take 650 mg by mouth every 4 (four) hours as needed for mild pain or fever (or if temp is 99.0 or greater for 72 hours.).  Marland Kitchen alendronate (FOSAMAX) 70 MG tablet Take 70 mg by mouth once a week. Take with a full glass of water on an empty stomach.  . diltiazem (CARDIZEM CD) 180 MG 24 hr capsule Take 1 capsule (180 mg total) by mouth daily.  . diphenoxylate-atropine (LOMOTIL) 2.5-0.025 MG tablet Take 1 tablet by mouth 4 (four) times daily as needed for diarrhea or loose stools.  . furosemide (LASIX) 80 MG tablet Take 80 mg by mouth daily.   . hydroxypropyl methylcellulose / hypromellose (ISOPTO TEARS / GONIOVISC) 2.5 % ophthalmic solution Place 1 drop into both eyes 3 (three) times daily as needed for dry eyes.  Marland Kitchen loperamide (IMODIUM) 2 MG capsule Take 4 mg by mouth daily.  . Menthol, Topical Analgesic, (BIOFREEZE EX) Apply 1 application topically daily as needed (pain).  . metoprolol tartrate (LOPRESSOR) 25 MG tablet Take 1 tablet (25 mg total) by mouth 2 (two) times daily.  . potassium chloride SA (K-DUR,KLOR-CON) 20 MEQ tablet Take 1 tablet (20 mEq total) by mouth daily.  Marland Kitchen warfarin (COUMADIN) 2.5 MG tablet Take 2.5 mg by mouth See admin instructions. Take 1 table for two days then skip a day and repeat.  . [DISCONTINUED] feeding supplement (BOOST HIGH PROTEIN) LIQD Take 1 Container by mouth as needed (gain weght).     Allergies:   Codeine; Enbrel [etanercept]; Fosamax [alendronate sodium]; Hydromorphone; Motrin [ibuprofen]; Sulfa antibiotics; Voltaren [diclofenac]; and Naproxen sodium   Social History   Tobacco Use  . Smoking status: Former Smoker    Last attempt to quit: 11/17/2004    Years since quitting: 14.3  . Smokeless tobacco: Never Used  Substance Use Topics  . Alcohol use: No  . Drug use: No     Family Hx: The patient's family history includes Arrhythmia in her daughter; Cancer  in her brother; Heart attack in her father; Hypertension in her father; Stroke in her mother.  ROS:   Please see the history of present illness.   All other systems reviewed are negative.    Objective:    Vital Signs:  Ht  (1.676 m)   Wt 110 lb (49.9 kg)   BMI 17.75 kg/m    Wt Readings from Last 3 Encounters:  03/28/19 110 lb (49.9 kg)  12/21/18 109 lb 12.8 oz (49.8 kg)  10/20/18 114 lb 12.8 oz (52.1 kg)    Alert female in no acute distress. Does not sound short of breath with conversation.    Labs/Other Tests and Data Reviewed:    Lab Results  Component Value Date   WBC 7.2 10/01/2018   HGB  11.2 (L) 10/01/2018   HCT 35.2 (L) 10/01/2018   PLT 199 10/01/2018   GLUCOSE 79 12/21/2018   ALT 12 06/23/2018   AST 16 06/23/2018   NA 143 12/21/2018   K 4.0 12/21/2018   CL 100 12/21/2018   CREATININE 1.01 (H) 12/21/2018   BUN 32 (H) 12/21/2018   CO2 20 12/21/2018   TSH 1.826 09/28/2018   INR 2.35 10/01/2018     BNP (last 3 results) Recent Labs    06/20/18 0945 09/28/18 0450  BNP 782.3* 828.6*    ProBNP (last 3 results) No results for input(s): PROBNP in the last 8760 hours.    Prior CV studies:    The following studies were reviewed today:  Remote Device Check 01/2019 Notes recorded by Hillis Range, MD on 01/23/2019 at 5:32 PM EDT Remote device check reviewed. Normal device function. Battery status, leads stable. Histograms reviewed and appropriate.  Routine follow-up   Echo Study Conclusions 09/2018  - Left ventricle: The cavity size was normal. Wall thickness was   normal. Systolic function was normal. The estimated ejection   fraction was in the range of 50% to 55%. Wall motion was normal;   there were no regional wall motion abnormalities. The study is   not technically sufficient to allow evaluation of LV diastolic   function. - Ventricular septum: Septal motion showed abnormal function and   dyssynergy. - Aortic valve: Valve mobility was  restricted. There was mild   regurgitation. - Mitral valve: Calcified annulus. There was mild regurgitation. - Left atrium: The atrium was moderately dilated. - Right ventricle: The cavity size was moderately dilated. - Right atrium: The atrium was severely dilated. - Tricuspid valve: There was moderate regurgitation. - Pulmonary arteries: Systolic pressure was mildly increased. PA   peak pressure: 47 mm Hg (S).  Impressions:  - Low normal LV systolic function; calcified aortic valve with mild   AI; mild MR; biatrial enlargement; moderate RVE; moderate TR with   mild pulmonary hypertension.    ASSESSMENT & PLAN:    1.  Recurrent pleural effusions - she has had prior thoracenteses x 2 - remains on diuretic therapy as well - stable BMET in February noted - she notes her breathing is stable at this time.   2. Underlying PPM - follows with EP - to see in December 2020  3. Persistent AF - managed with rate control and anticoagulation - no overt issues noted.   4. Chronic diastolic heart failure - sounds to be doing ok. No changes made today.   5. Chronic anticoagulation - monitored by her PCP - she goes next week - no problems noted.   6.  COVID-19 Education: The signs and symptoms of COVID-19 were discussed with the patient and how to seek care for testing (follow up with PCP or arrange E-visit).  The importance of social distancing, staying at home, hand hygiene and wearing a mask when out in public were discussed today.  Patient Risk:   After full review of this patient's clinical status, I feel that they are at least moderate risk at this time.  Time:   Today, I have spent 8 minutes with the patient with telehealth technology discussing the above issues.     Medication Adjustments/Labs and Tests Ordered: Current medicines are reviewed at length with the patient today.  Concerns regarding medicines are outlined above.   Tests Ordered: No orders of the defined types were  placed in this encounter.   Medication Changes: No  orders of the defined types were placed in this encounter.   Disposition:  FU with Dr. Anne FuSkains per the recall in August.   Patient is agreeable to this plan and will call if any problems develop in the interim.   Avelina LaineSigned, Eleena Grater, NP  03/28/2019 12:05 PM    [facility]

## 2019-03-28 ENCOUNTER — Other Ambulatory Visit: Payer: Self-pay

## 2019-03-28 ENCOUNTER — Encounter: Payer: Self-pay | Admitting: Nurse Practitioner

## 2019-03-28 ENCOUNTER — Telehealth (INDEPENDENT_AMBULATORY_CARE_PROVIDER_SITE_OTHER): Payer: Medicare Other | Admitting: Nurse Practitioner

## 2019-03-28 VITALS — Ht 66.0 in | Wt 110.0 lb

## 2019-03-28 DIAGNOSIS — Z7189 Other specified counseling: Secondary | ICD-10-CM

## 2019-03-28 DIAGNOSIS — R001 Bradycardia, unspecified: Secondary | ICD-10-CM | POA: Diagnosis not present

## 2019-03-28 DIAGNOSIS — Z7901 Long term (current) use of anticoagulants: Secondary | ICD-10-CM

## 2019-03-28 DIAGNOSIS — Z950 Presence of cardiac pacemaker: Secondary | ICD-10-CM

## 2019-03-28 DIAGNOSIS — I38 Endocarditis, valve unspecified: Secondary | ICD-10-CM

## 2019-03-28 DIAGNOSIS — I4821 Permanent atrial fibrillation: Secondary | ICD-10-CM

## 2019-03-28 DIAGNOSIS — J90 Pleural effusion, not elsewhere classified: Secondary | ICD-10-CM

## 2019-03-28 NOTE — Patient Instructions (Addendum)
After Visit Summary:  We will be checking the following labs today - NONE   Medication Instructions:    Continue with your current medicines.    If you need a refill on your cardiac medications before your next appointment, please call your pharmacy.     Testing/Procedures To Be Arranged:  N/A  Follow-Up:   See Dr. Anne Fu in August per recall.     At [facility], you and your health needs are our priority.  As part of our continuing mission to provide you with exceptional heart care, we have created designated Provider Care Teams.  These Care Teams include your primary Cardiologist (physician) and Advanced Practice Providers (APPs -  Physician Assistants and Nurse Practitioners) who all work together to provide you with the care you need, when you need it.  Special Instructions:  . Stay safe, stay home, wash your hands for at least 20 seconds and wear a mask when out in public.  . It was good to talk with you today.    Call the [facility] office at [phone] if you have any questions, problems or concerns.

## 2019-04-20 ENCOUNTER — Ambulatory Visit (INDEPENDENT_AMBULATORY_CARE_PROVIDER_SITE_OTHER): Payer: Medicare Other | Admitting: *Deleted

## 2019-04-20 DIAGNOSIS — R001 Bradycardia, unspecified: Secondary | ICD-10-CM

## 2019-04-21 LAB — CUP PACEART REMOTE DEVICE CHECK
Battery Remaining Percentage: 80 %
Brady Statistic RV Percent Paced: 78 %
Date Time Interrogation Session: 20200604143809
Implantable Lead Implant Date: 20170719
Implantable Lead Implant Date: 20170719
Implantable Lead Location: 753859
Implantable Lead Location: 753860
Implantable Lead Model: 377
Implantable Lead Model: 377
Implantable Lead Serial Number: 49475245
Implantable Lead Serial Number: 49500032
Implantable Pulse Generator Implant Date: 20170719
Lead Channel Impedance Value: 468 Ohm
Lead Channel Pacing Threshold Amplitude: 0.7 V
Lead Channel Pacing Threshold Pulse Width: 0.4 ms
Lead Channel Sensing Intrinsic Amplitude: 12.4 mV
Lead Channel Setting Pacing Amplitude: 2.4 V
Lead Channel Setting Pacing Pulse Width: 0.4 ms
Pulse Gen Model: 407145
Pulse Gen Serial Number: 68814684

## 2019-04-29 ENCOUNTER — Encounter: Payer: Self-pay | Admitting: Cardiology

## 2019-04-29 NOTE — Progress Notes (Signed)
Remote pacemaker transmission.   

## 2019-05-07 ENCOUNTER — Emergency Department (HOSPITAL_COMMUNITY)
Admission: EM | Admit: 2019-05-07 | Discharge: 2019-05-07 | Disposition: A | Discharge status: Home / Self Care | Payer: Medicare Other | Attending: Emergency Medicine | Admitting: Emergency Medicine

## 2019-05-07 ENCOUNTER — Other Ambulatory Visit: Payer: Self-pay

## 2019-05-07 ENCOUNTER — Encounter (HOSPITAL_COMMUNITY): Payer: Self-pay | Admitting: *Deleted

## 2019-05-07 DIAGNOSIS — Z950 Presence of cardiac pacemaker: Secondary | ICD-10-CM | POA: Insufficient documentation

## 2019-05-07 DIAGNOSIS — K625 Hemorrhage of anus and rectum: Secondary | ICD-10-CM | POA: Diagnosis present

## 2019-05-07 DIAGNOSIS — Z79899 Other long term (current) drug therapy: Secondary | ICD-10-CM | POA: Diagnosis not present

## 2019-05-07 DIAGNOSIS — Z8673 Personal history of transient ischemic attack (TIA), and cerebral infarction without residual deficits: Secondary | ICD-10-CM | POA: Diagnosis not present

## 2019-05-07 DIAGNOSIS — Z7901 Long term (current) use of anticoagulants: Secondary | ICD-10-CM | POA: Insufficient documentation

## 2019-05-07 DIAGNOSIS — Z87891 Personal history of nicotine dependence: Secondary | ICD-10-CM | POA: Insufficient documentation

## 2019-05-07 LAB — COMPREHENSIVE METABOLIC PANEL
ALT: 12 U/L (ref 0–44)
AST: 16 U/L (ref 15–41)
Albumin: 4.1 g/dL (ref 3.5–5.0)
Alkaline Phosphatase: 63 U/L (ref 38–126)
Anion gap: 11 (ref 5–15)
BUN: 21 mg/dL (ref 8–23)
CO2: 24 mmol/L (ref 22–32)
Calcium: 9.4 mg/dL (ref 8.9–10.3)
Chloride: 107 mmol/L (ref 98–111)
Creatinine, Ser: 0.92 mg/dL (ref 0.44–1.00)
GFR calc Af Amer: >60 mL/min (ref >60)
GFR calc non Af Amer: 56 mL/min — ABNORMAL LOW (ref >60)
Glucose, Bld: 107 mg/dL — ABNORMAL HIGH (ref 70–99)
Potassium: 3.6 mmol/L (ref 3.5–5.1)
Sodium: 142 mmol/L (ref 135–145)
Total Bilirubin: 0.8 mg/dL (ref 0.3–1.2)
Total Protein: 6.8 g/dL (ref 6.5–8.1)

## 2019-05-07 LAB — TYPE AND SCREEN
ABO/RH(D): O NEG
Antibody Screen: NEGATIVE

## 2019-05-07 LAB — CBC WITH DIFFERENTIAL/PLATELET
Abs Immature Granulocytes: 0.05 10*3/uL (ref 0.00–0.07)
Basophils Absolute: 0.1 10*3/uL (ref 0.0–0.1)
Basophils Relative: 0 %
Eosinophils Absolute: 0.4 10*3/uL (ref 0.0–0.5)
Eosinophils Relative: 3 %
HCT: 36.5 % (ref 36.0–46.0)
Hemoglobin: 11.4 g/dL — ABNORMAL LOW (ref 12.0–15.0)
Immature Granulocytes: 0 %
Lymphocytes Relative: 10 %
Lymphs Abs: 1.2 10*3/uL (ref 0.7–4.0)
MCH: 30.6 pg (ref 26.0–34.0)
MCHC: 31.2 g/dL (ref 30.0–36.0)
MCV: 97.9 fL (ref 80.0–100.0)
Monocytes Absolute: 0.8 10*3/uL (ref 0.1–1.0)
Monocytes Relative: 6 %
Neutro Abs: 9.8 10*3/uL — ABNORMAL HIGH (ref 1.7–7.7)
Neutrophils Relative %: 81 %
Platelets: 215 10*3/uL (ref 150–400)
RBC: 3.73 MIL/uL — ABNORMAL LOW (ref 3.87–5.11)
RDW: 12.6 % (ref 11.5–15.5)
WBC: 12.3 10*3/uL — ABNORMAL HIGH (ref 4.0–10.5)
nRBC: 0 % (ref 0.0–0.2)

## 2019-05-07 LAB — POC OCCULT BLOOD, ED: Fecal Occult Bld: POSITIVE — AB

## 2019-05-07 LAB — ABO/RH: ABO/RH(D): O NEG

## 2019-05-07 LAB — PROTIME-INR
INR: 2.4 — ABNORMAL HIGH (ref 0.8–1.2)
Prothrombin Time: 25.6 seconds — ABNORMAL HIGH (ref 11.4–15.2)

## 2019-05-07 NOTE — ED Notes (Signed)
Pt. Placed on purewick.  

## 2019-05-07 NOTE — ED Notes (Signed)
Discussed discharge papers via phone with pt's daughter Caren Griffins. Made daughter aware of symptoms needing emergency evaluation, daughter acknowledged.

## 2019-05-07 NOTE — Discharge Instructions (Addendum)
1.  Do not take your Coumadin dose for tomorrow or the next day, then resume your normal dosing pattern. 2.  Call your doctor on Monday to schedule a recheck.  Turn to the emergency department if you continue to have large amount of bleeding or develop lightheadedness or other concerning symptoms.

## 2019-05-07 NOTE — ED Provider Notes (Signed)
[facility] Provider Note   CSN: [id number] Arrival date & time: 05/07/19  1204    History   Chief Complaint Chief Complaint  Patient presents with  . GI Problem    HPI Carol Snyder is a 85 y.o. female.     HPI Patient reports 2 episodes of bright red rectal bleeding today.  Denies any associated pain.  She is on Coumadin.  Not feel lightheaded or dizzy.  She reports she has had GI bleed several years ago and had to be hospitalized.  She is otherwise been feeling well. Past Medical History:  Diagnosis Date  . Anemia   . Aortic insufficiency   . Chest pain   . Chronic diarrhea   . DNR (do not resuscitate)   . Falling   . History of GI bleed   . History of shingles   . Long term current use of anticoagulant   . Mitral regurgitation   . Orthostatic hypotension   . Persistent atrial fibrillation    on Coumadin  . Pleural effusion   . Sinus node dysfunction (HCC)    a. s/p Biotronik PPM.  . Stroke (HCC)   . Tricuspid regurgitation     Patient Active Problem List   Diagnosis Date Noted  . Acute on chronic systolic CHF (congestive heart failure) (HCC) 09/28/2018  . Hypokalemia 09/28/2018  . Pleural effusion 06/20/2018  . Protein-calorie malnutrition, severe 06/07/2018  . Dehydration   . Laceration of right lower leg   . Fall 06/06/2018  . AKI (acute kidney injury) (HCC) 06/06/2018  . Diarrhea 06/06/2018  . Cardiac pacemaker in situ 06/06/2018  . Chronic anticoagulation 06/06/2018  . Atrial fibrillation, chronic 07/04/2011    Past Surgical History:  Procedure Laterality Date  . APPENDECTOMY    . CHOLECYSTECTOMY     with partial colectomy do to gallbadder infection   . IR THORACENTESIS ASP PLEURAL SPACE W/IMG GUIDE  06/22/2018  . IR THORACENTESIS ASP PLEURAL SPACE W/IMG GUIDE  09/29/2018  . PACEMAKER IMPLANT  06/04/2016   BTK Edora 8 DR-T implanted by Dr Rudolpho [facility]vinAkbary at [facility] for sick sinus syndrome     OB  History   No obstetric history on file.      Home Medications    Prior to Admission medications   Medication Sig Start Date End Date Taking? Authorizing Provider  acetaminophen (TYLENOL) 325 MG tablet Take 650 mg by mouth every 4 (four) hours as needed for mild pain or fever (or if temp is 99.0 or greater for 72 hours.).    [provider]  alendronate (FOSAMAX) 70 MG tablet Take 70 mg by mouth once a week. Take with a full glass of water on an empty stomach.    [provider]  diltiazem (CARDIZEM CD) 180 MG 24 hr capsule Take 1 capsule (180 mg total) by mouth daily. 10/29/18   Jake BatheSkains, Mark C, MD  diphenoxylate-atropine (LOMOTIL) 2.5-0.025 MG tablet Take 1 tablet by mouth 4 (four) times daily as needed for diarrhea or loose stools. 06/09/18   Tyrone NineGrunz, Ryan B, MD  furosemide (LASIX) 80 MG tablet Take 80 mg by mouth daily.     [provider]  hydroxypropyl methylcellulose / hypromellose (ISOPTO TEARS / GONIOVISC) 2.5 % ophthalmic solution Place 1 drop into both eyes 3 (three) times daily as needed for dry eyes.    [provider]  loperamide (IMODIUM) 2 MG capsule Take 4 mg by mouth daily.  [provider]  Menthol, Topical Analgesic, (BIOFREEZE EX) Apply 1 application topically daily as needed (pain).    [provider]  metoprolol tartrate (LOPRESSOR) 25 MG tablet Take 1 tablet (25 mg total) by mouth 2 (two) times daily. 10/29/18   Jake BatheSkains, Mark C, MD  potassium chloride SA (K-DUR,KLOR-CON) 20 MEQ tablet Take 1 tablet (20 mEq total) by mouth daily. 11/01/18   Jake BatheSkains, Mark C, MD  warfarin (COUMADIN) 2.5 MG tablet Take 2.5 mg by mouth See admin instructions. Take 1 table for two days then skip a day and repeat.    [provider]    Family History Family History  Problem Relation Age of Onset  . Heart attack Father   . Hypertension Father   . Stroke Mother   . Cancer Brother        lung  . Arrhythmia Daughter        heart  palpitations    Social History Social History   Tobacco Use  . Smoking status: Former Smoker    Quit date: 11/17/2004    Years since quitting: 14.4  . Smokeless tobacco: Never Used  Substance Use Topics  . Alcohol use: No  . Drug use: No     Allergies   Codeine, Enbrel [etanercept], Fosamax [alendronate sodium], Hydromorphone, Motrin [ibuprofen], Sulfa antibiotics, Voltaren [diclofenac], and Naproxen sodium   Review of Systems Review of Systems 10 Systems reviewed and are negative for acute change except as noted in the HPI.   Physical Exam Updated Vital Signs BP 115/68 (BP Location: Right Arm)   Pulse 91   Temp 98.2 F (36.8 C)   Resp 18   Ht 5\' 5"  (1.651 m)   Wt 49.9 kg   SpO2 92%   BMI 18.30 kg/m   Physical Exam Constitutional:      Appearance: She is well-developed.  HENT:     Head: Normocephalic and atraumatic.  Eyes:     Pupils: Pupils are equal, round, and reactive to light.  Neck:     Musculoskeletal: Neck supple.  Cardiovascular:     Rate and Rhythm: Normal rate and regular rhythm.     Heart sounds: Murmur present.  Pulmonary:     Effort: Pulmonary effort is normal.     Breath sounds: Normal breath sounds.  Abdominal:     General: Bowel sounds are normal. There is no distension.     Palpations: Abdomen is soft.     Tenderness: There is no abdominal tenderness.  Genitourinary:    Comments: No stool in the vault. Musculoskeletal: Normal range of motion.  Skin:    General: Skin is warm and dry.  Neurological:     Mental Status: She is alert and oriented to person, place, and time.     GCS: GCS eye subscore is 4. GCS verbal subscore is 5. GCS motor subscore is 6.     Coordination: Coordination normal.      ED Treatments / Results  Labs (all labs ordered are listed, but only abnormal results are displayed) Labs Reviewed  COMPREHENSIVE METABOLIC PANEL - Abnormal; Notable for the following components:      Result Value   Glucose, Bld 107  (*)    GFR calc non Af Amer 56 (*)    All other components within normal limits  CBC WITH DIFFERENTIAL/PLATELET - Abnormal; Notable for the following components:   WBC 12.3 (*)    RBC 3.73 (*)    Hemoglobin 11.4 (*)    Neutro  Abs 9.8 (*)    All other components within normal limits  PROTIME-INR - Abnormal; Notable for the following components:   Prothrombin Time 25.6 (*)    INR 2.4 (*)    All other components within normal limits  POC OCCULT BLOOD, ED - Abnormal; Notable for the following components:   Fecal Occult Bld POSITIVE (*)    All other components within normal limits  TYPE AND SCREEN  ABO/RH    EKG None  Radiology No results found.  Procedures Procedures (including critical care time)  Medications Ordered in ED Medications - No data to display   Initial Impression / Assessment and Plan / ED Course  I have reviewed the triage vital signs and the nursing notes.  Pertinent labs & imaging results that were available during my care of the patient were reviewed by me and considered in my medical decision making (see chart for details).        Patient had rectal bleeding this morning.  She is anticoagulated.  She is therapeutic.  Patient has had similar episode on anticoagulants.  At this time, there is no blood in the rectal vault.  There has been no further bleeding while in the emergency department.  She is vital signs and lab work is stable.  We will plan for 2 days of holding Coumadin and follow-up for INR and determination of restarting with her PCP.  Strict return precautions reviewed with the patient and her daughter over the phone.  Final Clinical Impressions(s) / ED Diagnoses   Final diagnoses:  Rectal bleed  Anticoagulated    ED Discharge Orders    None       Charlesetta Shanks, MD 05/09/19 1137

## 2019-05-07 NOTE — ED Triage Notes (Signed)
Pt reports  Rectal bleeding started at 0800 today . Pt reports 2 episodes of rectal bleeding  This AM.

## 2019-07-04 ENCOUNTER — Telehealth: Payer: Self-pay | Admitting: Cardiology

## 2019-07-04 NOTE — Telephone Encounter (Signed)
Spoke with patient's daughter Caren Griffins (on [place])  Saturday the patient didn't feel well but didn't say anything until Sunday to her daughter.  Daughter reports she has been out of breath off and on, sometimes feels clammy and admitted to "not feeling good" on Sunday.  Daughter had EMS come to the house yesterday since pt did not want to go to to the ED.  They did EKG demonstrating At Fib (daughter has strips) and advised her to increase fluid intake as she seemed dehydrated to them.  Pt was up a lot last night going to the bathroom.  Today, other than being tired she feels "OK"  Daughter is requesting pt be seen in the office for evaluation.  Appt scheduled for 8/18 with Dr Marlou Porch at 10 am.     COVID-19 Pre-Screening Questions:  . In the past 7 to 10 days have you had a cough,  shortness of breath, headache, congestion, fever (100 or greater) body aches, chills, sore throat, or sudden loss of taste or sense of smell? NO . Have you been around anyone with known Covid 19. . Have you been around anyone who is awaiting Covid 19 test results in the past 7 to 10 days?  NO . Have you been around anyone who has been exposed to Covid 19, or has mentioned symptoms of Covid 19 within the past 7 to 10 days?  NO  If you have any concerns/questions about symptoms patients report during screening (either on the phone or at threshold). Contact the provider seeing the patient or DOD for further guidance.  If neither are available contact a member of the leadership team. Daughter is coming with patient to her appt as pt has to use a w/c to get around.  Neither have had any s/s or exposure to Covid.  Both will where a mask.   Daughter will c/b prior to appt if any increase or change in s/s.

## 2019-07-04 NOTE — Telephone Encounter (Signed)
New message    Patient's daughter calling to report patient was in afib over the weekend. EMS was called over the weekend, declined to go to ED.  States her mother feels some better today, but still has concerns.  Patient c/o Palpitations:  High priority if patient c/o lightheadedness, shortness of breath, or chest pain  1) How long have you had palpitations/irregular HR/ Afib? Are you having the symptoms now? YES  Are you currently experiencing lightheadedness, SOB or CP?  a little dizziness at times, SOB at times 2) Do you have a history of afib (atrial fibrillation) or irregular heart rhythm? yes  3) Have you checked your BP or HR? (document readings if available): 102/72 HR 106  Are you experiencing any other symptoms?weakness

## 2019-07-05 ENCOUNTER — Inpatient Hospital Stay (HOSPITAL_COMMUNITY): Payer: Medicare Other

## 2019-07-05 ENCOUNTER — Inpatient Hospital Stay (HOSPITAL_COMMUNITY)
Admission: EM | Admit: 2019-07-05 | Discharge: 2019-07-09 | DRG: 291 | Disposition: A | Discharge status: Home / Self Care | Payer: Medicare Other | Attending: Internal Medicine | Admitting: Internal Medicine

## 2019-07-05 ENCOUNTER — Emergency Department (HOSPITAL_COMMUNITY): Payer: Medicare Other

## 2019-07-05 ENCOUNTER — Other Ambulatory Visit: Payer: Self-pay

## 2019-07-05 ENCOUNTER — Ambulatory Visit: Payer: Medicare Other | Admitting: Cardiology

## 2019-07-05 ENCOUNTER — Encounter (HOSPITAL_COMMUNITY): Payer: Self-pay

## 2019-07-05 DIAGNOSIS — I4821 Permanent atrial fibrillation: Secondary | ICD-10-CM | POA: Diagnosis present

## 2019-07-05 DIAGNOSIS — Z8249 Family history of ischemic heart disease and other diseases of the circulatory system: Secondary | ICD-10-CM

## 2019-07-05 DIAGNOSIS — I5023 Acute on chronic systolic (congestive) heart failure: Secondary | ICD-10-CM

## 2019-07-05 DIAGNOSIS — I951 Orthostatic hypotension: Secondary | ICD-10-CM | POA: Diagnosis present

## 2019-07-05 DIAGNOSIS — Z881 Allergy status to other antibiotic agents status: Secondary | ICD-10-CM

## 2019-07-05 DIAGNOSIS — I5021 Acute systolic (congestive) heart failure: Secondary | ICD-10-CM

## 2019-07-05 DIAGNOSIS — I509 Heart failure, unspecified: Secondary | ICD-10-CM | POA: Diagnosis not present

## 2019-07-05 DIAGNOSIS — K529 Noninfective gastroenteritis and colitis, unspecified: Secondary | ICD-10-CM

## 2019-07-05 DIAGNOSIS — Z681 Body mass index (BMI) 19 or less, adult: Secondary | ICD-10-CM | POA: Diagnosis not present

## 2019-07-05 DIAGNOSIS — I110 Hypertensive heart disease with heart failure: Secondary | ICD-10-CM | POA: Diagnosis not present

## 2019-07-05 DIAGNOSIS — R011 Cardiac murmur, unspecified: Secondary | ICD-10-CM

## 2019-07-05 DIAGNOSIS — Z886 Allergy status to analgesic agent status: Secondary | ICD-10-CM

## 2019-07-05 DIAGNOSIS — Z20828 Contact with and (suspected) exposure to other viral communicable diseases: Secondary | ICD-10-CM | POA: Diagnosis present

## 2019-07-05 DIAGNOSIS — Z950 Presence of cardiac pacemaker: Secondary | ICD-10-CM | POA: Diagnosis not present

## 2019-07-05 DIAGNOSIS — Z87891 Personal history of nicotine dependence: Secondary | ICD-10-CM | POA: Diagnosis not present

## 2019-07-05 DIAGNOSIS — Z882 Allergy status to sulfonamides status: Secondary | ICD-10-CM

## 2019-07-05 DIAGNOSIS — I482 Chronic atrial fibrillation, unspecified: Secondary | ICD-10-CM | POA: Diagnosis present

## 2019-07-05 DIAGNOSIS — J918 Pleural effusion in other conditions classified elsewhere: Secondary | ICD-10-CM | POA: Diagnosis present

## 2019-07-05 DIAGNOSIS — R54 Age-related physical debility: Secondary | ICD-10-CM | POA: Diagnosis present

## 2019-07-05 DIAGNOSIS — Z66 Do not resuscitate: Secondary | ICD-10-CM | POA: Diagnosis present

## 2019-07-05 DIAGNOSIS — Z9889 Other specified postprocedural states: Secondary | ICD-10-CM

## 2019-07-05 DIAGNOSIS — J90 Pleural effusion, not elsewhere classified: Secondary | ICD-10-CM | POA: Diagnosis present

## 2019-07-05 DIAGNOSIS — Z79899 Other long term (current) drug therapy: Secondary | ICD-10-CM

## 2019-07-05 DIAGNOSIS — Z7983 Long term (current) use of bisphosphonates: Secondary | ICD-10-CM | POA: Diagnosis not present

## 2019-07-05 DIAGNOSIS — I495 Sick sinus syndrome: Secondary | ICD-10-CM

## 2019-07-05 DIAGNOSIS — E43 Unspecified severe protein-calorie malnutrition: Secondary | ICD-10-CM | POA: Diagnosis present

## 2019-07-05 DIAGNOSIS — Z8673 Personal history of transient ischemic attack (TIA), and cerebral infarction without residual deficits: Secondary | ICD-10-CM

## 2019-07-05 DIAGNOSIS — Z823 Family history of stroke: Secondary | ICD-10-CM

## 2019-07-05 DIAGNOSIS — Z7901 Long term (current) use of anticoagulants: Secondary | ICD-10-CM | POA: Diagnosis not present

## 2019-07-05 DIAGNOSIS — I340 Nonrheumatic mitral (valve) insufficiency: Secondary | ICD-10-CM | POA: Diagnosis not present

## 2019-07-05 DIAGNOSIS — R0600 Dyspnea, unspecified: Secondary | ICD-10-CM | POA: Diagnosis not present

## 2019-07-05 DIAGNOSIS — I4891 Unspecified atrial fibrillation: Secondary | ICD-10-CM | POA: Diagnosis not present

## 2019-07-05 DIAGNOSIS — I361 Nonrheumatic tricuspid (valve) insufficiency: Secondary | ICD-10-CM | POA: Diagnosis not present

## 2019-07-05 DIAGNOSIS — I420 Dilated cardiomyopathy: Secondary | ICD-10-CM

## 2019-07-05 DIAGNOSIS — I5043 Acute on chronic combined systolic (congestive) and diastolic (congestive) heart failure: Secondary | ICD-10-CM | POA: Diagnosis present

## 2019-07-05 DIAGNOSIS — I4819 Other persistent atrial fibrillation: Secondary | ICD-10-CM | POA: Diagnosis not present

## 2019-07-05 DIAGNOSIS — Z888 Allergy status to other drugs, medicaments and biological substances status: Secondary | ICD-10-CM

## 2019-07-05 DIAGNOSIS — I429 Cardiomyopathy, unspecified: Secondary | ICD-10-CM | POA: Diagnosis present

## 2019-07-05 DIAGNOSIS — Z885 Allergy status to narcotic agent status: Secondary | ICD-10-CM | POA: Diagnosis not present

## 2019-07-05 HISTORY — PX: IR THORACENTESIS ASP PLEURAL SPACE W/IMG GUIDE: IMG5380

## 2019-07-05 LAB — CBC WITH DIFFERENTIAL/PLATELET
Abs Immature Granulocytes: 0.02 10*3/uL (ref 0.00–0.07)
Basophils Absolute: 0.1 10*3/uL (ref 0.0–0.1)
Basophils Relative: 1 %
Eosinophils Absolute: 0.3 10*3/uL (ref 0.0–0.5)
Eosinophils Relative: 5 %
HCT: 34.5 % — ABNORMAL LOW (ref 36.0–46.0)
Hemoglobin: 10.7 g/dL — ABNORMAL LOW (ref 12.0–15.0)
Immature Granulocytes: 0 %
Lymphocytes Relative: 17 %
Lymphs Abs: 1.1 10*3/uL (ref 0.7–4.0)
MCH: 30.1 pg (ref 26.0–34.0)
MCHC: 31 g/dL (ref 30.0–36.0)
MCV: 96.9 fL (ref 80.0–100.0)
Monocytes Absolute: 0.7 10*3/uL (ref 0.1–1.0)
Monocytes Relative: 11 %
Neutro Abs: 4.1 10*3/uL (ref 1.7–7.7)
Neutrophils Relative %: 66 %
Platelets: 169 10*3/uL (ref 150–400)
RBC: 3.56 MIL/uL — ABNORMAL LOW (ref 3.87–5.11)
RDW: 13.2 % (ref 11.5–15.5)
WBC: 6.2 10*3/uL (ref 4.0–10.5)
nRBC: 0 % (ref 0.0–0.2)

## 2019-07-05 LAB — BASIC METABOLIC PANEL
Anion gap: 10 (ref 5–15)
BUN: 13 mg/dL (ref 8–23)
CO2: 24 mmol/L (ref 22–32)
Calcium: 9.2 mg/dL (ref 8.9–10.3)
Chloride: 102 mmol/L (ref 98–111)
Creatinine, Ser: 0.93 mg/dL (ref 0.44–1.00)
GFR calc Af Amer: >60 mL/min (ref >60)
GFR calc non Af Amer: 55 mL/min — ABNORMAL LOW (ref >60)
Glucose, Bld: 101 mg/dL — ABNORMAL HIGH (ref 70–99)
Potassium: 3.9 mmol/L (ref 3.5–5.1)
Sodium: 136 mmol/L (ref 135–145)

## 2019-07-05 LAB — ECHOCARDIOGRAM COMPLETE
Height: 67 in
Weight: 1760 oz

## 2019-07-05 LAB — PROTIME-INR
INR: 2.1 — ABNORMAL HIGH (ref 0.8–1.2)
Prothrombin Time: 23.1 seconds — ABNORMAL HIGH (ref 11.4–15.2)

## 2019-07-05 LAB — TROPONIN I (HIGH SENSITIVITY)
Troponin I (High Sensitivity): 8 ng/L (ref <18)
Troponin I (High Sensitivity): 7 ng/L (ref <18)

## 2019-07-05 LAB — SARS CORONAVIRUS 2 BY RT PCR (HOSPITAL ORDER, PERFORMED IN ~~LOC~~ HOSPITAL LAB): SARS Coronavirus 2: NEGATIVE

## 2019-07-05 LAB — BRAIN NATRIURETIC PEPTIDE: B Natriuretic Peptide: 1259.5 pg/mL — ABNORMAL HIGH (ref 0.0–100.0)

## 2019-07-05 MED ORDER — LIDOCAINE HCL (PF) 1 % IJ SOLN
INTRAMUSCULAR | Status: DC | PRN
  Administered 2019-07-05: 5 mL

## 2019-07-05 MED ORDER — DIPHENOXYLATE-ATROPINE 2.5-0.025 MG PO TABS: 1.0000 | ORAL_TABLET | Freq: Four times a day (QID) | ORAL | Status: DC | PRN

## 2019-07-05 MED ORDER — CHOLESTYRAMINE 4 G PO PACK: 4.0000 g | PACK | Freq: Three times a day (TID) | ORAL | Status: DC

## 2019-07-05 MED ORDER — DILTIAZEM HCL ER COATED BEADS 180 MG PO CP24
180.0000 mg | ORAL_CAPSULE | Freq: Every day | ORAL | Status: DC
  Filled 2019-07-05: qty 1

## 2019-07-05 MED ORDER — METOPROLOL SUCCINATE ER 100 MG PO TB24
100.0000 mg | ORAL_TABLET | Freq: Every day | ORAL | Status: DC
  Administered 2019-07-05 – 2019-07-09 (×5): 100 mg via ORAL
  Filled 2019-07-05 (×5): qty 1

## 2019-07-05 MED ORDER — ACETAMINOPHEN 650 MG RE SUPP: 650.0000 mg | Freq: Four times a day (QID) | RECTAL | Status: DC | PRN

## 2019-07-05 MED ORDER — POTASSIUM CHLORIDE CRYS ER 20 MEQ PO TBCR
20.0000 meq | EXTENDED_RELEASE_TABLET | Freq: Every day | ORAL | Status: DC
  Administered 2019-07-05 – 2019-07-09 (×5): 20 meq via ORAL
  Filled 2019-07-05 (×5): qty 1

## 2019-07-05 MED ORDER — POLYETHYLENE GLYCOL 3350 17 G PO PACK: 17.0000 g | PACK | Freq: Every day | ORAL | Status: DC | PRN

## 2019-07-05 MED ORDER — SODIUM CHLORIDE 0.9% FLUSH
3.0000 mL | Freq: Two times a day (BID) | INTRAVENOUS | Status: DC
  Administered 2019-07-05 – 2019-07-09 (×9): 3 mL via INTRAVENOUS

## 2019-07-05 MED ORDER — METOPROLOL SUCCINATE ER 100 MG PO TB24: 100.0000 mg | ORAL_TABLET | Freq: Every day | ORAL | Status: DC

## 2019-07-05 MED ORDER — FUROSEMIDE 10 MG/ML IJ SOLN
40.0000 mg | Freq: Two times a day (BID) | INTRAMUSCULAR | Status: DC
  Administered 2019-07-06 – 2019-07-08 (×5): 40 mg via INTRAVENOUS
  Filled 2019-07-05 (×5): qty 4

## 2019-07-05 MED ORDER — LOPERAMIDE HCL 2 MG PO CAPS
4.0000 mg | ORAL_CAPSULE | Freq: Every day | ORAL | Status: DC
  Administered 2019-07-05 – 2019-07-09 (×5): 4 mg via ORAL
  Filled 2019-07-05 (×6): qty 2

## 2019-07-05 MED ORDER — CHOLESTYRAMINE 4 G PO PACK
4.0000 g | PACK | Freq: Every day | ORAL | Status: DC
  Administered 2019-07-06 – 2019-07-09 (×4): 4 g via ORAL
  Filled 2019-07-05 (×5): qty 1

## 2019-07-05 MED ORDER — FUROSEMIDE 10 MG/ML IJ SOLN
80.0000 mg | Freq: Once | INTRAMUSCULAR | Status: AC
  Administered 2019-07-05: 80 mg via INTRAVENOUS
  Filled 2019-07-05: qty 8

## 2019-07-05 MED ORDER — METOPROLOL TARTRATE 25 MG PO TABS
25.0000 mg | ORAL_TABLET | Freq: Two times a day (BID) | ORAL | Status: DC
  Administered 2019-07-05: 25 mg via ORAL
  Filled 2019-07-05: qty 1

## 2019-07-05 MED ORDER — FUROSEMIDE 10 MG/ML IJ SOLN
80.0000 mg | Freq: Two times a day (BID) | INTRAMUSCULAR | Status: DC
  Filled 2019-07-05: qty 8

## 2019-07-05 MED ORDER — ACETAMINOPHEN 325 MG PO TABS
650.0000 mg | ORAL_TABLET | Freq: Four times a day (QID) | ORAL | Status: DC | PRN
  Administered 2019-07-08 – 2019-07-09 (×2): 650 mg via ORAL
  Filled 2019-07-05 (×2): qty 2

## 2019-07-05 MED ORDER — HYPROMELLOSE (GONIOSCOPIC) 2.5 % OP SOLN
1.0000 [drp] | Freq: Three times a day (TID) | OPHTHALMIC | Status: DC | PRN
  Filled 2019-07-05: qty 15

## 2019-07-05 MED ORDER — WARFARIN SODIUM 2.5 MG PO TABS
2.5000 mg | ORAL_TABLET | Freq: Once | ORAL | Status: AC
  Administered 2019-07-05: 2.5 mg via ORAL
  Filled 2019-07-05 (×2): qty 1

## 2019-07-05 MED ORDER — ALBUTEROL SULFATE (2.5 MG/3ML) 0.083% IN NEBU: 5.0000 mg | INHALATION_SOLUTION | Freq: Once | RESPIRATORY_TRACT | Status: DC

## 2019-07-05 MED ORDER — LIDOCAINE HCL 1 % IJ SOLN
INTRAMUSCULAR | Status: AC
  Filled 2019-07-05: qty 20

## 2019-07-05 MED ORDER — WARFARIN - PHARMACIST DOSING INPATIENT
Freq: Every day | Status: DC
  Administered 2019-07-08: 17:00:00

## 2019-07-05 NOTE — ED Triage Notes (Addendum)
Pt arrives EMS from home with c/o [facility] over last 3-4 days and much worse last night. Swelling noted at feet over last week. Paced rhythm. Hx of CHF . Pt on 2l/ [place] o2 and sat 99%.

## 2019-07-05 NOTE — ED Provider Notes (Signed)
Medical screening examination/treatment/procedure(s) were conducted as a shared visit with non-physician practitioner(s) and myself.  I personally evaluated the patient during the encounter.  EKG Interpretation  Date/Time:  Tuesday July 05 2019 08:15:08 EDT Ventricular Rate:  111 PR Interval:    QRS Duration: 156 QT Interval:  400 QTC Calculation: 544 R Axis:   -93 Text Interpretation:  Sinus tachycardia Atrial premature complex IVCD, consider atypical RBBB Inferior infarct, old Old LBBB. reversal of prior lateral t wave inversions Confirmed by Charlesetta Shanks [phone]) on 07/05/2019 8:22:27 AM Also confirmed by Charlesetta Shanks [phone]), editor Philomena Doheny [phone])  on 07/05/2019 8:54:10 AM Patient reports that she felt pretty well yesterday.  Last night she started getting short of breath.  She had difficulty sleeping.  Her daughter reports she is had some problems before of "fluid buildup in 1 of the lungs".  They also note there is some more swelling of her legs.  Patient denies any chest pain.  She reports she has felt fatigued and not so well.  She denies she is been having any cough or productive cough.  She denies chest pain.  She did however note a few days ago it kind of felt like it was "burning" in the area of her pacer.  Patient is alert and appropriate.  No respiratory distress.  Heart has ectopic beats.  Monitor shows intermittently paced rhythm.  Lungs have adequate air flow but decreased at the right base and some expiratory wheeze at the left base.  Abdomen is soft and nontender.  Little asymmetric edema of the left lower extremity.  Skin changes of chronic venous stasis.  I agree with plan of management.     Charlesetta Shanks, MD 07/05/19 [phone]

## 2019-07-05 NOTE — ED Notes (Signed)
Pt is hooked up to purewick and suction canister. 

## 2019-07-05 NOTE — H&P (Signed)
Date: 07/05/2019               Patient Name:  Carol Snyder MRN: [id number]  DOB: [birth year] Age / Sex: 85 y.o., female   PCP: Carol MaskElkins, Wilson Oliver, MD         Medical Service: Internal Medicine Teaching Service         Attending Physician: Dr. Gust RungHoffman, Erik C, DO    First Contact: Dr. Huel CoteBasaraba Pager: [phone]  Second Contact: Dr. Caron PresumeHelberg Pager: [phone]       After Hours (After 5p/  First Contact Pager: [phone]  weekends / holidays): Second Contact Pager: [phone]   Chief Complaint: Shortness of Breath  History of Present Illness: [name] is an 85 yo F with hx of CHF (EF 50-55%), Atrial fibrillation (on Warfarin), Sinus Node Dysfunction (Pacemaker in place), Recurrent transudative R pleural effusion, and Chronic diarrhea who presented with shortness of breath. She reports 3-4 days of progressive shortness of breath that worsened further during the past night. She reports orthopnea, PND, and increased LE edema. She denies any changes to her diet, fluid intake (tries to drink 3 bottles of water per day), Urine output (possibly increased per daughter), nor any medication changes. She reports chronic diarrhea. She denies Chest pain, fevers, cough, nausea, or sick contacts.  In the ED: BMP WNL; CBC showed Hgb stable at 10.7; INR 2.1 (on warfarin); BNP elevated to 1200; and hsTroponin 8 with second pending. COVID pending, but not suspected. CXR showed interstitial edema and increased chronic R pleural effusion. She received dose of IVF Lasix and albuterol neb in the ED. Will admit for further workup and care.  Meds:  Current Meds  Medication Sig  . acetaminophen (TYLENOL) 325 MG tablet Take 650 mg by mouth every 4 (four) hours as needed for mild pain or fever (or if temp is 99.0 or greater for 72 hours.).  Marland Kitchen. alendronate (FOSAMAX) 70 MG tablet Take 70 mg by mouth once a week. Sunday  . cholestyramine (QUESTRAN) 4 g packet Take 4 g by mouth 3 (three) times daily with meals.  Marland Kitchen. diltiazem  (CARDIZEM CD) 180 MG 24 hr capsule Take 1 capsule (180 mg total) by mouth daily.  . diphenoxylate-atropine (LOMOTIL) 2.5-0.025 MG tablet Take 1 tablet by mouth 4 (four) times daily as needed for diarrhea or loose stools.  . furosemide (LASIX) 80 MG tablet Take 80 mg by mouth daily.   . hydroxypropyl methylcellulose / hypromellose (ISOPTO TEARS / GONIOVISC) 2.5 % ophthalmic solution Place 1 drop into both eyes 3 (three) times daily as needed for dry eyes.  Marland Kitchen. loperamide (IMODIUM) 2 MG capsule Take 4 mg by mouth daily.   . Menthol, Topical Analgesic, (BIOFREEZE EX) Apply 1 application topically daily as needed (pain).  . metoprolol tartrate (LOPRESSOR) 25 MG tablet Take 1 tablet (25 mg total) by mouth 2 (two) times daily.  . potassium chloride SA (K-DUR,KLOR-CON) 20 MEQ tablet Take 1 tablet (20 mEq total) by mouth daily.  Marland Kitchen. warfarin (COUMADIN) 5 MG tablet Take 2.5-5 mg by mouth See admin instructions. Take 2.5 mg  All the other days at bedtime Take 5 mg on Sunday   Allergies: Allergies as of 07/05/2019 - Review Complete 07/05/2019  Allergen Reaction Noted  . Codeine Other (See Comments) 06/20/2018  . Enbrel [etanercept] Other (See Comments) 06/20/2018  . Fosamax [alendronate sodium] Other (See Comments) 06/20/2018  . Hydromorphone Other (See Comments) 06/20/2018  . Motrin [ibuprofen] Other (See Comments) 06/20/2018  .  Sulfa antibiotics Other (See Comments) 06/27/2011  . Voltaren [diclofenac] Other (See Comments) 06/20/2018  . Naproxen sodium Palpitations 06/27/2011   Past Medical History:  Diagnosis Date  . Anemia   . Aortic insufficiency   . Chest pain   . Chronic diarrhea   . DNR (do not resuscitate)   . Falling   . History of GI bleed   . History of shingles   . Long term current use of anticoagulant   . Mitral regurgitation   . Orthostatic hypotension   . Persistent atrial fibrillation    on Coumadin  . Pleural effusion   . Sinus node dysfunction (HCC)    a. s/p Biotronik  PPM.  . Stroke (HCC)   . Tricuspid regurgitation    Family History:  Family History  Problem Relation Age of Onset  . Heart attack Father   . Hypertension Father   . Stroke Mother   . Cancer Brother        lung  . Arrhythmia Daughter        heart palpitations  Reviewed on Admission  Social History:  Social History   Tobacco Use  . Smoking status: Former Smoker    Quit date: 11/17/2004    Years since quitting: 14.6  . Smokeless tobacco: Never Used  Substance Use Topics  . Alcohol use: No  . Drug use: No  Reviewed on admission  Review of Systems: A complete ROS was negative except as per HPI.  Physical Exam: Blood pressure 133/83, pulse (!) 116, temperature 98.1 F (36.7 C), temperature source Oral, resp. rate (!) 23, height 5\' 7"  (1.702 m), weight 49.9 kg, SpO2 95 %. Physical Exam Constitutional:      General: She is not in acute distress.    Appearance: Normal appearance.  Neck:     Comments: + JVD Cardiovascular:     Rate and Rhythm: Tachycardia present. Rhythm irregular.     Pulses: Normal pulses.  Pulmonary:     Effort: Respiratory distress present.     Comments: Mild Resp distress Decreased Breath sounds at RLL Rales at LLL Abdominal:     General: Bowel sounds are normal. There is no distension.     Palpations: Abdomen is soft.     Tenderness: There is no abdominal tenderness.  Musculoskeletal:        General: No swelling or deformity.     Left lower leg: Edema present.     Comments: 1+ LLE ankle edema Trace RLE edema  Skin:    General: Skin is warm and dry.  Neurological:     General: No focal deficit present.     Mental Status: Mental status is at baseline.  Psychiatric:        Mood and Affect: Mood normal.        Behavior: Behavior normal.    EKG: personally reviewed my interpretation is Sinus tachycardia, IVCD (known sinus node dysfunction)   CXR: personally reviewed my interpretation is interstitial edema, Moderate to large R pleural  effusion  Assessment & Plan by Problem: Active Problems:   CHF exacerbation (HCC)  Dyspnea Pleural Effusion CHF Exacerbation: (Last EF 50-55%, 2019; Presume Diastolic) 3-4d of progressive dyspnea and edema. BNP 1200, JVD +, Increased Right Pleural effusion. Patient has recurrent R pleural effusion drained on 06/22/2018 with transudative fluid, negative for malignancy and negative cultures; drained again on 09/29/2018. This is believed to be manifestation of heart failure exacerbation in her. Her HF is likely more tenuous given her atrial fibrillation  and sinus nodal dysfunction. Dose of lasix has been increased in the past year from 40 to 80mg  Daily. - Thoracentesis (Likely by IR) - Home Metoprolol 25mg  BID - Lasix 80mg  IV BID - K-Dur 90mEq Daily - Tele, Daily Weight, Strict I/O - Trend BMP  Atrial Fibrillation Sinus Nodal Dysfunction: likely exacerbated in setting of CHF exacerbation. Rate 110s-120s in ED. Pacemaker in place. On Warfarin with therapeutic INR. Continue home meds.  - Pacemaker in place - Continue Warfarin (per pharmacy) - Diltiazem 180mg  Daily - Metoprolol 25mg  BID  Chronic Diarrhea: Continue home meds. - Cholestyramine, 4g Daily (prescribed as TID, but takes Daily),  - Loperamide 4mg  Daily - Lomotil TID PRN   FEN: Replete lytes prn, HH diet 1200 cc restriction VTE ppx: Warfarin Code Status: DNR   Dispo: Admit patient to Inpatient with expected length of stay greater than 2 midnights.  Signed: Neva Seat, MD 07/05/2019, 11:29 AM  Pager: [phone]

## 2019-07-05 NOTE — ED Provider Notes (Signed)
[facility]   CSN: [id number] Arrival date & time: 07/05/19  [id number]    History   Chief Complaint Chief Complaint  Patient presents with  . Shortness of Breath    HPI Carol Snyder is a 85 y.o. female with history of CHF, atrial fibrillation anticoagulated on Coumadin sinus node dysfunction with pacemaker who presents with worsening shortness of breath over the last 3 to 4 days.  Her symptoms got much worse last night.  She has noted swelling to her feet for the last week.  She has been taking 80 mg Lasix daily and has had no recent medication changes.  She denies any fever or cough, chest pain, abdominal pain, nausea, vomiting.  Patient does not wear oxygen at home.     The history is provided by the patient and a relative.    Past Medical History:  Diagnosis Date  . Anemia   . Aortic insufficiency   . Chest pain   . Chronic diarrhea   . DNR (do not resuscitate)   . Falling   . History of GI bleed   . History of shingles   . Long term current use of anticoagulant   . Mitral regurgitation   . Orthostatic hypotension   . Persistent atrial fibrillation    on Coumadin  . Pleural effusion   . Sinus node dysfunction (HCC)    a. s/p Biotronik PPM.  . Stroke (HCC)   . Tricuspid regurgitation     Patient Active Problem List   Diagnosis Date Noted  . CHF exacerbation (HCC) 07/05/2019  . Acute on chronic systolic CHF (congestive heart failure) (HCC) 09/28/2018  . Hypokalemia 09/28/2018  . Pleural effusion 06/20/2018  . Protein-calorie malnutrition, severe 06/07/2018  . Dehydration   . Laceration of right lower leg   . Fall 06/06/2018  . AKI (acute kidney injury) (HCC) 06/06/2018  . Diarrhea 06/06/2018  . Cardiac pacemaker in situ 06/06/2018  . Chronic anticoagulation 06/06/2018  . Atrial fibrillation, chronic 07/04/2011    Past Surgical History:  Procedure Laterality Date  . APPENDECTOMY    . CHOLECYSTECTOMY     with partial colectomy do to gallbadder infection   . IR THORACENTESIS ASP PLEURAL SPACE W/IMG GUIDE  06/22/2018  . IR THORACENTESIS ASP PLEURAL SPACE W/IMG GUIDE  09/29/2018  . PACEMAKER IMPLANT  06/04/2016   BTK [place]ora 8 DR-T implanted by Dr Rudolph[facility]ary [facility] for sick sinus syndrome     OB History   No obstetric history on file.      Home Medications    Prior to Admission medications   Medication Sig Start Date End Date Taking? Authorizing Provider  acetaminophen (TYLENOL) 325 MG tablet Take 650 mg by mouth every 4 (four) hours as needed for mild pain or fever (or if temp is 99.0 or greater for 72 hours.).   Yes [provider]  alendronate (FOSAMAX) 70 MG tablet Take 70 mg by mouth once a week. Sunday   Yes [provider]  cholestyramine (QUESTRAN) 4 g packet Take 4 g by mouth 3 (three) times daily with meals.   Yes [provider]  diltiazem (CARDIZEM CD) 180 MG 24 hr capsule Take 1 capsule (180 mg total) by mouth daily. 10/29/18  Yes Jake BatheSkains, Mark C, MD  diphenoxylate-atropine (LOMOTIL) 2.5-0.025 MG tablet Take 1 tablet by mouth 4 (four) times daily as needed for diarrhea or loose stools. 06/09/18  Yes Tyrone NineGrunz, Ryan B, MD  furosemide (LASIX) 80 MG tablet Take 80 mg by mouth daily.    Yes [provider]  hydroxypropyl methylcellulose / hypromellose (ISOPTO TEARS / GONIOVISC) 2.5 % ophthalmic solution Place 1 drop into both eyes 3 (three) times daily as needed for dry eyes.   Yes [provider]  loperamide (IMODIUM) 2 MG capsule Take 4 mg by mouth daily.    Yes [provider]  Menthol, Topical Analgesic, (BIOFREEZE EX) Apply 1 application topically daily as needed (pain).   Yes [provider]  metoprolol tartrate (LOPRESSOR) 25 MG tablet Take 1 tablet (25 mg total) by mouth 2 (two) times daily. 10/29/18  Yes Jerline Pain, MD  potassium chloride SA (K-DUR,KLOR-CON) 20 MEQ tablet Take 1 tablet (20 mEq total) by  mouth daily. 11/01/18  Yes Jerline Pain, MD  warfarin (COUMADIN) 5 MG tablet Take 2.5-5 mg by mouth See admin instructions. Take 2.5 mg  All the other days at bedtime Take 5 mg on Sunday   Yes [provider]    Family History Family History  Problem Relation Age of Onset  . Heart attack Father   . Hypertension Father   . Stroke Mother   . Cancer Brother        lung  . Arrhythmia Daughter        heart palpitations    Social History Social History   Tobacco Use  . Smoking status: Former Smoker    Quit date: 11/17/2004    Years since quitting: 14.6  . Smokeless tobacco: Never Used  Substance Use Topics  . Alcohol use: No  . Drug use: No     Allergies   Codeine, Enbrel [etanercept], Fosamax [alendronate sodium], Hydromorphone, Motrin [ibuprofen], Sulfa antibiotics, Voltaren [diclofenac], and Naproxen sodium   Review of Systems Review of Systems  Constitutional: Negative for chills and fever.  HENT: Negative for facial swelling and sore throat.   Respiratory: Positive for shortness of breath. Negative for cough.   Cardiovascular: Positive for leg swelling. Negative for chest pain.  Gastrointestinal: Negative for abdominal pain, nausea and vomiting.  Genitourinary: Negative for dysuria.  Musculoskeletal: Negative for back pain.  Skin: Negative for rash and wound.  Neurological: Negative for headaches.  Psychiatric/Behavioral: The patient is not nervous/anxious.      Physical Exam Updated Vital Signs BP 124/85   Pulse (!) 119   Temp 98.1 F (36.7 C) (Oral)   Resp (!) 23   Ht 5\' 7"  (1.702 m)   Wt 49.9 kg   SpO2 99%   BMI 17.23 kg/m   Physical Exam Vitals signs and nursing note reviewed.  Constitutional:      General: She is not in acute distress.    Appearance: She is well-developed. She is not diaphoretic.  HENT:     Head: Normocephalic and atraumatic.     Mouth/Throat:     Pharynx: No oropharyngeal exudate.  Eyes:     General: No scleral  icterus.       Right eye: No discharge.        Left eye: No discharge.     Conjunctiva/sclera: Conjunctivae normal.     Pupils: Pupils are equal, round, and reactive to light.  Neck:     Musculoskeletal: Normal range of motion and neck supple.     Thyroid: No thyromegaly.  Cardiovascular:     Rate and Rhythm: Normal rate and regular rhythm.     Heart sounds: Normal heart sounds. No murmur. No friction rub. No  gallop.   Pulmonary:     Effort: Pulmonary effort is normal. No respiratory distress.     Breath sounds: No stridor. Decreased breath sounds present. No wheezing or rales.     Comments: 2L Abdominal:     General: Bowel sounds are normal. There is no distension.     Palpations: Abdomen is soft.     Tenderness: There is no abdominal tenderness. There is no guarding or rebound.  Musculoskeletal:     Right lower leg: Edema present.     Left lower leg: Edema present.     Comments: 2+ pitting edema to bilateral feet; mild edema noted to bilateral lower extremities, left worse than right; patient states that this is baseline  Lymphadenopathy:     Cervical: No cervical adenopathy.  Skin:    General: Skin is warm and dry.     Coloration: Skin is not pale.     Findings: No rash.  Neurological:     Mental Status: She is alert.     Coordination: Coordination normal.      ED Treatments / Results  Labs (all labs ordered are listed, but only abnormal results are displayed) Labs Reviewed  BASIC METABOLIC PANEL - Abnormal; Notable for the following components:      Result Value   Glucose, Bld 101 (*)    GFR calc non Af Amer 55 (*)    All other components within normal limits  CBC WITH DIFFERENTIAL/PLATELET - Abnormal; Notable for the following components:   RBC 3.56 (*)    Hemoglobin 10.7 (*)    HCT 34.5 (*)    All other components within normal limits  BRAIN NATRIURETIC PEPTIDE - Abnormal; Notable for the following components:   B Natriuretic Peptide 1,259.5 (*)    All other  components within normal limits  PROTIME-INR - Abnormal; Notable for the following components:   Prothrombin Time 23.1 (*)    INR 2.1 (*)    All other components within normal limits  SARS CORONAVIRUS 2 (HOSPITAL ORDER[facility])  TROPONIN I (HIGH SENSITIVITY)  TROPONIN I (HIGH SENSITIVITY)    EKG EKG Interpretation  Date/Time:  Tuesday July 05 2019 08:15:08 EDT Ventricular Rate:  111 PR Interval:    QRS Duration: 156 QT Interval:  400 QTC Calculation: 544 R Axis:   -93 Text Interpretation:  Sinus tachycardia Atrial premature complex IVCD, consider atypical RBBB Inferior infarct, old Old LBBB. reversal of prior lateral t wave inversions Confirmed by Arby BarrettePfeiffer, Marcy [phone]([id number]) on 07/05/2019 8:22:27 AM Also confirmed by Arby BarrettePfeiffer, Marcy [phone]), editor Barbette Hairassel, Kerry [phone])  on 07/05/2019 8:54:10 AM   Radiology Dg Chest Portable 1 View  Result Date: 07/05/2019 CLINICAL DATA:  Shortness of breath for 4 days. Peripheral edema. EXAM: PORTABLE CHEST 1 VIEW COMPARISON:  09/29/2018 FINDINGS: Cardiomegaly shows no significant change. Transvenous pacemaker remains in appropriate position. Diffuse interstitial infiltrates are seen consistent with mild interstitial edema. Increased moderate to large right pleural effusion is seen with right lung atelectasis. IMPRESSION: Increased diffuse interstitial infiltrates, consistent with interstitial edema. Stable cardiomegaly. Increased moderate to large right pleural effusion, with right lung atelectasis. Electronically Signed   By: Danae OrleansJohn A Stahl M.D.   On: 07/05/2019 08:54    Procedures Procedures (including critical care time)  Medications Ordered in ED Medications  albuterol (PROVENTIL) (2.5 MG/3ML) 0.083% nebulizer solution 5 mg (5 mg Nebulization Not Given 07/05/19 0823)  sodium chloride flush (NS) 0.9 % injection 3 mL (has no administration in time range)  acetaminophen (TYLENOL) tablet 650 mg (has no administration in  time range)    Or  acetaminophen (TYLENOL) suppository 650 mg (has no administration in time range)  furosemide (LASIX) injection 80 mg (has no administration in time range)  diltiazem (CARDIZEM CD) 24 hr capsule 180 mg (has no administration in time range)  metoprolol tartrate (LOPRESSOR) tablet 25 mg (has no administration in time range)  diphenoxylate-atropine (LOMOTIL) 2.5-0.025 MG per tablet 1 tablet (has no administration in time range)  loperamide (IMODIUM) capsule 4 mg (has no administration in time range)  potassium chloride SA (K-DUR) CR tablet 20 mEq (has no administration in time range)  hydroxypropyl methylcellulose / hypromellose (ISOPTO TEARS / GONIOVISC) 2.5 % ophthalmic solution 1 drop (has no administration in time range)  Warfarin - Pharmacist Dosing Inpatient (has no administration in time range)  warfarin (COUMADIN) tablet 2.5 mg (has no administration in time range)  cholestyramine (QUESTRAN) packet 4 g (has no administration in time range)  furosemide (LASIX) injection 80 mg (80 mg Intravenous Given 07/05/19 1010)     Initial Impression / Assessment and Plan / ED Course  I have reviewed the triage vital signs and the nursing notes.  Pertinent labs & imaging results that were available during my care of the patient were reviewed by me and considered in my medical decision making (see chart for details).        Patient presenting with worsening shortness of breath and leg swelling over the past few days.  CBC and BMP is baseline for the patient.  BNP is elevated at 1259.5.  Chest x-ray shows increased interstitial edema as well as increased moderate to large right pleural effusion.  IV Lasix ordered in the ED.  EKG shows a sinus tachycardia, paced.  She has had some nonsustained episodes of V. tach, however it is unclear the morphology considering pacer.  I discussed patient case with the internal medicine teaching service who accepts patient for admission.  I appreciate  their assistance with the patient.  Patient also evaluated by my attending, Dr. Donnald GarrePfeiffer, who guided the patient's management and agrees with plan.  Final Clinical Impressions(s) / ED Diagnoses   Final diagnoses:  Dyspnea  Acute on chronic systolic congestive heart failure [facility](HCC)    ED Discharge Orders    None       Emi HolesLaw, Coy Rochford M, PA-C 07/05/19 1353    Arby BarrettePfeiffer, Marcy, MD 07/27/19 1205

## 2019-07-05 NOTE — ED Notes (Signed)
Admitting MD at bedside.

## 2019-07-05 NOTE — ED Notes (Signed)
Echo at bedside

## 2019-07-05 NOTE — Consult Note (Addendum)
Cardiology Consultation:   Patient ID: Carol Snyder MRN: [id number]; DOB: December 27, 1928  Admit date: 07/05/2019 Date of Consult: 07/05/2019  Primary Care Provider: Kaleen Mask, MD Primary Cardiologist: Donato Schultz, MD  Primary Electrophysiologist:  None    Patient Profile:   Carol Snyder is a 85 y.o. female with a history of chronic diastolic CHF with recurrent bilateral pleural effusion s/p thoracentesis in 06/2018 and 09/2018, persistent atrial fibrillation on Coumadin, sinus node dysfunction s/p Biotronik PPM, prior CVA, and orthostatic hypotension who is being seen today for the evaluation of CHF at the request of Dr. Mikey Bussing.  History of Present Illness:   Carol Snyder is a 85 year old female with the above history who is followed by Dr. Anne Fu. Patient last seen by Norma Fredrickson, NP, for virtual visit in 03/2019 at which time she was doing relatively well from a cardiac standpoint.   Patient presented to the ED via EMS today for evaluation of shortness of breath. Patient reports she was in her usual state of health until Saturday when she started to develop shortness of breath at rest and with exertion as well as some orthopnea and PND. She notes a little lower extremity edema but denies any weight gain and states she has actually lost about 15-20 lbs over the last year. Patient notes some palpitations over the past year (which she has not had in a long time) but denies any chest pain, lightheadedness, dizziness, or near syncope/syncope. Patient has chronic diarrhea but denies any abdominal pain, nausea, or vomiting. She also reports post nasal drainage and cough but states she has had this since before the COVID pandemic started. No recent fever, chills, body aches, or illnesses. No known exposures to COVID. Patient had a little bit of bruising on lower extremities a couple of weeks ago and occasionally has some blood in her stools which she states is from hemorrhoid but no other abnormal  bleeding/bruising.   In the ED, patient mildly tachycardic and tachypneic but vitals stable. EKG showed V-paced rhythm with no acute changes compared to prior tracings. High-sensitivity troponin negative x2. Chest x-ray showed stable cardiomegaly increased diffuse interstitial infiltrates consistent with interstitial edema as well as increased moderate to large right pleural effusion with right lung atelectasis. BNP significantly elevated at 1,259.5. WBC 6.2, Hgb 10.7, Plts 169. Na 136, K 3.9, Glucose 101, SCr 0.93. COVID-19 testing negative. Patient received IV Lasix and albuterol in the ED and was admitted for further work-up. Patient underwent imaged-guided right thoracentesis which yielded 1.35 L of clear gold fluid.   At the time of this evaluation, patient thinks she is breathing a little better than before after thoracentesis and IV Lasix.  Heart Pathway Score:     Past Medical History:  Diagnosis Date   Anemia    Aortic insufficiency    Chest pain    Chronic diarrhea    DNR (do not resuscitate)    Falling    History of GI bleed    History of shingles    Long term current use of anticoagulant    Mitral regurgitation    Orthostatic hypotension    Persistent atrial fibrillation    on Coumadin   Pleural effusion    Sinus node dysfunction (HCC)    a. s/p Biotronik PPM.   Stroke [facility])    Tricuspid regurgitation     Past Surgical History:  Procedure Laterality Date   APPENDECTOMY     CHOLECYSTECTOMY     with partial  colectomy do to gallbadder infection    IR THORACENTESIS ASP PLEURAL SPACE W/IMG GUIDE  06/22/2018   IR THORACENTESIS ASP PLEURAL SPACE W/IMG GUIDE  09/29/2018   IR THORACENTESIS ASP PLEURAL SPACE W/IMG GUIDE  07/05/2019   PACEMAKER IMPLANT  06/04/2016   BTK Edora 8 DR-T implanted by Dr Rudolpho SevinAkbary at [facility] for sick sinus syndrome     Home Medications:  Prior to Admission medications   Medication Sig Start Date End Date Taking?  Authorizing Provider  acetaminophen (TYLENOL) 325 MG tablet Take 650 mg by mouth every 4 (four) hours as needed for mild pain or fever (or if temp is 99.0 or greater for 72 hours.).   Yes [provider]  alendronate (FOSAMAX) 70 MG tablet Take 70 mg by mouth once a week. Sunday   Yes [provider]  cholestyramine (QUESTRAN) 4 g packet Take 4 g by mouth 3 (three) times daily with meals.   Yes [provider]  diltiazem (CARDIZEM CD) 180 MG 24 hr capsule Take 1 capsule (180 mg total) by mouth daily. 10/29/18  Yes Jake BatheSkains, Mark C, MD  diphenoxylate-atropine (LOMOTIL) 2.5-0.025 MG tablet Take 1 tablet by mouth 4 (four) times daily as needed for diarrhea or loose stools. 06/09/18  Yes Tyrone NineGrunz, Ryan B, MD  furosemide (LASIX) 80 MG tablet Take 80 mg by mouth daily.    Yes [provider]  hydroxypropyl methylcellulose / hypromellose (ISOPTO TEARS / GONIOVISC) 2.5 % ophthalmic solution Place 1 drop into both eyes 3 (three) times daily as needed for dry eyes.   Yes [provider]  loperamide (IMODIUM) 2 MG capsule Take 4 mg by mouth daily.    Yes [provider]  Menthol, Topical Analgesic, (BIOFREEZE EX) Apply 1 application topically daily as needed (pain).   Yes [provider]  metoprolol tartrate (LOPRESSOR) 25 MG tablet Take 1 tablet (25 mg total) by mouth 2 (two) times daily. 10/29/18  Yes Jake BatheSkains, Mark C, MD  potassium chloride SA (K-DUR,KLOR-CON) 20 MEQ tablet Take 1 tablet (20 mEq total) by mouth daily. 11/01/18  Yes Jake BatheSkains, Mark C, MD  warfarin (COUMADIN) 5 MG tablet Take 2.5-5 mg by mouth See admin instructions. Take 2.5 mg  All the other days at bedtime Take 5 mg on Sunday   Yes [provider]    Inpatient Medications: Scheduled Meds:  albuterol  5 mg Nebulization Once   cholestyramine  4 g Oral Daily   diltiazem  180 mg Oral Daily   furosemide  80 mg Intravenous BID   lidocaine       loperamide  4 mg Oral Daily     metoprolol tartrate  25 mg Oral BID   potassium chloride SA  20 mEq Oral Daily   sodium chloride flush  3 mL Intravenous Q12H   warfarin  2.5 mg Oral ONCE-1800   Warfarin - Pharmacist Dosing Inpatient   Does not apply q1800   Continuous Infusions:  PRN Meds: acetaminophen **OR** acetaminophen, diphenoxylate-atropine, hydroxypropyl methylcellulose / hypromellose, lidocaine (PF)  Allergies:    Allergies  Allergen Reactions   Codeine Other (See Comments)    unknown   Enbrel [Etanercept] Other (See Comments)    unknown   Fosamax [Alendronate Sodium] Other (See Comments)    unknown   Hydromorphone Other (See Comments)    unknown   Motrin [Ibuprofen] Other (See Comments)    Due to blood thinner    Sulfa Antibiotics Other (See Comments)    Childhood allergy  Voltaren [Diclofenac] Other (See Comments)    unknown   Naproxen Sodium Palpitations    Social History:   Social History   Socioeconomic History   Marital status: Single    Spouse name: Not on file   Number of children: Not on file   Years of education: Not on file   Highest education level: Not on file  Occupational History   Not on file  Social Needs   Financial resource strain: Not on file   Food insecurity    Worry: Not on file    Inability: Not on file   Transportation needs    Medical: Not on file    Non-medical: Not on file  Tobacco Use   Smoking status: Former Smoker    Quit date: 11/17/2004    Years since quitting: 14.6   Smokeless tobacco: Never Used  Substance and Sexual Activity   Alcohol use: No   Drug use: No   Sexual activity: Not on file  Lifestyle   Physical activity    Days per week: Not on file    Minutes per session: Not on file   Stress: Not on file  Relationships   Social connections    Talks on phone: Not on file    Gets together: Not on file    Attends religious service: Not on file    Active member of club or organization: Not on file     Attends meetings of clubs or organizations: Not on file    Relationship status: Not on file   Intimate partner violence    Fear of current or ex partner: Not on file    Emotionally abused: Not on file    Physically abused: Not on file    Forced sexual activity: Not on file  Other Topics Concern   Not on file  Social History Narrative   Not on file    Family History:    Family History  Problem Relation Age of Onset   Heart attack Father    Hypertension Father    Stroke Mother    Cancer Brother        lung   Arrhythmia Daughter        heart palpitations     ROS:  Please see the history of present illness.  Review of Systems  Constitutional: Positive for weight loss. Negative for chills and fever.  HENT: Positive for congestion (post nasal drainage).   Respiratory: Positive for cough and shortness of breath. Negative for hemoptysis.   Cardiovascular: Positive for palpitations, orthopnea, leg swelling and PND. Negative for chest pain.  Gastrointestinal: Positive for blood in stool and diarrhea. Negative for abdominal pain, nausea and vomiting.  Genitourinary: Negative for hematuria.  Musculoskeletal: Positive for joint pain. Negative for falls and myalgias.  Neurological: Negative for dizziness and loss of consciousness.  Endo/Heme/Allergies: Bruises/bleeds easily.  Psychiatric/Behavioral: Negative for substance abuse.    Physical Exam/Data:   Vitals:   07/05/19 1406 07/05/19 1515 07/05/19 1530 07/05/19 1626  BP: (!) 128/98  108/75 113/73  Pulse: (!) 120 (!) 105 (!) 110 (!) 103  Resp: (!) 29 17 17 20   Temp:    97.7 F (36.5 C)  TempSrc:    Oral  SpO2: 98% 100% 100% 100%  Weight:      Height:        Intake/Output Summary (Last 24 hours) at 07/05/2019 1640 Last data filed at 07/05/2019 1521 Gross per 24 hour  Intake --  Output 1000 ml  Net -1000 ml   Last 3 Weights 07/05/2019 05/07/2019 03/28/2019  Weight (lbs) 110 lb 110 lb 110 lb  Weight (kg) 49.896 kg  49.896 kg 49.896 kg     Body mass index is 17.23 kg/m.  General: Elderly frail female resting comfortably in no acute distress. HEENT: Normocephalic and atraumatic. Sclera clear.  Neck: Supple. No carotid bruits. JVD elevated. Heart: Mildly tachycardic with regular rhythm. Distinct S1 and S2. Soft murmur noted. No gallops or rubs. Radial pulses 2+ and equal bilaterally. Lungs: No increased work of breathing. Mild crackles noted in bilateral bases. No wheezes or rhonchi. Abdomen: Soft, non-distended, and non-tender to palpation. Bowel sounds present. MSK: Grossly weak and frail. Extremities: Minimal to mild lower extremity edema of right leg. Patient states right leg is always bigger than left. Skin: Warm and dry. Neuro: No focal deficits. Psych: Normal affect. Responds appropriately.   EKG:  The EKG was personally reviewed and demonstrates:  V-paced rhythm with underlying regular rhythm and possible atypical RBBB. Difficult to assess for ischemia due to v-pacing. No significant changes compared to prior EKGs.  Telemetry:  Telemetry was personally reviewed and demonstrates: V-paced rhythm with what looks like underlying atrial fibrillation with rates in the low 100's to 120's.   Relevant CV Studies: Echocardiogram 07/05/2019: Impressions: 1. The left ventricle has severely reduced systolic function, with an ejection fraction of 25-30%. The cavity size was normal. Left ventricular diastolic function could not be evaluated secondary to atrial fibrillation. Left ventricular diffuse  hypokinesis.  2. Severe akinesis of the left ventricular, entire inferior wall.  3. The right ventricle has normal systolic function. The cavity was moderately enlarged. There is no increase in right ventricular wall thickness.  4. Left atrial size was severely dilated.  5. Right atrial size was severely dilated.  6. Large pleural effusion in the right lateral region.  7. The mitral valve is abnormal. Moderate  thickening of the mitral valve leaflet. Mitral valve regurgitation is moderate to severe by color flow Doppler.  8. The tricuspid valve is grossly normal. Tricuspid valve regurgitation is mild-moderate.  9. The aortic valve is tricuspid. Mild calcification of the aortic valve. Aortic valve regurgitation is mild by color flow Doppler. Mild-moderate stenosis of the aortic valve. 10. The aorta is normal unless otherwise noted. 11. The inferior vena cava was dilated in size with <50% respiratory variability.  Summary: LVEF 25-30%, severe global hypokinesis with inferior akinesis, moderate RVE with mildly reduced RV systolic function, RV pacer wires noted, severe biatrial enlargement, MV thickening with moderate to severe MR, calcified trileaflet aortic valve with mild to moderate aortic stenosis and mild AI, mild to moderate TR, RVSP 38 mmHg, dilated IVC, large Right pleural effusion  Laboratory Data:  High Sensitivity Troponin:   Recent Labs  Lab 07/05/19 0830 07/05/19 1109  TROPONINIHS 8 7     Cardiac EnzymesNo results for input(s): TROPONINI in the last 168 hours. No results for input(s): TROPIPOC in the last 168 hours.  Chemistry Recent Labs  Lab 07/05/19 0830  NA 136  K 3.9  CL 102  CO2 24  GLUCOSE 101*  BUN 13  CREATININE 0.93  CALCIUM 9.2  GFRNONAA 55*  GFRAA >60  ANIONGAP 10    No results for input(s): PROT, ALBUMIN, AST, ALT, ALKPHOS, BILITOT in the last 168 hours. Hematology Recent Labs  Lab 07/05/19 0830  WBC 6.2  RBC 3.56*  HGB 10.7*  HCT 34.5*  MCV 96.9  MCH 30.1  MCHC 31.0  RDW  13.2  PLT 169   BNP Recent Labs  Lab 07/05/19 0830  BNP 1,259.5*    DDimer No results for input(s): DDIMER in the last 168 hours.   Radiology/Studies:  Dg Chest 1 View  Result Date: 07/05/2019 CLINICAL DATA:  85 year old female status post right-sided thoracentesis EXAM: CHEST  1 VIEW COMPARISON:  Prior chest x-ray obtained earlier today FINDINGS: Interval  right-sided thoracentesis with significant reduction in volume of right pleural effusion. No evidence of pneumothorax. Stable cardiomegaly with a left subclavian approach cardiac rhythm maintenance device in place. Atherosclerotic calcifications are present throughout the aorta. A nonspecific cluster of nodular opacities is present in the periphery of the right upper lobe. No pulmonary nodules are evident on the prior chest CT from June 22, 2018. Chronic bronchitic changes and bibasilar atelectasis are noted. No acute osseous abnormality. IMPRESSION: 1. Negative for pneumothorax or other complication following right-sided thoracentesis. 2. Nonspecific cluster of nodular opacities in the periphery of the right lung apex. Recommend further evaluation with dedicated PA and lateral chest x-ray. 3. Cardiomegaly and aortic atherosclerosis. Electronically Signed   By: Malachy MoanHeath  McCullough M.D.   On: 07/05/2019 15:04   Dg Chest Portable 1 View  Result Date: 07/05/2019 CLINICAL DATA:  Shortness of breath for 4 days. Peripheral edema. EXAM: PORTABLE CHEST 1 VIEW COMPARISON:  09/29/2018 FINDINGS: Cardiomegaly shows no significant change. Transvenous pacemaker remains in appropriate position. Diffuse interstitial infiltrates are seen consistent with mild interstitial edema. Increased moderate to large right pleural effusion is seen with right lung atelectasis. IMPRESSION: Increased diffuse interstitial infiltrates, consistent with interstitial edema. Stable cardiomegaly. Increased moderate to large right pleural effusion, with right lung atelectasis. Electronically Signed   By: Danae OrleansJohn A Stahl M.D.   On: 07/05/2019 08:54   Ir Thoracentesis Asp Pleural Space W/img Guide  Result Date: 07/05/2019 INDICATION: Patient with history of HF exacerbation, dyspnea, and recurrent right pleural effusion. Request is made for therapeutic right thoracentesis. EXAM: ULTRASOUND GUIDED THERAPEUTIC RIGHT THORACENTESIS MEDICATIONS: 10 mL 1%  lidocaine COMPLICATIONS: None immediate. PROCEDURE: An ultrasound guided thoracentesis was thoroughly discussed with the patient and questions answered. The benefits, risks, alternatives and complications were also discussed. The patient understands and wishes to proceed with the procedure. Written consent was obtained. Ultrasound was performed to localize and mark an adequate pocket of fluid in the right chest. The area was then prepped and draped in the normal sterile fashion. 1% Lidocaine was used for local anesthesia. Under ultrasound guidance a 6 Fr Safe-T-Centesis catheter was introduced. Thoracentesis was performed. The catheter was removed and a dressing applied. FINDINGS: A total of approximately 1.35 L of clear gold fluid was removed. IMPRESSION: Successful ultrasound guided right thoracentesis yielding 1.35 L of pleural fluid. Read by: Elwin MochaAlexandra Louk, PA-C Electronically Signed   By: Gilmer MorJaime  Wagner D.O.   On: 07/05/2019 15:22    Assessment and Plan:    Acute on Chronic Combined CHF - Patient presented with shortness of breath, orthopnea, and PND. - Chest x-ray showed stable cardiomegaly increased diffuse interstitial infiltrates consistent with interstitial edema as well as increased moderate to large right pleural effusion with right lung atelectasis.  - BNP significantly elevated at 1,259.5. - Echo showed LVEF of 25-30% with diffuse hypokinesis and severe akinesis of the entire inferior wall of the LV. RV also noted to be moderately enlarged but systolic function normal. See full report above. Reduced EF and wall motion abnormality new from last Echo in 05/2018. - Patient started on IV Lasix 80mg  twice daily with  good urinary response. Documented urinary output of 1 L since then. - Will decrease IV Lasix to 40mg  twice daily. - Patient denies any angina. High-sensitivity troponin negative x2. Do not suspect that we will pursue any invasive ischemic work-up in this frail 85 year old woman. Will  likely treat medically. Will discontinue home Cardizem given reduced EF and transition home Lopressor to Toprol. Will hold off on adding a ACEi/ARB at this time due to history of orthostatic hypotension and soft Bps at times. - Continue to monitor daily weight, strict I/O's, and renal function.  Recurrent Pleural Effusion - Patient has history of recurrent pleural effusion s/p thoracentesis x2. - Chest x-ray on admission showed moderate to large right pleural effusion with right lung atelectasis. - Patient underwent image-guided right thoracentesis earlier today which yielded 1.35 L of clear gold fluid. Repeat chest x-ray negative for pneumothorax. - Continue diuresis as above.  Persistent Atrial Fibrillation/ Sinus Node Dysfunction s/p PPM - Last remote device check was on 04/21/2019.  - Telemetry shows v-paced rhythm with what looks like underlying atrial fibrillation with rates in the 100's to 120's.  - Will discontinue home Cardizem and transition home Lopressor to Toprol 100mg  daily. - Continue chronic anticoagulation with Coumadin. Dosing per pharmacy.   Otherwise, per primary team.   For questions or updates, please contact [facility] Please consult [email or web address] for contact info under     Signed, Corrin Parker, PA-C  07/05/2019 4:40 PM   I have personally seen and examined this patient with Marjie Skiff, PA-C. I agree with the assessment and plan as outlined above.  Carol Snyder is a pleasant 85 yo female with history of chronic diastolic CHF on Lasix, persistent atrial fib on coumadin, recurrent pleural effusions, sinus node dysfunction s/p PPM admitted with volume overload and recurrent pleural effusion. She is now s/p right thoracentesis today and has been diuresed with IV Lasix.  She is feeling symptomatically improved in regards to her dyspnea.  Echo today with LVEF=25-30%, mild to moderate AS and moderate to severe MR. Her LV systolic function was normal in 2019.   Troponin is negative.  EKG reviewed by me shows paced rhythm, atrial fib  My exam:  General: Thin, frail, elderly female in NAD   HEENT: OP clear, mucus membranes moist  SKIN: warm, dry. No rashes. Neuro: No focal deficits  Musculoskeletal: Muscle strength 5/5 all ext  Psychiatric: Mood and affect normal  Neck: Mild JVD, no carotid bruits, no thyromegaly, no lymphadenopathy.  Lungs:Clear bilaterally, no wheezes, rhonci, crackles Cardiovascular: Irreg irreg. Systolic murmur noted.  Abdomen:Soft. Bowel sounds present. Non-tender.  Extremities: No lower extremity edema. Pulses are 2 + in the bilateral DP/PT.  Plan:  New LV systolic dysfunction with acute on chronic combined CHF and persistent atrial fib:   She has an inferior wall motion abnormality on her echo which could represent a prior MI but she has no evidence of ACS now and now chest pain. She is not an optimal candidate for cath given her frail state. She is fully anti-coagulated on coumadin. I would favor medical management of her cardiomyopathy. Will d/c Cardizem given new LV systolic dysfunction. Will change Lopressor to Toprol today and hope for good rate control. I would consider Coreg but she has soft blood pressures at baseline.  Continue IV Lasix but will lower to 40 mg IV BID.  Plan reviewed with pt and her daughter at the bedside.   We will follow with you.   Verne Carrow  07/05/2019 6:13 PM

## 2019-07-05 NOTE — ED Notes (Signed)
Pt to iIR

## 2019-07-05 NOTE — ED Notes (Signed)
Pt used bedside commode. Pts urine out put was 50 ml.

## 2019-07-05 NOTE — Progress Notes (Signed)
  Echocardiogram 2D Echocardiogram has been performed.  Carol Snyder 07/05/2019, 1:50 PM

## 2019-07-05 NOTE — ED Notes (Signed)
Dr Hoffman at bedside

## 2019-07-05 NOTE — ED Notes (Signed)
Pt requested foam dressing to coccyx area. Notified Millie, RN of pt request. Tech applied dressing.

## 2019-07-05 NOTE — Progress Notes (Signed)
Patients HR is ranging from 116-130s. Pt HR also went up to 160 non-sustaining. Pt complained of feeling sob and epigastric pain. Placed pt on 2L of O2. Paged MD and gave orders to give missed dose of metoprolol and to follow up in about an hour or if patient's condition does not change. Will continue to monitor.

## 2019-07-05 NOTE — Progress Notes (Signed)
ANTICOAGULATION CONSULT NOTE - Initial Consult  Pharmacy Consult for warfarin Indication: atrial fibrillation  Patient Measurements: Height: 5\' 7"  (170.2 cm) Weight: 110 lb (49.9 kg) IBW/kg (Calculated) : 61.6  Vital Signs: Temp: 98.1 F (36.7 C) (08/18 0816) Temp Source: Oral (08/18 0816) BP: 133/83 (08/18 1100) Pulse Rate: 116 (08/18 1100)  Labs: Recent Labs    07/05/19 0830  HGB 10.7*  HCT 34.5*  PLT 169  LABPROT 23.1*  INR 2.1*  CREATININE 0.93  TROPONINIHS 8    Estimated Creatinine Clearance: 32.9 mL/min (by C-G formula based on SCr of 0.93 mg/dL).   Medical History: Past Medical History:  Diagnosis Date  . Anemia   . Aortic insufficiency   . Chest pain   . Chronic diarrhea   . DNR (do not resuscitate)   . Falling   . History of GI bleed   . History of shingles   . Long term current use of anticoagulant   . Mitral regurgitation   . Orthostatic hypotension   . Persistent atrial fibrillation    on Coumadin  . Pleural effusion   . Sinus node dysfunction (HCC)    a. s/p Biotronik PPM.  . Stroke ([place])   . Tricuspid regurgitation    Assessment: 61 yof presented to the ED with shortness of breath. She is on chronic warfarin for history of afib. INR is therapeutic at 2.1. Hgb is low at 10.7 but no bleeding noted. Platelets are WNL.   Goal of Therapy:  INR 2-3 Monitor platelets by anticoagulation protocol: Yes   Plan:  Warfarin 2.5mg  PO x 1 tonight Daily INR  Ephraim Reichel, Rande Lawman 07/05/2019,11:15 AM

## 2019-07-05 NOTE — Progress Notes (Signed)
Pt 's heart rate has been tachy before she comes to the floor

## 2019-07-05 NOTE — ED Notes (Signed)
ED TO INPATIENT HANDOFF REPORT  ED Nurse Name and Phone #: Aldean JewettMillie [phone]   S Name/Age/Gender Carol Snyder 85 y.o. female Room/Bed: 034C/034C  Code Status   Code Status: DNR  Home/SNF/Other Home Patient oriented to: self, place, time and situation Is this baseline? Yes   Triage Complete: Triage complete  Chief Complaint SOB  Triage Note Pt arrives EMS from home [facility] and much worse last night. Swelling noted at feet over last week. Paced rhythm. Hx of CHF . Pt on 2l/ [place] o2 and sat 99%.    Allergies Allergies  Allergen Reactions  . Codeine Other (See Comments)    unknown  . Enbrel [Etanercept] Other (See Comments)    unknown  . Fosamax [Alendronate Sodium] Other (See Comments)    unknown  . Hydromorphone Other (See Comments)    unknown  . Motrin [Ibuprofen] Other (See Comments)    Due to blood thinner   . Sulfa Antibiotics Other (See Comments)    Childhood allergy   . Voltaren [Diclofenac] Other (See Comments)    unknown  . Naproxen Sodium Palpitations    Level of Care/Admitting Diagnosis ED Disposition    ED Disposition Condition Comment   Admit  [facility] [[id number]]  Level of Care: Telemetry Cardiac [103]  Covid Evaluation: Asymptomatic Screening Protocol (No Symptoms)  Diagnosis: CHF exacerbation [facility](HCC) [[id number]]) [[id number]]  Admitting Physician: Silvio PateHOFFMAN, ERIK C [[id number]]  Attending Physician: Gust RungHOFFMAN, ERIK C [[id number]]  Estimated length of stay: past midnight tomorrow  Certification:: I certify this patient will need inpatient services for at least 2 midnights  PT Class (Do Not Modify): Inpatient [101]  PT Acc Code (Do Not Modify): Private [1]       B Medical/Surgery History Past Medical History:  Diagnosis Date  . Anemia   . Aortic insufficiency   . Chest pain   . Chronic diarrhea   . DNR (do not resuscitate)   . Falling   . History of GI bleed   . History of shingles   . Long term current use of  anticoagulant   . Mitral regurgitation   . Orthostatic hypotension   . Persistent atrial fibrillation    on Coumadin  . Pleural effusion   . Sinus node dysfunction (HCC)    a. s/p Biotronik PPM.  . Stroke (HCC)   . Tricuspid regurgitation    Past Surgical History:  Procedure Laterality Date  . APPENDECTOMY    . CHOLECYSTECTOMY     with partial colectomy do to gallbadder infection   . IR THORACENTESIS ASP PLEURAL SPACE W/IMG GUIDE  06/22/2018  . IR THORACENTESIS ASP PLEURAL SPACE W/IMG GUIDE  09/29/2018  . PACEMAKER IMPLANT  06/04/2016   BTK [place] 8 DR-T implanted by Dr Rudolpho [facility]Akbary [facility] for sick sinus syndrome     A IV Location/Drains/Wounds Patient Lines/Drains/Airways Status   Active Line/Drains/Airways    None          Intake/Output Last 24 hours No intake or output data in the 24 hours ending 07/05/19 1518  Labs/Imaging Results for orders placed or performed during the hospital encounter of 07/05/19 (from the past 48 hour(s))  Basic metabolic panel     Status: Abnormal   Collection Time: 07/05/19  8:30 AM  Result Value Ref Range   Sodium 136 135 - 145 mmol/L   Potassium 3.9 3.5 - 5.1 mmol/L   Chloride 102 98 - 111 mmol/L   CO2  24 22 - 32 mmol/L   Glucose, Bld 101 (H) 70 - 99 mg/dL   BUN 13 8 - 23 mg/dL   Creatinine, Ser 1.610.93 0.44 - 1.00 mg/dL   Calcium 9.2 8.9 - 09.610.3 mg/dL   GFR calc non Af Amer 55 (L) >60 mL/min   GFR calc Af Amer >60 >60 mL/min   Anion gap 10 5 - 15    Comment: Performed at [facility], [address] [place], [place] [address]  CBC with Differential     Status: Abnormal   Collection Time: 07/05/19  8:30 AM  Result Value Ref Range   WBC 6.2 4.0 - 10.5 K/uL   RBC 3.56 (L) 3.87 - 5.11 MIL/uL   Hemoglobin 10.7 (L) 12.0 - 15.0 g/dL   HCT 98.134.5 (L) 19.136.0 - 47.846.0 %   MCV 96.9 80.0 - 100.0 fL   MCH 30.1 26.0 - 34.0 pg   MCHC 31.0 30.0 - 36.0 g/dL   RDW 29.513.2 62.111.5 - 30.815.5 %   Platelets 169 150 - 400 K/uL   nRBC 0.0 0.0 - 0.2  %   Neutrophils Relative % 66 %   Neutro Abs 4.1 1.7 - 7.7 K/uL   Lymphocytes Relative 17 %   Lymphs Abs 1.1 0.7 - 4.0 K/uL   Monocytes Relative 11 %   Monocytes Absolute 0.7 0.1 - 1.0 K/uL   Eosinophils Relative 5 %   Eosinophils Absolute 0.3 0.0 - 0.5 K/uL   Basophils Relative 1 %   Basophils Absolute 0.1 0.0 - 0.1 K/uL   Immature Granulocytes 0 %   Abs Immature Granulocytes 0.02 0.00 - 0.07 K/uL    Comment: Performed at [facility], [address] [place], [place] [address]  Brain natriuretic peptide     Status: Abnormal   Collection Time: 07/05/19  8:30 AM  Result Value Ref Range   B Natriuretic Peptide 1,259.5 (H) 0.0 - 100.0 pg/mL    Comment: Performed at [facility], [address] [place], [place] [address]  Protime-INR     Status: Abnormal   Collection Time: 07/05/19  8:30 AM  Result Value Ref Range   Prothrombin Time 23.1 (H) 11.4 - 15.2 seconds   INR 2.1 (H) 0.8 - 1.2    Comment: (NOTE) INR goal varies based on device and disease states. Performed at [facility], [address] [place], [place] [address]   Troponin I (High Sensitivity)     Status: None   Collection Time: 07/05/19  8:30 AM  Result Value Ref Range   Troponin I (High Sensitivity) 8 <18 ng/L    Comment: (NOTE) Elevated high sensitivity troponin I (hsTnI) values and significant  changes across serial measurements may suggest ACS but many other  chronic and acute conditions are known to elevate hsTnI results.  Refer to the "Links" section for chest pain algorithms and additional  guidance. Performed at [facility], [address] [place], [place] [address]   SARS Coronavirus 2 [facility](Hospital order, Performed in [facility]) Nasopharyngeal Nasopharyngeal Swab     Status: None   Collection Time: 07/05/19 10:15 AM   Specimen: Nasopharyngeal Swab  Result Value Ref Range   SARS Coronavirus 2 NEGATIVE NEGATIVE    Comment: (NOTE) If result is NEGATIVE SARS-CoV-2  target nucleic acids are NOT DETECTED. The SARS-CoV-2 RNA is generally detectable in upper and lower  respiratory specimens during the acute phase of infection. The lowest  concentration of SARS-CoV-2 viral copies this assay can detect is 250  copies / mL. A negative result does not preclude SARS-CoV-2 infection  and should not be used as the sole basis for treatment or other  patient management decisions.  A negative result may occur with  improper specimen collection / handling, submission of specimen other  than nasopharyngeal swab, presence of viral mutation(s) within the  areas targeted by this assay, and inadequate number of viral copies  (<250 copies / mL). A negative result must be combined with clinical  observations, patient history, and epidemiological information. If result is POSITIVE SARS-CoV-2 target nucleic acids are DETECTED. The SARS-CoV-2 RNA is generally detectable in upper and lower  respiratory specimens dur ing the acute phase of infection.  Positive  results are indicative of active infection with SARS-CoV-2.  Clinical  correlation with patient history and other diagnostic information is  necessary to determine patient infection status.  Positive results do  not rule out bacterial infection or co-infection with other viruses. If result is PRESUMPTIVE POSTIVE SARS-CoV-2 nucleic acids MAY BE PRESENT.   A presumptive positive result was obtained on the submitted specimen  and confirmed on repeat testing.  While 2019 novel coronavirus  (SARS-CoV-2) nucleic acids may be present in the submitted sample  additional confirmatory testing may be necessary for epidemiological  and / or clinical management purposes  to differentiate between  SARS-CoV-2 and other Sarbecovirus currently known to infect humans.  If clinically indicated additional testing with an alternate test  methodology [phone]) is advised. The SARS-CoV-2 RNA is generally  detectable in upper and lower  respiratory sp ecimens during the acute  phase of infection. The expected result is Negative. Fact Sheet for Patients:  StrictlyIdeas.no Fact Sheet for Healthcare Providers: BankingDealers.co.za This test is not yet approved or cleared by the [place] FDA and has been authorized for detection and/or diagnosis of SARS-CoV-2 by FDA under an Emergency Use Authorization (EUA).  This EUA will remain in effect (meaning this test can be used) for the duration of the COVID-19 declaration under Section 564(b)(1) of the Act, 21 U.S.C. section 360bbb-3(b)(1), unless the authorization is terminated or revoked sooner. Performed at [facility], [place] [address] [place], [place] [address]   Troponin I (High Sensitivity)     Status: None   Collection Time: 07/05/19 11:09 AM  Result Value Ref Range   Troponin I (High Sensitivity) 7 <18 ng/L    Comment: (NOTE) Elevated high sensitivity troponin I (hsTnI) values and significant  changes across serial measurements may suggest ACS but many other  chronic and acute conditions are known to elevate hsTnI results.  Refer to the "Links" section for chest pain algorithms and additional  guidance. Performed at [facility], [place] [address] [place], [place] [address]    Dg Chest 1 View  Result Date: 07/05/2019 CLINICAL DATA:  85 year old female status post right-sided thoracentesis EXAM: CHEST  1 VIEW COMPARISON:  Prior chest x-ray obtained earlier today FINDINGS: Interval right-sided thoracentesis with significant reduction in volume of right pleural effusion. No evidence of pneumothorax. Stable cardiomegaly with a left subclavian approach cardiac rhythm maintenance device in place. Atherosclerotic calcifications are present throughout the aorta. A nonspecific cluster of nodular opacities is present in the periphery of the right upper lobe. No pulmonary nodules are evident on the prior chest CT from  June 22, 2018. Chronic bronchitic changes and bibasilar atelectasis are noted. No acute osseous abnormality. IMPRESSION: 1. Negative for pneumothorax  or other complication following right-sided thoracentesis. 2. Nonspecific cluster of nodular opacities in the periphery of the right lung apex. Recommend further evaluation with dedicated PA and lateral chest x-ray. 3. Cardiomegaly and aortic atherosclerosis. Electronically Signed   By: Malachy MoanHeath  McCullough M.D.   On: 07/05/2019 15:04   Dg Chest Portable 1 View  Result Date: 07/05/2019 CLINICAL DATA:  Shortness of breath for 4 days. Peripheral edema. EXAM: PORTABLE CHEST 1 VIEW COMPARISON:  09/29/2018 FINDINGS: Cardiomegaly shows no significant change. Transvenous pacemaker remains in appropriate position. Diffuse interstitial infiltrates are seen consistent with mild interstitial edema. Increased moderate to large right pleural effusion is seen with right lung atelectasis. IMPRESSION: Increased diffuse interstitial infiltrates, consistent with interstitial edema. Stable cardiomegaly. Increased moderate to large right pleural effusion, with right lung atelectasis. Electronically Signed   By: Danae OrleansJohn A Stahl M.D.   On: 07/05/2019 08:54    Pending Labs Unresulted Labs (From admission, onward)    Start     Ordered   07/06/19 0500  Basic metabolic panel  Tomorrow morning,   R     07/05/19 1106   07/06/19 0500  Protime-INR  Daily,   R     07/05/19 1115          Vitals/Pain Today's Vitals   07/05/19 1345 07/05/19 1400 07/05/19 1406 07/05/19 1515  BP:  125/79 (!) 128/98   Pulse: (!) 125 (!) 119 (!) 120 (!) 105  Resp: 19 (!) 24 (!) 29 17  Temp:      TempSrc:      SpO2: 97% 97% 98% 100%  Weight:      Height:      PainSc:        Isolation Precautions No active isolations  Medications Medications  albuterol (PROVENTIL) (2.5 MG/3ML) 0.083% nebulizer solution 5 mg (5 mg Nebulization Not Given 07/05/19 0823)  sodium chloride flush (NS) 0.9 %  injection 3 mL (has no administration in time range)  acetaminophen (TYLENOL) tablet 650 mg (has no administration in time range)    Or  acetaminophen (TYLENOL) suppository 650 mg (has no administration in time range)  furosemide (LASIX) injection 80 mg (has no administration in time range)  diltiazem (CARDIZEM CD) 24 hr capsule 180 mg (has no administration in time range)  metoprolol tartrate (LOPRESSOR) tablet 25 mg (25 mg Oral Given 07/05/19 1355)  diphenoxylate-atropine (LOMOTIL) 2.5-0.025 MG per tablet 1 tablet (has no administration in time range)  loperamide (IMODIUM) capsule 4 mg (4 mg Oral Given 07/05/19 1355)  potassium chloride SA (K-DUR) CR tablet 20 mEq (20 mEq Oral Given 07/05/19 1358)  hydroxypropyl methylcellulose / hypromellose (ISOPTO TEARS / GONIOVISC) 2.5 % ophthalmic solution 1 drop (has no administration in time range)  Warfarin - Pharmacist Dosing Inpatient (has no administration in time range)  warfarin (COUMADIN) tablet 2.5 mg (has no administration in time range)  cholestyramine (QUESTRAN) packet 4 g (has no administration in time range)  lidocaine (XYLOCAINE) 1 % (with pres) injection (has no administration in time range)  lidocaine (PF) (XYLOCAINE) 1 % injection (5 mLs Infiltration Given 07/05/19 1430)  furosemide (LASIX) injection 80 mg (80 mg Intravenous Given 07/05/19 1010)    Mobility walks with device Moderate fall risk   Focused Assessments Pulmonary Assessment Handoff:  Lung sounds: Bilateral Breath Sounds: Rhonchi L Breath Sounds: Rhonchi R Breath Sounds: Rhonchi O2 Device: Room Air O2 Flow Rate (L/min): 2 L/min      R Recommendations: See Admitting Provider Note  Report given to:   Additional  Notes:

## 2019-07-05 NOTE — Procedures (Signed)
PROCEDURE SUMMARY:  Successful image-guided right thoracentesis. Yielded 1.35 liters of clear gold fluid. Patient tolerated procedure well. No immediate complications. EBL = 0 mL.  Specimen was not sent for labs. CXR ordered.  Earley Abide PA-C 07/05/2019 2:49 PM

## 2019-07-05 NOTE — ED Notes (Signed)
Pt ate part of roll. Daughter remains at bedside and pt denies pain

## 2019-07-06 DIAGNOSIS — I5021 Acute systolic (congestive) heart failure: Secondary | ICD-10-CM

## 2019-07-06 DIAGNOSIS — I482 Chronic atrial fibrillation, unspecified: Secondary | ICD-10-CM

## 2019-07-06 DIAGNOSIS — I5043 Acute on chronic combined systolic (congestive) and diastolic (congestive) heart failure: Secondary | ICD-10-CM

## 2019-07-06 DIAGNOSIS — I509 Heart failure, unspecified: Secondary | ICD-10-CM

## 2019-07-06 DIAGNOSIS — Z950 Presence of cardiac pacemaker: Secondary | ICD-10-CM

## 2019-07-06 LAB — BASIC METABOLIC PANEL
Anion gap: 12 (ref 5–15)
BUN: 15 mg/dL (ref 8–23)
CO2: 26 mmol/L (ref 22–32)
Calcium: 8.9 mg/dL (ref 8.9–10.3)
Chloride: 102 mmol/L (ref 98–111)
Creatinine, Ser: 0.88 mg/dL (ref 0.44–1.00)
GFR calc Af Amer: >60 mL/min (ref >60)
GFR calc non Af Amer: 59 mL/min — ABNORMAL LOW (ref >60)
Glucose, Bld: 101 mg/dL — ABNORMAL HIGH (ref 70–99)
Potassium: 3.5 mmol/L (ref 3.5–5.1)
Sodium: 140 mmol/L (ref 135–145)

## 2019-07-06 LAB — PROTIME-INR
INR: 2.1 — ABNORMAL HIGH (ref 0.8–1.2)
Prothrombin Time: 22.8 seconds — ABNORMAL HIGH (ref 11.4–15.2)

## 2019-07-06 MED ORDER — DIGOXIN 125 MCG PO TABS
0.2500 mg | ORAL_TABLET | Freq: Three times a day (TID) | ORAL | Status: AC
  Administered 2019-07-06 – 2019-07-07 (×3): 0.25 mg via ORAL
  Filled 2019-07-06 (×3): qty 2

## 2019-07-06 MED ORDER — DIGOXIN 125 MCG PO TABS
0.0625 mg | ORAL_TABLET | Freq: Every day | ORAL | Status: DC
  Administered 2019-07-08 – 2019-07-09 (×2): 0.0625 mg via ORAL
  Filled 2019-07-06 (×2): qty 1

## 2019-07-06 MED ORDER — WARFARIN SODIUM 2.5 MG PO TABS
2.5000 mg | ORAL_TABLET | Freq: Once | ORAL | Status: AC
  Administered 2019-07-06: 2.5 mg via ORAL
  Filled 2019-07-06: qty 1

## 2019-07-06 NOTE — Consult Note (Addendum)
Cardiology Consultation:   Patient ID: Carol Snyder MRN: [id number]; DOB: [birth year]  Admit date: 07/05/2019 Date of Consult: 07/06/2019  Primary Care Provider: Kaleen Mask, MD Primary Cardiologist: Donato Schultz, MD Primary Electrophysiologist:  Dr. Johney Frame   Patient Profile:   Carol Snyder is a 85 y.o. female with a hx of permanent AFib, Sinus node dysfunction w/PPM, chronic CHF (diastolic), prior stroke, orthostatic hypotension, who is being seen today for the evaluation of pacer programming at the request of Dr. Bjorn Pippin.   Device information Biotronik dual chamber PPM implanted by Dr. Rudolpho Sevin, 06/04/16, for sinus node dysfunction Programmed VVI-CLS AAD: amiodarone stopped with progression to permanent AFib  History of Present Illness:   Carol Snyder las saw dr. Johney Frame in Dec 2019, pt reported progressive decline over a few years, difficulty with nutrition/failure to thrive, carrying DNR status .  Had h/o orthostatic symptoms that were improved, her afib progressed to permanent and a/c with warfarin (prior decline of OAC).  Had  No acute complaints issues, no changes were made to her programming or meds.  She had a tele health visit with Darrel Hoover, NP.  She notes historically has had trouble with diastolic failure, had thoracentesis August 2019 as well as 09/2018 (no malignancy found), she had no c/o SOB and no changes were made.  She was admitted to [facility] yesterday with c/o SOB, DOE< symptoms of orthopnea and PND as well, found to be in acute/chronic CHF, underwent  Thoracentesis (R) 1.35L clear gold fluid removed, and admitted, IV diuresis.  Updated TTE notes new reduction in LVEF 25-30%, observed to have unusual pacing behavior on telemetry with V pacing at rates to 120 range, and EP I s aksed to weigh in on her device programming.   The patient reports she feels well, denied to me that she ever felt SOB, though her daughter at bedisde reports today is a much better day and she  clearly is breathing better.  The patient denies any kin of CP, no dizzy spells, no near syncope or syncope. He daughter confirms she has h/o dizziness on standing with a tendency towards low BP.  LABS K+ 3.5 BUN/Creat 15/0.88 WBC 6.2 H/H 10/34 plts 169  INR 2.1   Device interrogation done today Battery and lead measurements are good VP 75% Note 18 HVR episodes starting 07/05/2019 (likely RVR w/CHF)   Heart Pathway Score:     Past Medical History:  Diagnosis Date   Anemia    Aortic insufficiency    Chest pain    Chronic diarrhea    DNR (do not resuscitate)    Falling    History of GI bleed    History of shingles    Long term current use of anticoagulant    Mitral regurgitation    Orthostatic hypotension    Persistent atrial fibrillation    on Coumadin   Pleural effusion    Sinus node dysfunction (HCC)    a. s/p Biotronik PPM.   Stroke (HCC)    Tricuspid regurgitation     Past Surgical History:  Procedure Laterality Date   APPENDECTOMY     CHOLECYSTECTOMY     with partial colectomy do to gallbadder infection    IR THORACENTESIS ASP PLEURAL SPACE W/IMG GUIDE  06/22/2018   IR THORACENTESIS ASP PLEURAL SPACE W/IMG GUIDE  09/29/2018   IR THORACENTESIS ASP PLEURAL SPACE W/IMG GUIDE  07/05/2019   PACEMAKER IMPLANT  06/04/2016   BTK Edora 8 DR-T implanted by Dr Rudolpho Sevin at [facility] for sick sinus syndrome     Home Medications:  Prior to Admission medications   Medication Sig Start Date End Date Taking? Authorizing Provider  acetaminophen (TYLENOL) 325 MG tablet Take 650 mg by mouth every 4 (four) hours as needed for mild pain or fever (or if temp is 99.0 or greater for 72 hours.).   Yes [provider]  alendronate (FOSAMAX) 70 MG tablet Take 70 mg by mouth once a week. Sunday   Yes [provider]  cholestyramine (QUESTRAN) 4 g packet Take 4 g by mouth 3 (three) times daily with meals.   Yes [provider]    diltiazem (CARDIZEM CD) 180 MG 24 hr capsule Take 1 capsule (180 mg total) by mouth daily. 10/29/18  Yes Jake BatheSkains, Mark C, MD  diphenoxylate-atropine (LOMOTIL) 2.5-0.025 MG tablet Take 1 tablet by mouth 4 (four) times daily as needed for diarrhea or loose stools. 06/09/18  Yes Tyrone NineGrunz, Ryan B, MD  furosemide (LASIX) 80 MG tablet Take 80 mg by mouth daily.    Yes [provider]  hydroxypropyl methylcellulose / hypromellose (ISOPTO TEARS / GONIOVISC) 2.5 % ophthalmic solution Place 1 drop into both eyes 3 (three) times daily as needed for dry eyes.   Yes [provider]  loperamide (IMODIUM) 2 MG capsule Take 4 mg by mouth daily.    Yes [provider]  Menthol, Topical Analgesic, (BIOFREEZE EX) Apply 1 application topically daily as needed (pain).   Yes [provider]  metoprolol tartrate (LOPRESSOR) 25 MG tablet Take 1 tablet (25 mg total) by mouth 2 (two) times daily. 10/29/18  Yes Jake BatheSkains, Mark C, MD  potassium chloride SA (K-DUR,KLOR-CON) 20 MEQ tablet Take 1 tablet (20 mEq total) by mouth daily. 11/01/18  Yes Jake BatheSkains, Mark C, MD  warfarin (COUMADIN) 5 MG tablet Take 2.5-5 mg by mouth See admin instructions. Take 2.5 mg  All the other days at bedtime Take 5 mg on Sunday   Yes [provider]    Inpatient Medications: Scheduled Meds:  albuterol  5 mg Nebulization Once   cholestyramine  4 g Oral Daily   furosemide  40 mg Intravenous BID   loperamide  4 mg Oral Daily   metoprolol succinate  100 mg Oral Daily   potassium chloride SA  20 mEq Oral Daily   sodium chloride flush  3 mL Intravenous Q12H   warfarin  2.5 mg Oral ONCE-1800   Warfarin - Pharmacist Dosing Inpatient   Does not apply q1800   Continuous Infusions:  PRN Meds: acetaminophen **OR** acetaminophen, diphenoxylate-atropine, hydroxypropyl methylcellulose / hypromellose, lidocaine (PF)  Allergies:    Allergies  Allergen Reactions   Codeine Other (See Comments)    unknown    Enbrel [Etanercept] Other (See Comments)    unknown   Fosamax [Alendronate Sodium] Other (See Comments)    unknown   Hydromorphone Other (See Comments)    unknown   Motrin [Ibuprofen] Other (See Comments)    Due to blood thinner    Sulfa Antibiotics Other (See Comments)    Childhood allergy    Voltaren [Diclofenac] Other (See Comments)    unknown   Naproxen Sodium Palpitations    Social History:   Social History   Socioeconomic History   Marital status: Single    Spouse name: Not on file   Number of children: Not on file   Years of education: Not on file   Highest education level: Not on file  Occupational History  Not on file  Social Needs   Financial resource strain: Not on file   Food insecurity    Worry: Not on file    Inability: Not on file   Transportation needs    Medical: Not on file    Non-medical: Not on file  Tobacco Use   Smoking status: Former Smoker    Quit date: 11/17/2004    Years since quitting: 14.6   Smokeless tobacco: Never Used  Substance and Sexual Activity   Alcohol use: No   Drug use: No   Sexual activity: Not on file  Lifestyle   Physical activity    Days per week: Not on file    Minutes per session: Not on file   Stress: Not on file  Relationships   Social connections    Talks on phone: Not on file    Gets together: Not on file    Attends religious service: Not on file    Active member of club or organization: Not on file    Attends meetings of clubs or organizations: Not on file    Relationship status: Not on file   Intimate partner violence    Fear of current or ex partner: Not on file    Emotionally abused: Not on file    Physically abused: Not on file    Forced sexual activity: Not on file  Other Topics Concern   Not on file  Social History Narrative   Not on file    Family History:   Family History  Problem Relation Age of Onset   Heart attack Father    Hypertension Father    Stroke  Mother    Cancer Brother        lung   Arrhythmia Daughter        heart palpitations     ROS:  Please see the history of present illness.  All other ROS reviewed and negative.     Physical Exam/Data:   Vitals:   07/06/19 0539 07/06/19 0740 07/06/19 1009 07/06/19 1130  BP: 112/68 99/61 93/71  93/62  Pulse: (!) 102 (!) 106 96 (!) 51  Resp: 16 16  16   Temp: 98.1 F (36.7 C) 97.6 F (36.4 C)  98.8 F (37.1 C)  TempSrc: Oral Oral  Oral  SpO2: 98% 98%  98%  Weight: 45.9 kg     Height:        Intake/Output Summary (Last 24 hours) at 07/06/2019 1133 Last data filed at 07/06/2019 0900 Gross per 24 hour  Intake 111 ml  Output 2000 ml  Net -1889 ml   Last 3 Weights 07/06/2019 07/05/2019 07/05/2019  Weight (lbs) 101 lb 3.1 oz 102 lb 11.8 oz 110 lb  Weight (kg) 45.9 kg 46.6 kg 49.896 kg     Body mass index is 15.85 kg/m.  General:  Well nourished, well developed, in no acute distress HEENT: normal Lymph: no adenopathy Neck: no JVD Endocrine:  No thryomegaly Vascular: No carotid bruits  Cardiac:  irreg-irreg, tachycardic; soft SM, no gallops or rubs Lungs:  diminished at the bases b/l, no wheezing, rhonchi or rales  Abd: soft, nontender Ext: no edema Musculoskeletal:  No deformities, advanced/age appropriate atrophy Skin: warm and dry  Neuro:  no focal abnormalities noted Psych:  Normal affect   EKG:  The EKG was personally reviewed and demonstrates:    AFib 11bpm, V paced mostly  10/20/28: AFibm 65, few paced beats Telemetry:  Telemetry was personally reviewed and demonstrates:   Afib  with some improvement in rates, 120'S-130s >>>  90'S-110s Intermittent Vpacing at 100  Relevant CV Studies:  07/05/2019: TTE IMPRESSIONS  1. The left ventricle has severely reduced systolic function, with an ejection fraction of 25-30%. The cavity size was normal. Left ventricular diastolic function could not be evaluated secondary to atrial fibrillation. Left ventricular diffuse    hypokinesis.  2. Severe akinesis of the left ventricular, entire inferior wall.  3. The right ventricle has normal systolic function. The cavity was moderately enlarged. There is no increase in right ventricular wall thickness.  4. Left atrial size was severely dilated.  5. Right atrial size was severely dilated.  6. Large pleural effusion in the right lateral region.  7. The mitral valve is abnormal. Moderate thickening of the mitral valve leaflet. Mitral valve regurgitation is moderate to severe by color flow Doppler.  8. The tricuspid valve is grossly normal. Tricuspid valve regurgitation is mild-moderate.  9. The aortic valve is tricuspid. Mild calcification of the aortic valve. Aortic valve regurgitation is mild by color flow Doppler. Mild-moderate stenosis of the aortic valve. 10. The aorta is normal unless otherwise noted. 11. The inferior vena cava was dilated in size with <50% respiratory variability.    Echo Study Conclusions 09/2018 - Left ventricle: The cavity size was normal. Wall thickness was normal. Systolic function was normal. The estimated ejection fraction was in the range of 50% to 55%. Wall motion was normal; there were no regional wall motion abnormalities. The study is not technically sufficient to allow evaluation of LV diastolic function. - Ventricular septum: Septal motion showed abnormal function and dyssynergy. - Aortic valve: Valve mobility was restricted. There was mild regurgitation. - Mitral valve: Calcified annulus. There was mild regurgitation. - Left atrium: The atrium was moderately dilated. - Right ventricle: The cavity size was moderately dilated. - Right atrium: The atrium was severely dilated. - Tricuspid valve: There was moderate regurgitation. - Pulmonary arteries: Systolic pressure was mildly increased. PA peak pressure: 47 mm Hg (S).  Impressions: - Low normal LV systolic function; calcified aortic valve with  mild AI; mild MR; biatrial enlargement; moderate RVE; moderate TR with mild pulmonary hypertension.  Laboratory Data:  High Sensitivity Troponin:   Recent Labs  Lab 07/05/19 0830 07/05/19 1109  TROPONINIHS 8 7     Cardiac EnzymesNo results for input(s): TROPONINI in the last 168 hours. No results for input(s): TROPIPOC in the last 168 hours.  Chemistry Recent Labs  Lab 07/05/19 0830 07/06/19 0400  NA 136 140  K 3.9 3.5  CL 102 102  CO2 24 26  GLUCOSE 101* 101*  BUN 13 15  CREATININE 0.93 0.88  CALCIUM 9.2 8.9  GFRNONAA 55* 59*  GFRAA >60 >60  ANIONGAP 10 12    No results for input(s): PROT, ALBUMIN, AST, ALT, ALKPHOS, BILITOT in the last 168 hours. Hematology Recent Labs  Lab 07/05/19 0830  WBC 6.2  RBC 3.56*  HGB 10.7*  HCT 34.5*  MCV 96.9  MCH 30.1  MCHC 31.0  RDW 13.2  PLT 169   BNP Recent Labs  Lab 07/05/19 0830  BNP 1,259.5*    DDimer No results for input(s): DDIMER in the last 168 hours.   Radiology/Studies:   Dg Chest 1 View Result Date: 07/05/2019 CLINICAL DATA:  85 year old female status post right-sided thoracentesis EXAM: CHEST  1 VIEW COMPARISON:  Prior chest x-ray obtained earlier today FINDINGS: Interval right-sided thoracentesis with significant reduction in volume of right pleural effusion. No evidence of pneumothorax.  Stable cardiomegaly with a left subclavian approach cardiac rhythm maintenance device in place. Atherosclerotic calcifications are present throughout the aorta. A nonspecific cluster of nodular opacities is present in the periphery of the right upper lobe. No pulmonary nodules are evident on the prior chest CT from June 22, 2018. Chronic bronchitic changes and bibasilar atelectasis are noted. No acute osseous abnormality. IMPRESSION: 1. Negative for pneumothorax or other complication following right-sided thoracentesis. 2. Nonspecific cluster of nodular opacities in the periphery of the right lung apex. Recommend further  evaluation with dedicated PA and lateral chest x-ray. 3. Cardiomegaly and aortic atherosclerosis. Electronically Signed   By: Malachy Moan M.D.   On: 07/05/2019 15:04      Ir Thoracentesis Asp Pleural Space W/img Guide Result Date: 07/05/2019 INDICATION: Patient with history of HF exacerbation, dyspnea, and recurrent right pleural effusion. Request is made for therapeutic right thoracentesis. EXAM: ULTRASOUND GUIDED THERAPEUTIC RIGHT THORACENTESIS MEDICATIONS: 10 mL 1% lidocaine COMPLICATIONS: None immediate. PROCEDURE: An ultrasound guided thoracentesis was thoroughly discussed with the patient and questions answered. The benefits, risks, alternatives and complications were also discussed. The patient understands and wishes to proceed with the procedure. Written consent was obtained. Ultrasound was performed to localize and mark an adequate pocket of fluid in the right chest. The area was then prepped and draped in the normal sterile fashion. 1% Lidocaine was used for local anesthesia. Under ultrasound guidance a 6 Fr Safe-T-Centesis catheter was introduced. Thoracentesis was performed. The catheter was removed and a dressing applied. FINDINGS: A total of approximately 1.35 L of clear gold fluid was removed. IMPRESSION: Successful ultrasound guided right thoracentesis yielding 1.35 L of pleural fluid. Read by: Elwin Mocha, PA-C Electronically Signed   By: Gilmer Mor D.O.   On: 07/05/2019 15:22    Assessment and Plan:   1. H/o sinus node dysfunction w/ PPM     Biotronik dual chamber PPM, programmed VVI-CLS, 60-110 2. Permanent AFib     CHA2DS2Vasc is 4, on warfarin  In review remote June 2020 (Dec 2019-April 21, 2019), VP % 78% Dec in-clinic VP was 88% (since Nov 2019)   In discussion with Biotronik Rep: Suspects pacing at higher rates are a function of CLS pacing that will try to smooth R-R intervals, particularly at higher more irregular intrinsic rates  I will review programming  with Dr. Graciela Husbands, suspect as her HF is treated and improves, and the adjustment in her BB her HR may continue to improve as well. We can consider turning CLS off, but she has h/o orthostatic issues (that have been better for "a long time", her CLS programming may be providing her benefit.   >>>> ? If RV pacing contributes to her reduction in LVEF  Can always be a concern, though she does have a new significant WMA with severe akinesis of the entire IW, she may have infarcted her since her last echo in Nov 2019 that could also account for the change Her HS Trop are neg now, suggests if she has had an MI, occurred a while ago.  She denies any h/o CP, near syncope or syncope Will defer decision on any ischemic eval to cardiology team          For questions or updates, please contact [facility] Please consult [email or web address] for contact info under     Signed, Sheilah Pigeon, PA-C  07/06/2019 11:33 AM  Atrial fibrillation permanent with modestly rapid rate   Pacemaker Biotronik CLS activated for sinus node dysfunction  CHF  acute/chronic s/p thoracentesis   Cardiomyopathy new  LVEf 25%  Inferior Wall Akinesis  Borderline blood pressure  anticoagulation w warfarin  Stroke   HTN  Orthostatic Hypotension borderline    The patient presents with acute on chronic congestive heart failure now with new left ventricular systolic dysfunction.  The differential diagnosis of the latter is concerning for both ischemic heart disease based on the dense wall motion abnormality, rapid rates from atrial fibrillation as well as significant burden of ventricular pacing.  Comparing heart rate excursion now versus December, the percent beats faster than 100 have gone from about less than 10 to greater than 40%.  The mechanism of CLS in the VVI mode is associated with a rate smoothing feature such that she retains significant ventricular pacing (over 75%) even pacing of heart rates up to  110.  Speaking with Dr. Johney FrameAllred, he reminded me that her pacemaker was implanted for sinus node dysfunction.  Her need for pacing in the context of atrial fibrillation then is not at all clear.  She clearly has atrial fibrillation conduction up to 100+ beats per minute.  Hence, we will inactivate CLS.  We will lower the pacing rate to VVI-50.  Potentially could be lowered further than that.  This should help to decrease the ventricular pacing percentage.  Rate control may also be a challenge.  She is a little old lady with surprisingly good renal function.  We will use digoxin in the context of her LV dysfunction to help control heart rate.  She will need a digoxin level in the next 2 to 3 weeks.  There may be a role down the road for AV junction ablation although this was presumed that we could distinguish between rates contributing to her cardiomyopathy and ventricular pacing being a contributor.  She has had some chest discomfort.  I have discussed with Dr. Champ MungoMS and we will plan to address the possible ischemic contribution to her cardiomyopathy and managing her symptoms.  This may be addressed already by the increased doses of her beta-blockers.  There is no evidence of an acute MI and so we will avoid any antiplatelet therapy  Discussed with her daughter

## 2019-07-06 NOTE — Progress Notes (Signed)
Patient's BP is 88/55 and HR is still ranging from the 110s-130s. Patients MEWS score is currently a 3. MD is aware of patients status. Gave orders to recheck BP and HR. MD viewed recheck of BP and HR and gave orders to monitor BP at this time.

## 2019-07-06 NOTE — Progress Notes (Signed)
   Subjective:   Ms. Rozzell is doing "as well as can be suspected." She has a lot of chronic medical problems that she deals with. She is resting comfortably and does not feels SHOB. She is unsure why she is on oxygen. She is unsure if she was on oxygen therapy at home. She wonders how her pacemaker works and whether we are monitoring her rhythm. Discussed the plan to continue to coordinate with cardiology. All questions and concerns addressed.   Objective: Vital signs in last 24 hours: Vitals:   07/06/19 0035 07/06/19 0110 07/06/19 0331 07/06/19 0539  BP: (!) 88/55 (!) 88/58 (!) 97/53 112/68  Pulse: (!) 129 (!) 117 (!) 109 (!) 102  Resp: 18  16 16   Temp: 98.2 F (36.8 C)  98.1 F (36.7 C) 98.1 F (36.7 C)  TempSrc: Oral  Oral Oral  SpO2: 94%  96% 98%  Weight:    45.9 kg  Height:       Physical Exam Constitutional:      General: She is not in acute distress.    Appearance: She is not toxic-appearing.  Cardiovascular:     Rate and Rhythm: Tachycardia present. Rhythm irregular.     Heart sounds: No murmur.  Pulmonary:     Effort: Pulmonary effort is normal. No tachypnea or accessory muscle usage.     Breath sounds: Normal breath sounds. No decreased breath sounds, wheezing, rhonchi or rales.  Musculoskeletal:     Right lower leg: She exhibits no tenderness. No edema.     Left lower leg: She exhibits no tenderness. No edema.  Skin:    General: Skin is warm and dry.  Neurological:     General: No focal deficit present.     Mental Status: She is alert. She is disoriented.    Assessment/Plan:  Active Problems:   Recurrent right pleural effusion   CHF exacerbation ([place])  # Acute on Chronic Heart Failure:  # Recurrent Pleural effusion Echo on admission shows new changes including reduced EF of 25-30% and left ventricular wall abnormality.  Cardiology does not feel she is a good candidate for catheterization.  Given her decreased EF, diltiazem was swapped for metoprolol.   Continue diuresing with Lasix with goal of resuming home dose of Lasix.  Now that her pleural effusion has improved significantly, will try to wean oxygen as tolerated.  - Cardiology on board and we appreciate their recommendations - Metoprolol 100mg  QD - Lasix IV 40mg  BID - Wean oxygen as tolerated  # Persistent A. Fib # Sinus Node Dysfunction s/p PPM V-pacing rhythm while in the ED.  EP is on board to evaluate.  - Follow-up electrophysiology recommendations - Warfarin per pharmacy dosing  # Chronic Diarrhea:  Continue home meds.  - Cholestyramine, 4g Daily (prescribed as TID, but takes Daily),  - Loperamide 4mg  Daily - Lomotil TID PRN    Dispo: Anticipated discharge in approximately 1-2 days.  Dr. Jose Persia Internal Medicine PGY-1  Pager: [phone] 07/06/2019, 6:45 AM

## 2019-07-06 NOTE — Evaluation (Signed)
Physical Therapy Evaluation Patient Details Name: Carol Snyder MRN: [id number] DOB: [birth year] Today's Date: 07/06/2019   History of Present Illness  Patient is an 85 year old female admitted with SOB, LE edema. Patient with CHF, s/p thoracentsis 8/18 and this past nevember. PMH to include afib, CVA, pacemaker.  Clinical Impression  Patient received in bed, daughter present for evaluation. Patient performed bed mobility with mod independence ( use of rails). Reports she needs to use bathroom prior to walking. Assisted patient to [facility]. Then patient ambulated 150 feet with RW and min guard assist. No significant difficulties noted. Patient ambulated on 2 lpm O2. Sats remained in the 90%s. Patient will benefit from continued skilled PT acutely for improved activity tolerance and safety with mobility.       Follow Up Recommendations Home health PT    Equipment Recommendations  None recommended by PT    Recommendations for Other Services       Precautions / Restrictions Precautions Precautions: Fall Precaution Comments: mod fall Restrictions Weight Bearing Restrictions: No      Mobility  Bed Mobility Overal bed mobility: Modified Independent             General bed mobility comments: use of bed rails  Transfers Overall transfer level: Needs assistance Equipment used: Rolling walker (2 wheeled) Transfers: Sit to/from Stand Sit to Stand: Min guard            Ambulation/Gait Ambulation/Gait assistance: Min guard Gait Distance (Feet): 150 Feet Assistive device: Rolling walker (2 wheeled) Gait Pattern/deviations: Step-to pattern     General Gait Details: ambulates with shoes on due to leg length descrepancy  Stairs            Wheelchair Mobility    Modified Rankin (Stroke Patients Only)       Balance Overall balance assessment: Modified Independent                                           Pertinent Vitals/Pain Pain Assessment:  No/denies pain    Home Living Family/patient expects to be discharged to:: Private residence Living Arrangements: Children;Other relatives Available Help at Discharge: Family;Available 24 hours/day Type of Home: House Home Access: Ramped entrance     Home Layout: One level Home Equipment: Walker - 2 wheels;Walker - 4 wheels;Cane - single point;Wheelchair - [employer];Shower seat - built in      Prior Function Level of Independence: Independent with assistive device(s)         Comments: patient uses RW at baseline. Has family to assist her around the clock if needed.     Hand Dominance   Dominant Hand: Right    Extremity/Trunk Assessment   Upper Extremity Assessment Upper Extremity Assessment: Overall WFL for tasks assessed    Lower Extremity Assessment Lower Extremity Assessment: Overall WFL for tasks assessed    Cervical / Trunk Assessment Cervical / Trunk Assessment: Normal  Communication   Communication: No difficulties  Cognition Arousal/Alertness: Awake/alert Behavior During Therapy: WFL for tasks assessed/performed Overall Cognitive Status: Within Functional Limits for tasks assessed                                        General Comments      Exercises     Assessment/Plan  PT Assessment Patient needs continued PT services  PT Problem List Decreased mobility;Decreased activity tolerance       PT Treatment Interventions Therapeutic activities;Therapeutic exercise;Gait training    PT Goals (Current goals can be found in the Care Plan section)  Acute Rehab PT Goals Patient Stated Goal: to return home PT Goal Formulation: With patient/family Time For Goal Achievement: 07/13/19 Potential to Achieve Goals: Good    Frequency Min 2X/week   Barriers to discharge        Co-evaluation               AM-PAC PT "6 Clicks" Mobility  Outcome Measure Help needed turning from your back to your side while in a flat  bed without using bedrails?: A Little Help needed moving from lying on your back to sitting on the side of a flat bed without using bedrails?: A Little Help needed moving to and from a bed to a chair (including a wheelchair)?: A Little Help needed standing up from a chair using your arms (e.g., wheelchair or bedside chair)?: A Little Help needed to walk in hospital room?: A Little Help needed climbing 3-5 steps with a railing? : A Little 6 Click Score: 18    End of Session Equipment Utilized During Treatment: Gait belt;Oxygen Activity Tolerance: Patient tolerated treatment well Patient left: in bed;with call bell/phone within reach;with family/visitor present Nurse Communication: Mobility status PT Visit Diagnosis: Difficulty in walking, not elsewhere classified (R26.2)    Time: [id number] PT Time Calculation (min) (ACUTE ONLY): 17 min   Charges:   PT Evaluation $PT Eval Low Complexity: 1 Low PT Treatments $Gait Training: 8-22 mins        Alanna Storti, PT, GCS 07/06/19,2:23 PM

## 2019-07-06 NOTE — Progress Notes (Signed)
ANTICOAGULATION CONSULT NOTE - Initial Consult  Pharmacy Consult for warfarin Indication: atrial fibrillation  Patient Measurements: Height: 5\' 7"  (170.2 cm) Weight: 101 lb 3.1 oz (45.9 kg) IBW/kg (Calculated) : 61.6  Vital Signs: Temp: 97.6 F (36.4 C) (08/19 0740) Temp Source: Oral (08/19 0740) BP: 99/61 (08/19 0740) Pulse Rate: 106 (08/19 0740)  Labs: Recent Labs    07/05/19 0830 07/05/19 1109 07/06/19 0400  HGB 10.7*  --   --   HCT 34.5*  --   --   PLT 169  --   --   LABPROT 23.1*  --  22.8*  INR 2.1*  --  2.1*  CREATININE 0.93  --  0.88  TROPONINIHS 8 7  --     Estimated Creatinine Clearance: 32 mL/min (by C-G formula based on SCr of 0.88 mg/dL).   Medical History: Past Medical History:  Diagnosis Date  . Anemia   . Aortic insufficiency   . Chest pain   . Chronic diarrhea   . DNR (do not resuscitate)   . Falling   . History of GI bleed   . History of shingles   . Long term current use of anticoagulant   . Mitral regurgitation   . Orthostatic hypotension   . Persistent atrial fibrillation    on Coumadin  . Pleural effusion   . Sinus node dysfunction (HCC)    a. s/p Biotronik PPM.  . Stroke ([place])   . Tricuspid regurgitation    Assessment: 80 yof presented to the ED with shortness of breath. She is on chronic warfarin for history of afib. INR is therapeutic at 2.1. No bleeding noted. Platelets were WNL at last check.   Goal of Therapy:  INR 2-3 Monitor platelets by anticoagulation protocol: Yes   Plan:  Warfarin 2.5mg  PO x 1 tonight Daily INR  Alanda Slim, PharmD, [place] Clinical Pharmacist Please see AMION for all Pharmacists' Contact Phone Numbers 07/06/2019, 8:48 AM

## 2019-07-06 NOTE — Progress Notes (Signed)
Progress Note  Patient Name: Carol Snyder Date of Encounter: 07/06/2019  Primary Cardiologist: Donato SchultzMark Skains, MD   Subjective  Reports improvement in dyspnea.  Denies any chest pain, palpitations, or lightheadedness.   Inpatient Medications    Scheduled Meds: . albuterol  5 mg Nebulization Once  . cholestyramine  4 g Oral Daily  . furosemide  40 mg Intravenous BID  . loperamide  4 mg Oral Daily  . metoprolol succinate  100 mg Oral Daily  . potassium chloride SA  20 mEq Oral Daily  . sodium chloride flush  3 mL Intravenous Q12H  . warfarin  2.5 mg Oral ONCE-1800  . Warfarin - Pharmacist Dosing Inpatient   Does not apply q1800   Continuous Infusions:  PRN Meds: acetaminophen **OR** acetaminophen, diphenoxylate-atropine, hydroxypropyl methylcellulose / hypromellose, lidocaine (PF)   Vital Signs    Vitals:   07/06/19 0740 07/06/19 1009 07/06/19 1130 07/06/19 1411  BP: 99/61 93/71 93/62    Pulse: (!) 106 96 (!) 51   Resp: 16  16   Temp: 97.6 F (36.4 C)  98.8 F (37.1 C)   TempSrc: Oral  Oral   SpO2: 98%  98% 98%  Weight:      Height:        Intake/Output Summary (Last 24 hours) at 07/06/2019 1607 Last data filed at 07/06/2019 0900 Gross per 24 hour  Intake 111 ml  Output 1000 ml  Net -889 ml   Last 3 Weights 07/06/2019 07/05/2019 07/05/2019  Weight (lbs) 101 lb 3.1 oz 102 lb 11.8 oz 110 lb  Weight (kg) 45.9 kg 46.6 kg 49.896 kg      Telemetry    Intermittent V-paced and V-sensed, underlying rhythm appears to be AF with RVR - Personally Reviewed  ECG    V-paced and AF - Personally Reviewed  Physical Exam   GEN: No acute distress.   Neck: + JVD Cardiac: irregular, tachycardic, no murmurs Respiratory: Crackles at bases GI: Soft, nontender, non-distended  MS: No edema; No deformity. Neuro:  Nonfocal  Psych: Normal affect   Labs    High Sensitivity Troponin:   Recent Labs  Lab 07/05/19 0830 07/05/19 1109  TROPONINIHS 8 7      Cardiac EnzymesNo  results for input(s): TROPONINI in the last 168 hours. No results for input(s): TROPIPOC in the last 168 hours.   Chemistry Recent Labs  Lab 07/05/19 0830 07/06/19 0400  NA 136 140  K 3.9 3.5  CL 102 102  CO2 24 26  GLUCOSE 101* 101*  BUN 13 15  CREATININE 0.93 0.88  CALCIUM 9.2 8.9  GFRNONAA 55* 59*  GFRAA >60 >60  ANIONGAP 10 12     Hematology Recent Labs  Lab 07/05/19 0830  WBC 6.2  RBC 3.56*  HGB 10.7*  HCT 34.5*  MCV 96.9  MCH 30.1  MCHC 31.0  RDW 13.2  PLT 169    BNP Recent Labs  Lab 07/05/19 0830  BNP 1,259.5*     DDimer No results for input(s): DDIMER in the last 168 hours.   Radiology    Dg Chest 1 View  Result Date: 07/05/2019 CLINICAL DATA:  85 year old female status post right-sided thoracentesis EXAM: CHEST  1 VIEW COMPARISON:  Prior chest x-ray obtained earlier today FINDINGS: Interval right-sided thoracentesis with significant reduction in volume of right pleural effusion. No evidence of pneumothorax. Stable cardiomegaly with a left subclavian approach cardiac rhythm maintenance device in place. Atherosclerotic calcifications are present throughout the aorta. A nonspecific  cluster of nodular opacities is present in the periphery of the right upper lobe. No pulmonary nodules are evident on the prior chest CT from June 22, 2018. Chronic bronchitic changes and bibasilar atelectasis are noted. No acute osseous abnormality. IMPRESSION: 1. Negative for pneumothorax or other complication following right-sided thoracentesis. 2. Nonspecific cluster of nodular opacities in the periphery of the right lung apex. Recommend further evaluation with dedicated PA and lateral chest x-ray. 3. Cardiomegaly and aortic atherosclerosis. Electronically Signed   By: Jacqulynn Cadet M.D.   On: 07/05/2019 15:04   Dg Chest Portable 1 View  Result Date: 07/05/2019 CLINICAL DATA:  Shortness of breath for 4 days. Peripheral edema. EXAM: PORTABLE CHEST 1 VIEW COMPARISON:   09/29/2018 FINDINGS: Cardiomegaly shows no significant change. Transvenous pacemaker remains in appropriate position. Diffuse interstitial infiltrates are seen consistent with mild interstitial edema. Increased moderate to large right pleural effusion is seen with right lung atelectasis. IMPRESSION: Increased diffuse interstitial infiltrates, consistent with interstitial edema. Stable cardiomegaly. Increased moderate to large right pleural effusion, with right lung atelectasis. Electronically Signed   By: Marlaine Hind M.D.   On: 07/05/2019 08:54   Ir Thoracentesis Asp Pleural Space W/img Guide  Result Date: 07/05/2019 INDICATION: Patient with history of HF exacerbation, dyspnea, and recurrent right pleural effusion. Request is made for therapeutic right thoracentesis. EXAM: ULTRASOUND GUIDED THERAPEUTIC RIGHT THORACENTESIS MEDICATIONS: 10 mL 1% lidocaine COMPLICATIONS: None immediate. PROCEDURE: An ultrasound guided thoracentesis was thoroughly discussed with the patient and questions answered. The benefits, risks, alternatives and complications were also discussed. The patient understands and wishes to proceed with the procedure. Written consent was obtained. Ultrasound was performed to localize and mark an adequate pocket of fluid in the right chest. The area was then prepped and draped in the normal sterile fashion. 1% Lidocaine was used for local anesthesia. Under ultrasound guidance a 6 Fr Safe-T-Centesis catheter was introduced. Thoracentesis was performed. The catheter was removed and a dressing applied. FINDINGS: A total of approximately 1.35 L of clear gold fluid was removed. IMPRESSION: Successful ultrasound guided right thoracentesis yielding 1.35 L of pleural fluid. Read by: Earley Abide, PA-C Electronically Signed   By: Corrie Mckusick D.O.   On: 07/05/2019 15:22    Cardiac Studies   TTE personally reviewed: severe global LV hypokinesis (EF 34-74%), mild RV systolic dysfunction, mod-severe MR,  mild-mod AS, mild AI  Patient Profile     85 y.o. female  with history of chronic diastolic CHF with recurrent bilateral pleural effusion s/p thoracentesis in 06/2018 and 09/2018, persistent atrial fibrillation on Coumadin, sinus node dysfunction s/p Biotronik PPM, prior CVA, and orthostatic hypotension who presented with acute systolic heart failure  Assessment & Plan    Acute systolic heart failure: EF 25-30% on TTE.  Suspect tachycardia-induced as device interrogation shows elevated rates for last several months; also with frequent (76%) RV pacing which may be contributing.  Ischemia also on differential - IV diuresis with lasix 40 mg BID for goal net negative 1-2L - Can consider ischemia evaluation once more euvolemic - Discussed with Biotronik rep concerning CLS mode on PPM leading to frequent RV pacing.  Consult EP - Rate control of AF with toprol XL   Persistent Atrial Fibrillation/ Sinus Node Dysfunction s/p PPM: device interrogation shows underlying rate is AF, and despite mode VVI 60 is having frequent RV pacing due to CLS mode - Consult EP - Toprol XL 100 mg daily. - Continue chronic anticoagulation with Coumadin. Dosing per pharmacy.   Recurrent  Pleural Effusion: s/p thoracentesis - Continue diuresis as above.     For questions or updates, please contact [facility] Please consult [email or web address] for contact info under        Signed, Little Ishikawahristopher L Lisia [place], MD  07/06/2019, 4:07 PM

## 2019-07-06 NOTE — Progress Notes (Signed)
Initial Nutrition Assessment  DOCUMENTATION CODES:   Underweight, Severe malnutrition in context of chronic illness  INTERVENTION:   -Boost Breeze po TID, each supplement provides 250 kcal and 9 grams of protein -Magic cup TID with meals, each supplement provides 290 kcal and 9 grams of protein -MVI with minerals daily  NUTRITION DIAGNOSIS:   Severe Malnutrition related to chronic illness(CHF) as evidenced by moderate fat depletion, severe fat depletion, moderate muscle depletion, severe muscle depletion.  GOAL:   Patient will meet greater than or equal to 90% of their needs  MONITOR:   PO intake, Supplement acceptance, Labs, Weight trends, Skin, I & O's  REASON FOR ASSESSMENT:   Malnutrition Screening Tool    ASSESSMENT:   Carol Snyder is an 85 yo F with hx of CHF (EF 50-55%), Atrial fibrillation (on Warfarin), Sinus Node Dysfunction (Pacemaker in place), Recurrent transudative R pleural effusion, and Chronic diarrhea who presented with shortness of breath. She reports 3-4 days of progressive shortness of breath that worsened further during the past night. She reports orthopnea, PND, and increased LE edema. She denies any changes to her diet, fluid intake (tries to drink 3 bottles of water per day), Urine output (possibly increased per daughter), nor any medication changes. She reports chronic diarrhea. She denies Chest pain, fevers, cough, nausea, or sick contacts  Pt admitted with CHF exacerbation.   8/18- s/p right thoracentesis (1.35 liters of clear gold fluid removed)  Spoke with pt and daughter at bedside, who reports that pt has inconsistent PO intake, secondary "to my stomach". Pt reports that her stomach "acts up" and sometimes has a decreased appetite and taste for food. Pt follows a low sodium diet (no added salt) at home and will often eat foods that are appealing to her. She shares that her family members cook her meal and she may visit a fast food restaurant 1-2 times  per week (favorite menu items include [employer] mozzarella sticks). Typical intake will be breakfast of scrambled eggs and toast; lunch homemade chicken soup, and dinner of a meat, starch, and vegetable. Pt reports snacking on a lot of fruit, such as cantaloupe and drinking 2-3 bottles of water daily.   Pt unsure if she has lost weight, as she has not weighed herself lately. Per wt hx, pt has experienced a 7.8% wt loss over the past 6 months, which while not significant for time frame, is concerning given advanced age and underweight status.   Discussed ways to increase calories and protein in diet. Encouraged liberalized diet to promote adequate oral intake. Pt amenable to Boost Breeze and Magic cups.   Labs reviewed.   NUTRITION - FOCUSED PHYSICAL EXAM:    Most Recent Value  Orbital Region  Severe depletion  Upper Arm Region  Moderate depletion  Thoracic and Lumbar Region  Moderate depletion  Buccal Region  Severe depletion  Temple Region  Severe depletion  Clavicle Bone Region  Severe depletion  Clavicle and Acromion Bone Region  Severe depletion  Scapular Bone Region  Severe depletion  Dorsal Hand  Moderate depletion  Patellar Region  Severe depletion  Anterior Thigh Region  Severe depletion  Posterior Calf Region  Severe depletion  Edema (RD Assessment)  None  Hair  Reviewed  Eyes  Reviewed  Mouth  Reviewed  Skin  Reviewed  Nails  Reviewed       Diet Order:   Diet Order            Diet Heart Room service appropriate?  Yes; Fluid consistency: Thin; Fluid restriction: 1200 mL Fluid  Diet effective now              EDUCATION NEEDS:   Education needs have been addressed  Skin:  Skin Assessment: Reviewed RN Assessment  Last BM:  Unknown  Height:   Ht Readings from Last 1 Encounters:  07/05/19 5\' 7"  (1.702 m)    Weight:   Wt Readings from Last 1 Encounters:  07/06/19 45.9 kg    Ideal Body Weight:  61.4 kg  BMI:  Body mass index is 15.85 kg/m.  Estimated  Nutritional Needs:   Kcal:  1200-1400  Protein:  55-70 grams  Fluid:  1.2 L    Garin Mata A. Jimmye Norman, RD, LDN, [place] Registered Dietitian II Certified Diabetes Care and Education Specialist Pager: [phone] After hours Pager: [phone]

## 2019-07-06 NOTE — Progress Notes (Signed)
Pt's BP was 86/57.  HR was 127. PT was asymptomatic. We will keep monitor BP & HR.  MD recommended that if SBP is less than 90, we should hold IV lasix.

## 2019-07-07 LAB — BASIC METABOLIC PANEL
Anion gap: 8 (ref 5–15)
BUN: 20 mg/dL (ref 8–23)
CO2: 30 mmol/L (ref 22–32)
Calcium: 8.8 mg/dL — ABNORMAL LOW (ref 8.9–10.3)
Chloride: 101 mmol/L (ref 98–111)
Creatinine, Ser: 0.98 mg/dL (ref 0.44–1.00)
GFR calc Af Amer: 60 mL/min — ABNORMAL LOW (ref >60)
GFR calc non Af Amer: 52 mL/min — ABNORMAL LOW (ref >60)
Glucose, Bld: 93 mg/dL (ref 70–99)
Potassium: 4.3 mmol/L (ref 3.5–5.1)
Sodium: 139 mmol/L (ref 135–145)

## 2019-07-07 LAB — MAGNESIUM: Magnesium: 1.7 mg/dL (ref 1.7–2.4)

## 2019-07-07 LAB — PROTIME-INR
INR: 2.4 — ABNORMAL HIGH (ref 0.8–1.2)
Prothrombin Time: 25.7 seconds — ABNORMAL HIGH (ref 11.4–15.2)

## 2019-07-07 MED ORDER — WARFARIN SODIUM 2.5 MG PO TABS
2.5000 mg | ORAL_TABLET | Freq: Once | ORAL | Status: AC
  Administered 2019-07-07: 2.5 mg via ORAL
  Filled 2019-07-07: qty 1

## 2019-07-07 MED ORDER — BOOST / RESOURCE BREEZE PO LIQD CUSTOM
1.0000 | Freq: Three times a day (TID) | ORAL | Status: DC
  Administered 2019-07-07 – 2019-07-09 (×7): 1 via ORAL

## 2019-07-07 MED ORDER — ADULT MULTIVITAMIN W/MINERALS CH
1.0000 | ORAL_TABLET | Freq: Every day | ORAL | Status: DC
  Administered 2019-07-07 – 2019-07-09 (×3): 1 via ORAL
  Filled 2019-07-07 (×3): qty 1

## 2019-07-07 NOTE — Progress Notes (Addendum)
[place] for warfarin Indication: atrial fibrillation  Patient Measurements: Height: 5\' 7"  (170.2 cm) Weight: 99 lb 8 oz (45.1 kg) IBW/kg (Calculated) : 61.6  Vital Signs: Temp: 97.5 F (36.4 C) (08/20 0444) Temp Source: Oral (08/20 0444) BP: 101/64 (08/20 0915) Pulse Rate: 88 (08/20 0915)  Labs: Recent Labs    07/05/19 0830 07/05/19 1109 07/06/19 0400 07/07/19 0620  HGB 10.7*  --   --   --   HCT 34.5*  --   --   --   PLT 169  --   --   --   LABPROT 23.1*  --  22.8* 25.7*  INR 2.1*  --  2.1* 2.4*  CREATININE 0.93  --  0.88 0.98  TROPONINIHS 8 7  --   --     Estimated Creatinine Clearance: 28.3 mL/min (by C-G formula based on SCr of 0.98 mg/dL).   Medical History: Past Medical History:  Diagnosis Date  . Anemia   . Aortic insufficiency   . Chest pain   . Chronic diarrhea   . DNR (do not resuscitate)   . Falling   . History of GI bleed   . History of shingles   . Long term current use of anticoagulant   . Mitral regurgitation   . Orthostatic hypotension   . Persistent atrial fibrillation    on Coumadin  . Pleural effusion   . Sinus node dysfunction (HCC)    a. s/p Biotronik PPM.  . Stroke ([place])   . Tricuspid regurgitation    Assessment: 52 yof presented to the ED with shortness of breath. She is on chronic warfarin for history of afib. INR is therapeutic at 2.4. No bleeding noted. Platelets were WNL at last check.   Goal of Therapy:  INR 2-3 Monitor platelets by anticoagulation protocol: Yes   Plan:  Warfarin 2.5mg  PO x 1 tonight Daily INR  Emeline General, PharmD Candidate Please see AMION for all Pharmacists' Contact Phone Numbers 07/07/2019, 9:27 AM   I discussed / reviewed the pharmacy note by Dr. Toula Moos and I agree with the resident's findings and plans as documented.  Anette Guarneri, PharmD

## 2019-07-07 NOTE — Progress Notes (Addendum)
   Subjective:   Carol Snyder reports she is doing well this morning.  She has no acute complaints at this time, and denies shortness of breath, chest pain, abdominal pain, palpitations.   Objective:  Vital signs in last 24 hours: Vitals:   07/06/19 2131 07/07/19 0100 07/07/19 0444 07/07/19 0601  BP: 104/69 100/72 95/63   Pulse: (!) 110 (!) 120 91 93  Resp: 20 20 20    Temp: 98.3 F (36.8 C) 98.9 F (37.2 C) (!) 97.5 F (36.4 C)   TempSrc: Oral Oral Oral   SpO2: 91% 97% 99%   Weight:      Height:       Filed Weights   07/05/19 1900 07/06/19 0539 07/07/19 0734  Weight: 46.6 kg 45.9 kg 45.1 kg   Intake/Output Summary (Last 24 hours) at 07/07/2019 1516 Last data filed at 07/07/2019 0941 Gross per 24 hour  Intake 363 ml  Output 1900 ml  Net -1537 ml     Physical Exam Constitutional:      General: She is not in acute distress.    Appearance: She is normal weight.  Cardiovascular:     Rate and Rhythm: Normal rate.     Heart sounds: Murmur present.  Pulmonary:     Effort: Pulmonary effort is normal. No accessory muscle usage or respiratory distress.     Breath sounds: Normal breath sounds. No decreased breath sounds, wheezing, rhonchi or rales.  Musculoskeletal:     Right lower leg: She exhibits no tenderness. No edema.     Left lower leg: She exhibits no tenderness. No edema.  Skin:    General: Skin is warm and dry.  Neurological:     Mental Status: She is alert.   +JVD, heart rhythm irr, rate irr Assessment/Plan:  Principal Problem:   Acute on chronic systolic congestive heart failure (HCC) Active Problems:   Atrial fibrillation, chronic   Pacemaker   Recurrent right pleural effusion  # Acute on Chronic combined Heart Failure:  # Recurrent Pleural effusion Echo on admission shows new changes including reduced EF of 25-30% and left ventricular wall abnormality.  Cardiology does not feel she is a good candidate for catheterization.  Given her decreased EF,  diltiazem was swapped for metoprolol. Continue diuresing with Lasix with goal of resuming home dose of Lasix. Patient appears to still be wearing 2 L but saturating well.  Will attempt again to wean.  PT and OT are recommending home health.   - Cardiology + EP on board and we appreciate their recommendations - Metoprolol 100mg  QD - Lasix IV 40mg  BID with goal of net negative 1 to 2 L - Wean oxygen as tolerated - Strict ins and outs   # Persistent A. Fib # Sinus Node Dysfunction s/p PPM V-pacing rhythm while in the ED. EP recommends starting digoxin for better rate control and have discontinued CLS.     - EP is on board and we appreciate their recommendations. - Digoxin 0.0625mg  daily -repeat dig level in 2-3 weeks - Warfarin per pharmacy dosing   # Chronic Diarrhea:  Stable at this time.  Continue home meds.   - Cholestyramine, 4g Daily (prescribed as TID, but takes Daily),  - Loperamide 4mg  Daily - Lomotil TID PRN      Dispo: Anticipated discharge in approximately 1-2 days.   Dr. Jose Persia Internal Medicine PGY-1  Pager: [phone] 07/07/2019, 6:38 AM

## 2019-07-07 NOTE — Care Management Important Message (Signed)
Important Message  Patient Details  Name: Carol Snyder MRN: [id number] Date of Birth: 1929-09-21   Medicare Important Message Given:  Yes     Shelda Altes 07/07/2019, 1:54 PM

## 2019-07-07 NOTE — Progress Notes (Signed)
Progress Note  Patient Name: Carol Snyder Date of Encounter: 07/07/2019  Primary Cardiologist: Donato SchultzMark Skains, MD   Subjective  States that dyspnea has improved.  Denies any chest pain.  Reports intermittent palpitations.   Inpatient Medications    Scheduled Meds:  albuterol  5 mg Nebulization Once   cholestyramine  4 g Oral Daily   digoxin  0.25 mg Oral Q8H   Followed by   Melene Muller[START ON 07/08/2019] digoxin  0.0625 mg Oral Daily   feeding supplement  1 Container Oral TID BM   furosemide  40 mg Intravenous BID   loperamide  4 mg Oral Daily   metoprolol succinate  100 mg Oral Daily   multivitamin with minerals  1 tablet Oral Daily   potassium chloride SA  20 mEq Oral Daily   sodium chloride flush  3 mL Intravenous Q12H   warfarin  2.5 mg Oral ONCE-1800   Warfarin - Pharmacist Dosing Inpatient   Does not apply q1800   Continuous Infusions:  PRN Meds: acetaminophen **OR** acetaminophen, diphenoxylate-atropine, hydroxypropyl methylcellulose / hypromellose, lidocaine (PF)   Vital Signs    Vitals:   07/07/19 0444 07/07/19 0601 07/07/19 0734 07/07/19 0915  BP: 95/63   101/64  Pulse: 91 93  88  Resp: 20     Temp: (!) 97.5 F (36.4 C)     TempSrc: Oral     SpO2: 99%     Weight:   45.1 kg   Height:        Intake/Output Summary (Last 24 hours) at 07/07/2019 1017 Last data filed at 07/07/2019 0941 Gross per 24 hour  Intake 483 ml  Output 1900 ml  Net -1417 ml   Last 3 Weights 07/07/2019 07/06/2019 07/05/2019  Weight (lbs) 99 lb 8 oz 101 lb 3.1 oz 102 lb 11.8 oz  Weight (kg) 45.133 kg 45.9 kg 46.6 kg      Telemetry  Personally Reviewed: no longer V-pacing, now AF with RVR, rates 90-110s.  ECG    V-paced and AF - Personally Reviewed  Physical Exam   GEN: No acute distress.   Neck: + JVD Cardiac: irregular, tachycardic, no murmurs Respiratory: Dimnished at bases, no crackles GI: Soft, nontender, non-distended  MS: No edema; No deformity. Neuro:  Nonfocal    Psych: Normal affect   Labs    High Sensitivity Troponin:   Recent Labs  Lab 07/05/19 0830 07/05/19 1109  TROPONINIHS 8 7      Cardiac EnzymesNo results for input(s): TROPONINI in the last 168 hours. No results for input(s): TROPIPOC in the last 168 hours.   Chemistry Recent Labs  Lab 07/05/19 0830 07/06/19 0400 07/07/19 0620  NA 136 140 139  K 3.9 3.5 4.3  CL 102 102 101  CO2 24 26 30   GLUCOSE 101* 101* 93  BUN 13 15 20   CREATININE 0.93 0.88 0.98  CALCIUM 9.2 8.9 8.8*  GFRNONAA 55* 59* 52*  GFRAA >60 >60 60*  ANIONGAP 10 12 8      Hematology Recent Labs  Lab 07/05/19 0830  WBC 6.2  RBC 3.56*  HGB 10.7*  HCT 34.5*  MCV 96.9  MCH 30.1  MCHC 31.0  RDW 13.2  PLT 169    BNP Recent Labs  Lab 07/05/19 0830  BNP 1,259.5*     DDimer No results for input(s): DDIMER in the last 168 hours.   Radiology    Dg Chest 1 View  Result Date: 07/05/2019 CLINICAL DATA:  85 year old female status post  right-sided thoracentesis EXAM: CHEST  1 VIEW COMPARISON:  Prior chest x-ray obtained earlier today FINDINGS: Interval right-sided thoracentesis with significant reduction in volume of right pleural effusion. No evidence of pneumothorax. Stable cardiomegaly with a left subclavian approach cardiac rhythm maintenance device in place. Atherosclerotic calcifications are present throughout the aorta. A nonspecific cluster of nodular opacities is present in the periphery of the right upper lobe. No pulmonary nodules are evident on the prior chest CT from June 22, 2018. Chronic bronchitic changes and bibasilar atelectasis are noted. No acute osseous abnormality. IMPRESSION: 1. Negative for pneumothorax or other complication following right-sided thoracentesis. 2. Nonspecific cluster of nodular opacities in the periphery of the right lung apex. Recommend further evaluation with dedicated PA and lateral chest x-ray. 3. Cardiomegaly and aortic atherosclerosis. Electronically Signed   By:  Malachy MoanHeath  McCullough M.D.   On: 07/05/2019 15:04   Ir Thoracentesis Asp Pleural Space W/img Guide  Result Date: 07/05/2019 INDICATION: Patient with history of HF exacerbation, dyspnea, and recurrent right pleural effusion. Request is made for therapeutic right thoracentesis. EXAM: ULTRASOUND GUIDED THERAPEUTIC RIGHT THORACENTESIS MEDICATIONS: 10 mL 1% lidocaine COMPLICATIONS: None immediate. PROCEDURE: An ultrasound guided thoracentesis was thoroughly discussed with the patient and questions answered. The benefits, risks, alternatives and complications were also discussed. The patient understands and wishes to proceed with the procedure. Written consent was obtained. Ultrasound was performed to localize and mark an adequate pocket of fluid in the right chest. The area was then prepped and draped in the normal sterile fashion. 1% Lidocaine was used for local anesthesia. Under ultrasound guidance a 6 Fr Safe-T-Centesis catheter was introduced. Thoracentesis was performed. The catheter was removed and a dressing applied. FINDINGS: A total of approximately 1.35 L of clear gold fluid was removed. IMPRESSION: Successful ultrasound guided right thoracentesis yielding 1.35 L of pleural fluid. Read by: Elwin MochaAlexandra Louk, PA-C Electronically Signed   By: Gilmer MorJaime  Wagner D.O.   On: 07/05/2019 15:22    Cardiac Studies   TTE personally reviewed: severe global LV hypokinesis (EF 25-30%), mild RV systolic dysfunction, mod-severe MR, mild-mod AS, mild AI  Patient Profile     85 y.o. female  with history of chronic diastolic CHF with recurrent bilateral pleural effusion s/p thoracentesis in 06/2018 and 09/2018, persistent atrial fibrillation on Coumadin, sinus node dysfunction s/p Biotronik PPM, prior CVA, and orthostatic hypotension who presented with acute systolic heart failure  Assessment & Plan    Acute systolic heart failure: EF 25-30% on TTE.  Suspect tachycardia-induced as device interrogation shows elevated rates  for last several months; also with frequent (76%) RV pacing which may be contributing.  Ischemia also on differential - Net negative 500cc yesterday but with some unmeasured UOP: would continue IV diuresis with lasix 40 mg BID for goal net negative 1-2L - Can consider ischemia evaluation once more euvolemic - Continue toprol XL - Hold off on ACE/ARB/ARNI for now given soft BPs   Persistent Atrial Fibrillation/ Sinus Node Dysfunction s/p PPM: device interrogation shows underlying rate is AF, and despite mode VVI 60 was having frequent RV pacing due to CLS mode.  - Appreciate EP assistance, CLS mode was turned off, no longer RV pacing - Toprol XL 100 mg daily and loading with digoxin - Continue chronic anticoagulation with Coumadin. Dosing per pharmacy.   Recurrent Pleural Effusion: s/p thoracentesis - Continue diuresis as above.     For questions or updates, please contact [facility] Please consult [email or web address] for contact info under  Signed, Donato Heinz, MD  07/07/2019, 10:17 AM

## 2019-07-07 NOTE — Evaluation (Signed)
Occupational Therapy Evaluation Patient Details Name: Carol Snyder MRN: [id number] DOB: 1929-06-09 Today's Date: 07/07/2019    History of Present Illness Patient is an 85 year old female admitted with SOB, LE edema. Patient with CHF, s/p thoracentsis 8/18 and this past nevember. PMH to include afib, CVA, pacemaker.   Clinical Impression   This 85 y/o female presents with the above. PTA pt reports mod independence with ADL and functional mobility using RW. Pt performing functional mobility using RW, standing grooming ADL overall at minguard-supervision level today. She currently requires minA for toileting and LB ADL. Trialled pt on RA this session with O2 sats >/=94%, increased HR noted up to 130s with standing activity. Pt reports has good support at home between family/neighbors who ensure 24hr supervision/assist. Pt's daughter present and supportive during this session. She will benefit from continued acute OT services to maximize her safety and independence with ADL and mobility prior to return home. Will follow.     Follow Up Recommendations  No OT follow up;Supervision/Assistance - 24 hour    Equipment Recommendations  None recommended by OT(pt's DME needs are met)           Precautions / Restrictions Precautions Precautions: Fall Precaution Comments: watch O2 sats, HR Restrictions Weight Bearing Restrictions: No      Mobility Bed Mobility Overal bed mobility: Modified Independent             General bed mobility comments: use of bed rails  Transfers Overall transfer level: Needs assistance Equipment used: Rolling walker (2 wheeled) Transfers: Sit to/from Stand Sit to Stand: Min guard;Supervision         General transfer comment: for balance/safety    Balance Overall balance assessment: Mild deficits observed, not formally tested                                         ADL either performed or assessed with clinical judgement   ADL  Overall ADL's : Needs assistance/impaired Eating/Feeding: Modified independent;Sitting   Grooming: Supervision/safety;Standing;Wash/dry hands;Oral care   Upper Body Bathing: Modified independent;Sitting   Lower Body Bathing: Supervison/ safety;Sit to/from stand   Upper Body Dressing : Set up;Sitting Upper Body Dressing Details (indicate cue type and reason): donning second gown Lower Body Dressing: Min guard;Sit to/from stand   Toilet Transfer: Min guard;Ambulation;RW;BSC Toilet Transfer Details (indicate cue type and reason): pt using BSC, completed room/hallway mobility piror  Toileting- Clothing Manipulation and Hygiene: Minimal assistance;Sit to/from stand;Sitting/lateral lean Toileting - Clothing Manipulation Details (indicate cue type and reason): light minA for gown mangement; pt performing pericare via lateral leans while seated on BSC     Functional mobility during ADLs: Min guard;Rolling walker General ADL Comments: motivated to engage in therapy session and requesting to perform mobility into hallway in addition to ADL this session                         Pertinent Vitals/Pain Pain Assessment: No/denies pain     Hand Dominance Right   Extremity/Trunk Assessment Upper Extremity Assessment Upper Extremity Assessment: Generalized weakness   Lower Extremity Assessment Lower Extremity Assessment: Defer to PT evaluation       Communication Communication Communication: No difficulties   Cognition Arousal/Alertness: Awake/alert Behavior During Therapy: WFL for tasks assessed/performed Overall Cognitive Status: Within Functional Limits for tasks assessed  General Comments  Trialled pt on RA with O2 sats 94% and greater with activity, HR noted up to the 130s     Exercises     Shoulder Instructions      Home Living Family/patient expects to be discharged to:: Private residence Living Arrangements:  Children;Other relatives Available Help at Discharge: Family;Available 24 hours/day Type of Home: House Home Access: Ramped entrance     Home Layout: One level     Bathroom Shower/Tub: Occupational psychologist: Handicapped height Bathroom Accessibility: Yes   Home Equipment: Environmental consultant - 2 wheels;Walker - 4 wheels;Cane - single point;Wheelchair - [employer];Shower seat - built in          Prior Functioning/Environment Level of Independence: Independent with assistive device(s)        Comments: patient uses RW at baseline. Has family to assist her around the clock if needed.        OT Problem List: Decreased strength;Decreased activity tolerance;Cardiopulmonary status limiting activity;Impaired balance (sitting and/or standing);Decreased knowledge of use of DME or AE      OT Treatment/Interventions: Self-care/ADL training;Therapeutic exercise;Energy conservation;DME and/or AE instruction;Therapeutic activities;Patient/family education;Balance training    OT Goals(Current goals can be found in the care plan section) Acute Rehab OT Goals Patient Stated Goal: to return home OT Goal Formulation: With patient Time For Goal Achievement: 07/21/19 Potential to Achieve Goals: Good  OT Frequency: Min 2X/week   Barriers to D/C:            Co-evaluation              AM-PAC OT "6 Clicks" Daily Activity     Outcome Measure Help from another person eating meals?: None Help from another person taking care of personal grooming?: None Help from another person toileting, which includes using toliet, bedpan, or urinal?: A Little Help from another person bathing (including washing, rinsing, drying)?: A Little Help from another person to put on and taking off regular upper body clothing?: None Help from another person to put on and taking off regular lower body clothing?: A Little 6 Click Score: 21   End of Session Equipment Utilized During Treatment: Gait  belt;Rolling walker Nurse Communication: Mobility status(O2 sats, HR)  Activity Tolerance: Patient tolerated treatment well Patient left: in chair;with call bell/phone within reach;with family/visitor present  OT Visit Diagnosis: Muscle weakness (generalized) (M62.81)                Time: 7035-0093 OT Time Calculation (min): 33 min Charges:  OT General Charges $OT Visit: 1 Visit OT Evaluation $OT Eval Moderate Complexity: 1 Mod OT Treatments $Self Care/Home Management : 8-22 mins  Lou Cal, OT Supplemental Rehabilitation Services Pager [phone] Office [phone]    Raymondo Band 07/07/2019, 1:48 PM

## 2019-07-08 LAB — BASIC METABOLIC PANEL
Anion gap: 10 (ref 5–15)
BUN: 20 mg/dL (ref 8–23)
CO2: 27 mmol/L (ref 22–32)
Calcium: 8.7 mg/dL — ABNORMAL LOW (ref 8.9–10.3)
Chloride: 100 mmol/L (ref 98–111)
Creatinine, Ser: 0.8 mg/dL (ref 0.44–1.00)
GFR calc Af Amer: >60 mL/min (ref >60)
GFR calc non Af Amer: >60 mL/min (ref >60)
Glucose, Bld: 92 mg/dL (ref 70–99)
Potassium: 3.9 mmol/L (ref 3.5–5.1)
Sodium: 137 mmol/L (ref 135–145)

## 2019-07-08 LAB — PROTIME-INR
INR: 2.5 — ABNORMAL HIGH (ref 0.8–1.2)
Prothrombin Time: 26.8 seconds — ABNORMAL HIGH (ref 11.4–15.2)

## 2019-07-08 LAB — MAGNESIUM: Magnesium: 1.7 mg/dL (ref 1.7–2.4)

## 2019-07-08 MED ORDER — MAGNESIUM SULFATE 2 GM/50ML IV SOLN
2.0000 g | Freq: Once | INTRAVENOUS | Status: AC
  Administered 2019-07-08: 2 g via INTRAVENOUS
  Filled 2019-07-08: qty 50

## 2019-07-08 MED ORDER — WARFARIN SODIUM 2.5 MG PO TABS
2.5000 mg | ORAL_TABLET | Freq: Once | ORAL | Status: AC
  Administered 2019-07-08: 2.5 mg via ORAL
  Filled 2019-07-08: qty 1

## 2019-07-08 MED ORDER — SODIUM CHLORIDE 0.9 % IV SOLN
INTRAVENOUS | Status: DC | PRN
  Administered 2019-07-08: 250 mL via INTRAVENOUS

## 2019-07-08 MED ORDER — FUROSEMIDE 80 MG PO TABS
80.0000 mg | ORAL_TABLET | Freq: Every day | ORAL | Status: DC
  Administered 2019-07-09: 80 mg via ORAL
  Filled 2019-07-08: qty 1

## 2019-07-08 NOTE — TOC Progression Note (Signed)
Transition of Care [facility]) - Progression Note    Patient Details  Name: Carol Snyder MRN: [id number] Date of Birth: 03-19-1929  Transition of [facility]) CM/SW Contact  Zenon Mayo, RN Phone Number: 07/08/2019, 6:24 PM  Clinical Narrative:    From home alone, CHF, Sob, R pl effusion,s/p thoracentesis, has a pace maker, Cards following, pt eval rec HHPT, need to offer choice.  TOC team will cotinue to monitor for toc needs.        Expected Discharge Plan and Services                                                 Social Determinants of Health (SDOH) Interventions    Readmission Risk Interventions No flowsheet data found.

## 2019-07-08 NOTE — Progress Notes (Signed)
Subjective:   Mrs. Ey reports she is doing well this morning. She did have a small headache last night that was alleviated by Tylenol. She notes that she usually take Tylenol every night as it helps her sleep. No other acute complaints at this time.   Objective:  Vital signs in last 24 hours: Vitals:   07/07/19 2043 07/08/19 0031 07/08/19 0232 07/08/19 0501  BP: (!) 98/59 100/68  106/67  Pulse: 68 62  69  Resp: 18 20  16   Temp: 98.1 F (36.7 C) 98.7 F (37.1 C)  97.8 F (36.6 C)  TempSrc: Oral Oral  Oral  SpO2: 94% 95%  95%  Weight:   45.4 kg   Height:       Filed Weights   07/06/19 0539 07/07/19 0734 07/08/19 0232  Weight: 45.9 kg 45.1 kg 45.4 kg    Intake/Output Summary (Last 24 hours) at 07/08/2019 4403 Last data filed at 07/08/2019 0248 Gross per 24 hour  Intake 243 ml  Output 1701 ml  Net -1458 ml    Physical Exam Constitutional:      General: She is not in acute distress.    Appearance: She is normal weight.  Cardiovascular:     Rate and Rhythm: Normal rate. Rhythm irregular.     Heart sounds: Murmur (systolic 2/6) present.  Pulmonary:     Effort: Pulmonary effort is normal. No accessory muscle usage or respiratory distress.     Breath sounds: Normal breath sounds. No decreased breath sounds, wheezing, rhonchi or rales.  Skin:    General: Skin is warm and dry.  Neurological:     General: No focal deficit present.     Mental Status: She is alert.    Assessment/Plan:  Principal Problem:   Acute on chronic systolic congestive heart failure (HCC) Active Problems:   Atrial fibrillation, chronic   Pacemaker   Recurrent right pleural effusion   # Acute on Chronic combined Heart Failure:  # Recurrent Pleural effusion Echo on admission shows new changes including reduced EF of 25-30% and left ventricular wall abnormality.  Cardiology does not feel she is a good candidate for catheterization.  Given her decreased EF, diltiazem was swapped for metoprolol.    Continue diuresing with Lasix with goal of resuming home dose of Lasix, which was Lasix 80mg  QD. She was able to have a net output of 1.5L over the past 24 hours. She was able to be weaned to room air and satting well. Cardiology mentioned an ischemic work up, will follow up their note if that is something they would like to pursue inpatient. If not, she may be ready for discharge.    - Cardiology + EP on board and we appreciate their recommendations - Metoprolol 100mg  QD - Lasix IV 40mg  BID . Possible transition to PO. - Strict ins and outs   # Persistent A. Fib # Sinus Node Dysfunction s/p PPM V-pacing rhythm while in the ED. EP recommends starting digoxin for better rate control and have discontinued CLS.  She is tolerating Digoxin well at this time.    - EP is on board and we appreciate their recommendations. - Digoxin 0.0625mg  daily - Repeat dig level in 2-3 weeks - Warfarin per pharmacy dosing   # Chronic Diarrhea:  Stable at this time.  Continue home meds.   - Cholestyramine, 4g Daily (prescribed as TID, but takes Daily),  - Loperamide 4mg  Daily - Lomotil TID PRN      Dispo: Anticipated discharge  pending cardiology recommendations.   Dr. Verdene LennertIulia Melvina Pangelinan Internal Medicine PGY-1  Pager: [phone] 07/08/2019, 6:37 AM

## 2019-07-08 NOTE — Progress Notes (Signed)
Progress Note  Patient Name: Carol AmorSara K Poorman Date of Encounter: 07/08/2019  Primary Cardiologist: Donato SchultzMark Skains, MD   Subjective  Denies any dyspnea, chest pain, or palpitations this morning.   Inpatient Medications    Scheduled Meds: . albuterol  5 mg Nebulization Once  . cholestyramine  4 g Oral Daily  . digoxin  0.0625 mg Oral Daily  . feeding supplement  1 Container Oral TID BM  . furosemide  40 mg Intravenous BID  . loperamide  4 mg Oral Daily  . metoprolol succinate  100 mg Oral Daily  . multivitamin with minerals  1 tablet Oral Daily  . potassium chloride SA  20 mEq Oral Daily  . sodium chloride flush  3 mL Intravenous Q12H  . Warfarin - Pharmacist Dosing Inpatient   Does not apply q1800   Continuous Infusions:  PRN Meds: acetaminophen **OR** acetaminophen, diphenoxylate-atropine, hydroxypropyl methylcellulose / hypromellose, lidocaine (PF)   Vital Signs    Vitals:   07/08/19 0031 07/08/19 0232 07/08/19 0501 07/08/19 0856  BP: 100/68  106/67 110/62  Pulse: 62  69 70  Resp: 20  16   Temp: 98.7 F (37.1 C)  97.8 F (36.6 C)   TempSrc: Oral  Oral   SpO2: 95%  95%   Weight:  45.4 kg    Height:        Intake/Output Summary (Last 24 hours) at 07/08/2019 0929 Last data filed at 07/08/2019 0913 Gross per 24 hour  Intake 246 ml  Output 1501 ml  Net -1255 ml   Last 3 Weights 07/08/2019 07/07/2019 07/06/2019  Weight (lbs) 100 lb 1.6 oz 99 lb 8 oz 101 lb 3.1 oz  Weight (kg) 45.405 kg 45.133 kg 45.9 kg      Telemetry  Personally Reviewed: no longer V-pacing, now AF with RVR, rates 90-110s.  ECG    V-paced and AF - Personally Reviewed  Physical Exam   GEN: No acute distress.   Neck: No JVD Cardiac: irregular, tachycardic, no murmurs Respiratory: Dimished at bases, crackles at left lung base GI: Soft, nontender, non-distended  MS: No edema; No deformity. Neuro:  Nonfocal  Psych: Normal affect   Labs    High Sensitivity Troponin:   Recent Labs  Lab  07/05/19 0830 07/05/19 1109  TROPONINIHS 8 7      Cardiac EnzymesNo results for input(s): TROPONINI in the last 168 hours. No results for input(s): TROPIPOC in the last 168 hours.   Chemistry Recent Labs  Lab 07/06/19 0400 07/07/19 0620 07/08/19 0347  NA 140 139 137  K 3.5 4.3 3.9  CL 102 101 100  CO2 26 30 27   GLUCOSE 101* 93 92  BUN 15 20 20   CREATININE 0.88 0.98 0.80  CALCIUM 8.9 8.8* 8.7*  GFRNONAA 59* 52* >60  GFRAA >60 60* >60  ANIONGAP 12 8 10      Hematology Recent Labs  Lab 07/05/19 0830  WBC 6.2  RBC 3.56*  HGB 10.7*  HCT 34.5*  MCV 96.9  MCH 30.1  MCHC 31.0  RDW 13.2  PLT 169    BNP Recent Labs  Lab 07/05/19 0830  BNP 1,259.5*     DDimer No results for input(s): DDIMER in the last 168 hours.   Radiology    No results found.  Cardiac Studies   TTE personally reviewed: severe global LV hypokinesis (EF 25-30%), mild RV systolic dysfunction, mod-severe MR, mild-mod AS, mild AI  Patient Profile     85 y.o. female  with  history of chronic diastolic CHF with recurrent bilateral pleural effusion s/p thoracentesis in 06/2018 and 09/2018, persistent atrial fibrillation on Coumadin, sinus node dysfunction s/p Biotronik PPM, prior CVA, and orthostatic hypotension who presented with acute systolic heart failure  Assessment & Plan    Acute systolic heart failure: EF 25-30% on TTE, new diagnosis.  Suspect tachycardia-induced as device interrogation shows elevated rates for last several months; also with frequent (76%) RV pacing which may be contributing.  Ischemia also on differential - Net negative 1.3L yesterday on IV lasix 40 mg BID.  Appears euvolemic on exam.  Received IV lasix 40mg  this morning, will transition to PO lasix 80 mg daily tomorrow - Plan for ischemia evaluation with nuclear stress test.  Will make NPO for stress test tomorrow - Continue toprol XL - Hold off on ACE/ARB/ARNI for now given soft BPs  Persistent Atrial Fibrillation/ Sinus  Node Dysfunction s/p PPM: device interrogation shows underlying rate is AF, and despite mode VVI 60 was having frequent RV pacing due to CLS mode.  - Appreciate EP assistance, CLS mode was turned off, no longer RV pacing - Toprol XL 100 mg daily and started digoxin.  Check level in 2 weeks - Continue chronic anticoagulation with Coumadin. Dosing per pharmacy.   Recurrent Pleural Effusion: s/p thoracentesis - Continue diuresis as above.     For questions or updates, please contact [place] Please consult [email or web address] for contact info under        Signed, Donato Heinz, MD  07/08/2019, 9:29 AM

## 2019-07-08 NOTE — Progress Notes (Signed)
ANTICOAGULATION CONSULT NOTE Pharmacy Consult for warfarin Indication: atrial fibrillation  Patient Measurements: Height: 5\' 7"  (170.2 cm) Weight: 100 lb 1.6 oz (45.4 kg)(scale B) IBW/kg (Calculated) : 61.6  Vital Signs: Temp: 97.8 F (36.6 C) (08/21 0501) Temp Source: Oral (08/21 0501) BP: 110/62 (08/21 0856) Pulse Rate: 70 (08/21 0856)  Labs: Recent Labs    07/05/19 1109 07/06/19 0400 07/07/19 0620 07/08/19 0347  LABPROT  --  22.8* 25.7* 26.8*  INR  --  2.1* 2.4* 2.5*  CREATININE  --  0.88 0.98 0.80  TROPONINIHS 7  --   --   --     Estimated Creatinine Clearance: 34.8 mL/min (by C-G formula based on SCr of 0.8 mg/dL).    Assessment: 36 yof presented to the ED with shortness of breath. She is on chronic warfarin for history of afib. INR is therapeutic at 2.5  Goal of Therapy:  INR 2-3 Monitor platelets by anticoagulation protocol: Yes   Plan:  Warfarin 2.5mg  PO x 1 tonight Daily INR  Emeline General, PharmD Candidate Please see AMION for all Pharmacists' Contact Phone Numbers 07/08/2019, 10:04 AM   I discussed / reviewed the pharmacy note by Dr. Toula Moos and I agree with the resident's findings and plans as documented.  Anette Guarneri, PharmD

## 2019-07-08 NOTE — Progress Notes (Signed)
Physical Therapy Treatment Patient Details Name: Carol Snyder MRN: [id number] DOB: 07-16-1929 Today's Date: 07/08/2019    History of Present Illness Patient is an 85 year old female admitted with SOB, LE edema. Patient with CHF, s/p thoracentsis 8/18 and this past nevember. PMH to include afib, CVA, pacemaker.    PT Comments    Patient progressing well towards PT goals. Improved ambulation distance with Min guard assist for balance/safety. Sp02 remained >93% on RA and HR ranged from 103-129 bpm. Pt able to donn/doff socks/shoes EOB without assist. Pt feels great and excited to return home. Will continue to follow and progress as tolerated.   Follow Up Recommendations  Home health PT     Equipment Recommendations  None recommended by PT    Recommendations for Other Services       Precautions / Restrictions Precautions Precautions: Fall Precaution Comments: watch HR Restrictions Weight Bearing Restrictions: No    Mobility  Bed Mobility Overal bed mobility: Modified Independent             General bed mobility comments: use of bed rails  Transfers Overall transfer level: Needs assistance Equipment used: Rolling walker (2 wheeled) Transfers: Sit to/from Stand Sit to Stand: Supervision         General transfer comment: Supervision for safety.  Ambulation/Gait Ambulation/Gait assistance: Min guard Gait Distance (Feet): 170 Feet Assistive device: Rolling walker (2 wheeled) Gait Pattern/deviations: Step-to pattern Gait velocity: decreased   General Gait Details: ambulates with shoes on due to leg length descrepancy; slow steady gait with RW. Sp02 remained >93% on RA. HR ranged from 103-128 bpm   Stairs             Wheelchair Mobility    Modified Rankin (Stroke Patients Only)       Balance Overall balance assessment: Needs assistance Sitting-balance support: Feet supported;No upper extremity supported Sitting balance-Leahy Scale: Good Sitting  balance - Comments: Able to donn shoes sitting EOB and doff socks.   Standing balance support: During functional activity Standing balance-Leahy Scale: Poor Standing balance comment: Requires UE support for safety.                            Cognition Arousal/Alertness: Awake/alert Behavior During Therapy: WFL for tasks assessed/performed Overall Cognitive Status: Within Functional Limits for tasks assessed                                        Exercises      General Comments General comments (skin integrity, edema, etc.): Sp02 >93% on RA; Hr up to 128 bpm.      Pertinent Vitals/Pain Pain Assessment: No/denies pain    Home Living                      Prior Function            PT Goals (current goals can now be found in the care plan section) Progress towards PT goals: Progressing toward goals    Frequency    Min 2X/week      PT Plan Current plan remains appropriate    Co-evaluation              AM-PAC PT "6 Clicks" Mobility   Outcome Measure  Help needed turning from your back to your side while in a flat bed without using  bedrails?: A Little Help needed moving from lying on your back to sitting on the side of a flat bed without using bedrails?: A Little Help needed moving to and from a bed to a chair (including a wheelchair)?: A Little Help needed standing up from a chair using your arms (e.g., wheelchair or bedside chair)?: A Little Help needed to walk in hospital room?: A Little Help needed climbing 3-5 steps with a railing? : A Little 6 Click Score: 18    End of Session Equipment Utilized During Treatment: Gait belt Activity Tolerance: Patient tolerated treatment well Patient left: in bed;with call bell/phone within reach;with bed alarm set Nurse Communication: Mobility status PT Visit Diagnosis: Difficulty in walking, not elsewhere classified (R26.2)     Time: [id number] PT Time Calculation (min) (ACUTE  ONLY): 46 min  Charges:  $Gait Training: 23-37 mins $Therapeutic Activity: 8-22 mins                     Mylo RedShauna Lynze Reddy, PT, DPT Acute Rehabilitation Services Pager [phone] Office [phone]       Blake DivineShauna A Lanier EnsignHartshorne 07/08/2019, 10:25 AM

## 2019-07-09 ENCOUNTER — Inpatient Hospital Stay (HOSPITAL_COMMUNITY): Payer: Medicare Other

## 2019-07-09 DIAGNOSIS — I5023 Acute on chronic systolic (congestive) heart failure: Secondary | ICD-10-CM

## 2019-07-09 DIAGNOSIS — I4891 Unspecified atrial fibrillation: Secondary | ICD-10-CM

## 2019-07-09 DIAGNOSIS — J90 Pleural effusion, not elsewhere classified: Secondary | ICD-10-CM

## 2019-07-09 DIAGNOSIS — I509 Heart failure, unspecified: Secondary | ICD-10-CM

## 2019-07-09 LAB — NM MYOCAR MULTI W/SPECT W/WALL MOTION / EF
Estimated workload: 1 METS
Exercise duration (min): 5 min
Exercise duration (sec): 18 s
MPHR: 132 {beats}/min
Peak HR: 100 {beats}/min
Percent HR: 75 %
Rest HR: 86 {beats}/min

## 2019-07-09 LAB — BASIC METABOLIC PANEL
Anion gap: 9 (ref 5–15)
BUN: 22 mg/dL (ref 8–23)
CO2: 27 mmol/L (ref 22–32)
Calcium: 9 mg/dL (ref 8.9–10.3)
Chloride: 100 mmol/L (ref 98–111)
Creatinine, Ser: 0.88 mg/dL (ref 0.44–1.00)
GFR calc Af Amer: >60 mL/min (ref >60)
GFR calc non Af Amer: 59 mL/min — ABNORMAL LOW (ref >60)
Glucose, Bld: 94 mg/dL (ref 70–99)
Potassium: 4.3 mmol/L (ref 3.5–5.1)
Sodium: 136 mmol/L (ref 135–145)

## 2019-07-09 LAB — MAGNESIUM: Magnesium: 2.2 mg/dL (ref 1.7–2.4)

## 2019-07-09 LAB — PROTIME-INR
INR: 2.4 — ABNORMAL HIGH (ref 0.8–1.2)
Prothrombin Time: 25.7 seconds — ABNORMAL HIGH (ref 11.4–15.2)

## 2019-07-09 MED ORDER — METOPROLOL SUCCINATE ER 100 MG PO TB24: 100.0000 mg | ORAL_TABLET | Freq: Every day | ORAL | 0 refills | Status: DC

## 2019-07-09 MED ORDER — WARFARIN SODIUM 2.5 MG PO TABS: 2.5000 mg | ORAL_TABLET | Freq: Once | ORAL | Status: DC

## 2019-07-09 MED ORDER — TECHNETIUM TC 99M TETROFOSMIN IV KIT
10.0000 | PACK | Freq: Once | INTRAVENOUS | Status: AC | PRN
  Administered 2019-07-09: 10 via INTRAVENOUS

## 2019-07-09 MED ORDER — DIGOXIN 62.5 MCG PO TABS: 0.0625 mg | ORAL_TABLET | Freq: Every day | ORAL | 0 refills | Status: DC

## 2019-07-09 MED ORDER — REGADENOSON 0.4 MG/5ML IV SOLN
0.4000 mg | Freq: Once | INTRAVENOUS | Status: AC
  Administered 2019-07-09: 0.4 mg via INTRAVENOUS

## 2019-07-09 MED ORDER — REGADENOSON 0.4 MG/5ML IV SOLN
INTRAVENOUS | Status: AC
  Filled 2019-07-09: qty 5

## 2019-07-09 NOTE — Progress Notes (Addendum)
Patient went for NM stress test today, study is high risk. Discussed the case with cardiology. She was originally seen by Dr. Angelena Form on 8/18 and at that time thought to be a poor candidate for cath and he recommended medical management. Given her comorbidies and functional status, it is unlikely that she would be a CABG candidate if her LHC illustrated multivessel disease. Also, the patient currently does not have any anginal symptoms. Therefore, in coordination with cardiology, we think the best course of action would be medical management.   The patient is currently on Metoprolol succinate 100 mg QD and furosemide 80 mg QD. Her blood pressure seems to be variable but we may be able to decrease her furosemide and get her started on ACE/ARB/ARNi therapy. She will follow-up with cardiology as an outpatient.   Ina Homes, MD IMTS PGY3  Pager: [phone]

## 2019-07-09 NOTE — Progress Notes (Signed)
   Subjective:   Carol Snyder reports the nuclear exam went okay. It was cold in the room and the backboard was painful to lay on for her hip. She is glad it's over. No other acute complaints at this time.   Objective:  Vital signs in last 24 hours: Vitals:   07/08/19 1132 07/08/19 1658 07/08/19 2001 07/09/19 0500  BP: 103/68 95/64 104/61 99/63  Pulse: 90 84 98 83  Resp: 17 18 15 14   Temp: 97.9 F (36.6 C) 97.6 F (36.4 C) 98.2 F (36.8 C) 98 F (36.7 C)  TempSrc: Oral Oral Oral Oral  SpO2: 95% 94% 95% 94%  Weight:    45.5 kg  Height:       Filed Weights   07/07/19 0734 07/08/19 0232 07/09/19 0500  Weight: 45.1 kg 45.4 kg 45.5 kg    Intake/Output Summary (Last 24 hours) at 07/09/2019 0644 Last data filed at 07/09/2019 0300 Gross per 24 hour  Intake 1097.97 ml  Output 1775 ml  Net -677.03 ml    Physical Exam Constitutional:      General: She is not in acute distress.    Appearance: She is normal weight.  Cardiovascular:     Rate and Rhythm: Normal rate. Rhythm irregular.     Heart sounds: Murmur (systolic 2/6) present.  Pulmonary:     Effort: Pulmonary effort is normal. No accessory muscle usage or respiratory distress.     Breath sounds: Normal breath sounds. No decreased breath sounds, wheezing, rhonchi or rales.  Skin:    General: Skin is warm and dry.  Neurological:     General: No focal deficit present.     Mental Status: She is alert.    Assessment/Plan:  Principal Problem:   Acute on chronic systolic congestive heart failure (HCC) Active Problems:   Atrial fibrillation, chronic   Pacemaker   Recurrent right pleural effusion   # Acute on Chronic combined Heart Failure:  # Recurrent Pleural effusion Echo on admission shows new changes including reduced EF of 25-30% and left ventricular wall abnormality.  Cardiology does not feel she is a good candidate for catheterization.  Given her decreased EF, diltiazem was swapped for metoprolol.   Patient  tolerated the nuclear imaging well. Results confirm ischemia in the inferior aspect, high risk. Cardiology previously noted that she is not a cath candidate. Unless cardiology plans to pursue further work up, plan to discharge patient at this time. Will follow up with cards about this.    - Cardiology + EP on board and we appreciate their recommendations - Metoprolol 100mg  QD - Lasix 80mg  PO - Strict ins and outs   # Persistent A. Fib # Sinus Node Dysfunction s/p PPM V-pacing rhythm while in the ED. EP recommends starting digoxin for better rate control and have discontinued CLS.  She is tolerating Digoxin well at this time.    - EP is on board and we appreciate their recommendations. - Digoxin 0.0625mg  daily - Repeat dig level in 2-3 weeks - Warfarin per pharmacy dosing   # Chronic Diarrhea:  Stable at this time.  Continue home meds.   - Cholestyramine, 4g Daily (prescribed as TID, but takes Daily),  - Loperamide 4mg  Daily - Lomotil TID PRN      Dispo: Anticipated discharge pending cardiology recommendations.   Dr. Jose Persia Internal Medicine PGY-1  Pager: [phone] 07/09/2019, 6:44 AM

## 2019-07-09 NOTE — Progress Notes (Signed)
Progress Note  Patient Name: Carol Snyder Date of Encounter: 07/09/2019  Primary Cardiologist: Donato SchultzMark Skains, MD  Patient currently off the floor for a Lexiscan Myoview.  Inpatient Medications    Scheduled Meds: . regadenoson      . albuterol  5 mg Nebulization Once  . cholestyramine  4 g Oral Daily  . digoxin  0.0625 mg Oral Daily  . feeding supplement  1 Container Oral TID BM  . furosemide  80 mg Oral Daily  . loperamide  4 mg Oral Daily  . metoprolol succinate  100 mg Oral Daily  . multivitamin with minerals  1 tablet Oral Daily  . potassium chloride SA  20 mEq Oral Daily  . regadenoson  0.4 mg Intravenous Once  . sodium chloride flush  3 mL Intravenous Q12H  . Warfarin - Pharmacist Dosing Inpatient   Does not apply q1800   Continuous Infusions: . sodium chloride Stopped (07/08/19 2140)   PRN Meds: sodium chloride, acetaminophen **OR** acetaminophen, diphenoxylate-atropine, hydroxypropyl methylcellulose / hypromellose, lidocaine (PF)   Vital Signs    Vitals:   07/08/19 1658 07/08/19 2001 07/09/19 0500 07/09/19 0859  BP: 95/64 104/61 99/63 (!) 151/59  Pulse: 84 98 83   Resp: 18 15 14    Temp: 97.6 F (36.4 C) 98.2 F (36.8 C) 98 F (36.7 C)   TempSrc: Oral Oral Oral   SpO2: 94% 95% 94%   Weight:   45.5 kg   Height:        Intake/Output Summary (Last 24 hours) at 07/09/2019 0909 Last data filed at 07/09/2019 0300 Gross per 24 hour  Intake 1094.97 ml  Output 1575 ml  Net -480.03 ml   Filed Weights   07/07/19 0734 07/08/19 0232 07/09/19 0500  Weight: 45.1 kg 45.4 kg 45.5 kg    ECG    An ECG dated 07/03/2019 was personally reviewed today and demonstrated:  Probable atrial fibrillation with ventricular pacing.  Labs    Chemistry Recent Labs  Lab 07/07/19 0620 07/08/19 0347 07/09/19 0335  NA 139 137 136  K 4.3 3.9 4.3  CL 101 100 100  CO2 30 27 27   GLUCOSE 93 92 94  BUN 20 20 22   CREATININE 0.98 0.80 0.88  CALCIUM 8.8* 8.7* 9.0  GFRNONAA 52*  >60 59*  GFRAA 60* >60 >60  ANIONGAP 8 10 9      Hematology Recent Labs  Lab 07/05/19 0830  WBC 6.2  RBC 3.56*  HGB 10.7*  HCT 34.5*  MCV 96.9  MCH 30.1  MCHC 31.0  RDW 13.2  PLT 169    Cardiac Enzymes Recent Labs  Lab 07/05/19 0830 07/05/19 1109  TROPONINIHS 8 7    BNP Recent Labs  Lab 07/05/19 0830  BNP 1,259.5*     Radiology    No results found.  Cardiac Studies   Echocardiogram 07/05/2019:  1. The left ventricle has severely reduced systolic function, with an ejection fraction of 25-30%. The cavity size was normal. Left ventricular diastolic function could not be evaluated secondary to atrial fibrillation. Left ventricular diffuse  hypokinesis.  2. Severe akinesis of the left ventricular, entire inferior wall.  3. The right ventricle has normal systolic function. The cavity was moderately enlarged. There is no increase in right ventricular wall thickness.  4. Left atrial size was severely dilated.  5. Right atrial size was severely dilated.  6. Large pleural effusion in the right lateral region.  7. The mitral valve is abnormal. Moderate thickening of the  mitral valve leaflet. Mitral valve regurgitation is moderate to severe by color flow Doppler.  8. The tricuspid valve is grossly normal. Tricuspid valve regurgitation is mild-moderate.  9. The aortic valve is tricuspid. Mild calcification of the aortic valve. Aortic valve regurgitation is mild by color flow Doppler. Mild-moderate stenosis of the aortic valve. 10. The aorta is normal unless otherwise noted. 11. The inferior vena cava was dilated in size with <50% respiratory variability.  Patient Profile     85 y.o. female with historyofchronic diastolic CHF with recurrent bilateral pleural effusion s/p thoracentesis in 06/2018 and 09/2018, persistent atrial fibrillation on Coumadin, sinus node dysfunction s/p Biotronik PPM, prior CVA, and orthostatic hypotension who presents with acute systolic heart  failure.  Assessment & Plan    Patient currently off the floor for a Lexiscan Myoview.  I reviewed the chart and plan from a cardiology perspective.  Potential stress-induced cardiomyopathy is suspected with LVEF 25 to 30%, however she does have akinetic inferior wall raising the possibility of underlying ischemic heart disease.  For now would continue Toprol-XL and transition to oral Lasix 80 mg daily with potassium supplement.  She is not on aspirin given concurrent use of Coumadin.  Pacemaker adjustments have been made by EP.  We will follow-up on stress test results when available.  Signed, Carol Lesches, MD  07/09/2019, 9:09 AM

## 2019-07-09 NOTE — Progress Notes (Signed)
  Date: 07/09/2019  Patient name: Carol Snyder  Medical record number: [id number]  Date of birth: May 25, 1929   I have seen and evaluated this patient and I have discussed the plan of care with the house staff. Please see their note for complete details. I concur with their findings with the following additions/corrections:   Inferior ischemia on nuclear stress test, will follow up with cardiology regarding medication management vs intervention.  Lenice Pressman, M.D., Ph.D. 07/09/2019, 2:33 PM

## 2019-07-09 NOTE — Progress Notes (Signed)
ANTICOAGULATION CONSULT NOTE Pharmacy Consult for warfarin Indication: atrial fibrillation  Patient Measurements: Height: 5\' 7"  (170.2 cm) Weight: 100 lb 4.8 oz (45.5 kg)(scale b) IBW/kg (Calculated) : 61.6  Vital Signs: Temp: 98.3 F (36.8 C) (08/22 1216) Temp Source: Oral (08/22 1216) BP: 112/56 (08/22 1216) Pulse Rate: 87 (08/22 1216)  Labs: Recent Labs    07/07/19 0620 07/08/19 0347 07/09/19 0335  LABPROT 25.7* 26.8* 25.7*  INR 2.4* 2.5* 2.4*  CREATININE 0.98 0.80 0.88    Estimated Creatinine Clearance: 31.7 mL/min (by C-G formula based on SCr of 0.88 mg/dL).    Assessment: 73 yof presented to the ED with shortness of breath. Found to have acute on chronic CHF. Patient will be managed medically. She is on chronic warfarin for history of afib. INR is therapeutic at 2.4. Patient is stable on current PTA regimen of warfarin 2.5 mg all days, except for 5 mg on sundays. No signs/symptoms of bleeding.  Goal of Therapy:  INR 2-3 Monitor platelets by anticoagulation protocol: Yes   Plan:  Warfarin 2.5mg  PO x 1 tonight Daily INR Watch for signs/symptoms of bleeding  Sherren Kerns, PharmD PGY1 Acute Care Pharmacy Resident [phone]

## 2019-07-09 NOTE — Discharge Summary (Signed)
Name: Carol Snyder MRN: [id number] DOB: 06/13/1929 85 y.o. PCP: Kaleen MaskElkins, Wilson Oliver, MD  Date of Admission: 07/05/2019  8:11 AM Date of Discharge: 07/09/2019 Attending Physician: Anne Shutteraines, Alexander N, MD  Discharge Diagnosis: 1. Acute on Chronic Systolic/Diastolic Heart Failure  2. Persistent Atrial Fibrillation / Sinus Node Dysfunction   Discharge Medications: Allergies as of 07/09/2019      Reactions   Codeine Other (See Comments)   unknown   Enbrel [etanercept] Other (See Comments)   unknown   Fosamax [alendronate Sodium] Other (See Comments)   unknown   Hydromorphone Other (See Comments)   unknown   Motrin [ibuprofen] Other (See Comments)   Due to blood thinner    Sulfa Antibiotics Other (See Comments)   Childhood allergy    Voltaren [diclofenac] Other (See Comments)   unknown   Naproxen Sodium Palpitations      Medication List    STOP taking these medications   diltiazem 180 MG 24 hr capsule Commonly known as: CARDIZEM CD   metoprolol tartrate 25 MG tablet Commonly known as: LOPRESSOR     TAKE these medications   acetaminophen 325 MG tablet Commonly known as: TYLENOL Take 650 mg by mouth every 4 (four) hours as needed for mild pain or fever (or if temp is 99.0 or greater for 72 hours.).   alendronate 70 MG tablet Commonly known as: FOSAMAX Take 70 mg by mouth once a week. Sunday   BIOFREEZE EX Apply 1 application topically daily as needed (pain).   cholestyramine 4 g packet Commonly known as: QUESTRAN Take 4 g by mouth 3 (three) times daily with meals.   Digoxin 62.5 MCG Tabs Take 0.0625 mg by mouth daily.   diphenoxylate-atropine 2.5-0.025 MG tablet Commonly known as: LOMOTIL Take 1 tablet by mouth 4 (four) times daily as needed for diarrhea or loose stools.   furosemide 80 MG tablet Commonly known as: LASIX Take 80 mg by mouth daily.   hydroxypropyl methylcellulose / hypromellose 2.5 % ophthalmic solution Commonly known as: ISOPTO TEARS /  GONIOVISC Place 1 drop into both eyes 3 (three) times daily as needed for dry eyes.   loperamide 2 MG capsule Commonly known as: IMODIUM Take 4 mg by mouth daily.   metoprolol succinate 100 MG 24 hr tablet Commonly known as: TOPROL-XL Take 1 tablet (100 mg total) by mouth daily. Take with or immediately following a meal. Start taking on: July 10, 2019   potassium chloride SA 20 MEQ tablet Commonly known as: K-DUR Take 1 tablet (20 mEq total) by mouth daily.   warfarin 5 MG tablet Commonly known as: COUMADIN Take 2.5-5 mg by mouth See admin instructions. Take 2.5 mg  All the other days at bedtime Take 5 mg on Sunday     Disposition and follow-up:   [facility] Snyder [facility] St[facility].  At the hospital follow up visit please address:  1.  HFrEF. Continue medical management. Try to decrease the patient's lasix in order to add more guideline directed therapy. Her BP will limit how much she can tolerate. Discuss a heart failure action plan. A-fib/Sinus node dysfunction. Recheck a digoxin level in 2-3 weeks.  2.  Labs / imaging needed at time of follow-up: BMP, digoxin level in 2-3 weeks  3.  Pending labs/ test needing follow-up: None  Follow-up Appointments: Follow-up Information    Jake BatheSkains, Mark C, MD Follow up.   Specialty: Cardiology Why: Follow up within 1 week Contact  information: [address] [place] [address] [phone]        Leonard Downing, MD Follow up.   Specialty: Family Medicine Why: Follow up as needed Contact information: [place] [address] [place] [facility] Course by problem list:  1. Acute on Chronic Systolic/Diastolic Heart Failure. [name] is an 85 year old female with a history of heart failure preserved ejection fraction, a fib, and sinus node dysfunction with PPM in place who presented to the emergency department with  progressive shortness of breath, PND, and increased lower extremity edema. In the emergency department she was found to have vascular congestion on her chest x-ray and an elevated BNP. Echocardiogram was obtained that illustrated a newly reduced LVEF of 25 to 30%. She was continued on her home metoprolol 100 mg daily and started on IV Lasix. She diuresis approximately 4.5 L of her hospitalization. Cardiology was consulted and recommended medical management but did pursue a nuclear medicine stress test that illustrated a large defect in the basal inferoseptal, basal inferior, basal inferolateral, mid inferoseptal, mid inferior and mid inferolateral location. She was felt not to be a candidate for further intervention and medical management was recommended. She was discharged in stable condition and will follow-up with her cardiologist.  2. Persistent Atrial Fibrillation / Sinus Node Dysfunction. On admission the patient's EKG illustrated continuous ventricular pacing. Review of telemetry illustrated that the patient was going from sinus rhythm to atrial pacing to ventricular pacing. EP was consulted and recommended starting digoxin for better rate control and discontinuing her CLS. She tolerated these changes well and her pacing burden significantly decreased. She was discharged on digoxin 0.0625 mg daily. She will need repeat digoxin level in 2 to 3 weeks. She was continued on warfarin for anticoagulation.  Discharge Vitals:   BP (!) 112/56 (BP Location: Left Arm)   Pulse 87   Temp 98.3 F (36.8 C) (Oral)   Resp 19   Ht 5\' 7"  (1.702 m)   Wt 45.5 kg Comment: scale b  SpO2 96%   BMI 15.71 kg/m   Pertinent Labs, Studies, and Procedures:   TTE 07/05/2019  1. The left ventricle has severely reduced systolic function, with an ejection fraction of 25-30%. The cavity size was normal. Left ventricular diastolic function could not be evaluated secondary to atrial fibrillation. Left ventricular diffuse   hypokinesis.  2. Severe akinesis of the left ventricular, entire inferior wall.  3. The right ventricle has normal systolic function. The cavity was moderately enlarged. There is no increase in right ventricular wall thickness.  4. Left atrial size was severely dilated.  5. Right atrial size was severely dilated.  6. Large pleural effusion in the right lateral region.  7. The mitral valve is abnormal. Moderate thickening of the mitral valve leaflet. Mitral valve regurgitation is moderate to severe by color flow Doppler.  8. The tricuspid valve is grossly normal. Tricuspid valve regurgitation is mild-moderate.  9. The aortic valve is tricuspid. Mild calcification of the aortic valve. Aortic valve regurgitation is mild by color flow Doppler. Mild-moderate stenosis of the aortic valve. 10. The aorta is normal unless otherwise noted. 11. The inferior vena cava was dilated in size with <50% respiratory variability.  NM Myoview 07/09/2019  There was no ST segment deviation noted during stress.  T wave inversion was noted during stress in the II, III and aVF leads.  Defect 1: There is a large defect of  moderate severity present in the basal inferoseptal, basal inferior, basal inferolateral, mid inferoseptal, mid inferior and mid inferolateral location.  Findings consistent with subendocardial MI with peri-infarct ischemia of the inferior . inferioseptal and inferolateral walls.  Nuclear stress EF: 33%. The left ventricular ejection fraction is moderately decreased (30-44%).  This is a high risk study.  Discharge Instructions: Discharge Instructions    (HEART FAILURE PATIENTS) Call MD:  Anytime you have any of the following symptoms: 1) 3 pound weight gain in 24 hours or 5 pounds in 1 week 2) shortness of breath, with or without a dry hacking cough 3) swelling in the hands, feet or stomach 4) if you have to sleep on extra pillows at night in order to breathe.   Complete by: As directed    Call  MD for:  difficulty breathing, headache or visual disturbances   Complete by: As directed    Call MD for:  persistant dizziness or light-headedness   Complete by: As directed    Diet - low sodium heart healthy   Complete by: As directed    Discharge instructions   Complete by: As directed    Thank you for allowing us to care for you during your admission.  You were admitted after experiencing shortness of breath.  After cardiac work-up, including an ultrasound of your heart, we discovered that your heart failure has worsened recently.  This is likely the cause of your recent shortness of breath.  Cardiology was on board and decided to make some changes to your medication.  You also had an ischemic cardiac work-up on your last day of admission.  At this time, we feel you are safe for discharge.  Please continue to take the Digoxin and Toprol after discharge and follow-up with your cardiologist within 1 week.  To help keep heart failure in check and controlled, limit daily fluid intake.  We discussed some recommendations such as keeping 1 big container of water and having that as the limit of a fluid intake for the day, to make tracking easier.   Increase activity slowly   Complete by: As directed     Signed: Levora DredgeJustin Casimiro Lienhard, MD  IMTS PGY3  07/09/2019, 2:44 PM

## 2019-07-09 NOTE — Care Management (Signed)
Spoke w patient and daughter. They are declining HH at this time. They decline need for DME as well. They are aware that if they change their mind or needs arise in the future that [facility] can be set up through the PCP office.

## 2019-07-15 ENCOUNTER — Other Ambulatory Visit: Payer: Self-pay | Admitting: Cardiology

## 2019-07-19 ENCOUNTER — Other Ambulatory Visit: Payer: Self-pay

## 2019-07-19 ENCOUNTER — Encounter: Payer: Self-pay | Admitting: Cardiology

## 2019-07-19 ENCOUNTER — Ambulatory Visit (INDEPENDENT_AMBULATORY_CARE_PROVIDER_SITE_OTHER): Payer: Medicare Other | Admitting: Cardiology

## 2019-07-19 VITALS — BP 110/60 | HR 41 | Ht 67.0 in | Wt 115.0 lb

## 2019-07-19 DIAGNOSIS — I5022 Chronic systolic (congestive) heart failure: Secondary | ICD-10-CM

## 2019-07-19 DIAGNOSIS — Z950 Presence of cardiac pacemaker: Secondary | ICD-10-CM

## 2019-07-19 DIAGNOSIS — I4821 Permanent atrial fibrillation: Secondary | ICD-10-CM

## 2019-07-19 DIAGNOSIS — I255 Ischemic cardiomyopathy: Secondary | ICD-10-CM | POA: Diagnosis not present

## 2019-07-19 NOTE — Patient Instructions (Addendum)
Medication Instructions:  Your physician recommends that you continue on your current medications as directed. Please refer to the Current Medication list given to you today.  If you need a refill on your cardiac medications before your next appointment, please call your pharmacy.   Lab work: None Ordered  If you have labs (blood work) drawn today and your tests are completely normal, you will receive your results only by: Marland Kitchen MyChart Message (if you have MyChart) OR . A paper copy in the mail If you have any lab test that is abnormal or we need to change your treatment, we will call you to review the results.  Testing/Procedures: None Ordered   Follow up as scheduled.

## 2019-07-19 NOTE — Progress Notes (Signed)
Cardiology Office Note:    Date:  07/19/2019   ID:  Carol Snyder, DOB [birth year], MRN [id number]  PCP:  Kaleen Mask, MD  Cardiologist:  Donato Schultz, MD  Electrophysiologist:  None   Referring MD: Kaleen Mask, *     History of Present Illness:    Carol Snyder is a 85 y.o. female here for follow-up with previous hospitalization with bilateral pleural effusion and acute systolic heart failure.  Thoracentesis took place in August 2019.  Pacemaker in place.  EP recommended starting digoxin for better rate control.  Low-dose.  Her EF on echocardiogram was 25 to 30% with severe akinesis of the entire inferior wall.  Nuclear stress test as below  Increasing doses of Lasix were utilized.    Persistent atrial fibrillation was noted amiodarone had previously been stopped.    During last visit she was feeling quite well.  Still working on the GI system.  She is on Lasix 80 mg once a day.    She ended up having a nuclear stress test on 07/09/2019 which showed infarct and ischemia in the inferior region.  EF was calculated at 33%  Overall she has been doing fairly well since being in the hospital.  Trying to maintain her 40 to 48 ounce fluid restriction.  She is also drinking boost to help her with her weight loss.   Past Medical History:  Diagnosis Date  . Anemia   . Aortic insufficiency   . Chest pain   . Chronic diarrhea   . DNR (do not resuscitate)   . Falling   . History of GI bleed   . History of shingles   . Long term current use of anticoagulant   . Mitral regurgitation   . Orthostatic hypotension   . Persistent atrial fibrillation    on Coumadin  . Pleural effusion   . Sinus node dysfunction (HCC)    a. s/p Biotronik PPM.  . Stroke (HCC)   . Tricuspid regurgitation     Past Surgical History:  Procedure Laterality Date  . APPENDECTOMY    . CHOLECYSTECTOMY     with partial colectomy do to gallbadder infection   . IR THORACENTESIS ASP PLEURAL SPACE  W/IMG GUIDE  06/22/2018  . IR THORACENTESIS ASP PLEURAL SPACE W/IMG GUIDE  09/29/2018  . IR THORACENTESIS ASP PLEURAL SPACE W/IMG GUIDE  07/05/2019  . PACEMAKER IMPLANT  06/04/2016   BTK Edora 8 DR-T implanted by Dr Rudolpho Sevin at [facility] for sick sinus syndrome    Current Medications: Current Meds  Medication Sig  . acetaminophen (TYLENOL) 325 MG tablet Take 650 mg by mouth every 4 (four) hours as needed for mild pain or fever (or if temp is 99.0 or greater for 72 hours.).  Marland Kitchen alendronate (FOSAMAX) 70 MG tablet Take 70 mg by mouth once a week. Sunday  . cholestyramine (QUESTRAN) 4 g packet Take 4 g by mouth 3 (three) times daily with meals.  . digoxin 62.5 MCG TABS Take 0.0625 mg by mouth daily.  . diphenoxylate-atropine (LOMOTIL) 2.5-0.025 MG tablet Take 1 tablet by mouth 4 (four) times daily as needed for diarrhea or loose stools.  . furosemide (LASIX) 80 MG tablet Take 80 mg by mouth daily.   . hydroxypropyl methylcellulose / hypromellose (ISOPTO TEARS / GONIOVISC) 2.5 % ophthalmic solution Place 1 drop into both eyes 3 (three) times daily as needed for dry eyes.  Marland Kitchen KLOR-CON M20 20 MEQ tablet TAKE 1 TABLET BY MOUTH  EVERY DAY  . loperamide (IMODIUM) 2 MG capsule Take 4 mg by mouth daily.   . Menthol, Topical Analgesic, (BIOFREEZE EX) Apply 1 application topically daily as needed (pain).  . metoprolol succinate (TOPROL-XL) 100 MG 24 hr tablet Take 1 tablet (100 mg total) by mouth daily. Take with or immediately following a meal.  . warfarin (COUMADIN) 5 MG tablet Take 2.5-5 mg by mouth See admin instructions. Take 2.5 mg  All the other days at bedtime Take 5 mg on Sunday     Allergies:   Codeine, Enbrel [etanercept], Fosamax [alendronate sodium], Hydromorphone, Motrin [ibuprofen], Sulfa antibiotics, Voltaren [diclofenac], and Naproxen sodium   Social History   Socioeconomic History  . Marital status: Single    Spouse name: Not on file  . Number of children: Not on file  . Years  of education: Not on file  . Highest education level: Not on file  Occupational History  . Not on file  Social Needs  . Financial resource strain: Not on file  . Food insecurity    Worry: Not on file    Inability: Not on file  . Transportation needs    Medical: Not on file    Non-medical: Not on file  Tobacco Use  . Smoking status: Former Smoker    Quit date: 11/17/2004    Years since quitting: 14.6  . Smokeless tobacco: Never Used  Substance and Sexual Activity  . Alcohol use: No  . Drug use: No  . Sexual activity: Not on file  Lifestyle  . Physical activity    Days per week: Not on file    Minutes per session: Not on file  . Stress: Not on file  Relationships  . Social Herbalist on phone: Not on file    Gets together: Not on file    Attends religious service: Not on file    Active member of club or organization: Not on file    Attends meetings of clubs or organizations: Not on file    Relationship status: Not on file  Other Topics Concern  . Not on file  Social History Narrative  . Not on file     Family History: The patient's family history includes Arrhythmia in her daughter; Cancer in her brother; Heart attack in her father; Hypertension in her father; Stroke in her mother.  ROS:   Please see the history of present illness.    All other systems reviewed and are negative.  EKGs/Labs/Other Studies Reviewed:    The following studies were reviewed today:  07/09/19: NUC stress  There was no ST segment deviation noted during stress.  T wave inversion was noted during stress in the II, III and aVF leads.  Defect 1: There is a large defect of moderate severity present in the basal inferoseptal, basal inferior, basal inferolateral, mid inferoseptal, mid inferior and mid inferolateral location.  Findings consistent with subendocardial MI with peri-infarct ischemia of the inferior . inferioseptal and inferolateral walls.  Nuclear stress EF: 33%. The left  ventricular ejection fraction is moderately decreased (30-44%).  This is a high risk study.      EKG: Ventricular pacing  Recent Labs: 09/28/2018: TSH 1.826 05/07/2019: ALT 12 07/05/2019: B Natriuretic Peptide 1,259.5; Hemoglobin 10.7; Platelets 169 07/09/2019: BUN 22; Creatinine, Ser 0.88; Magnesium 2.2; Potassium 4.3; Sodium 136  Recent Lipid Panel No results found for: CHOL, TRIG, HDL, CHOLHDL, VLDL, LDLCALC, LDLDIRECT  Physical Exam:    VS:  BP 110/60  Pulse (!) 41   Ht 5\' 7"  (1.702 m)   Wt 115 lb (52.2 kg)   SpO2 92%   BMI 18.01 kg/m     Wt Readings from Last 3 Encounters:  07/19/19 115 lb (52.2 kg)  07/09/19 100 lb 4.8 oz (45.5 kg)  05/07/19 110 lb (49.9 kg)     GEN: Thin in no acute distress HEENT: Normal NECK: No JVD; No carotid bruits LYMPHATICS: No lymphadenopathy CARDIAC: RRR, no murmurs, rubs, gallops RESPIRATORY:  Clear to auscultation without rales, wheezing or rhonchi, I do not hear any blunting at the bases. ABDOMEN: Soft, non-tender, non-distended MUSCULOSKELETAL:  No edema; No deformity  SKIN: Warm and dry NEUROLOGIC:  Alert and oriented x 3 PSYCHIATRIC:  Normal affect   ASSESSMENT:    1. Permanent atrial fibrillation   2. Chronic systolic heart failure (HCC)   3. Ischemic cardiomyopathy   4. Cardiac pacemaker in situ    PLAN:    In order of problems listed above:    Recurrent pleural effusions -Seems to be doing quite well with her Lasix 80 mg once a day.  Checking basic metabolic profile today.  Had 2 prior thoracenteses.  Trying to avoid further intervention.  Pacemaker -Functioning well, Biotronik.  Recent visit from Dr. Johney FrameAllred reviewed.  EP saw her while she was in the hospital  Atrial fibrillation permanent - Pacemaker for bradycardia control.  Metoprolol and digoxin for rate control.  She is off of Cardizem  Chronic anticoagulation -Coumadin.  Dr. Jeannetta NapElkins has been monitoring.  2.3 last.  Chronic systolic heart failure -  Continue to maintain with Lasix.  Salt restriction.  Fluid restriction.  Good overall weight currently.  EF 25 to 30% on echocardiogram and this correlates with nuclear stress test.  There does appear to be an ischemic component to this in the inferior wall where she likely had an old infarct.  I think given her age and comorbidities as well as large infarct that we continue to manage her medically.  If she were having worsening chest discomfort or anginal symptoms, we could consider cardiac catheterization however this would have to be thoroughly vetted.  My interventional partners also agree, she was seen and discussed with them. -Continue with metoprolol, digoxin.  Her diltiazem was stopped    Medication Adjustments/Labs and Tests Ordered: Current medicines are reviewed at length with the patient today.  Concerns regarding medicines are outlined above.  No orders of the defined types were placed in this encounter.  No orders of the defined types were placed in this encounter.   Patient Instructions  Medication Instructions:  Your physician recommends that you continue on your current medications as directed. Please refer to the Current Medication list given to you today.  If you need a refill on your cardiac medications before your next appointment, please call your pharmacy.   Lab work: None Ordered  If you have labs (blood work) drawn today and your tests are completely normal, you will receive your results only by: Marland Kitchen. MyChart Message (if you have MyChart) OR . A paper copy in the mail If you have any lab test that is abnormal or we need to change your treatment, we will call you to review the results.  Testing/Procedures: None Ordered   Follow up as scheduled.            Signed, Donato SchultzMark , MD  07/19/2019 2:57 PM    [facility]

## 2019-07-20 ENCOUNTER — Ambulatory Visit (INDEPENDENT_AMBULATORY_CARE_PROVIDER_SITE_OTHER): Payer: Medicare Other | Admitting: *Deleted

## 2019-07-20 DIAGNOSIS — R001 Bradycardia, unspecified: Secondary | ICD-10-CM

## 2019-07-22 LAB — CUP PACEART REMOTE DEVICE CHECK
Date Time Interrogation Session: 20200904073301
Implantable Lead Implant Date: 20170719
Implantable Lead Implant Date: 20170719
Implantable Lead Location: 753859
Implantable Lead Location: 753860
Implantable Lead Model: 377
Implantable Lead Model: 377
Implantable Lead Serial Number: 49475245
Implantable Lead Serial Number: 49500032
Implantable Pulse Generator Implant Date: 20170719
Pulse Gen Model: 407145
Pulse Gen Serial Number: 68814684

## 2019-07-24 ENCOUNTER — Other Ambulatory Visit: Payer: Self-pay | Admitting: Internal Medicine

## 2019-07-24 ENCOUNTER — Other Ambulatory Visit: Payer: Self-pay | Admitting: Cardiology

## 2019-07-28 ENCOUNTER — Other Ambulatory Visit: Payer: Self-pay | Admitting: Cardiology

## 2019-07-28 MED ORDER — FUROSEMIDE 80 MG PO TABS: 80.0000 mg | ORAL_TABLET | Freq: Every day | ORAL | 3 refills | Status: DC

## 2019-07-29 ENCOUNTER — Other Ambulatory Visit: Payer: Self-pay | Admitting: Cardiology

## 2019-07-29 MED ORDER — METOPROLOL SUCCINATE ER 100 MG PO TB24: 100.0000 mg | ORAL_TABLET | Freq: Every day | ORAL | 3 refills | Status: DC

## 2019-07-29 MED ORDER — DIGOXIN 62.5 MCG PO TABS: 0.0625 mg | ORAL_TABLET | Freq: Every day | ORAL | 3 refills | Status: DC

## 2019-07-29 NOTE — Telephone Encounter (Signed)
Pt's medications were sent to pt's pharmacy as requested. Confirmation received.  

## 2019-08-03 ENCOUNTER — Other Ambulatory Visit: Payer: Self-pay | Admitting: Internal Medicine

## 2019-08-03 ENCOUNTER — Telehealth: Payer: Self-pay | Admitting: Cardiology

## 2019-08-03 MED ORDER — DIGOXIN 125 MCG PO TABS: 0.0625 mg | ORAL_TABLET | Freq: Every day | ORAL | 3 refills | Status: DC

## 2019-08-03 NOTE — Telephone Encounter (Signed)
SCRIPT FOR DIG 0.125 MG TAKE 1/2 TABLET DAILY SENT INTO PHARMACY.

## 2019-08-03 NOTE — Telephone Encounter (Signed)
Pt's daughter calling stating that pt's pharmacy does not carry the digoxin 62.5 mcg and that pt would need to continue taking the digoxin 0.25 mg taken 1/2 tablet daily. I called the pt's pharmacy as well and they stated that they do not carry digoxin 62.5 mcg. Would Dr. Marlou Porch like to order digoxin 0.25 mg, pt taking 1/2 by mouth daily, so pt can get her medication? Please address

## 2019-08-04 ENCOUNTER — Encounter: Payer: Self-pay | Admitting: Cardiology

## 2019-08-04 NOTE — Progress Notes (Signed)
Remote pacemaker transmission.   

## 2019-09-06 NOTE — Progress Notes (Deleted)
Virtual Visit via Telephone Note   This visit type was conducted due to national recommendations for restrictions regarding the COVID-19 Pandemic (e.g. social distancing) in an effort to limit this patient's exposure and mitigate transmission in our community.  Due to her co-morbid illnesses, this patient is at least at moderate risk for complications without adequate follow up.  This format is felt to be most appropriate for this patient at this time.  The patient did not have access to video technology/had technical difficulties with video requiring transitioning to audio format only (telephone).  All issues noted in this document were discussed and addressed.  No physical exam could be performed with this format.  Please refer to the patient's chart for her  consent to telehealth for [facility].   Date:  09/06/2019   ID:  Carol Snyder, DOB 09-25-1929, MRN [id number]  Patient Location: Home Provider Location: Home  PCP:  Kaleen Mask, MD  Cardiologist:  Donato Schultz, MD  Electrophysiologist:  None   Evaluation Performed:  Follow-Up Visit  Chief Complaint: Follow up, seen for Dr. Anne Fu  History of Present Illness:    Carol Snyder is a 85 y.o. female with a hx of acute on chronic heart failure with an EF of 25-30%, persistent atrial fibrillation on Digoxin and Coumadin with sinus node dysfunction s/p Biotronik PPM followed by Dr. Johney Frame, hx of bilateral pleural effusions and acute systolic heart failure with escalating doses of Lasix  She was hospitalized and seen by cardiology and EP 06/2019 . She was admitted to [facility] for SOB, DOE and symptoms of orthopnea, found to be in acute/chronic CHF. She underwent a thoracentesis (R) and was admitted for IV diuresis. Updated TTE notes showed new reduction in LVEF 25-30%, observed to have unusual pacing behavior on telemetry with V pacing at rates to 120 range. No cath was pursued in the setting of no anginal symptoms. Medical management was  recommended. Lopressor was changed to Toprol. EP was aksed to weigh in on her device programming. Per chart review, Dr. Graciela Husbands suspected pacing at higher rates were a function of CLS pacing. Per EP, CLS was inactivated and VVI was lower to 50.    Also with a hx of recurrent bilateral pleural effusions and acute heart failure symptoms 06/2018 in which she underwent a thoracentesis. At last follow up visit, she was noted to be taking Lasix 80mg  QD. Underwent a nuclear stress test 07/09/2019 which showed infarct and ischemia in the inferior region with an EF of 33%.   Overall she was doing well since her hospitalization at follow up with Dr. 07/11/2019 07/2019. Echo abnormalities thought to be old given no acute symptoms. Medical management at that time.  Needs digoxin level?   Today she is seen   1. Recurrent pleural effusions: -Breathing ok -Maintained on Lasix 80mg  QD -BMET    2. Persistent atrial fibrillation: -Device interrogation showed underlying rate is AF, and despite mode VVI 60 was having frequent RV pacing due to CLS mode -EP consulted>>CLS was inactivated and VVI was lower to 50.  -PPM in place for bradycardia control -On Toprol 100 and digoxin 0.0625mg  for rate control (0.125>>1/2 tab)   3. PPM: -Follows with Dr. 08/2019 -Last remote download>>functioning normally>>Biotronik -Seen by EP during last hospitalization  4. Chronic anticoagulation: -On Coumadin for AC -INR monitored by PCP -Last, 2.4 on    5. Chronic systolic heart failure: -Maintained on Lasix 80mg  QD -Last EF 25-30%>>correlates with stress test which showed  ischemia. Dr. Marlou Porch reviewed and commented that likely this correlates with an old MI. Given her age and co-morbid conditions, plan was to treat medically -If worsening chest pain or other anginal symptoms, would consider cath however would be judicious with that decision per Dr. Marlou Porch  -Continue metoprolol, digoxin    The patient does not have symptoms  concerning for COVID-19 infection (fever, chills, cough, or new shortness of breath).    Past Medical History:  Diagnosis Date  . Anemia   . Aortic insufficiency   . Chest pain   . Chronic diarrhea   . DNR (do not resuscitate)   . Falling   . History of GI bleed   . History of shingles   . Long term current use of anticoagulant   . Mitral regurgitation   . Orthostatic hypotension   . Persistent atrial fibrillation    on Coumadin  . Pleural effusion   . Sinus node dysfunction (HCC)    a. s/p Biotronik PPM.  . Stroke ([place])   . Tricuspid regurgitation    Past Surgical History:  Procedure Laterality Date  . APPENDECTOMY    . CHOLECYSTECTOMY     with partial colectomy do to gallbadder infection   . IR THORACENTESIS ASP PLEURAL SPACE W/IMG GUIDE  06/22/2018  . IR THORACENTESIS ASP PLEURAL SPACE W/IMG GUIDE  09/29/2018  . IR THORACENTESIS ASP PLEURAL SPACE W/IMG GUIDE  07/05/2019  . PACEMAKER IMPLANT  06/04/2016   BTK Edora 8 DR-T implanted by Dr Minna Merritts at [facility] for sick sinus syndrome     No outpatient medications have been marked as taking for the 09/12/19 encounter (Appointment) with Tommie Raymond, NP.     Allergies:   Codeine, Enbrel [etanercept], Fosamax [alendronate sodium], Hydromorphone, Motrin [ibuprofen], Sulfa antibiotics, Voltaren [diclofenac], and Naproxen sodium   Social History   Tobacco Use  . Smoking status: Former Smoker    Quit date: 11/17/2004    Years since quitting: 14.8  . Smokeless tobacco: Never Used  Substance Use Topics  . Alcohol use: No  . Drug use: No     Family Hx: The patient's family history includes Arrhythmia in her daughter; Cancer in her brother; Heart attack in her father; Hypertension in her father; Stroke in her mother.  ROS:   Please see the history of present illness.     All other systems reviewed and are negative.   Prior CV studies:   The following studies were reviewed today:  07/09/19: NUC  stress  There was no ST segment deviation noted during stress.  T wave inversion was noted during stress in the II, III and aVF leads.  Defect 1: There is a large defect of moderate severity present in the basal inferoseptal, basal inferior, basal inferolateral, mid inferoseptal, mid inferior and mid inferolateral location.  Findings consistent with subendocardial MI with peri-infarct ischemia of the inferior . inferioseptal and inferolateral walls.  Nuclear stress EF: 33%. The left ventricular ejection fraction is moderately decreased (30-44%).  This is a high risk study.     Remote Device Check 01/2019 Notes recorded by Thompson Grayer, MD on 01/23/2019 at 5:32 PM EDT Remote device check reviewed. Normal device function. Battery status, leads stable. Histograms reviewed and appropriate.  Routine follow-up  Echo Study Conclusions 09/2018  - Left ventricle: The cavity size was normal. Wall thickness was normal. Systolic function was normal. The estimated ejection fraction was in the range of 50% to 55%. Wall motion was normal; there were  no regional wall motion abnormalities. The study is not technically sufficient to allow evaluation of LV diastolic function. - Ventricular septum: Septal motion showed abnormal function and dyssynergy. - Aortic valve: Valve mobility was restricted. There was mild regurgitation. - Mitral valve: Calcified annulus. There was mild regurgitation. - Left atrium: The atrium was moderately dilated. - Right ventricle: The cavity size was moderately dilated. - Right atrium: The atrium was severely dilated. - Tricuspid valve: There was moderate regurgitation. - Pulmonary arteries: Systolic pressure was mildly increased. PA peak pressure: 47 mm Hg (S).  Impressions:  - Low normal LV systolic function; calcified aortic valve with mild AI; mild MR; biatrial enlargement; moderate RVE; moderate TR with mild pulmonary  hypertension.  Labs/Other Tests and Data Reviewed:    EKG:  No ECG reviewed.  Recent Labs: 09/28/2018: TSH 1.826 05/07/2019: ALT 12 07/05/2019: B Natriuretic Peptide 1,259.5; Hemoglobin 10.7; Platelets 169 07/09/2019: BUN 22; Creatinine, Ser 0.88; Magnesium 2.2; Potassium 4.3; Sodium 136   Recent Lipid Panel No results found for: CHOL, TRIG, HDL, CHOLHDL, LDLCALC, LDLDIRECT  Wt Readings from Last 3 Encounters:  07/19/19 115 lb (52.2 kg)  07/09/19 100 lb 4.8 oz (45.5 kg)  05/07/19 110 lb (49.9 kg)     Objective:    Vital Signs:  There were no vitals taken for this visit.   VITAL SIGNS:  reviewed GEN:  no acute distress RESPIRATORY:  normal respiratory effort, symmetric expansion NEURO:  alert and oriented x 3, no obvious focal deficit PSYCH:  normal affect  ASSESSMENT & PLAN:    1. ***  COVID-19 Education: The signs and symptoms of COVID-19 were discussed with the patient and how to seek care for testing (follow up with PCP or arrange E-visit).  ***The importance of social distancing was discussed today.  Time:   Today, I have spent *** minutes with the patient with telehealth technology discussing the above problems.     Medication Adjustments/Labs and Tests Ordered: Current medicines are reviewed at length with the patient today.  Concerns regarding medicines are outlined above.   Tests Ordered: No orders of the defined types were placed in this encounter.   Medication Changes: No orders of the defined types were placed in this encounter.   Follow Up:  {F/U Format:[phone]} {follow up:15908}  Signed, Georgie ChardJill , NP  09/06/2019 10:12 AM     [facility]

## 2019-09-12 ENCOUNTER — Encounter: Payer: Self-pay | Admitting: Cardiology

## 2019-09-12 ENCOUNTER — Telehealth (INDEPENDENT_AMBULATORY_CARE_PROVIDER_SITE_OTHER): Payer: Medicare Other | Admitting: Cardiology

## 2019-09-12 ENCOUNTER — Other Ambulatory Visit: Payer: Self-pay

## 2019-09-12 VITALS — BP 111/62 | HR 86 | Ht 67.0 in | Wt 107.0 lb

## 2019-09-12 DIAGNOSIS — J90 Pleural effusion, not elsewhere classified: Secondary | ICD-10-CM | POA: Diagnosis not present

## 2019-09-12 DIAGNOSIS — R079 Chest pain, unspecified: Secondary | ICD-10-CM

## 2019-09-12 DIAGNOSIS — Z950 Presence of cardiac pacemaker: Secondary | ICD-10-CM | POA: Diagnosis not present

## 2019-09-12 DIAGNOSIS — I255 Ischemic cardiomyopathy: Secondary | ICD-10-CM

## 2019-09-12 DIAGNOSIS — I4821 Permanent atrial fibrillation: Secondary | ICD-10-CM | POA: Diagnosis not present

## 2019-09-12 MED ORDER — NITROGLYCERIN 0.4 MG SL SUBL: 0.4000 mg | SUBLINGUAL_TABLET | SUBLINGUAL | 6 refills | Status: DC | PRN

## 2019-09-12 NOTE — Progress Notes (Signed)
Virtual Visit via Telephone Note   This visit type was conducted due to national recommendations for restrictions regarding the COVID-19 Pandemic (e.g. social distancing) in an effort to limit this patient's exposure and mitigate transmission in our community.  Due to her co-morbid illnesses, this patient is at least at moderate risk for complications without adequate follow up.  This format is felt to be most appropriate for this patient at this time.  The patient did not have access to video technology/had technical difficulties with video requiring transitioning to audio format only (telephone).  All issues noted in this document were discussed and addressed.  No physical exam could be performed with this format.  Please refer to the patient's chart for her  consent to telehealth for [facility].   Date:  09/12/2019   ID:  Carol Snyder, DOB 1929/05/26, MRN [id number]  Patient Location: Home Provider Location: Home  PCP:  Kaleen Mask, MD  Cardiologist:  Donato Schultz, MD  Electrophysiologist:  None   Evaluation Performed:  Follow-Up Visit  Chief Complaint: Follow up, seen for Dr. Anne Fu  History of Present Illness:    Carol Snyder is a 85 y.o. female with a hx of acute on chronic heart failure with an EF of 25-30%, persistent atrial fibrillation on Digoxin and Coumadin with sinus node dysfunction s/p Biotronik PPM followed by Dr. Johney Frame, hx of bilateral pleural effusions and acute systolic heart failure with escalating doses of Lasix  She was hospitalized and seen by cardiology and EP 06/2019 . She was admitted to [facility] for SOB, DOE and symptoms of orthopnea, found to be in acute/chronic CHF. She underwent a thoracentesis (R) and was admitted for IV diuresis. Updated TTE notes showed new reduction in LVEF 25-30%, observed to have unusual pacing behavior on telemetry with V pacing at rates to 120 range. No cath was pursued in the setting of no anginal symptoms. Medical management was  recommended. Lopressor was changed to Toprol. EP was aksed to weigh in on her device programming. Per chart review, Dr. Graciela Husbands suspected pacing at higher rates were a function of CLS pacing. Per EP, CLS was inactivated and VVI was lower to 50.   Also with a hx of recurrent bilateral pleural effusions and acute heart failure symptoms 06/2018 in which she underwent a thoracentesis. At last follow up visit, she was noted to be taking Lasix  QD. Underwent a nuclear stress test 07/09/2019 which showed infarct and ischemia in the inferior region with an EF of 33%.   Overall she was doing well since her hospitalization at follow up with Dr. Anne Fu 07/2019. Echo abnormalities thought to be old given no acute symptoms. Medical management at that time.  Today she is seen with her daughter at her side to help with history. She is asking about zinc and vitamin D3 supplementation to prevent Covid.  Daughter states that she will sometimes get a dull headache and have some mild "chest discomfort" that are both relieved with the administration of Tylenol.  Also states that rubbing her chest alleviates the pain.  Patient states that she has been cross stitching a lot and thinks that her positioning may be causing this. There are no exertional qualities or associated symptoms.  She did have an abnormal stress test 06/2019.  We discussed adding as needed sublingual nitroglycerin if ever this is not relieved with Tylenol and will reassess in 3 to 4 weeks.  As previously stated, abnormal stress test thought to be old,  nonacute.  The patient does not have symptoms concerning for COVID-19 infection (fever, chills, cough, or new shortness of breath).   Past Medical History:  Diagnosis Date  . Anemia   . Aortic insufficiency   . Chest pain   . Chronic diarrhea   . DNR (do not resuscitate)   . Falling   . History of GI bleed   . History of shingles   . Long term current use of anticoagulant   . Mitral regurgitation    . Orthostatic hypotension   . Persistent atrial fibrillation (HCC)    on Coumadin  . Pleural effusion   . Sinus node dysfunction (HCC)    a. s/p Biotronik PPM.  . Stroke (HCC)   . Tricuspid regurgitation    Past Surgical History:  Procedure Laterality Date  . APPENDECTOMY    . CHOLECYSTECTOMY     with partial colectomy do to gallbadder infection   . IR THORACENTESIS ASP PLEURAL SPACE W/IMG GUIDE  06/22/2018  . IR THORACENTESIS ASP PLEURAL SPACE W/IMG GUIDE  09/29/2018  . IR THORACENTESIS ASP PLEURAL SPACE W/IMG GUIDE  07/05/2019  . PACEMAKER IMPLANT  06/04/2016   BTK Edora 8 DR-T implanted by Dr Rudolpho SevinAkbary at [facility] for sick sinus syndrome     Current Meds  Medication Sig  . acetaminophen (TYLENOL) 325 MG tablet Take 650 mg by mouth every 4 (four) hours as needed for mild pain or fever (or if temp is 99.0 or greater for 72 hours.).  Marland Kitchen. alendronate (FOSAMAX) 70 MG tablet Take 70 mg by mouth once a week. Sunday  . cholestyramine (QUESTRAN) 4 g packet Take 2 g by mouth daily. Half pack daily  . digoxin (LANOXIN) 0.125 MG tablet Take 0.5 tablets (0.0625 mg total) by mouth daily.  . furosemide (LASIX) 80 MG tablet Take 1 tablet (80 mg total) by mouth daily.  . hydroxypropyl methylcellulose / hypromellose (ISOPTO TEARS / GONIOVISC) 2.5 % ophthalmic solution Place 1 drop into both eyes 3 (three) times daily as needed for dry eyes.  Marland Kitchen. KLOR-CON M20 20 MEQ tablet TAKE 1 TABLET BY MOUTH EVERY DAY  . loperamide (IMODIUM) 2 MG capsule Take 4 mg by mouth daily.   Marland Kitchen. loratadine (CLARITIN) 10 MG tablet Take 10 mg by mouth daily.  . Menthol, Topical Analgesic, (BIOFREEZE EX) Apply 1 application topically daily as needed (pain).  . metoprolol succinate (TOPROL-XL) 100 MG 24 hr tablet Take 1 tablet (100 mg total) by mouth daily. Take with or immediately following a meal.  . warfarin (COUMADIN) 5 MG tablet Take 2.5-5 mg by mouth See admin instructions. Take 2.5 mg  All the other days at  bedtime Take 5 mg on Sunday     Allergies:   Codeine, Enbrel [etanercept], Fosamax [alendronate sodium], Hydromorphone, Motrin [ibuprofen], Sulfa antibiotics, Voltaren [diclofenac], and Naproxen sodium   Social History   Tobacco Use  . Smoking status: Former Smoker    Quit date: 11/17/2004    Years since quitting: 14.8  . Smokeless tobacco: Never Used  Substance Use Topics  . Alcohol use: No  . Drug use: No     Family Hx: The patient's family history includes Arrhythmia in her daughter; Cancer in her brother; Heart attack in her father; Hypertension in her father; Stroke in her mother.  ROS:   Please see the history of present illness.     All other systems reviewed and are negative.  Prior CV studies:   The following studies were reviewed today:  07/09/19: NUC stress  There was no ST segment deviation noted during stress.  T wave inversion was noted during stress in the II, III and aVF leads.  Defect 1: There is a large defect of moderate severity present in the basal inferoseptal, basal inferior, basal inferolateral, mid inferoseptal, mid inferior and mid inferolateral location.  Findings consistent with subendocardial MI with peri-infarct ischemia of the inferior . inferioseptal and inferolateral walls.  Nuclear stress EF: 33%. The left ventricular ejection fraction is moderately decreased (30-44%).  This is a high risk study.    Remote Device Check 01/2019 Notes recorded by Hillis Range, MD on 01/23/2019 at 5:32 PM EDT Remote device check reviewed. Normal device function. Battery status, leads stable. Histograms reviewed and appropriate.  Routine follow-up  Echo Study Conclusions 09/2018  - Left ventricle: The cavity size was normal. Wall thickness was normal. Systolic function was normal. The estimated ejection fraction was in the range of 50% to 55%. Wall motion was normal; there were no regional wall motion abnormalities. The study is not  technically sufficient to allow evaluation of LV diastolic function. - Ventricular septum: Septal motion showed abnormal function and dyssynergy. - Aortic valve: Valve mobility was restricted. There was mild regurgitation. - Mitral valve: Calcified annulus. There was mild regurgitation. - Left atrium: The atrium was moderately dilated. - Right ventricle: The cavity size was moderately dilated. - Right atrium: The atrium was severely dilated. - Tricuspid valve: There was moderate regurgitation. - Pulmonary arteries: Systolic pressure was mildly increased. PA peak pressure: 47 mm Hg (S).  Impressions:  - Low normal LV systolic function; calcified aortic valve with mild AI; mild MR; biatrial enlargement; moderate RVE; moderate TR with mild pulmonary hypertension.  Labs/Other Tests and Data Reviewed:    EKG:  No ECG reviewed.  Recent Labs: 09/28/2018: TSH 1.826 05/07/2019: ALT 12 07/05/2019: B Natriuretic Peptide 1,259.5; Hemoglobin 10.7; Platelets 169 07/09/2019: BUN 22; Creatinine, Ser 0.88; Magnesium 2.2; Potassium 4.3; Sodium 136   Recent Lipid Panel No results found for: CHOL, TRIG, HDL, CHOLHDL, LDLCALC, LDLDIRECT  Wt Readings from Last 3 Encounters:  09/12/19 107 lb (48.5 kg)  07/19/19 115 lb (52.2 kg)  07/09/19 100 lb 4.8 oz (45.5 kg)     Objective:    Vital Signs:  BP 111/62   Pulse 86   Ht 5\' 7"  (1.702 m)   Wt 107 lb (48.5 kg)   BMI 16.76 kg/m    VITAL SIGNS:  reviewed RESPIRATORY:  normal respiratory effort, symmetric expansion NEURO:  alert and oriented x 3, no obvious focal deficit PSYCH:  normal affect  ASSESSMENT & PLAN:    1. Recurrent pleural effusions: -No shortness of breath, recurrence -Maintained on Lasix 80mg  QD  2. Persistent atrial fibrillation: -Device interrogation showed underlying rate is AF, and despite mode VVI 60 was having frequent RV pacing due to CLS mode -EP consulted>>CLS was inactivated and VVI was lower to 50.   -PPM in place for bradycardia control -On Toprol 100 and digoxin 0.0625mg  for rate control (0.125>>1/2 tab) -Needs digoxin level at next office visit>> no signs of dig toxicity  3. PPM: -Follows with Dr. -Last remote download>>functioning normally>>Biotronik -Seen by EP during last hospitalization  4. Chronic anticoagulation: -On Coumadin for AC -INR monitored by PCP -Last, 2.4   5. Chronic systolic heart failure: -Maintained on Lasix 80mg  QD -Last EF 25-30%>>correlates with stress test which showed ischemia. Dr. reviewed and commented that likely this correlates with an old MI. Given her  age and co-morbid conditions, plan was to treat medically -If worsening chest pain or other anginal symptoms, would consider cath however would be judicious with that decision per Dr. Marlou Porch -We will send note for Dr. Marlou Porch review given questionable anginal complaints -Add as needed SL NTG to assess for chest pain relief>>> low suspicion for angina, likely musculoskeletal  -Continue metoprolol, digoxin    COVID-19 Education: The signs and symptoms of COVID-19 were discussed with the patient and how to seek care for testing (follow up with PCP or arrange E-visit).  The importance of social distancing was discussed today.  Time:   Today, I have spent 20 minutes with the patient with telehealth technology discussing the above problems.    Medication Adjustments/Labs and Tests Ordered: Current medicines are reviewed at length with the patient today.  Concerns regarding medicines are outlined above.   Tests Ordered: No orders of the defined types were placed in this encounter.   Medication Changes: Meds ordered this encounter  Medications  . nitroGLYCERIN (NITROSTAT) 0.4 MG SL tablet    Sig: Place 1 tablet (0.4 mg total) under the tongue every 5 (five) minutes as needed for chest pain.    Dispense:  25 tablet    Refill:  6    Follow Up:  Virtual Visit  Myself or Dr. Marlou Porch in  3 to 4 weeks  Signed, Kathyrn Drown, NP  09/12/2019 5:49 PM    [place]

## 2019-09-12 NOTE — Patient Instructions (Addendum)
Medication Instructions:  Your physician recommends that you continue on your current medications as directed. Please refer to the Current Medication list given to you today. A prescription for Nitroglycerin to use as needed has been sent to [facility]  *If you need a refill on your cardiac medications before your next appointment, please call your pharmacy*  Lab Work: none If you have labs (blood work) drawn today and your tests are completely normal, you will receive your results only by: Marland Kitchen MyChart Message (if you have MyChart) OR . A paper copy in the mail If you have any lab test that is abnormal or we need to change your treatment, we will call you to review the results.  Testing/Procedures: none  Follow-Up: At [facility], you and your health needs are our priority.  As part of our continuing mission to provide you with exceptional heart care, we have created designated Provider Care Teams.  These Care Teams include your primary Cardiologist (physician) and Advanced Practice Providers (APPs -  Physician Assistants and Nurse Practitioners) who all work together to provide you with the care you need, when you need it.  Your next appointment:  About 3-4 weeks.  Please call our office when you would like to schedule this appointment.    The format for your next appointment:   virtual  Provider:   Georgie Chard, NP  Other Instructions  Nitroglycerin sublingual tablets What is this medicine? NITROGLYCERIN (nye troe GLI ser in) is a type of vasodilator. It relaxes blood vessels, increasing the blood and oxygen supply to your heart. This medicine is used to relieve chest pain caused by angina. It is also used to prevent chest pain before activities like climbing stairs, going outdoors in cold weather, or sexual activity. This medicine may be used for other purposes; ask your health care provider or pharmacist if you have questions. COMMON BRAND NAME(S): Nitroquick, Nitrostat,  Nitrotab What should I tell my health care provider before I take this medicine? They need to know if you have any of these conditions:  anemia  head injury, recent stroke, or bleeding in the brain  liver disease  previous heart attack  an unusual or allergic reaction to nitroglycerin, other medicines, foods, dyes, or preservatives  pregnant or trying to get pregnant  breast-feeding How should I use this medicine? Take this medicine by mouth as needed. At the first sign of an angina attack (chest pain or tightness) place one tablet under your tongue. You can also take this medicine 5 to 10 minutes before an event likely to produce chest pain. Follow the directions on the prescription label. Let the tablet dissolve under the tongue. Do not swallow whole. Replace the dose if you accidentally swallow it. It will help if your mouth is not dry. Saliva around the tablet will help it to dissolve more quickly. Do not eat or drink, smoke or chew tobacco while a tablet is dissolving. If you are not better within 5 minutes after taking ONE dose of nitroglycerin, call 9-1-1 immediately to seek emergency medical care. Do not take more than 3 nitroglycerin tablets over 15 minutes. If you take this medicine often to relieve symptoms of angina, your doctor or health care professional may provide you with different instructions to manage your symptoms. If symptoms do not go away after following these instructions, it is important to call 9-1-1 immediately. Do not take more than 3 nitroglycerin tablets over 15 minutes. Talk to your pediatrician regarding the use of this  medicine in children. Special care may be needed. Overdosage: If you think you have taken too much of this medicine contact a poison control center or emergency room at once. NOTE: This medicine is only for you. Do not share this medicine with others. What if I miss a dose? This does not apply. This medicine is only used as needed. What may  interact with this medicine? Do not take this medicine with any of the following medications:  certain migraine medicines like ergotamine and dihydroergotamine (DHE)  medicines used to treat erectile dysfunction like sildenafil, tadalafil, and vardenafil  riociguat This medicine may also interact with the following medications:  alteplase  aspirin  heparin  medicines for high blood pressure  medicines for mental depression  other medicines used to treat angina  phenothiazines like chlorpromazine, mesoridazine, prochlorperazine, thioridazine This list may not describe all possible interactions. Give your health care provider a list of all the medicines, herbs, non-prescription drugs, or dietary supplements you use. Also tell them if you smoke, drink alcohol, or use illegal drugs. Some items may interact with your medicine. What should I watch for while using this medicine? Tell your doctor or health care professional if you feel your medicine is no longer working. Keep this medicine with you at all times. Sit or lie down when you take your medicine to prevent falling if you feel dizzy or faint after using it. Try to remain calm. This will help you to feel better faster. If you feel dizzy, take several deep breaths and lie down with your feet propped up, or bend forward with your head resting between your knees. You may get drowsy or dizzy. Do not drive, use machinery, or do anything that needs mental alertness until you know how this drug affects you. Do not stand or sit up quickly, especially if you are an older patient. This reduces the risk of dizzy or fainting spells. Alcohol can make you more drowsy and dizzy. Avoid alcoholic drinks. Do not treat yourself for coughs, colds, or pain while you are taking this medicine without asking your doctor or health care professional for advice. Some ingredients may increase your blood pressure. What side effects may I notice from receiving this  medicine? Side effects that you should report to your doctor or health care professional as soon as possible:  blurred vision  dry mouth  skin rash  sweating  the feeling of extreme pressure in the head  unusually weak or tired Side effects that usually do not require medical attention (report to your doctor or health care professional if they continue or are bothersome):  flushing of the face or neck  headache  irregular heartbeat, palpitations  nausea, vomiting This list may not describe all possible side effects. Call your doctor for medical advice about side effects. You may report side effects to FDA at 1-800-FDA-1088. Where should I keep my medicine? Keep out of the reach of children. Store at room temperature between 20 and 25 degrees C (68 and 77 degrees F). Store in Chief of Staff. Protect from light and moisture. Keep tightly closed. Throw away any unused medicine after the expiration date. NOTE: This sheet is a summary. It may not cover all possible information. If you have questions about this medicine, talk to your doctor, pharmacist, or health care provider.  2020 Elsevier/Gold Standard (2013-09-01 17:57:36)

## 2019-09-14 ENCOUNTER — Telehealth: Payer: Medicare Other | Admitting: Cardiology

## 2019-10-20 ENCOUNTER — Ambulatory Visit (INDEPENDENT_AMBULATORY_CARE_PROVIDER_SITE_OTHER): Payer: Medicare Other | Admitting: *Deleted

## 2019-10-20 DIAGNOSIS — Z950 Presence of cardiac pacemaker: Secondary | ICD-10-CM

## 2019-10-20 LAB — CUP PACEART REMOTE DEVICE CHECK
Battery Remaining Percentage: 75 %
Date Time Interrogation Session: 20201203073920
Implantable Lead Implant Date: 20170719
Implantable Lead Implant Date: 20170719
Implantable Lead Location: 753859
Implantable Lead Location: 753860
Implantable Lead Model: 377
Implantable Lead Model: 377
Implantable Lead Serial Number: 49475245
Implantable Lead Serial Number: 49500032
Implantable Pulse Generator Implant Date: 20170719
Lead Channel Impedance Value: 488 Ohm
Lead Channel Pacing Threshold Amplitude: 0.7 V
Lead Channel Pacing Threshold Pulse Width: 0.4 ms
Lead Channel Sensing Intrinsic Amplitude: 10.9 mV
Lead Channel Setting Pacing Amplitude: 2.4 V
Lead Channel Setting Pacing Pulse Width: 0.4 ms
Pulse Gen Model: 407145
Pulse Gen Serial Number: 68814684

## 2019-11-15 NOTE — Progress Notes (Signed)
PPM remote 

## 2019-11-28 ENCOUNTER — Telehealth: Payer: Self-pay | Admitting: Cardiology

## 2019-11-28 NOTE — Telephone Encounter (Signed)
Spoke with Ms. Carlena Sax, the patients daughter advising her to contact the pts PCP in regards to getting clearance.

## 2019-11-28 NOTE — Telephone Encounter (Signed)
New Message   We are recommending the COVID-19 vaccine to all of our patients. Cardiac medications (including blood thinners) should not deter anyone from being vaccinated and there is no need to hold any of those medications prior to vaccine administration.     Currently, there is a hotline to call (active 11/25/19) to schedule vaccination appointments as no walk-ins will be accepted.   Number: [phone]    If you have further questions or concerns about the vaccine process, please visit [email or web address] or contact your primary care physician.      Pts daughter is calling and says the health department is wanting a Medical note stating that the pt is cleared to get the COVID vaccine   Please advise

## 2019-12-16 ENCOUNTER — Ambulatory Visit: Payer: Medicare Other

## 2020-01-02 ENCOUNTER — Ambulatory Visit: Payer: Medicare Other

## 2020-01-19 ENCOUNTER — Ambulatory Visit (INDEPENDENT_AMBULATORY_CARE_PROVIDER_SITE_OTHER): Payer: Medicare PPO | Admitting: *Deleted

## 2020-01-19 DIAGNOSIS — Z950 Presence of cardiac pacemaker: Secondary | ICD-10-CM | POA: Diagnosis not present

## 2020-01-19 LAB — CUP PACEART REMOTE DEVICE CHECK
Date Time Interrogation Session: 20210303164458
Implantable Lead Implant Date: 20170719
Implantable Lead Implant Date: 20170719
Implantable Lead Location: 753859
Implantable Lead Location: 753860
Implantable Lead Model: 377
Implantable Lead Model: 377
Implantable Lead Serial Number: 49475245
Implantable Lead Serial Number: 49500032
Implantable Pulse Generator Implant Date: 20170719
Pulse Gen Model: 407145
Pulse Gen Serial Number: 68814684

## 2020-01-20 NOTE — Progress Notes (Signed)
PPM Remote  

## 2020-04-10 ENCOUNTER — Other Ambulatory Visit: Payer: Self-pay | Admitting: Cardiology

## 2020-04-19 ENCOUNTER — Ambulatory Visit (INDEPENDENT_AMBULATORY_CARE_PROVIDER_SITE_OTHER): Payer: Medicare PPO | Admitting: *Deleted

## 2020-04-19 DIAGNOSIS — I482 Chronic atrial fibrillation, unspecified: Secondary | ICD-10-CM | POA: Diagnosis not present

## 2020-04-19 LAB — CUP PACEART REMOTE DEVICE CHECK
Battery Remaining Percentage: 70 %
Brady Statistic RV Percent Paced: 2 %
Date Time Interrogation Session: 20210603124216
Implantable Lead Implant Date: 20170719
Implantable Lead Implant Date: 20170719
Implantable Lead Location: 753859
Implantable Lead Location: 753860
Implantable Lead Model: 377
Implantable Lead Model: 377
Implantable Lead Serial Number: 49475245
Implantable Lead Serial Number: 49500032
Implantable Pulse Generator Implant Date: 20170719
Lead Channel Impedance Value: 488 Ohm
Lead Channel Pacing Threshold Amplitude: 0.8 V
Lead Channel Pacing Threshold Pulse Width: 0.4 ms
Lead Channel Sensing Intrinsic Amplitude: 9.9 mV
Lead Channel Setting Pacing Amplitude: 2.4 V
Lead Channel Setting Pacing Pulse Width: 0.4 ms
Pulse Gen Model: 407145
Pulse Gen Serial Number: 68814684

## 2020-04-24 NOTE — Progress Notes (Signed)
Remote pacemaker transmission.   

## 2020-07-11 ENCOUNTER — Other Ambulatory Visit: Payer: Self-pay | Admitting: Cardiology

## 2020-07-19 ENCOUNTER — Ambulatory Visit (INDEPENDENT_AMBULATORY_CARE_PROVIDER_SITE_OTHER): Payer: Medicare PPO | Admitting: *Deleted

## 2020-07-19 DIAGNOSIS — I482 Chronic atrial fibrillation, unspecified: Secondary | ICD-10-CM

## 2020-07-20 LAB — CUP PACEART REMOTE DEVICE CHECK
Date Time Interrogation Session: 20210902121617
Implantable Lead Implant Date: 20170719
Implantable Lead Implant Date: 20170719
Implantable Lead Location: 753859
Implantable Lead Location: 753860
Implantable Lead Model: 377
Implantable Lead Model: 377
Implantable Lead Serial Number: 49475245
Implantable Lead Serial Number: 49500032
Implantable Pulse Generator Implant Date: 20170719
Pulse Gen Model: 407145
Pulse Gen Serial Number: 68814684

## 2020-07-24 NOTE — Progress Notes (Signed)
Remote pacemaker transmission.   

## 2020-08-08 ENCOUNTER — Other Ambulatory Visit: Payer: Self-pay | Admitting: Cardiology

## 2020-09-01 ENCOUNTER — Other Ambulatory Visit: Payer: Self-pay | Admitting: Cardiology

## 2020-10-12 ENCOUNTER — Other Ambulatory Visit: Payer: Self-pay | Admitting: Cardiology

## 2020-10-18 ENCOUNTER — Ambulatory Visit (INDEPENDENT_AMBULATORY_CARE_PROVIDER_SITE_OTHER): Payer: Medicare PPO

## 2020-10-18 DIAGNOSIS — I482 Chronic atrial fibrillation, unspecified: Secondary | ICD-10-CM

## 2020-10-18 LAB — CUP PACEART REMOTE DEVICE CHECK
Battery Remaining Percentage: 70 %
Date Time Interrogation Session: 20211202115714
Implantable Lead Implant Date: 20170719
Implantable Lead Implant Date: 20170719
Implantable Lead Location: 753859
Implantable Lead Location: 753860
Implantable Lead Model: 377
Implantable Lead Model: 377
Implantable Lead Serial Number: 49475245
Implantable Lead Serial Number: 49500032
Implantable Pulse Generator Implant Date: 20170719
Lead Channel Impedance Value: 507 Ohm
Lead Channel Pacing Threshold Amplitude: 0.8 V
Lead Channel Pacing Threshold Pulse Width: 0.4 ms
Lead Channel Sensing Intrinsic Amplitude: 12 mV
Lead Channel Setting Pacing Amplitude: 2.4 V
Lead Channel Setting Pacing Pulse Width: 0.4 ms
Pulse Gen Model: 407145
Pulse Gen Serial Number: 68814684

## 2020-10-30 NOTE — Progress Notes (Signed)
Remote pacemaker transmission.   

## 2020-11-06 ENCOUNTER — Other Ambulatory Visit: Payer: Self-pay | Admitting: Cardiology

## 2020-11-28 ENCOUNTER — Telehealth: Payer: Self-pay | Admitting: Cardiology

## 2020-11-28 NOTE — Telephone Encounter (Signed)
Pt c/o swelling: STAT is pt has developed SOB within 24 hours  1) How much weight have you gained and in what time span? No weight gain  2) If swelling, where is the swelling located? Her left foot and ankle  3) Are you currently taking a fluid pill? Yes  4) Are you currently SOB? no  5) Do you have a log of your daily weights (if so, list)? no  6) Have you gained 3 pounds in a day or 5 pounds in a week?   7) Have you traveled recently?no- daughter think the swelling might be from eating at Christmas time. She says she is watching her diet carefully now. She wonder if any changes need to be made in her medicine?

## 2020-11-28 NOTE — Telephone Encounter (Signed)
Spoke with daughter RE: pt's edema in one ankle.  She reports pt has not been watching her NA+ intake and has started to have some swelling in just 1 ankle.  Daughter is asking for medications to be changed over the phone.  Advised since pt has not been seen since 07/2019 she would need in office eval and updated lab work.  Reviewed low salt diet information and fluid restrictions.  Daughter reports pt is being seen at Dr Jeannetta Nap office frequently and has had lab work there.  Advised daughter Dr Jeannetta Nap can eval and determine if she needs any medication changed and she should f/u there since she doesn't want to bring her into the office here for eval.  Daughter will have pt f/u with Dr Jeannetta Nap and start to limit her NA+ and fluid intake.

## 2020-12-31 ENCOUNTER — Other Ambulatory Visit: Payer: Self-pay | Admitting: Cardiology

## 2021-01-15 ENCOUNTER — Other Ambulatory Visit: Payer: Self-pay | Admitting: Cardiology

## 2021-01-17 ENCOUNTER — Ambulatory Visit (INDEPENDENT_AMBULATORY_CARE_PROVIDER_SITE_OTHER): Payer: Medicare PPO

## 2021-01-17 DIAGNOSIS — I255 Ischemic cardiomyopathy: Secondary | ICD-10-CM

## 2021-01-20 LAB — CUP PACEART REMOTE DEVICE CHECK
Date Time Interrogation Session: 20220302091838
Implantable Lead Implant Date: 20170719
Implantable Lead Implant Date: 20170719
Implantable Lead Location: 753859
Implantable Lead Location: 753860
Implantable Lead Model: 377
Implantable Lead Model: 377
Implantable Lead Serial Number: 49475245
Implantable Lead Serial Number: 49500032
Implantable Pulse Generator Implant Date: 20170719
Pulse Gen Model: 407145
Pulse Gen Serial Number: 68814684

## 2021-01-27 ENCOUNTER — Other Ambulatory Visit: Payer: Self-pay | Admitting: Cardiology

## 2021-01-28 ENCOUNTER — Telehealth: Payer: Self-pay | Admitting: Cardiology

## 2021-01-28 NOTE — Telephone Encounter (Signed)
Patient has an appointment scheduled for 02/06/21 with Dr. Anne Fu. Patient's daughter states she is scheduled to see her PCP around the same time. She is requesting that we send orders for what ever lab work may be needed so she can take care of all panels drawn at once. If this is possible, she may consider cancelling her follow up with Dr. Anne Fu. She is requesting to have the lab orders faxed to Dr. Shelah Lewandowsky with [facility] at [phone]. If questions, Dr. Hennie Duos office can be contacted at [phone]. Patient's daughter would like a confirmation call.

## 2021-01-28 NOTE — Telephone Encounter (Signed)
Pt is scheduled with B. Bhagat on 02/06/21.  Will forward message to him to see if any labs will be requested ahead of time.  Her PCP is drawing labs this week.   There are no orders in Epic.

## 2021-01-29 NOTE — Progress Notes (Signed)
Remote pacemaker transmission.   

## 2021-01-29 NOTE — Telephone Encounter (Signed)
Will send a fax to Dr. Jeannetta Nap of this message to see if they can get a BMET and CBC on patient when they see her this week, and fax results to [phone].

## 2021-01-31 NOTE — Telephone Encounter (Signed)
Called to discuss this information with Carol Snyder (on DPR) who reports she has already spoke with a PA a couple of days ago and has this all straightened out.  She denied needing any further assistance.

## 2021-02-06 ENCOUNTER — Other Ambulatory Visit: Payer: Self-pay

## 2021-02-06 ENCOUNTER — Ambulatory Visit: Payer: Medicare PPO | Admitting: Physician Assistant

## 2021-02-06 ENCOUNTER — Encounter: Payer: Self-pay | Admitting: Physician Assistant

## 2021-02-06 VITALS — BP 100/62 | HR 78 | Ht 67.0 in | Wt 99.8 lb

## 2021-02-06 DIAGNOSIS — I255 Ischemic cardiomyopathy: Secondary | ICD-10-CM

## 2021-02-06 DIAGNOSIS — I482 Chronic atrial fibrillation, unspecified: Secondary | ICD-10-CM | POA: Diagnosis not present

## 2021-02-06 DIAGNOSIS — Z950 Presence of cardiac pacemaker: Secondary | ICD-10-CM | POA: Diagnosis not present

## 2021-02-06 DIAGNOSIS — I5042 Chronic combined systolic (congestive) and diastolic (congestive) heart failure: Secondary | ICD-10-CM

## 2021-02-06 MED ORDER — POTASSIUM CHLORIDE CRYS ER 20 MEQ PO TBCR: 20.0000 meq | EXTENDED_RELEASE_TABLET | Freq: Every day | ORAL | 3 refills | Status: DC

## 2021-02-06 MED ORDER — DIGOXIN 125 MCG PO TABS: 0.0625 mg | ORAL_TABLET | Freq: Every day | ORAL | 3 refills | Status: DC

## 2021-02-06 MED ORDER — FUROSEMIDE 80 MG PO TABS: 80.0000 mg | ORAL_TABLET | Freq: Every day | ORAL | 3 refills | Status: DC

## 2021-02-06 MED ORDER — METOPROLOL SUCCINATE ER 100 MG PO TB24: 100.0000 mg | ORAL_TABLET | Freq: Every day | ORAL | 3 refills | Status: DC

## 2021-02-06 NOTE — Progress Notes (Signed)
Cardiology Office Note:    Date:  02/06/2021   ID:  Carol Snyder, DOB 08/20/1929, MRN [id number]  PCP:  Kaleen MaskElkins, Wilson Oliver, MD  [facility] Cardiologist:  Donato SchultzMark Skains, MD  [facility] Electrophysiologist:  None   Chief Complaint: 18 months follow up   History of Present Illness:    Carol Snyder is a 87 y.o. female with a hx of chronic heart failure with an EF of 25-30%, persistent atrial fibrillation on Digoxin and Coumadin with sinus node dysfunction s/p Biotronik PPM followed by Dr. Johney FrameAllred, hx of bilateral pleural effusions with prior thoracentesis seen for follow up.   She was hospitalized and seen by cardiology and EP 06/2019 . [facility], DOE and symptoms of orthopnea, found to be in acute/chronic CHF. She underwent a thoracentesis (R) and was admitted for IV diuresis. Updated TTE notes showed new reduction in LVEF 25-30%, observed to have unusual pacing behavior on telemetry with V pacing at rates to 120 range. No cath was pursued in the setting of no anginal symptoms. Medical management was recommended. Lopressor was changed to Toprol. EP was aksed to weigh in on her device programming. Per chart review, Dr. Graciela HusbandsKlein suspected pacing at higher rates were a function of CLS pacing. Per EP, CLS was inactivated and VVI was lower to 50.   Patient is here for follow-up with daughter.  Patient lives by herself with support.  Try to watch her diet.  No chest pain, shortness of breath, dizziness, palpitation, orthopnea, PND, syncope or lower extremity edema.  No melena or blood in her stool or urine.  Reports intermittent cough which she felt is due to sinus drainage.  Past Medical History:  Diagnosis Date  . Anemia   . Aortic insufficiency   . Chest pain   . Chronic diarrhea   . DNR (do not resuscitate)   . Falling   . History of GI bleed   . History of shingles   . Long term current use of anticoagulant   . Mitral regurgitation   . Orthostatic hypotension   .  Persistent atrial fibrillation (HCC)    on Coumadin  . Pleural effusion   . Sinus node dysfunction (HCC)    a. s/p Biotronik PPM.  . Stroke (HCC)   . Tricuspid regurgitation     Past Surgical History:  Procedure Laterality Date  . APPENDECTOMY    . CHOLECYSTECTOMY     with partial colectomy do to gallbadder infection   . IR THORACENTESIS ASP PLEURAL SPACE W/IMG GUIDE  06/22/2018  . IR THORACENTESIS ASP PLEURAL SPACE W/IMG GUIDE  09/29/2018  . IR THORACENTESIS ASP PLEURAL SPACE W/IMG GUIDE  07/05/2019  . PACEMAKER IMPLANT  06/04/2016   BTK Edora 8 DR-T implanted by Dr Rudolpho [facility]vinAkbary at [facility] for sick sinus syndrome    Current Medications: Current Meds  Medication Sig  . acetaminophen (TYLENOL) 325 MG tablet Take 650 mg by mouth every 4 (four) hours as needed for mild pain or fever (or if temp is 99.0 or greater for 72 hours.).  Marland Kitchen. alendronate (FOSAMAX) 70 MG tablet Take 70 mg by mouth once a week. Sunday  . cholestyramine (QUESTRAN) 4 g packet Take 2 g by mouth daily. Half pack daily  . fluticasone (FLONASE) 50 MCG/ACT nasal spray Place 1 spray into both nostrils as needed.  . furosemide (LASIX) 80 MG tablet Take 120 mg by mouth daily.  . hydroxypropyl methylcellulose / hypromellose (ISOPTO TEARS /  GONIOVISC) 2.5 % ophthalmic solution Place 1 drop into both eyes 3 (three) times daily as needed for dry eyes.  Marland Kitchen loperamide (IMODIUM) 2 MG capsule Take 4 mg by mouth daily.  Marland Kitchen loratadine (CLARITIN) 10 MG tablet Take 10 mg by mouth daily.  . Menthol, Topical Analgesic, (BIOFREEZE EX) Apply 1 application topically daily as needed (pain).  . nitroGLYCERIN (NITROSTAT) 0.4 MG SL tablet Place 1 tablet (0.4 mg total) under the tongue every 5 (five) minutes as needed for chest pain.  Marland Kitchen warfarin (COUMADIN) 5 MG tablet Take 2.5-5 mg by mouth See admin instructions. Take 2.5 mg  All the other days at bedtime Take 5 mg on Sunday  . [DISCONTINUED] digoxin (LANOXIN) 0.125 MG tablet Take 0.5 tablets  (0.0625 mg total) by mouth daily. Please keep upcoming appt in March 2022 before anymore refills. Thank you  . [DISCONTINUED] furosemide (LASIX) 80 MG tablet TAKE 1 TABLET BY MOUTH EVERY DAY (Patient taking differently: Take 120 mg by mouth daily.)  . [DISCONTINUED] metoprolol succinate (TOPROL-XL) 100 MG 24 hr tablet TAKE 1 TABLET (100 MG TOTAL) BY MOUTH DAILY. TAKE WITH OR IMMEDIATELY FOLLOWING A MEAL.  . [DISCONTINUED] potassium chloride SA (KLOR-CON M20) 20 MEQ tablet Take 1 tablet (20 mEq total) by mouth daily. Pt needs to keep upcoming appt in March for further refills     Allergies:   Codeine, Enbrel [etanercept], Fosamax [alendronate sodium], Hydromorphone, Motrin [ibuprofen], Sulfa antibiotics, Voltaren [diclofenac], and Naproxen sodium   Social History   Socioeconomic History  . Marital status: Single    Spouse name: Not on file  . Number of children: Not on file  . Years of education: Not on file  . Highest education level: Not on file  Occupational History  . Not on file  Tobacco Use  . Smoking status: Former Smoker    Quit date: 11/17/2004    Years since quitting: 16.2  . Smokeless tobacco: Never Used  Vaping Use  . Vaping Use: Never used  Substance and Sexual Activity  . Alcohol use: No  . Drug use: No  . Sexual activity: Not on file  Other Topics Concern  . Not on file  Social History Narrative  . Not on file   Social Determinants of Health   Financial Resource Strain: Not on file  Food Insecurity: Not on file  Transportation Needs: Not on file  Physical Activity: Not on file  Stress: Not on file  Social Connections: Not on file     Family History: The patient's family history includes Arrhythmia in her daughter; Cancer in her brother; Heart attack in her father; Hypertension in her father; Stroke in her mother.    ROS:   Please see the history of present illness.    All other systems reviewed and are negative.   EKGs/Labs/Other Studies Reviewed:     The following studies were reviewed today:  Echo 06/2019  There was no ST segment deviation noted during stress.  T wave inversion was noted during stress in the II, III and aVF leads.  Defect 1: There is a large defect of moderate severity present in the basal inferoseptal, basal inferior, basal inferolateral, mid inferoseptal, mid inferior and mid inferolateral location.  Findings consistent with subendocardial MI with peri-infarct ischemia of the inferior . inferioseptal and inferolateral walls.  Nuclear stress EF: 33%. The left ventricular ejection fraction is moderately decreased (30-44%).  This is a high risk study.   Echo 06/2019 1. The left ventricle has severely reduced  systolic function, with an  ejection fraction of 25-30%. The cavity size was normal. Left ventricular  diastolic function could not be evaluated secondary to atrial  fibrillation. Left ventricular diffuse  hypokinesis.  2. Severe akinesis of the left ventricular, entire inferior wall.  3. The right ventricle has normal systolic function. The cavity was  moderately enlarged. There is no increase in right ventricular wall  thickness.  4. Left atrial size was severely dilated.  5. Right atrial size was severely dilated.  6. Large pleural effusion in the right lateral region.  7. The mitral valve is abnormal. Moderate thickening of the mitral valve  leaflet. Mitral valve regurgitation is moderate to severe by color flow  Doppler.  8. The tricuspid valve is grossly normal. Tricuspid valve regurgitation  is mild-moderate.  9. The aortic valve is tricuspid. Mild calcification of the aortic valve.  Aortic valve regurgitation is mild by color flow Doppler. Mild-moderate  stenosis of the aortic valve.  10. The aorta is normal unless otherwise noted.  11. The inferior vena cava was dilated in size with <50% respiratory  variability.    EKG:  EKG is ordered today.  The ekg ordered today demonstrates  atrial fibrillation, PVC  Recent Labs: No results found for requested labs within last 8760 hours.  Recent Lipid Panel No results found for: CHOL, TRIG, HDL, CHOLHDL, VLDL, LDLCALC, LDLDIRECT   Risk Assessment/Calculations:   This indicates a 9.7% annual risk of stroke. The patient's score is based upon: CHF History: Yes HTN History: No Diabetes History: No Stroke History: Yes Vascular Disease History: No Age Score: 2 Gender Score: 1    Physical Exam:    VS:  BP 100/62   Pulse 78   Ht 5\' 7"  (1.702 m)   Wt 99 lb 12.8 oz (45.3 kg)   BMI 15.63 kg/m     Wt Readings from Last 3 Encounters:  02/06/21 99 lb 12.8 oz (45.3 kg)  09/12/19 107 lb (48.5 kg)  07/19/19 115 lb (52.2 kg)     GEN: Well nourished, well developed in no acute distress HEENT: Normal NECK: No JVD; No carotid bruits LYMPHATICS: No lymphadenopathy CARDIAC: RRR, no murmurs, rubs, gallops RESPIRATORY:  Clear to auscultation without rales, wheezing or rhonchi  ABDOMEN: Soft, non-tender, non-distended MUSCULOSKELETAL:  No edema; No deformity  SKIN: Warm and dry NEUROLOGIC:  Alert and oriented x 3 PSYCHIATRIC:  Normal affect   ASSESSMENT AND PLAN:    1. Chronic combined CHF Appears euvolemic by exam.  No heart failure symptoms.  Advised to limit salt intake.  Continue Lasix, digoxin and beta-blocker.  Advanced heart failure regimen limited due to soft blood pressure.  2.  Persistent atrial fibrillation Rate controlled.  No bleeding issue.  Continue metoprolol, digoxin and Coumadin.  3. S/p PPM - Followed by Dr. 09/18/19.   Medication Adjustments/Labs and Tests Ordered: Current medicines are reviewed at length with the patient today.  Concerns regarding medicines are outlined above.  Orders Placed This Encounter  Procedures  . EKG 12-Lead   Meds ordered this encounter  Medications  . digoxin (LANOXIN) 0.125 MG tablet    Sig: Take 0.5 tablets (0.0625 mg total) by mouth daily.    Dispense:  90  tablet    Refill:  3  . furosemide (LASIX) 80 MG tablet    Sig: Take 1 tablet (80 mg total) by mouth daily.    Dispense:  90 tablet    Refill:  3  . metoprolol succinate (TOPROL-XL) 100  MG 24 hr tablet    Sig: Take 1 tablet (100 mg total) by mouth daily. Take with or immediately following a meal.    Dispense:  90 tablet    Refill:  3  . potassium chloride SA (KLOR-CON M20) 20 MEQ tablet    Sig: Take 1 tablet (20 mEq total) by mouth daily.    Dispense:  90 tablet    Refill:  3    Patient Instructions  Medication Instructions:  Your physician recommends that you continue on your current medications as directed. Please refer to the Current Medication list given to you today.  *If you need a refill on your cardiac medications before your next appointment, please call your pharmacy*   Lab Work: None ordered  If you have labs (blood work) drawn today and your tests are completely normal, you will receive your results only by: Marland Kitchen MyChart Message (if you have MyChart) OR . A paper copy in the mail If you have any lab test that is abnormal or we need to change your treatment, we will call you to review the results.   Testing/Procedures: None ordered   Follow-Up: At [facility], you and your health needs are our priority.  As part of our continuing mission to provide you with exceptional heart care, we have created designated Provider Care Teams.  These Care Teams include your primary Cardiologist (physician) and Advanced Practice Providers (APPs -  Physician Assistants and Nurse Practitioners) who all work together to provide you with the care you need, when you need it.  We recommend signing up for the patient portal called "MyChart".  Sign up information is provided on this After Visit Summary.  MyChart is used to connect with patients for Virtual Visits (Telemedicine).  Patients are able to view lab/test results, encounter notes, upcoming appointments, etc.  Non-urgent messages can  be sent to your provider as well.   To learn more about what you can do with MyChart, go to [email or web address].    Your next appointment:   12 month(s)  The format for your next appointment:   In Person  Provider:   You may see Donato Schultz, MD or one of the following Advanced Practice Providers on your designated Care Team:    Georgie Chard, NP  Your physician recommends that you schedule a follow-up appointment in: Dr. Johney Frame or APP for your pacemaker.    Other Instructions      Signed, Manson Passey, [place]  02/06/2021 10:35 AM    [facility]

## 2021-02-06 NOTE — Patient Instructions (Addendum)
Medication Instructions:  Your physician recommends that you continue on your current medications as directed. Please refer to the Current Medication list given to you today.  *If you need a refill on your cardiac medications before your next appointment, please call your pharmacy*   Lab Work: None ordered  If you have labs (blood work) drawn today and your tests are completely normal, you will receive your results only by: Marland Kitchen MyChart Message (if you have MyChart) OR . A paper copy in the mail If you have any lab test that is abnormal or we need to change your treatment, we will call you to review the results.   Testing/Procedures: None ordered   Follow-Up: At [facility], you and your health needs are our priority.  As part of our continuing mission to provide you with exceptional heart care, we have created designated Provider Care Teams.  These Care Teams include your primary Cardiologist (physician) and Advanced Practice Providers (APPs -  Physician Assistants and Nurse Practitioners) who all work together to provide you with the care you need, when you need it.  We recommend signing up for the patient portal called "MyChart".  Sign up information is provided on this After Visit Summary.  MyChart is used to connect with patients for Virtual Visits (Telemedicine).  Patients are able to view lab/test results, encounter notes, upcoming appointments, etc.  Non-urgent messages can be sent to your provider as well.   To learn more about what you can do with MyChart, go to [email or web address].    Your next appointment:   12 month(s)  The format for your next appointment:   In Person  Provider:   You may see Donato Schultz, MD or one of the following Advanced Practice Providers on your designated Care Team:    Georgie Chard, NP  Your physician recommends that you schedule a follow-up appointment in: Dr. Johney Frame or APP for your pacemaker.    Other Instructions

## 2021-02-07 ENCOUNTER — Ambulatory Visit
Admission: RE | Admit: 2021-02-07 | Discharge: 2021-02-07 | Disposition: A | Discharge status: Home / Self Care | Payer: Medicare PPO | Source: Ambulatory Visit | Attending: Family Medicine | Admitting: Family Medicine

## 2021-02-07 ENCOUNTER — Other Ambulatory Visit: Payer: Self-pay | Admitting: Family Medicine

## 2021-02-07 DIAGNOSIS — R634 Abnormal weight loss: Secondary | ICD-10-CM

## 2021-02-12 ENCOUNTER — Other Ambulatory Visit: Payer: Self-pay | Admitting: Family Medicine

## 2021-02-12 ENCOUNTER — Other Ambulatory Visit: Payer: Medicare PPO | Admitting: Family Medicine

## 2021-02-12 DIAGNOSIS — R635 Abnormal weight gain: Secondary | ICD-10-CM

## 2021-02-12 DIAGNOSIS — R911 Solitary pulmonary nodule: Secondary | ICD-10-CM

## 2021-02-19 ENCOUNTER — Encounter: Payer: Medicare PPO | Admitting: Physician Assistant

## 2021-02-21 ENCOUNTER — Other Ambulatory Visit: Payer: Self-pay

## 2021-02-21 ENCOUNTER — Ambulatory Visit: Payer: Medicare PPO | Admitting: Physician Assistant

## 2021-02-21 ENCOUNTER — Encounter: Payer: Self-pay | Admitting: Physician Assistant

## 2021-02-21 VITALS — BP 106/60 | HR 68 | Ht 67.0 in | Wt 103.0 lb

## 2021-02-21 DIAGNOSIS — I5022 Chronic systolic (congestive) heart failure: Secondary | ICD-10-CM

## 2021-02-21 DIAGNOSIS — I5023 Acute on chronic systolic (congestive) heart failure: Secondary | ICD-10-CM | POA: Diagnosis not present

## 2021-02-21 DIAGNOSIS — Z950 Presence of cardiac pacemaker: Secondary | ICD-10-CM | POA: Diagnosis not present

## 2021-02-21 DIAGNOSIS — I482 Chronic atrial fibrillation, unspecified: Secondary | ICD-10-CM | POA: Diagnosis not present

## 2021-02-21 LAB — CUP PACEART INCLINIC DEVICE CHECK
Date Time Interrogation Session: 20220407172305
Implantable Lead Implant Date: 20170719
Implantable Lead Implant Date: 20170719
Implantable Lead Location: 753859
Implantable Lead Location: 753860
Implantable Lead Model: 377
Implantable Lead Model: 377
Implantable Lead Serial Number: 49475245
Implantable Lead Serial Number: 49500032
Implantable Pulse Generator Implant Date: 20170719
Lead Channel Impedance Value: 312 Ohm
Lead Channel Impedance Value: 526 Ohm
Lead Channel Pacing Threshold Amplitude: 0.8 V
Lead Channel Pacing Threshold Amplitude: 0.8 V
Lead Channel Pacing Threshold Amplitude: 0.9 V
Lead Channel Pacing Threshold Pulse Width: 0.4 ms
Lead Channel Pacing Threshold Pulse Width: 0.4 ms
Lead Channel Pacing Threshold Pulse Width: 0.4 ms
Lead Channel Sensing Intrinsic Amplitude: 1.1 mV
Lead Channel Sensing Intrinsic Amplitude: 11.7 mV
Lead Channel Sensing Intrinsic Amplitude: 11.7 mV
Lead Channel Setting Pacing Amplitude: 2.4 V
Lead Channel Setting Pacing Pulse Width: 0.4 ms
Pulse Gen Model: 407145
Pulse Gen Serial Number: 68814684

## 2021-02-21 NOTE — Progress Notes (Signed)
Cardiology Office Note Date:  02/21/2021  Patient ID:  Carol, Snyder May 05, 1929, MRN [id number] PCP:  Kaleen Mask, MD  Cardiologist:  Dr. Anne Fu Electrophysiologist: Allred    Chief Complaint: over due device visit  History of Present Illness: Carol Snyder is a 87 y.o. female with history of permanent AFib,  SSSx w/PPM, stroke, chronic CHF (systolic)  He comes in today to be seen for Dr. Johney Frame, last seen by him 2019, discussed on warfarin, declined OAC. No changes were made, planned for annual EP APP visits.  Note during a hospital stay her CLS pacing was programmed off 2/2 pacing in the 120's >> VVI 50  She ihas been followed by Dr. Anne Fu and his team since then.  Most recently by Demetra Shiner, PA 02/06/21, doing well, no changes were made.  TODAY She is accompanied by her daughter She reports no concerns, denies any CP, palpitations or cardiac awareness No dizzy spells, near syncope or syncope. She has poor appetite and losing weight Daughter mentions other GI issues, diarrhea. Following with IM  Follows warfarin with her PMD Labs done there regularly No bleeding or signs of bleeding    Device information Biotronik dual chamber PPM, implanted 06/04/2016 by Dr. Rudolpho Sevin   Past Medical History:  Diagnosis Date  . Anemia   . Aortic insufficiency   . Chest pain   . Chronic diarrhea   . DNR (do not resuscitate)   . Falling   . History of GI bleed   . History of shingles   . Long term current use of anticoagulant   . Mitral regurgitation   . Orthostatic hypotension   . Persistent atrial fibrillation (HCC)    on Coumadin  . Pleural effusion   . Sinus node dysfunction (HCC)    a. s/p Biotronik PPM.  . Stroke (HCC)   . Tricuspid regurgitation     Past Surgical History:  Procedure Laterality Date  . APPENDECTOMY    . CHOLECYSTECTOMY     with partial colectomy do to gallbadder infection   . IR THORACENTESIS ASP PLEURAL SPACE W/IMG GUIDE  06/22/2018  . IR  THORACENTESIS ASP PLEURAL SPACE W/IMG GUIDE  09/29/2018  . IR THORACENTESIS ASP PLEURAL SPACE W/IMG GUIDE  07/05/2019  . PACEMAKER IMPLANT  06/04/2016   BTK Edora 8 DR-T implanted by Dr Rudolpho Sevin at [facility] for sick sinus syndrome    Current Outpatient Medications  Medication Sig Dispense Refill  . acetaminophen (TYLENOL) 325 MG tablet Take 650 mg by mouth every 4 (four) hours as needed for mild pain or fever (or if temp is 99.0 or greater for 72 hours.).    Marland Kitchen alendronate (FOSAMAX) 70 MG tablet Take 70 mg by mouth once a week. Sunday    . cholestyramine (QUESTRAN) 4 g packet Take 2 g by mouth daily. Half pack daily    . digoxin (LANOXIN) 0.125 MG tablet Take 0.5 tablets (0.0625 mg total) by mouth daily. 90 tablet 3  . fluticasone (FLONASE) 50 MCG/ACT nasal spray Place 1 spray into both nostrils as needed.    . furosemide (LASIX) 80 MG tablet Take 120 mg by mouth daily.    . furosemide (LASIX) 80 MG tablet Take 1 tablet (80 mg total) by mouth daily. 90 tablet 3  . hydroxypropyl methylcellulose / hypromellose (ISOPTO TEARS / GONIOVISC) 2.5 % ophthalmic solution Place 1 drop into both eyes 3 (three) times daily as needed for dry eyes.    Marland Kitchen loperamide (IMODIUM)  2 MG capsule Take 4 mg by mouth daily.    Marland Kitchen loratadine (CLARITIN) 10 MG tablet Take 10 mg by mouth daily.    . Menthol, Topical Analgesic, (BIOFREEZE EX) Apply 1 application topically daily as needed (pain).    . metoprolol succinate (TOPROL-XL) 100 MG 24 hr tablet Take 1 tablet (100 mg total) by mouth daily. Take with or immediately following a meal. 90 tablet 3  . nitroGLYCERIN (NITROSTAT) 0.4 MG SL tablet Place 1 tablet (0.4 mg total) under the tongue every 5 (five) minutes as needed for chest pain. 25 tablet 6  . potassium chloride SA (KLOR-CON M20) 20 MEQ tablet Take 1 tablet (20 mEq total) by mouth daily. 90 tablet 3  . warfarin (COUMADIN) 5 MG tablet Take 2.5-5 mg by mouth See admin instructions. Take 2.5 mg  All the other  days at bedtime Take 5 mg on Sunday     No current facility-administered medications for this visit.    Allergies:   Codeine, Enbrel [etanercept], Fosamax [alendronate sodium], Hydromorphone, Motrin [ibuprofen], Sulfa antibiotics, Voltaren [diclofenac], and Naproxen sodium   Social History:  The patient  reports that she quit smoking about 16 years ago. She has never used smokeless tobacco. She reports that she does not drink alcohol and does not use drugs.   Family History:  The patient's family history includes Arrhythmia in her daughter; Cancer in her brother; Heart attack in her father; Hypertension in her father; Stroke in her mother.  ROS:  Please see the history of present illness.    All other systems are reviewed and otherwise negative.   PHYSICAL EXAM:  VS:  BP 106/60   Pulse 68   Ht 5\' 7"  (1.702 m)   Wt 103 lb (46.7 kg)   SpO2 99%   BMI 16.13 kg/m  BMI: Body mass index is 16.13 kg/m. Well nourished, well developed, in no acute distress HEENT: normocephalic, atraumatic Neck: no JVD, carotid bruits or masses Cardiac:  RRR; no significant murmurs, no rubs, or gallops Lungs:  CTA b/l, no wheezing, rhonchi or rales Abd: soft, nontender MS: no deformity, advaned atrophy Ext: no edema Skin: warm and dry, no rash Neuro:  No gross deficits appreciated Psych: euthymic mood, full affect  PPM site is stable, no tethering or discomfort, skin is intact, no thinning   EKG:  Not done today  Device interrogation done today and reviewed by myself:  Battery and lead measurements are good HVR are AF, longest 26 seconds   stress 06/2019  There was no ST segment deviation noted during stress.  T wave inversion was noted during stress in the II, III and aVF leads.  Defect 1: There is a large defect of moderate severity present in the basal inferoseptal, basal inferior, basal inferolateral, mid inferoseptal, mid inferior and mid inferolateral location.  Findings consistent  with subendocardial MI with peri-infarct ischemia of the inferior . inferioseptal and inferolateral walls.  Nuclear stress EF: 33%. The left ventricular ejection fraction is moderately decreased (30-44%).  This is a high risk study.   Echo 06/2019 1. The left ventricle has severely reduced systolic function, with an  ejection fraction of 25-30%. The cavity size was normal. Left ventricular  diastolic function could not be evaluated secondary to atrial  fibrillation. Left ventricular diffuse  hypokinesis.  2. Severe akinesis of the left ventricular, entire inferior wall.  3. The right ventricle has normal systolic function. The cavity was  moderately enlarged. There is no increase in right ventricular  wall  thickness.  4. Left atrial size was severely dilated.  5. Right atrial size was severely dilated.  6. Large pleural effusion in the right lateral region.  7. The mitral valve is abnormal. Moderate thickening of the mitral valve  leaflet. Mitral valve regurgitation is moderate to severe by color flow  Doppler.  8. The tricuspid valve is grossly normal. Tricuspid valve regurgitation  is mild-moderate.  9. The aortic valve is tricuspid. Mild calcification of the aortic valve.  Aortic valve regurgitation is mild by color flow Doppler. Mild-moderate  stenosis of the aortic valve.  10. The aorta is normal unless otherwise noted.  11. The inferior vena cava was dilated in size with <50% respiratory  variability.    Recent Labs: No results found for requested labs within last 8760 hours.  No results found for requested labs within last 8760 hours.   CrCl cannot be calculated (Patient's most recent lab result is older than the maximum 21 days allowed.).   Wt Readings from Last 3 Encounters:  02/21/21 103 lb (46.7 kg)  02/06/21 99 lb 12.8 oz (45.3 kg)  09/12/19 107 lb (48.5 kg)     Other studies reviewed: Additional studies/records reviewed today include: summarized  above  ASSESSMENT AND PLAN:  1. PPM     Intact function, no programming changes made  2. Permanent AFib     CHA2DS2Vasc is 6, on warfarin, monitored and managed by her PMD     She prefers staying with warfarin, has been offered the switch previously  3. chronic CHF (systolic)     No symptoms or exam findings of volume OL     On BB, dig, furosemide     Her last labs by her scanned labs her daughter has on her phine do not include a dig level     She sees her PMD regularly     They will ask if no dig level done in the last year to request one be done, reporting she is a very hard stick and prefer labs done there      C/w Dr. Anne Fu   Disposition: F/u with remotes as usual and in clinic with EP in a year, sooner if needed. Dr.Skains as directed by him  Current medicines are reviewed at length with the patient today.  The patient did not have any concerns regarding medicines.  Norma Fredrickson, PA-C 02/21/2021 5:26 PM     [facility] [address] [place] [address] [phone] (office)  [phone] (fax)

## 2021-02-21 NOTE — Patient Instructions (Signed)

## 2021-02-23 ENCOUNTER — Other Ambulatory Visit: Payer: Medicare PPO

## 2021-03-04 ENCOUNTER — Ambulatory Visit
Admission: RE | Admit: 2021-03-04 | Discharge: 2021-03-04 | Disposition: A | Discharge status: Home / Self Care | Payer: Medicare PPO | Source: Ambulatory Visit | Attending: Family Medicine | Admitting: Family Medicine

## 2021-03-04 ENCOUNTER — Other Ambulatory Visit: Payer: Self-pay

## 2021-03-04 DIAGNOSIS — R911 Solitary pulmonary nodule: Secondary | ICD-10-CM

## 2021-03-04 DIAGNOSIS — R635 Abnormal weight gain: Secondary | ICD-10-CM

## 2021-03-06 ENCOUNTER — Other Ambulatory Visit: Payer: Self-pay | Admitting: Family Medicine

## 2021-03-06 DIAGNOSIS — R634 Abnormal weight loss: Secondary | ICD-10-CM

## 2021-03-06 DIAGNOSIS — R1310 Dysphagia, unspecified: Secondary | ICD-10-CM

## 2021-03-09 ENCOUNTER — Other Ambulatory Visit: Payer: Medicare PPO

## 2021-03-27 ENCOUNTER — Other Ambulatory Visit: Payer: Medicare PPO

## 2021-04-17 LAB — CUP PACEART REMOTE DEVICE CHECK
Battery Remaining Percentage: 65 %
Brady Statistic RV Percent Paced: 4 %
Date Time Interrogation Session: 20220601081350
Implantable Lead Implant Date: 20170719
Implantable Lead Implant Date: 20170719
Implantable Lead Location: 753859
Implantable Lead Location: 753860
Implantable Lead Model: 377
Implantable Lead Model: 377
Implantable Lead Serial Number: 49475245
Implantable Lead Serial Number: 49500032
Implantable Pulse Generator Implant Date: 20170719
Lead Channel Impedance Value: 488 Ohm
Lead Channel Pacing Threshold Amplitude: 0.9 V
Lead Channel Pacing Threshold Pulse Width: 0.4 ms
Lead Channel Sensing Intrinsic Amplitude: 9.3 mV
Lead Channel Setting Pacing Amplitude: 2.4 V
Lead Channel Setting Pacing Pulse Width: 0.4 ms
Pulse Gen Model: 407145
Pulse Gen Serial Number: 68814684

## 2021-04-18 ENCOUNTER — Ambulatory Visit (INDEPENDENT_AMBULATORY_CARE_PROVIDER_SITE_OTHER): Payer: Medicare PPO

## 2021-04-18 DIAGNOSIS — I255 Ischemic cardiomyopathy: Secondary | ICD-10-CM

## 2021-04-20 IMAGING — CT CT CHEST W/O CM
1 series · 14 of 34 positions shown, 18 images · non-contrast
Comparison: 02/07/2021, 06/22/2018

CLINICAL DATA: 25 pound weight loss in 1 year, history of right
middle lobe pulmonary nodule

EXAM:
CT CHEST WITHOUT CONTRAST
TECHNIQUE: Multidetector CT imaging of the chest was performed following the
standard protocol without IV contrast.

[Series 2: chest w/(date) · axial · 0.55mm/px · z∈[-274,-24]mm · 14 of 147 slices shown, 18 images]
[im 11/147  mediastinal]
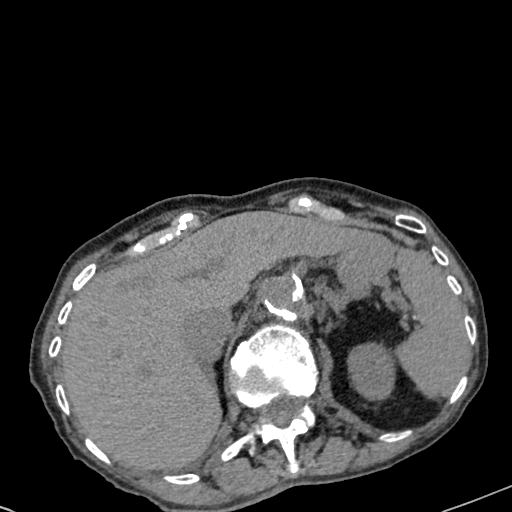
[im 11/147  lung]
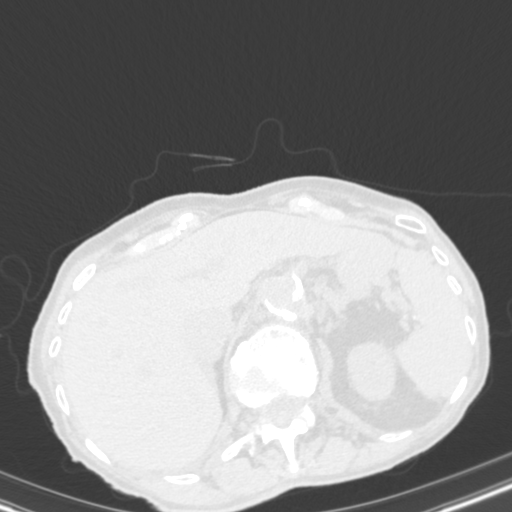
[im 22/147  lung]
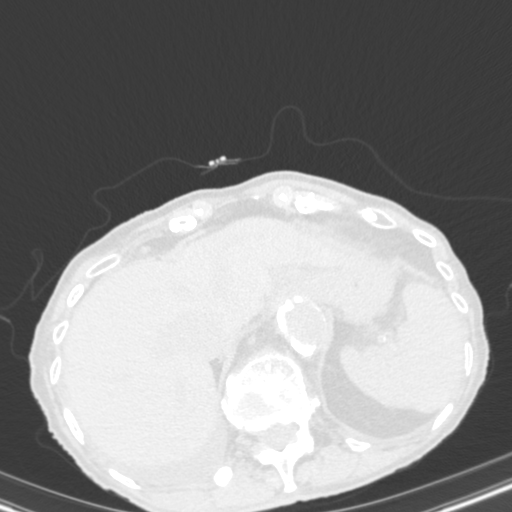
[im 30/147  lung]
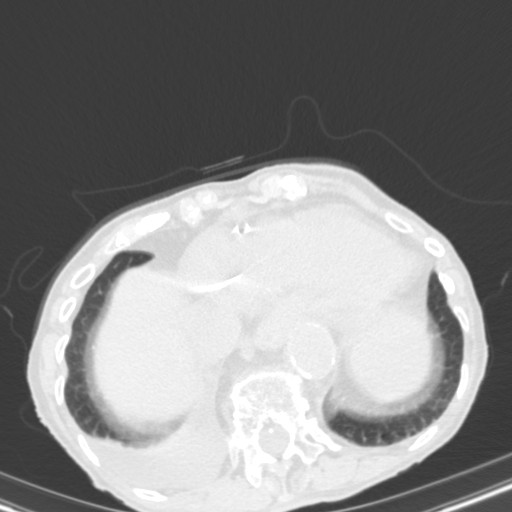
[im 44/147  lung]
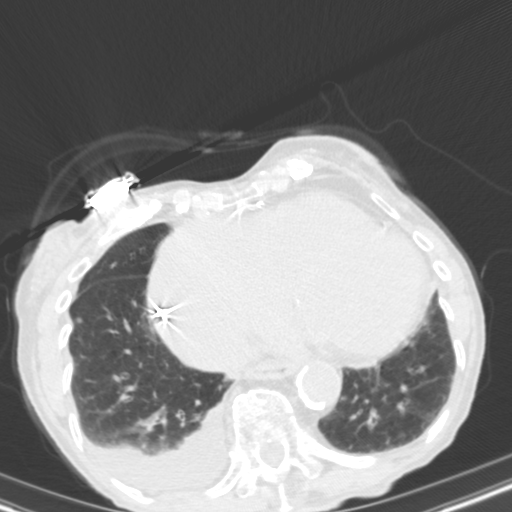
[im 55/147  mediastinal]
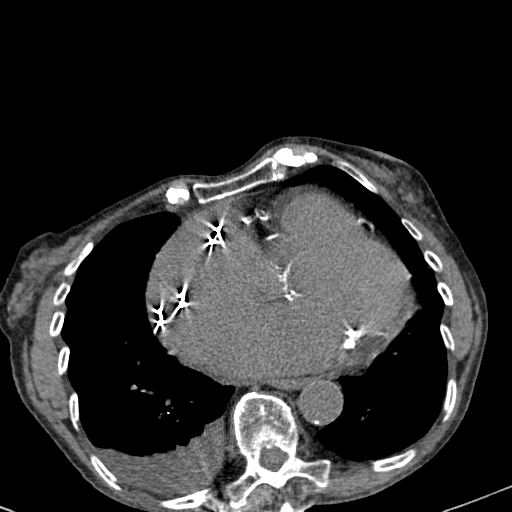
[im 55/147  lung]
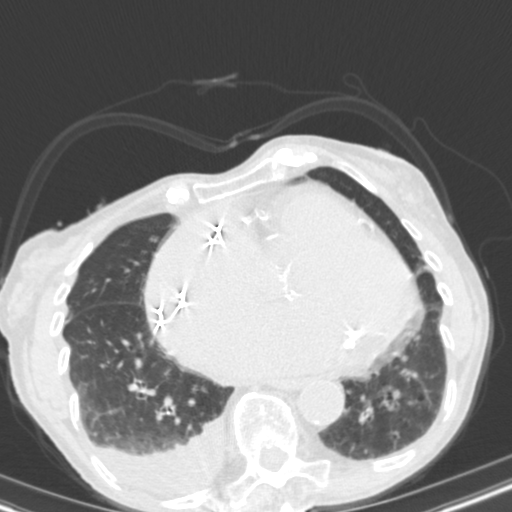
[im 60/147  lung]
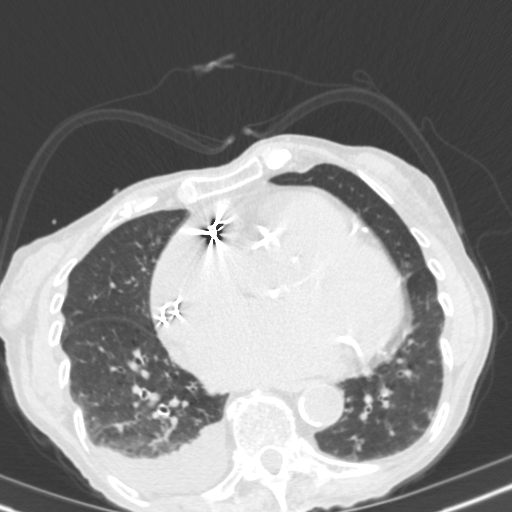
[im 69/147  lung]
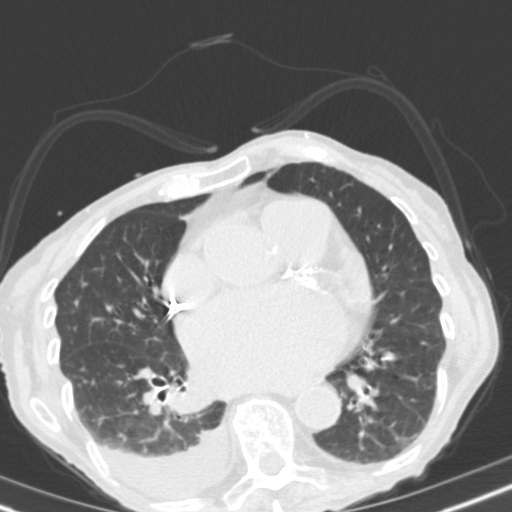
[im 78/147  lung]
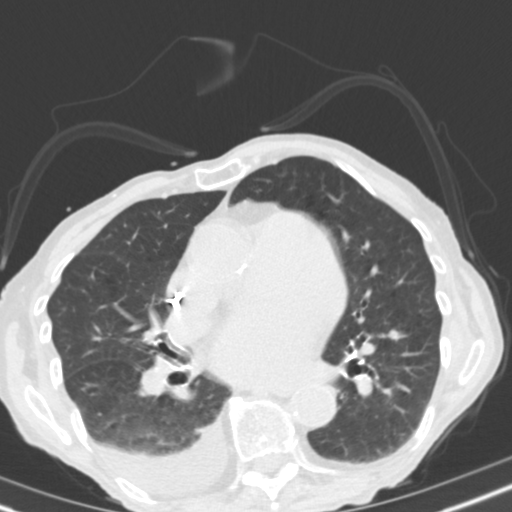
[im 87/147  mediastinal]
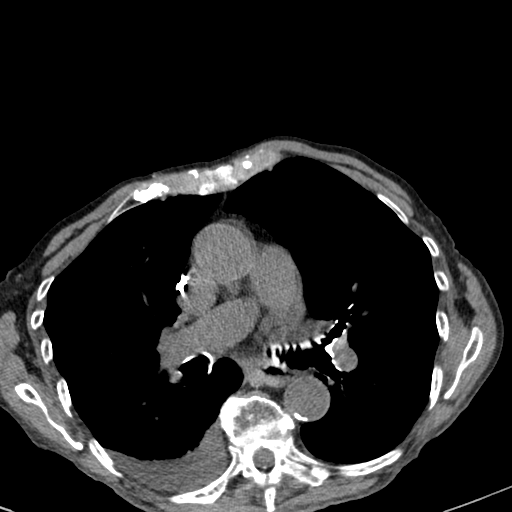
[im 87/147  lung]
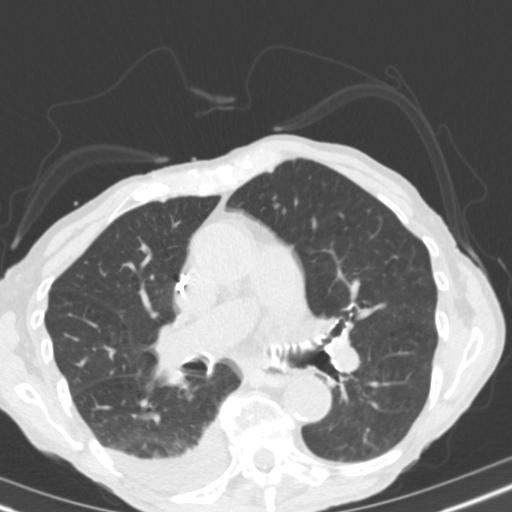
[im 92/147  lung]
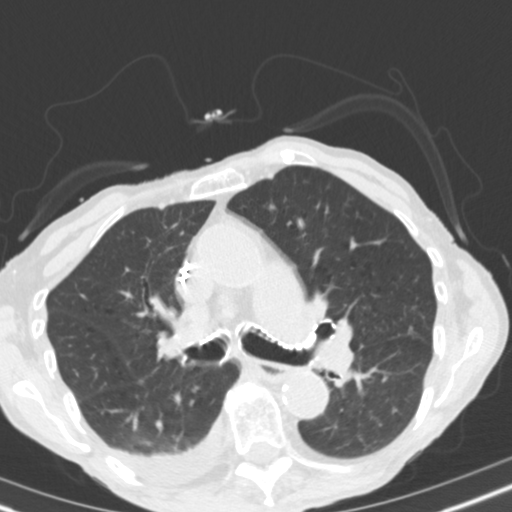
[im 109/147  lung]
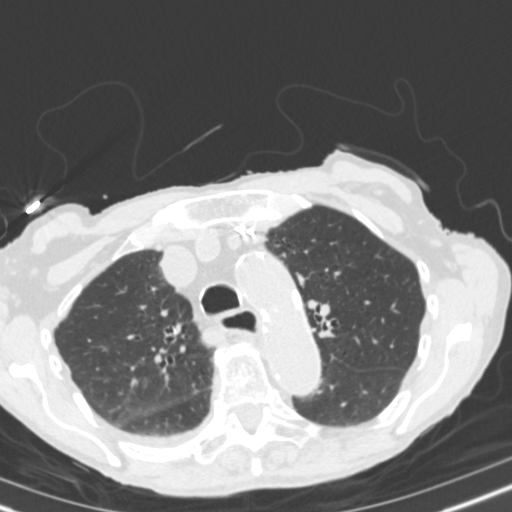
[im 117/147  lung]
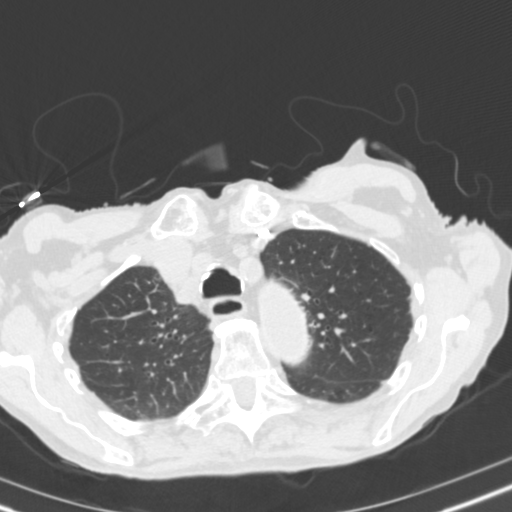
[im 125/147  mediastinal]
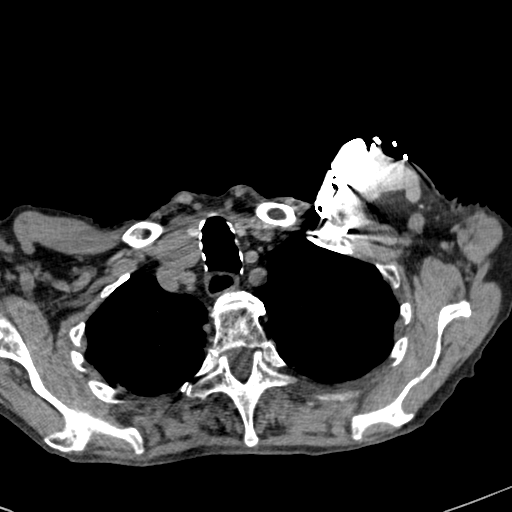
[im 125/147  lung]
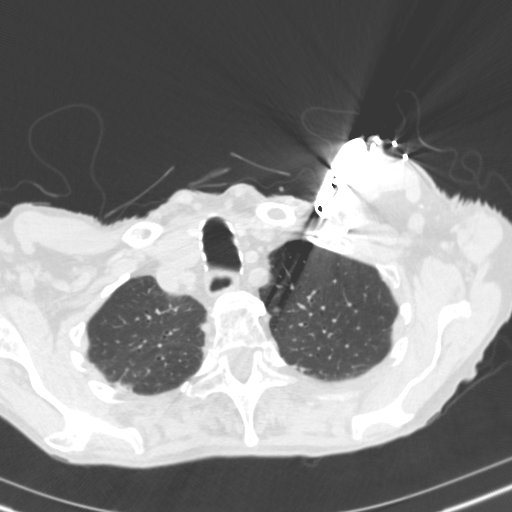
[im 136/147  lung]
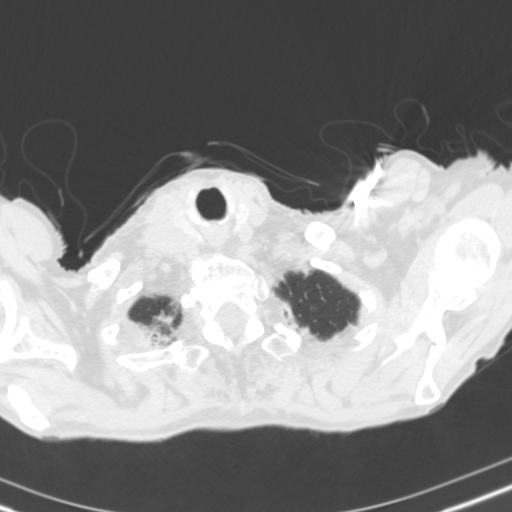

[14 of 34 positions shown; findings below may reference images not displayed]

FINDINGS: Cardiovascular: Unenhanced imaging of the heart demonstrates marked
cardiomegaly, with prominent biatrial dilatation. Multi lead
pacemaker is noted. There is severe atherosclerosis of the coronary
vasculature unchanged. No pericardial effusion.

Normal caliber of the thoracic aorta. Diffuse atherosclerosis
unchanged.

Mediastinum/Nodes: No enlarged mediastinal or axillary lymph nodes.
Thyroid gland, trachea, and esophagus demonstrate no significant
findings.

Lungs/Pleura: There is a stable 4 mm right middle lobe pulmonary
nodule image 80/6. Subpleural nodule right lower lobe image 106/6
measures 3 mm, not seen on prior study though significant
atelectasis was identified in this region previously.

There is a benign calcified granuloma within the right middle lobe.
I do not see any nodules within the right upper lobe to correspond
to the recent chest x-ray finding, this may reflect superimposed
shadows.

Mild background emphysema. Small right pleural effusion, volume
estimated less than 500 cc, decreased in size since prior study. No
pneumothorax. Central airways are patent.

Upper Abdomen: No acute abnormality.

Musculoskeletal: Sclerotic area within the left anterolateral eighth
rib is new since 4052 but nonspecific. Areas of sclerosis within the
right anterior fourth, fifth, and seventh ribs are also noted, new
since prior study. Given the configuration and appearance, findings
are most consistent with healed bilateral rib fractures.

There are no acute bony abnormalities. Stable compression
deformities of the lower thoracic spine. Reconstructed images
demonstrate no additional findings.
IMPRESSION: 1. Benign 4 mm right middle lobe pulmonary nodule unchanged since
December 20, 17 mm subpleural right lower lobe pulmonary nodule, with an
appearance most consistent with intrapulmonary lymph node.
3. No abnormality in the right upper lobe to correspond to the
recent chest x-ray finding. Density on chest x-ray likely reflect
superimposition of shadows.
4. Small right pleural effusion.
5. Chronic bilateral healed rib fractures which have developed in
the interim.
6.  Aortic Atherosclerosis ([id number]).
7. Cardiomegaly.

## 2021-04-30 ENCOUNTER — Telehealth: Payer: Self-pay | Admitting: Physician Assistant

## 2021-04-30 NOTE — Telephone Encounter (Signed)
Patient's mom said that the patient has a clear mucas coming out when she eats and when she drinks. It's a regularity. Can't eat from spitting up.

## 2021-04-30 NOTE — Telephone Encounter (Signed)
Spoke with pts daughter concerning her complaints. She states her mother "spits up" a clear foamy mucous. There denies and accompanying cough or pink tinged sputum, SOB or CP. She states her mother can "feel it in the back of her throat" before spitting it up. It has been happening several times per day.   I advised pt to contact her mother's primary care provider as this does not seem to be cardiac related. We discussed it may be more sinus or GI related and it would be more appropriate for her PCP to manage. She had no further questions.

## 2021-05-08 ENCOUNTER — Other Ambulatory Visit: Payer: Self-pay | Admitting: Family Medicine

## 2021-05-08 ENCOUNTER — Ambulatory Visit
Admission: RE | Admit: 2021-05-08 | Discharge: 2021-05-08 | Disposition: A | Discharge status: Home / Self Care | Payer: Medicare PPO | Source: Ambulatory Visit | Attending: Family Medicine | Admitting: Family Medicine

## 2021-05-08 DIAGNOSIS — R634 Abnormal weight loss: Secondary | ICD-10-CM

## 2021-05-08 DIAGNOSIS — R1310 Dysphagia, unspecified: Secondary | ICD-10-CM

## 2021-05-10 ENCOUNTER — Other Ambulatory Visit: Payer: Medicare PPO

## 2021-05-13 NOTE — Progress Notes (Signed)
Remote pacemaker transmission.   

## 2021-06-18 ENCOUNTER — Other Ambulatory Visit: Payer: Self-pay | Admitting: Gastroenterology

## 2021-06-18 DIAGNOSIS — R933 Abnormal findings on diagnostic imaging of other parts of digestive tract: Secondary | ICD-10-CM

## 2021-07-11 ENCOUNTER — Emergency Department (HOSPITAL_COMMUNITY): Payer: Medicare PPO

## 2021-07-11 ENCOUNTER — Emergency Department (HOSPITAL_COMMUNITY)
Admission: EM | Admit: 2021-07-11 | Discharge: 2021-07-11 | Disposition: A | Discharge status: Home / Self Care | Payer: Medicare PPO | Attending: Emergency Medicine | Admitting: Emergency Medicine

## 2021-07-11 DIAGNOSIS — Z7901 Long term (current) use of anticoagulants: Secondary | ICD-10-CM | POA: Diagnosis not present

## 2021-07-11 DIAGNOSIS — Z8679 Personal history of other diseases of the circulatory system: Secondary | ICD-10-CM | POA: Diagnosis not present

## 2021-07-11 DIAGNOSIS — Z87891 Personal history of nicotine dependence: Secondary | ICD-10-CM | POA: Insufficient documentation

## 2021-07-11 DIAGNOSIS — R1110 Vomiting, unspecified: Secondary | ICD-10-CM | POA: Diagnosis not present

## 2021-07-11 DIAGNOSIS — Z79899 Other long term (current) drug therapy: Secondary | ICD-10-CM | POA: Insufficient documentation

## 2021-07-11 DIAGNOSIS — E860 Dehydration: Secondary | ICD-10-CM | POA: Diagnosis present

## 2021-07-11 DIAGNOSIS — Z950 Presence of cardiac pacemaker: Secondary | ICD-10-CM | POA: Diagnosis not present

## 2021-07-11 DIAGNOSIS — I5023 Acute on chronic systolic (congestive) heart failure: Secondary | ICD-10-CM | POA: Diagnosis not present

## 2021-07-11 DIAGNOSIS — R1310 Dysphagia, unspecified: Secondary | ICD-10-CM | POA: Diagnosis not present

## 2021-07-11 DIAGNOSIS — U071 COVID-19: Secondary | ICD-10-CM | POA: Diagnosis not present

## 2021-07-11 DIAGNOSIS — Z8673 Personal history of transient ischemic attack (TIA), and cerebral infarction without residual deficits: Secondary | ICD-10-CM | POA: Diagnosis not present

## 2021-07-11 LAB — CBC WITH DIFFERENTIAL/PLATELET
Abs Immature Granulocytes: 0.03 10*3/uL (ref 0.00–0.07)
Basophils Absolute: 0.1 10*3/uL (ref 0.0–0.1)
Basophils Relative: 1 %
Eosinophils Absolute: 0.3 10*3/uL (ref 0.0–0.5)
Eosinophils Relative: 4 %
HCT: 36.8 % (ref 36.0–46.0)
Hemoglobin: 11.1 g/dL — ABNORMAL LOW (ref 12.0–15.0)
Immature Granulocytes: 0 %
Lymphocytes Relative: 20 %
Lymphs Abs: 1.5 10*3/uL (ref 0.7–4.0)
MCH: 30.5 pg (ref 26.0–34.0)
MCHC: 30.2 g/dL (ref 30.0–36.0)
MCV: 101.1 fL — ABNORMAL HIGH (ref 80.0–100.0)
Monocytes Absolute: 0.6 10*3/uL (ref 0.1–1.0)
Monocytes Relative: 8 %
Neutro Abs: 5 10*3/uL (ref 1.7–7.7)
Neutrophils Relative %: 67 %
Platelets: 189 10*3/uL (ref 150–400)
RBC: 3.64 MIL/uL — ABNORMAL LOW (ref 3.87–5.11)
RDW: 13.5 % (ref 11.5–15.5)
WBC: 7.5 10*3/uL (ref 4.0–10.5)
nRBC: 0 % (ref 0.0–0.2)

## 2021-07-11 LAB — RESP PANEL BY RT-PCR (FLU A&B, COVID) ARPGX2
Influenza A by PCR: NEGATIVE
Influenza B by PCR: NEGATIVE
SARS Coronavirus 2 by RT PCR: POSITIVE — AB

## 2021-07-11 LAB — COMPREHENSIVE METABOLIC PANEL
ALT: 14 U/L (ref 0–44)
AST: 20 U/L (ref 15–41)
Albumin: 4.4 g/dL (ref 3.5–5.0)
Alkaline Phosphatase: 53 U/L (ref 38–126)
Anion gap: 7 (ref 5–15)
BUN: 38 mg/dL — ABNORMAL HIGH (ref 8–23)
CO2: 26 mmol/L (ref 22–32)
Calcium: 9.3 mg/dL (ref 8.9–10.3)
Chloride: 108 mmol/L (ref 98–111)
Creatinine, Ser: 1.36 mg/dL — ABNORMAL HIGH (ref 0.44–1.00)
GFR, Estimated: 37 mL/min — ABNORMAL LOW (ref >60)
Glucose, Bld: 119 mg/dL — ABNORMAL HIGH (ref 70–99)
Potassium: 3.6 mmol/L (ref 3.5–5.1)
Sodium: 141 mmol/L (ref 135–145)
Total Bilirubin: 0.7 mg/dL (ref 0.3–1.2)
Total Protein: 7.4 g/dL (ref 6.5–8.1)

## 2021-07-11 LAB — URINALYSIS, ROUTINE W REFLEX MICROSCOPIC
Bilirubin Urine: NEGATIVE
Glucose, UA: NEGATIVE mg/dL
Hgb urine dipstick: NEGATIVE
Ketones, ur: NEGATIVE mg/dL
Leukocytes,Ua: NEGATIVE
Nitrite: NEGATIVE
Protein, ur: NEGATIVE mg/dL
Specific Gravity, Urine: 1.012 (ref 1.005–1.030)
pH: 5 (ref 5.0–8.0)

## 2021-07-11 LAB — LIPASE, BLOOD: Lipase: 42 U/L (ref 11–51)

## 2021-07-11 MED ORDER — SODIUM CHLORIDE 0.9 % IV SOLN: Freq: Once | INTRAVENOUS | Status: AC

## 2021-07-11 MED ORDER — IOHEXOL 350 MG/ML SOLN
60.0000 mL | Freq: Once | INTRAVENOUS | Status: AC | PRN
  Administered 2021-07-11: 60 mL via INTRAVENOUS

## 2021-07-11 MED ORDER — ONDANSETRON HCL 4 MG/2ML IJ SOLN
4.0000 mg | Freq: Once | INTRAMUSCULAR | Status: AC
  Administered 2021-07-11: 4 mg via INTRAVENOUS
  Filled 2021-07-11: qty 2

## 2021-07-11 NOTE — Discharge Instructions (Addendum)
Follow-up with your doctor as needed.  Drink thickened liquids

## 2021-07-11 NOTE — ED Provider Notes (Signed)
[facility]-EMERGENCY DEPT Provider Note   CSN: [id number] Arrival date & time: 07/11/21  1409     History Chief Complaint  Patient presents with   Failure To Thrive    Carol Snyder is a 87 y.o. female.  Pt presents to the ED today with vomiting.  Pt said she can eat solids, but is vomiting all liquids.  She denies any pain.  She has had this happen to her in the past and she became dehydrated.  She denies any f/c.  She has lost about 20 lbs over the last year.  Pt's daughter said she has had this problem in the past.  She has seen [facility] and had a barium swallow.  The daughter said the test was inconclusive.  The GI doc wanted to repeat it, but then she was feeling better, so she never repeated it.  No prior EGD.      Past Medical History:  Diagnosis Date   Anemia    Aortic insufficiency    Chest pain    Chronic diarrhea    DNR (do not resuscitate)    Falling    History of GI bleed    History of shingles    Long term current use of anticoagulant    Mitral regurgitation    Orthostatic hypotension    Persistent atrial fibrillation (HCC)    on Coumadin   Pleural effusion    Sinus node dysfunction (HCC)    a. s/p Biotronik PPM.   Stroke [facility])    Tricuspid regurgitation     Patient Active Problem List   Diagnosis Date Noted   Acute on chronic systolic congestive heart failure (HCC) 07/05/2019   Hypokalemia 09/28/2018   Recurrent right pleural effusion 06/20/2018   Protein-calorie malnutrition, severe 06/07/2018   Dehydration    Laceration of right lower leg    Fall 06/06/2018   AKI (acute kidney injury) (HCC) 06/06/2018   Diarrhea 06/06/2018   Pacemaker 06/06/2018   Chronic anticoagulation 06/06/2018   Atrial fibrillation, chronic (HCC) 07/04/2011    Past Surgical History:  Procedure Laterality Date   APPENDECTOMY     CHOLECYSTECTOMY     with partial colectomy do to gallbadder infection    IR THORACENTESIS ASP PLEURAL SPACE W/IMG  GUIDE  06/22/2018   IR THORACENTESIS ASP PLEURAL SPACE W/IMG GUIDE  09/29/2018   IR THORACENTESIS ASP PLEURAL SPACE W/IMG GUIDE  07/05/2019   PACEMAKER IMPLANT  06/04/2016   BTK [place] 8 DR-T implanted by Dr Rudolpho Sevin at [facility] for sick sinus syndrome     OB History   No obstetric history on file.     Family History  Problem Relation Age of Onset   Heart attack Father    Hypertension Father    Stroke Mother    Cancer Brother        lung   Arrhythmia Daughter        heart palpitations    Social History   Tobacco Use   Smoking status: Former    Types: Cigarettes    Quit date: 11/17/2004    Years since quitting: 16.6   Smokeless tobacco: Never  Vaping Use   Vaping Use: Never used  Substance Use Topics   Alcohol use: No   Drug use: No    Home Medications Prior to Admission medications   Medication Sig Start Date End Date Taking? Authorizing Provider  acetaminophen (TYLENOL) 325 MG tablet Take 650 mg by mouth every 4 (four)  hours as needed for mild pain or fever (or if temp is 99.0 or greater for 72 hours.).    [provider]  alendronate (FOSAMAX) 70 MG tablet Take 70 mg by mouth once a week. Sunday    [provider]  cholestyramine (QUESTRAN) 4 g packet Take 2 g by mouth daily. Half pack daily    [provider]  digoxin (LANOXIN) 0.125 MG tablet Take 0.5 tablets (0.0625 mg total) by mouth daily. 02/06/21   Bhagat, Bhavinkumar, PA  fluticasone (FLONASE) 50 MCG/ACT nasal spray Place 1 spray into both nostrils as needed. 08/22/20   [provider]  furosemide (LASIX) 80 MG tablet Take 120 mg by mouth daily.    [provider]  furosemide (LASIX) 80 MG tablet Take 1 tablet (80 mg total) by mouth daily. 02/06/21   Bhagat, Sharrell Ku, PA  hydroxypropyl methylcellulose / hypromellose (ISOPTO TEARS / GONIOVISC) 2.5 % ophthalmic solution Place 1 drop into both eyes 3 (three) times daily as needed for dry eyes.    [provider]  loperamide (IMODIUM) 2 MG capsule Take 4 mg by mouth daily.    [provider]  loratadine (CLARITIN) 10 MG tablet Take 10 mg by mouth daily.    [provider]  Menthol, Topical Analgesic, (BIOFREEZE EX) Apply 1 application topically daily as needed (pain).    [provider]  metoprolol succinate (TOPROL-XL) 100 MG 24 hr tablet Take 1 tablet (100 mg total) by mouth daily. Take with or immediately following a meal. 02/06/21   Bhagat, Bhavinkumar, PA  nitroGLYCERIN (NITROSTAT) 0.4 MG SL tablet Place 1 tablet (0.4 mg total) under the tongue every 5 (five) minutes as needed for chest pain. 09/12/19   Georgie Chard D, NP  potassium chloride SA (KLOR-CON M20) 20 MEQ tablet Take 1 tablet (20 mEq total) by mouth daily. 02/06/21   Manson Passey, PA  warfarin (COUMADIN) 5 MG tablet Take 2.5-5 mg by mouth See admin instructions. Take 2.5 mg  All the other days at bedtime Take 5 mg on Sunday    [provider]    Allergies    Codeine, Enbrel [etanercept], Fosamax [alendronate sodium], Hydromorphone, Motrin [ibuprofen], Sulfa antibiotics, Voltaren [diclofenac], and Naproxen sodium  Review of Systems   Review of Systems  Gastrointestinal:  Positive for nausea and vomiting.  All other systems reviewed and are negative.  Physical Exam Updated Vital Signs BP 110/80   Pulse 74   Temp 98.7 F (37.1 C) (Oral)   Resp 18   Ht 5\' 8"  (1.727 m)   Wt 45.4 kg   SpO2 94%   BMI 15.20 kg/m   Physical Exam Vitals and nursing note reviewed.  Constitutional:      Appearance: Normal appearance. She is underweight.  HENT:     Head: Normocephalic and atraumatic.     Right Ear: External ear normal.     Left Ear: External ear normal.     Nose: Nose normal.     Mouth/Throat:     Mouth: Mucous membranes are dry.  Eyes:     Extraocular Movements: Extraocular movements intact.     Conjunctiva/sclera: Conjunctivae normal.     Pupils: Pupils are equal,  round, and reactive to light.  Cardiovascular:     Rate and Rhythm: Normal rate and regular rhythm.     Pulses: Normal pulses.     Heart sounds: Normal heart sounds.  Pulmonary:     Effort: Pulmonary effort is normal.  Breath sounds: Normal breath sounds.  Abdominal:     General: Abdomen is flat. Bowel sounds are normal.     Palpations: Abdomen is soft.  Musculoskeletal:        General: Normal range of motion.     Cervical back: Normal range of motion and neck supple.  Skin:    General: Skin is warm.     Capillary Refill: Capillary refill takes less than 2 seconds.  Neurological:     General: No focal deficit present.     Mental Status: She is alert and oriented to person, place, and time.  Psychiatric:        Mood and Affect: Mood normal.        Behavior: Behavior normal.    ED Results / Procedures / Treatments   Labs (all labs ordered are listed, but only abnormal results are displayed) Labs Reviewed  CBC WITH DIFFERENTIAL/PLATELET - Abnormal; Notable for the following components:      Result Value   RBC 3.64 (*)    Hemoglobin 11.1 (*)    MCV 101.1 (*)    All other components within normal limits  COMPREHENSIVE METABOLIC PANEL - Abnormal; Notable for the following components:   Glucose, Bld 119 (*)    BUN 38 (*)    Creatinine, Ser 1.36 (*)    GFR, Estimated 37 (*)    All other components within normal limits  RESP PANEL BY RT-PCR (FLU A&B, COVID) ARPGX2  LIPASE, BLOOD  URINALYSIS, ROUTINE W REFLEX MICROSCOPIC    EKG None  Radiology DG Chest Portable 1 View  Result Date: 07/11/2021 CLINICAL DATA:  Nausea, vomiting EXAM: PORTABLE CHEST 1 VIEW COMPARISON:  CT chest 03/04/2021, chest radiograph 02/07/2021 FINDINGS: A left chest wall cardiac device is stable. The cardiomediastinal silhouette is stable, with unchanged cardiomegaly. There is calcified atherosclerotic plaque of the aortic arch. There is no focal consolidation or pulmonary edema. There is a small right  pleural effusion. There is no left pleural effusion. There is no pneumothorax. A calcified granuloma in the right midlung is unchanged. There is no acute osseous abnormality. IMPRESSION: Cardiomegaly and trace right pleural effusion. Electronically Signed   By: Lesia Hausen M.D.   On: 07/11/2021 15:24    Procedures Procedures   Medications Ordered in ED Medications  ondansetron (ZOFRAN) injection 4 mg (4 mg Intravenous Given 07/11/21 1509)  0.9 %  sodium chloride infusion ( Intravenous New Bag/Given 07/11/21 1508)    ED Course  I have reviewed the triage vital signs and the nursing notes.  Pertinent labs & imaging results that were available during my care of the patient were reviewed by me and considered in my medical decision making (see chart for details).    MDM Rules/Calculators/A&P                           Pt has some mild aki.  IVFs ordered.  CT chest/ abd/pelvis ordered for further eval of sx.    Pt signed out to Dr. Freida Busman at shift change. Final Clinical Impression(s) / ED Diagnoses Final diagnoses:  Dehydration  Dysphagia, unspecified type    Rx / DC Orders ED Discharge Orders     None        Jacalyn Lefevre, MD 07/11/21 1640

## 2021-07-11 NOTE — ED Triage Notes (Signed)
Pt presents to ED via ems cc vomiting fluids. Pt states vomiting liquids over the past week. Pt states being able to eat solid foods. Per ems, pt had similar episodes in the past.

## 2021-07-11 NOTE — ED Provider Notes (Signed)
Patient signed to me by Dr. Particia Nearing pending work-up which is negative here.  Will discharge home   Lorre Nick, MD 07/11/21 [phone]

## 2021-07-17 ENCOUNTER — Other Ambulatory Visit: Payer: Medicare PPO

## 2021-07-18 ENCOUNTER — Ambulatory Visit (INDEPENDENT_AMBULATORY_CARE_PROVIDER_SITE_OTHER): Payer: Medicare PPO

## 2021-07-18 DIAGNOSIS — I255 Ischemic cardiomyopathy: Secondary | ICD-10-CM | POA: Diagnosis not present

## 2021-07-23 ENCOUNTER — Other Ambulatory Visit: Payer: Self-pay | Admitting: Gastroenterology

## 2021-07-23 ENCOUNTER — Ambulatory Visit
Admission: RE | Admit: 2021-07-23 | Discharge: 2021-07-23 | Disposition: A | Discharge status: Home / Self Care | Payer: Medicare PPO | Source: Ambulatory Visit | Attending: Gastroenterology | Admitting: Gastroenterology

## 2021-07-23 DIAGNOSIS — R933 Abnormal findings on diagnostic imaging of other parts of digestive tract: Secondary | ICD-10-CM

## 2021-07-23 LAB — CUP PACEART REMOTE DEVICE CHECK
Battery Remaining Percentage: 65 %
Brady Statistic RV Percent Paced: 6 %
Date Time Interrogation Session: 20220901084135
Implantable Lead Implant Date: 20170719
Implantable Lead Implant Date: 20170719
Implantable Lead Location: 753859
Implantable Lead Location: 753860
Implantable Lead Model: 377
Implantable Lead Model: 377
Implantable Lead Serial Number: 49475245
Implantable Lead Serial Number: 49500032
Implantable Pulse Generator Implant Date: 20170719
Lead Channel Impedance Value: 507 Ohm
Lead Channel Pacing Threshold Amplitude: 1 V
Lead Channel Pacing Threshold Pulse Width: 0.4 ms
Lead Channel Sensing Intrinsic Amplitude: 9.4 mV
Lead Channel Setting Pacing Amplitude: 2.4 V
Lead Channel Setting Pacing Pulse Width: 0.4 ms
Pulse Gen Model: 407145
Pulse Gen Serial Number: 68814684

## 2021-07-25 ENCOUNTER — Telehealth: Payer: Self-pay

## 2021-07-25 NOTE — Telephone Encounter (Signed)
   [facility] Pre-operative Risk Assessment    Patient Name: Carol Snyder  DOB: August 24, 1929 MRN: [id number]  HEARTCARE STAFF:  - IMPORTANT!!!!!! Under Visit Info/Reason for Call, type in Other and utilize the format Clearance MM/DD/YY or Clearance TBD. Do not use dashes or single digits. - Please review there is not already an duplicate clearance open for this procedure. - If request is for dental extraction, please clarify the # of teeth to be extracted. - If the patient is currently at the dentist's office, call Pre-Op Callback Staff (MA/nurse) to input urgent request.  - If the patient is not currently in the dentist office, please route to the Pre-Op pool.  Request for surgical clearance:  What type of surgery is being performed? Endoscopy   When is this surgery scheduled? TBD  What type of clearance is required (medical clearance vs. Pharmacy clearance to hold med vs. Both)? Both   Are there any medications that need to be held prior to surgery and how long? Warfarin 5 days prior   Practice name and name of physician performing surgery? [facility], Dr Alessandra Bevels  What is the office phone number? [phone]   7.   What is the office fax number? [phone]  8.   Anesthesia type (None, local, MAC, general) ? Propofol    Mendel Ryder 07/25/2021, 4:20 PM  _________________________________________________________________   (provider comments below)

## 2021-07-26 NOTE — Telephone Encounter (Signed)
Patient with diagnosis of afib on warfarin for anticoagulation.    Procedure: Endoscopy  Date of procedure: TBD  CHA2DS2-VASc Score = 6   This indicates a 9.7% annual risk of stroke. The patient's score is based upon: CHF History: 1 HTN History: 0 Diabetes History: 0 Stroke History: 2 Vascular Disease History: 0 Age Score: 2 Gender Score: 1      CrCl 19 ml/min Platelet count 189  Patient is high risk off warfarin due to hx of stroke. However due to patients age, weight and creatine clearance she is also high bleed risk with lovenox. I will defer to MD.

## 2021-07-26 NOTE — Telephone Encounter (Signed)
Patient has a history of permanent atrial fibrillation and a CVA.  Clinical pharmacist to review Coumadin

## 2021-07-29 ENCOUNTER — Other Ambulatory Visit: Payer: Self-pay | Admitting: Gastroenterology

## 2021-07-30 NOTE — Telephone Encounter (Signed)
Appears warfarin is managed by another provider- most likely her PCP. Please advise patient she will need to have her managing provider coordinate bridge

## 2021-07-30 NOTE — Telephone Encounter (Signed)
Attempted to reach pt's PCP about Lovenox bridge. Office closes at 4:30 PM. Will try again tomorrow.

## 2021-07-30 NOTE — Telephone Encounter (Signed)
Dr. Anne Fu Pt will need EGD. Can she be bridged with lovenox?

## 2021-07-30 NOTE — Progress Notes (Signed)
Remote pacemaker transmission.   

## 2021-07-30 NOTE — Telephone Encounter (Signed)
Left VM to evaluate symptoms.   She had a high risk nuclear stress test in 2020 in the setting of reduced EF. Suspect ischemia in the inferior wall where she likely had an inferior infarct.  Case was discussed with Dr. Anne Fu and the interventional team. Decision was made to treat this medically.    Per Dr. Anne Fu: Given her prior stroke history and high CHADS VASC would recommend Lovenox bridge.  She is at higher risk for bleeding given age and renal function   She is unable to complete 4.0 METS.   GI has confirmed: Repeated alternative test (barium swallow) shows persistent narrowing in the distal esophagus.  She has lost 15 pounds of weight in last few months.  She is high risk for MACE given her co-morbidities: 11% according to the RCRI.  Will need a phone call with her and family to review her risks.

## 2021-07-30 NOTE — Telephone Encounter (Signed)
   Name: Carol Snyder  DOB: [birth year]  MRN: [id number]   Primary Cardiologist: Donato Schultz, MD  Chart reviewed as part of pre-operative protocol coverage. Patient was contacted 07/30/2021 in reference to pre-operative risk assessment for pending surgery as outlined below.  Carol Snyder was last seen by Francis Dowse PAC on 02/21/21.  Since that day, Carol Snyder has done well.   She had a high risk nuclear stress test in 2020 in the setting of reduced LVEF. Suspect ischemia in the inferior wall where she likely had an inferior infarct.  Case was discussed with Dr. Anne Fu and the interventional team. Decision was made to treat this medically. She is unable to complete 4.0 METS, but is not complaining of angina or dyspnea. Given her co-morbid conditions, she has an 11% risk of MACE, per RCRI. I do not think additional cardiovascular testing at this time would mitigate this risk. I have reviewed this with her daughters Carol Snyder and Carol Snyder. I have also reviewed the risk of stroke and need for lovenox bridge.  Per Dr. Anne Fu: Given her prior stroke history and high CHADS VASC would recommend Lovenox bridge. She is at higher risk for bleeding given age and renal function.   Confirmed with GI there is no alternative procedure.   Coumadin managed by PCP, so will defer to him to coordinate lovenox bridging.    I will route this recommendation to the requesting party via Epic fax function and remove from pre-op pool. Please call with questions.  Roe Rutherford Dinora Hemm, PA 07/30/2021, 4:28 PM

## 2021-07-31 NOTE — Telephone Encounter (Signed)
Called Dr Hennie Duos office to make them aware of the need for lovenox bridging prior to procedure. They will coordinate this with the patient.

## 2021-08-02 ENCOUNTER — Encounter (HOSPITAL_COMMUNITY): Payer: Self-pay | Admitting: Gastroenterology

## 2021-08-12 ENCOUNTER — Ambulatory Visit (INDEPENDENT_AMBULATORY_CARE_PROVIDER_SITE_OTHER): Payer: Medicare PPO | Admitting: Otolaryngology

## 2021-08-12 NOTE — Anesthesia Preprocedure Evaluation (Addendum)
Anesthesia Evaluation  Patient identified by MRN, date of birth, ID band Patient awake    Reviewed: Allergy & Precautions, H&P , NPO status , Patient's Chart, lab work & pertinent test results  Airway Mallampati: II  TM Distance: >3 FB Neck ROM: Full    Dental no notable dental hx. (+) Edentulous Upper, Poor Dentition, Dental Advisory Given   Pulmonary neg pulmonary ROS, former smoker,    Pulmonary exam normal breath sounds clear to auscultation       Cardiovascular Exercise Tolerance: Good +CHF  + dysrhythmias Atrial Fibrillation + pacemaker + Valvular Problems/Murmurs MR  Rhythm:Regular Rate:Normal     Neuro/Psych CVA negative psych ROS   GI/Hepatic negative GI ROS, Neg liver ROS,   Endo/Other  negative endocrine ROS  Renal/GU Renal disease  negative genitourinary   Musculoskeletal   Abdominal   Peds  Hematology  (+) Blood dyscrasia, anemia ,   Anesthesia Other Findings   Reproductive/Obstetrics negative OB ROS                            Anesthesia Physical Anesthesia Plan  ASA: 4  Anesthesia Plan: MAC   Post-op Pain Management:    Induction: Intravenous  PONV Risk Score and Plan: 2 and Treatment may vary due to age or medical condition  Airway Management Planned: Nasal Cannula  Additional Equipment:   Intra-op Plan:   Post-operative Plan:   Informed Consent: I have reviewed the patients History and Physical, chart, labs and discussed the procedure including the risks, benefits and alternatives for the proposed anesthesia with the patient or authorized representative who has indicated his/her understanding and acceptance.     Dental advisory given  Plan Discussed with: CRNA  Anesthesia Plan Comments:        Anesthesia Quick Evaluation

## 2021-08-13 ENCOUNTER — Ambulatory Visit (HOSPITAL_COMMUNITY): Payer: Medicare PPO | Admitting: Anesthesiology

## 2021-08-13 ENCOUNTER — Encounter (HOSPITAL_COMMUNITY)
Admission: RE | Disposition: A | Discharge status: Home / Self Care | Payer: Self-pay | Source: Home / Self Care | Attending: Gastroenterology

## 2021-08-13 ENCOUNTER — Other Ambulatory Visit: Payer: Self-pay

## 2021-08-13 ENCOUNTER — Ambulatory Visit (HOSPITAL_COMMUNITY)
Admission: RE | Admit: 2021-08-13 | Discharge: 2021-08-13 | Disposition: A | Discharge status: Home / Self Care | Payer: Medicare PPO | Attending: Gastroenterology | Admitting: Gastroenterology

## 2021-08-13 ENCOUNTER — Encounter (HOSPITAL_COMMUNITY): Payer: Self-pay | Admitting: Gastroenterology

## 2021-08-13 DIAGNOSIS — I509 Heart failure, unspecified: Secondary | ICD-10-CM | POA: Diagnosis not present

## 2021-08-13 DIAGNOSIS — K449 Diaphragmatic hernia without obstruction or gangrene: Secondary | ICD-10-CM | POA: Insufficient documentation

## 2021-08-13 DIAGNOSIS — R1310 Dysphagia, unspecified: Secondary | ICD-10-CM | POA: Diagnosis present

## 2021-08-13 DIAGNOSIS — Z7901 Long term (current) use of anticoagulants: Secondary | ICD-10-CM | POA: Diagnosis not present

## 2021-08-13 DIAGNOSIS — K2210 Ulcer of esophagus without bleeding: Secondary | ICD-10-CM | POA: Diagnosis not present

## 2021-08-13 DIAGNOSIS — I4821 Permanent atrial fibrillation: Secondary | ICD-10-CM | POA: Insufficient documentation

## 2021-08-13 DIAGNOSIS — Z950 Presence of cardiac pacemaker: Secondary | ICD-10-CM | POA: Insufficient documentation

## 2021-08-13 DIAGNOSIS — K222 Esophageal obstruction: Secondary | ICD-10-CM | POA: Diagnosis not present

## 2021-08-13 DIAGNOSIS — Z87891 Personal history of nicotine dependence: Secondary | ICD-10-CM | POA: Diagnosis not present

## 2021-08-13 DIAGNOSIS — Z888 Allergy status to other drugs, medicaments and biological substances status: Secondary | ICD-10-CM | POA: Insufficient documentation

## 2021-08-13 DIAGNOSIS — Z79899 Other long term (current) drug therapy: Secondary | ICD-10-CM | POA: Insufficient documentation

## 2021-08-13 DIAGNOSIS — K2950 Unspecified chronic gastritis without bleeding: Secondary | ICD-10-CM | POA: Diagnosis not present

## 2021-08-13 HISTORY — DX: Cardiac arrhythmia, unspecified: I49.9

## 2021-08-13 HISTORY — PX: BIOPSY: SHX5522

## 2021-08-13 HISTORY — PX: ESOPHAGOGASTRODUODENOSCOPY (EGD) WITH PROPOFOL: SHX5813

## 2021-08-13 SURGERY — ESOPHAGOGASTRODUODENOSCOPY (EGD) WITH PROPOFOL
Anesthesia: Monitor Anesthesia Care

## 2021-08-13 MED ORDER — PROPOFOL 1000 MG/100ML IV EMUL
INTRAVENOUS | Status: AC
  Filled 2021-08-13: qty 100

## 2021-08-13 MED ORDER — PROPOFOL 500 MG/50ML IV EMUL
INTRAVENOUS | Status: AC
  Filled 2021-08-13: qty 50

## 2021-08-13 MED ORDER — PANTOPRAZOLE SODIUM 40 MG PO TBEC: 40.0000 mg | DELAYED_RELEASE_TABLET | Freq: Two times a day (BID) | ORAL | 0 refills | Status: AC

## 2021-08-13 MED ORDER — LACTATED RINGERS IV SOLN
INTRAVENOUS | Status: DC
  Administered 2021-08-13: 1000 mL via INTRAVENOUS

## 2021-08-13 MED ORDER — SODIUM CHLORIDE 0.9 % IV SOLN: INTRAVENOUS | Status: DC

## 2021-08-13 MED ORDER — PROPOFOL 10 MG/ML IV BOLUS
INTRAVENOUS | Status: AC
  Filled 2021-08-13: qty 20

## 2021-08-13 MED ORDER — SUCRALFATE 1 GM/10ML PO SUSP: 1.0000 g | Freq: Two times a day (BID) | ORAL | 0 refills | Status: AC

## 2021-08-13 MED ORDER — PROPOFOL 500 MG/50ML IV EMUL
INTRAVENOUS | Status: DC | PRN
  Administered 2021-08-13: 50 ug/kg/min via INTRAVENOUS

## 2021-08-13 SURGICAL SUPPLY — 15 items

## 2021-08-13 NOTE — Anesthesia Postprocedure Evaluation (Signed)
Anesthesia Post Note  Patient: KALAYSIA DEMONBREUN  Procedure(s) Performed: ESOPHAGOGASTRODUODENOSCOPY (EGD) WITH PROPOFOL Balloon dilation wire-guided BIOPSY     Patient location during evaluation: Endoscopy Anesthesia Type: MAC Level of consciousness: awake and alert Pain management: pain level controlled Vital Signs Assessment: post-procedure vital signs reviewed and stable Respiratory status: spontaneous breathing, nonlabored ventilation and respiratory function stable Cardiovascular status: stable and blood pressure returned to baseline Postop Assessment: no apparent nausea or vomiting Anesthetic complications: no   No notable events documented.  Last Vitals:  Vitals:   08/13/21 0825 08/13/21 0830  BP:  (!) 117/52  Pulse: (!) 50 68  Resp: 15 15  Temp:    SpO2: (!) 88% 95%    Last Pain:  Vitals:   08/13/21 0825  TempSrc:   PainSc: 0-No pain                 Yaseen Gilberg,W. EDMOND

## 2021-08-13 NOTE — Transfer of Care (Signed)
Immediate Anesthesia Transfer of Care Note  Patient: Carol Snyder  Procedure(s) Performed: ESOPHAGOGASTRODUODENOSCOPY (EGD) WITH PROPOFOL Balloon dilation wire-guided BIOPSY  Patient Location: Endoscopy Unit  Anesthesia Type:MAC  Level of Consciousness: awake, alert , oriented and patient cooperative  Airway & Oxygen Therapy: Patient Spontanous Breathing and Patient connected to face mask oxygen  Post-op Assessment: Report given to RN, Post -op Vital signs reviewed and stable and Patient moving all extremities  Post vital signs: Reviewed and stable  Last Vitals:  Vitals Value Taken Time  BP 100/45 08/13/21 0814  Temp    Pulse 62 08/13/21 0818  Resp 17 08/13/21 0819  SpO2 95 % 08/13/21 0818  Vitals shown include unvalidated device data.  Last Pain:  Vitals:   08/13/21 0646  TempSrc: Oral  PainSc: 0-No pain         Complications: No notable events documented.

## 2021-08-13 NOTE — Op Note (Signed)
[facility] Patient Name: Carol Snyder Procedure Date: 08/13/2021 MRN: [id number] Attending MD: Kathi Der , MD Date of Birth: 02-09-1929 CSN: [id number] Age: 87 Admit Type: Outpatient Procedure:                Upper GI endoscopy Indications:              Dysphagia, Stricture of the esophagus, Abnormal                            cine-esophagram Providers:                Kathi Der, MD, Fransisca Connors, Beryle Beams, Technician Referring MD:              Medicines:                 Complications:            No immediate complications. Estimated Blood Loss:     Estimated blood loss was minimal. Procedure:                Pre-Anesthesia Assessment:                           - Prior to the procedure, a History and Physical                            was performed, and patient medications and                            allergies were reviewed. The patient's tolerance of                            previous anesthesia was also reviewed. The risks                            and benefits of the procedure and the sedation                            options and risks were discussed with the patient.                            All questions were answered, and informed consent                            was obtained. Prior Anticoagulants: The patient                            last took Coumadin (warfarin) 5 days and Lovenox                            (enoxaparin) 1 day prior to the procedure. ASA                            Grade Assessment: IV - A  patient with severe                            systemic disease that is a constant threat to life.                            After reviewing the risks and benefits, the patient                            was deemed in satisfactory condition to undergo the                            procedure.                           After obtaining informed consent, the endoscope was                             passed under direct vision. Throughout the                            procedure, the patient's blood pressure, pulse, and                            oxygen saturations were monitored continuously. The                            GIF-H190 ([id number]) Olympus endoscope was introduced                            through the mouth, and advanced to the second part                            of duodenum. The upper GI endoscopy was                            accomplished without difficulty. The patient                            tolerated the procedure well. Scope In: Scope Out: Findings:      One benign-appearing, intrinsic moderate (circumferential scarring or       stenosis; an endoscope may pass) stenosis was found at the       gastroesophageal junction. The stenosis was traversed. A TTS dilator was       passed through the scope. Dilation with a 12-13.5-15 mm balloon dilator       was performed to 15 mm. The dilation site was examined following       endoscope reinsertion and showed no perforation.      One superficial esophageal ulcer with no bleeding and no stigmata of       recent bleeding was found at the gastroesophageal junction. Biopsies       were taken with a cold forceps for histology.      A small hiatal hernia was present.      Scattered mild inflammation characterized by congestion (edema)  and       erythema was found in the entire examined stomach.      The cardia and gastric fundus were normal on retroflexion.      The duodenal bulb, first portion of the duodenum and second portion of       the duodenum were normal. Impression:               - Benign-appearing esophageal stenosis. Dilated.                           - Esophageal ulcer with no bleeding and no stigmata                            of recent bleeding. Biopsied.                           - Small hiatal hernia.                           - Chronic gastritis.                           - Normal duodenal bulb, first  portion of the                            duodenum and second portion of the duodenum. Moderate Sedation:      Moderate (conscious) sedation was personally administered by an       anesthesia professional. The following parameters were monitored: oxygen       saturation, heart rate, blood pressure, and response to care. Recommendation:           - Patient has a contact number available for                            emergencies. The signs and symptoms of potential                            delayed complications were discussed with the                            patient. Return to normal activities tomorrow.                            Written discharge instructions were provided to the                            patient.                           - Resume previous diet.                           - Continue present medications.                           - Await pathology results.                           -  Use Protonix (pantoprazole) 40 mg PO BID for 3                            months.                           - Use sucralfate tablets 1 gram PO BID for 2 weeks. Procedure Code(s):        --- Professional ---                           [phone], Esophagogastroduodenoscopy, flexible,                            transoral; with transendoscopic balloon dilation of                            esophagus (less than 30 mm diameter)                           43239, 59, Esophagogastroduodenoscopy, flexible,                            transoral; with biopsy, single or multiple Diagnosis Code(s):        --- Professional ---                           K22.2, Esophageal obstruction                           K22.10, Ulcer of esophagus without bleeding                           K29.50, Unspecified chronic gastritis without                            bleeding                           R13.10, Dysphagia, unspecified                           R93.3, Abnormal findings on diagnostic imaging of                             other parts of digestive tract CPT copyright 2019 [employer]. All rights reserved. The codes documented in this report are preliminary and upon coder review may  be revised to meet current compliance requirements. Kathi Der, MD Kathi Der, MD 08/13/2021 8:20:53 AM Number of Addenda: 0

## 2021-08-13 NOTE — H&P (Signed)
Primary Care Physician:  Kaleen Mask, MD Primary Gastroenterologist:  [facility]  Reason for Visit : Outpatient EGD with possible dilation  HPI: Carol Snyder is a 87 y.o. female with past medical history of permanent atrial fibrillation, history of sick sinus syndrome status post pacemaker placement, history of CHF, stroke currently on anticoagulation was seen in the office for dysphagia.  Barium swallow x2 showed distal esophageal stricture.  Patient with some weight loss.  Occasional spitting of saliva regularly in the morning.  After long discussion, they agreed to proceed with EGD with dilation.  Past Medical History:  Diagnosis Date   Anemia    Aortic insufficiency    Chest pain    Chronic diarrhea    DNR (do not resuscitate)    Dysrhythmia    afib   Falling    History of GI bleed    History of shingles    Long term current use of anticoagulant    Mitral regurgitation    Orthostatic hypotension    Persistent atrial fibrillation (HCC)    on Coumadin   Pleural effusion    Sinus node dysfunction (HCC)    a. s/p Biotronik PPM.   Stroke (HCC)    Tricuspid regurgitation     Past Surgical History:  Procedure Laterality Date   APPENDECTOMY     CHOLECYSTECTOMY     with partial colectomy do to gallbadder infection    IR THORACENTESIS ASP PLEURAL SPACE W/IMG GUIDE  06/22/2018   IR THORACENTESIS ASP PLEURAL SPACE W/IMG GUIDE  09/29/2018   IR THORACENTESIS ASP PLEURAL SPACE W/IMG GUIDE  07/05/2019   PACEMAKER IMPLANT  06/04/2016   BTK Edora 8 DR-T implanted by Dr Rudolpho Sevin at [facility] for sick sinus syndrome    Prior to Admission medications   Medication Sig Start Date End Date Taking? Authorizing Provider  acetaminophen (TYLENOL) 500 MG tablet Take 1,000 mg by mouth in the morning and at bedtime.   Yes [provider]  alendronate (FOSAMAX) 70 MG tablet Take 70 mg by mouth once a week. Sunday   Yes [provider]  Camphor-Menthol-Methyl Sal  (SALONPAS) 3.11-22-08 % PTCH Apply 1 patch topically daily as needed (pain).   Yes [provider]  cholecalciferol (VITAMIN D3) 25 MCG (1000 UNIT) tablet Take 1,000 Units by mouth daily.   Yes [provider]  digoxin (LANOXIN) 0.125 MG tablet Take 0.5 tablets (0.0625 mg total) by mouth daily. 02/06/21  Yes Bhagat, Bhavinkumar, PA  enoxaparin (LOVENOX) 40 MG/0.4ML injection Inject 40 mg into the skin daily.   Yes [provider]  fluticasone (FLONASE) 50 MCG/ACT nasal spray Place 1 spray into both nostrils daily as needed for allergies. 08/22/20  Yes [provider]  furosemide (LASIX) 80 MG tablet Take 120 mg by mouth daily.   Yes [provider]  furosemide (LASIX) 80 MG tablet Take 1 tablet (80 mg total) by mouth daily. 02/06/21  Yes Bhagat, Bhavinkumar, PA  hydroxypropyl methylcellulose / hypromellose (ISOPTO TEARS / GONIOVISC) 2.5 % ophthalmic solution Place 1 drop into both eyes 3 (three) times daily as needed for dry eyes.   Yes [provider]  loperamide (IMODIUM) 2 MG capsule Take 4 mg by mouth daily.   Yes [provider]  loratadine (CLARITIN) 10 MG tablet Take 10 mg by mouth daily.   Yes [provider]  Menthol, Topical Analgesic, (BIOFREEZE EX) Apply 1 application topically daily as needed (pain).   Yes [provider]  metoprolol succinate (TOPROL-XL) 100 MG 24 hr tablet Take 1 tablet (100 mg total) by mouth daily. Take with or immediately following a meal. 02/06/21  Yes Bhagat, Bhavinkumar, PA  omeprazole (PRILOSEC) 20 MG capsule Take 20 mg by mouth daily.   Yes [provider]  potassium chloride SA (KLOR-CON M20) 20 MEQ tablet Take 1 tablet (20 mEq total) by mouth daily. 02/06/21  Yes Bhagat, Bhavinkumar, PA  warfarin (COUMADIN) 5 MG tablet Take 2.5 mg by mouth daily.   Yes [provider]  cholestyramine (QUESTRAN) 4 g packet Take 2 g by mouth daily as needed (Diarrhea). Half pack daily     [provider]  nitroGLYCERIN (NITROSTAT) 0.4 MG SL tablet Place 1 tablet (0.4 mg total) under the tongue every 5 (five) minutes as needed for chest pain. 09/12/19   Georgie Chard D, NP    Scheduled Meds: Continuous Infusions:  sodium chloride     lactated ringers 1,000 mL (08/13/21 0711)   PRN Meds:.  Allergies as of 07/29/2021 - Review Complete 07/11/2021  Allergen Reaction Noted   Codeine Other (See Comments) 06/20/2018   Enbrel [etanercept] Other (See Comments) 06/20/2018   Fosamax [alendronate sodium] Other (See Comments) 06/20/2018   Hydromorphone Other (See Comments) 06/20/2018   Motrin [ibuprofen] Other (See Comments) 06/20/2018   Sulfa antibiotics Other (See Comments) 06/27/2011   Voltaren [diclofenac] Other (See Comments) 06/20/2018   Naproxen sodium Palpitations 06/27/2011    Family History  Problem Relation Age of Onset   Heart attack Father    Hypertension Father    Stroke Mother    Cancer Brother        lung   Arrhythmia Daughter        heart palpitations    Social History   Socioeconomic History   Marital status: Single    Spouse name: Not on file   Number of children: Not on file   Years of education: Not on file   Highest education level: Not on file  Occupational History   Not on file  Tobacco Use   Smoking status: Former    Types: Cigarettes    Quit date: 11/17/2004    Years since quitting: 16.7   Smokeless tobacco: Never  Vaping Use   Vaping Use: Never used  Substance and Sexual Activity   Alcohol use: No   Drug use: No   Sexual activity: Not on file  Other Topics Concern   Not on file  Social History Narrative   Not on file   Social Determinants of Health   Financial Resource Strain: Not on file  Food Insecurity: Not on file  Transportation Needs: Not on file  Physical Activity: Not on file  Stress: Not on file  Social Connections: Not on file  Intimate Partner Violence: Not on file      Physical Exam: Vital  signs: Vitals:   08/13/21 0646  BP: (!) 103/54  Pulse: 68  Resp: 17  Temp: 97.7 F (36.5 C)  SpO2: 95%     General:   Elderly patient, not in acute distress Lungs: No visible respiratory distress Heart: Irregularly irregular rate Abdomen: Soft, nontender, nondistended, bowel sounds present. Rectal:  Deferred  GI:  Lab Results: No results for input(s): WBC, HGB, HCT, PLT in the last 72 hours. BMET No results for input(s): NA, K, CL, CO2, GLUCOSE, BUN, CREATININE, CALCIUM in the last 72 hours. LFT No results for input(s): PROT, ALBUMIN, AST, ALT, ALKPHOS, BILITOT, BILIDIR, IBILI in the last 72  hours. PT/INR No results for input(s): LABPROT, INR in the last 72 hours.   Studies/Results: No results found.  Impression/Plan: -Dysphagia with abnormal barium swallow concerning for filling defect in the distal esophagus -Permanent atrial fibrillation.  Was on Lovenox bridge.  Last dose on Sunday. -Multiple other medical comorbidities  Recommendation ----------------------- -Proceed with EGD with possible dilation -Prior risk of complication because of advanced age and multiple comorbidities discussed with the patient and patient's family.  Risks (bleeding, infection, bowel perforation that could require surgery, sedation-related changes in cardiopulmonary systems), benefits (identification and possible treatment of source of symptoms, exclusion of certain causes of symptoms), and alternatives (watchful waiting, radiographic imaging studies, empiric medical treatment)  were explained to patient/family in detail and patient wishes to proceed.       LOS: 0 days   Kathi Der  MD, FACP 08/13/2021, 7:49 AM  Contact #  [phone]

## 2021-08-13 NOTE — Discharge Instructions (Signed)

## 2021-08-14 ENCOUNTER — Encounter (HOSPITAL_COMMUNITY): Payer: Self-pay | Admitting: Gastroenterology

## 2021-08-14 LAB — SURGICAL PATHOLOGY

## 2021-08-26 ENCOUNTER — Other Ambulatory Visit: Payer: Self-pay | Admitting: Family Medicine

## 2021-08-26 ENCOUNTER — Other Ambulatory Visit (HOSPITAL_COMMUNITY): Payer: Self-pay | Admitting: Family Medicine

## 2021-08-26 DIAGNOSIS — R0989 Other specified symptoms and signs involving the circulatory and respiratory systems: Secondary | ICD-10-CM

## 2021-08-29 ENCOUNTER — Telehealth: Payer: Self-pay | Admitting: Cardiology

## 2021-08-29 NOTE — Telephone Encounter (Signed)
Pt c/o medication issue:  1. Name of Medication: metoprolol succinate (TOPROL-XL) 100 MG 24 hr tablet  2. How are you currently taking this medication (dosage and times per day)?   3. Are you having a reaction (difficulty breathing--STAT)?   4. What is your medication issue?   Patient's daughter states patient's PCP reduced her metoprolol from 100 MG to 50 MG due to kidney dysfunction.

## 2021-08-30 NOTE — Telephone Encounter (Signed)
Spoke with daughter who reports Dr Jeannetta Nap decreased her Metoprolol from 100 to 50 mg a day d/t for recent lab (decreased renal function) at her last office visit.   Daughter reports Dr Shelah Lewandowsky had stated he would send information to Dr Anne Fu for his knowledge.  Advised at this point, we have not received anything from his office. Called and spoke with his office who reports per Dr Jeannetta Nap note - nephrologist at [facility] advised to decrease Metoprolol to 50 mg daily.  Last lab work there was completed 10/6 including INR and BMP.  Results being faxed to our office.

## 2021-09-07 ENCOUNTER — Ambulatory Visit
Admission: RE | Admit: 2021-09-07 | Discharge: 2021-09-07 | Disposition: A | Discharge status: Home / Self Care | Payer: Medicare PPO | Source: Ambulatory Visit | Attending: Family Medicine | Admitting: Family Medicine

## 2021-09-07 ENCOUNTER — Other Ambulatory Visit: Payer: Self-pay

## 2021-09-07 DIAGNOSIS — R0989 Other specified symptoms and signs involving the circulatory and respiratory systems: Secondary | ICD-10-CM

## 2021-09-18 ENCOUNTER — Institutional Professional Consult (permissible substitution): Payer: Medicare PPO | Admitting: Pulmonary Disease

## 2021-10-14 ENCOUNTER — Telehealth: Payer: Self-pay

## 2021-10-14 NOTE — Telephone Encounter (Signed)
Spoke with patient daughter. She reports pt was putting up [employer] yesterday so she was more active than usual.  She indicates patient did not complain of any cardiac symptoms.    She also notes that Nephrology decreased her Metoprolol.  She will be seeing the MD on Wednesday.    Advised we will continue to monitor, if increased HVR episodes continue we may need to see patient.

## 2021-10-14 NOTE — Telephone Encounter (Signed)
Biotronik alert received- increased HVR episodes 74 recorded in last 24 hours.  This does not appear to be known issue for patient.  Attempted to reach patient at number on record.  No answer, # on record is same as EC.  LVM requesting callback with device clinic # and hours.

## 2021-10-14 NOTE — Telephone Encounter (Signed)
Carol Snyder is returning Amy's call.

## 2021-10-17 ENCOUNTER — Ambulatory Visit (INDEPENDENT_AMBULATORY_CARE_PROVIDER_SITE_OTHER): Payer: Medicare PPO

## 2021-10-17 DIAGNOSIS — I255 Ischemic cardiomyopathy: Secondary | ICD-10-CM

## 2021-10-17 LAB — CUP PACEART REMOTE DEVICE CHECK
Battery Remaining Percentage: 65 %
Brady Statistic RV Percent Paced: 10 %
Date Time Interrogation Session: 20221201093842
Implantable Lead Implant Date: 20170719
Implantable Lead Implant Date: 20170719
Implantable Lead Location: 753859
Implantable Lead Location: 753860
Implantable Lead Model: 377
Implantable Lead Model: 377
Implantable Lead Serial Number: 49475245
Implantable Lead Serial Number: 49500032
Implantable Pulse Generator Implant Date: 20170719
Lead Channel Impedance Value: 468 Ohm
Lead Channel Pacing Threshold Amplitude: 0.9 V
Lead Channel Pacing Threshold Pulse Width: 0.4 ms
Lead Channel Sensing Intrinsic Amplitude: 8 mV
Lead Channel Setting Pacing Amplitude: 2.4 V
Lead Channel Setting Pacing Pulse Width: 0.4 ms
Pulse Gen Model: 407145
Pulse Gen Serial Number: 68814684

## 2021-10-28 NOTE — Progress Notes (Signed)
Remote pacemaker transmission.   

## 2022-01-16 ENCOUNTER — Ambulatory Visit (INDEPENDENT_AMBULATORY_CARE_PROVIDER_SITE_OTHER): Payer: Medicare PPO

## 2022-01-16 DIAGNOSIS — I255 Ischemic cardiomyopathy: Secondary | ICD-10-CM | POA: Diagnosis not present

## 2022-01-17 LAB — CUP PACEART REMOTE DEVICE CHECK
Date Time Interrogation Session: 20230302101135
Implantable Lead Implant Date: 20170719
Implantable Lead Implant Date: 20170719
Implantable Lead Location: 753859
Implantable Lead Location: 753860
Implantable Lead Model: 377
Implantable Lead Model: 377
Implantable Lead Serial Number: 49475245
Implantable Lead Serial Number: 49500032
Implantable Pulse Generator Implant Date: 20170719
Pulse Gen Model: 407145
Pulse Gen Serial Number: 68814684

## 2022-01-23 NOTE — Progress Notes (Signed)
Remote pacemaker transmission.   

## 2022-02-20 ENCOUNTER — Other Ambulatory Visit: Payer: Self-pay | Admitting: Physician Assistant

## 2022-02-21 ENCOUNTER — Other Ambulatory Visit: Payer: Self-pay | Admitting: Physician Assistant

## 2022-03-15 ENCOUNTER — Other Ambulatory Visit: Payer: Self-pay | Admitting: Cardiology

## 2022-04-11 ENCOUNTER — Other Ambulatory Visit: Payer: Self-pay

## 2022-04-11 ENCOUNTER — Emergency Department (HOSPITAL_COMMUNITY)
Admission: EM | Admit: 2022-04-11 | Discharge: 2022-04-11 | Disposition: A | Discharge status: Home / Self Care | Payer: Medicare PPO | Attending: Emergency Medicine | Admitting: Emergency Medicine

## 2022-04-11 ENCOUNTER — Encounter (HOSPITAL_COMMUNITY): Payer: Self-pay | Admitting: Emergency Medicine

## 2022-04-11 ENCOUNTER — Emergency Department (HOSPITAL_COMMUNITY): Payer: Medicare PPO

## 2022-04-11 DIAGNOSIS — N179 Acute kidney failure, unspecified: Secondary | ICD-10-CM | POA: Diagnosis not present

## 2022-04-11 DIAGNOSIS — R000 Tachycardia, unspecified: Secondary | ICD-10-CM | POA: Diagnosis not present

## 2022-04-11 DIAGNOSIS — R42 Dizziness and giddiness: Secondary | ICD-10-CM | POA: Insufficient documentation

## 2022-04-11 DIAGNOSIS — R195 Other fecal abnormalities: Secondary | ICD-10-CM | POA: Insufficient documentation

## 2022-04-11 DIAGNOSIS — R55 Syncope and collapse: Secondary | ICD-10-CM | POA: Insufficient documentation

## 2022-04-11 DIAGNOSIS — R5383 Other fatigue: Secondary | ICD-10-CM | POA: Insufficient documentation

## 2022-04-11 DIAGNOSIS — I951 Orthostatic hypotension: Secondary | ICD-10-CM

## 2022-04-11 LAB — COMPREHENSIVE METABOLIC PANEL
ALT: 9 U/L (ref 0–44)
AST: 17 U/L (ref 15–41)
Albumin: 3.7 g/dL (ref 3.5–5.0)
Alkaline Phosphatase: 40 U/L (ref 38–126)
Anion gap: 9 (ref 5–15)
BUN: 33 mg/dL — ABNORMAL HIGH (ref 8–23)
CO2: 24 mmol/L (ref 22–32)
Calcium: 8.3 mg/dL — ABNORMAL LOW (ref 8.9–10.3)
Chloride: 101 mmol/L (ref 98–111)
Creatinine, Ser: 1.85 mg/dL — ABNORMAL HIGH (ref 0.44–1.00)
GFR, Estimated: 26 mL/min — ABNORMAL LOW (ref >60)
Glucose, Bld: 108 mg/dL — ABNORMAL HIGH (ref 70–99)
Potassium: 3.5 mmol/L (ref 3.5–5.1)
Sodium: 134 mmol/L — ABNORMAL LOW (ref 135–145)
Total Bilirubin: 0.7 mg/dL (ref 0.3–1.2)
Total Protein: 6.8 g/dL (ref 6.5–8.1)

## 2022-04-11 LAB — CBC WITH DIFFERENTIAL/PLATELET
Abs Immature Granulocytes: 0.03 10*3/uL (ref 0.00–0.07)
Basophils Absolute: 0.1 10*3/uL (ref 0.0–0.1)
Basophils Relative: 1 %
Eosinophils Absolute: 0.5 10*3/uL (ref 0.0–0.5)
Eosinophils Relative: 7 %
HCT: 33.7 % — ABNORMAL LOW (ref 36.0–46.0)
Hemoglobin: 10.1 g/dL — ABNORMAL LOW (ref 12.0–15.0)
Immature Granulocytes: 0 %
Lymphocytes Relative: 17 %
Lymphs Abs: 1.2 10*3/uL (ref 0.7–4.0)
MCH: 28.8 pg (ref 26.0–34.0)
MCHC: 30 g/dL (ref 30.0–36.0)
MCV: 96 fL (ref 80.0–100.0)
Monocytes Absolute: 0.6 10*3/uL (ref 0.1–1.0)
Monocytes Relative: 9 %
Neutro Abs: 4.5 10*3/uL (ref 1.7–7.7)
Neutrophils Relative %: 66 %
Platelets: 160 10*3/uL (ref 150–400)
RBC: 3.51 MIL/uL — ABNORMAL LOW (ref 3.87–5.11)
RDW: 16.7 % — ABNORMAL HIGH (ref 11.5–15.5)
WBC: 6.9 10*3/uL (ref 4.0–10.5)
nRBC: 0 % (ref 0.0–0.2)

## 2022-04-11 LAB — PROTIME-INR
INR: 2 — ABNORMAL HIGH (ref 0.8–1.2)
Prothrombin Time: 22.4 seconds — ABNORMAL HIGH (ref 11.4–15.2)

## 2022-04-11 LAB — DIGOXIN LEVEL: Digoxin Level: 0.4 ng/mL — ABNORMAL LOW (ref 0.8–2.0)

## 2022-04-11 MED ORDER — SODIUM CHLORIDE 0.9 % IV BOLUS
1000.0000 mL | Freq: Once | INTRAVENOUS | Status: AC
  Administered 2022-04-11: 1000 mL via INTRAVENOUS

## 2022-04-11 MED ORDER — METOPROLOL TARTRATE 25 MG PO TABS
100.0000 mg | ORAL_TABLET | Freq: Once | ORAL | Status: AC
  Administered 2022-04-11: 100 mg via ORAL
  Filled 2022-04-11: qty 4

## 2022-04-11 NOTE — ED Provider Notes (Signed)
[facility] EMERGENCY DEPARTMENT Provider Note   CSN: [id number] Arrival date & time: 04/11/22  1322     History  Chief Complaint  Patient presents with   Dizziness    Carol Snyder is a 88 y.o. female.  88 yo F with a chief complaints of syncopal event.  The patient tells me that she was just in the hospital at [facility] and she was discharged today.  She had had a right-sided pleural effusion that was drained.  She tells me that she has been fatigued when she went in and feels little bit better.  When she got home she had a loose stool and every time she stood up she felt lightheaded.  Per EMS they felt like she was going into the high rates of A-fib with exertion and had had a syncopal event.  She denied any chest pain denied cough denied fever denied dark stool or blood in her stool.  Denied abdominal pain.  Had reportedly not taken her rate controlling agent this morning.       Home Medications Prior to Admission medications   Medication Sig Start Date End Date Taking? Authorizing Provider  acetaminophen (TYLENOL) 500 MG tablet Take 1,000 mg by mouth in the morning and at bedtime.    [provider]  alendronate (FOSAMAX) 70 MG tablet Take 70 mg by mouth once a week. Sunday    [provider]  Camphor-Menthol-Methyl Sal (SALONPAS) 3.11-22-08 % PTCH Apply 1 patch topically daily as needed (pain).    [provider]  cholecalciferol (VITAMIN D3) 25 MCG (1000 UNIT) tablet Take 1,000 Units by mouth daily.    [provider]  cholestyramine (QUESTRAN) 4 g packet Take 2 g by mouth daily as needed (Diarrhea). Half pack daily    [provider]  digoxin (LANOXIN) 0.125 MG tablet Take 0.5 tablets (62.5 mcg total) by mouth daily. 03/17/22   Jake Bathe, MD  enoxaparin (LOVENOX) 40 MG/0.4ML injection Inject 40 mg into the skin daily.    [provider]  fluticasone (FLONASE) 50 MCG/ACT nasal spray Place 1 spray  into both nostrils daily as needed for allergies. 08/22/20   [provider]  furosemide (LASIX) 80 MG tablet Take 120 mg by mouth daily.    [provider]  furosemide (LASIX) 80 MG tablet Take 1 tablet (80 mg total) by mouth daily. 02/06/21   Bhagat, Sharrell Ku, PA  hydroxypropyl methylcellulose / hypromellose (ISOPTO TEARS / GONIOVISC) 2.5 % ophthalmic solution Place 1 drop into both eyes 3 (three) times daily as needed for dry eyes.    [provider]  loperamide (IMODIUM) 2 MG capsule Take 4 mg by mouth daily.    [provider]  loratadine (CLARITIN) 10 MG tablet Take 10 mg by mouth daily.    [provider]  Menthol, Topical Analgesic, (BIOFREEZE EX) Apply 1 application topically daily as needed (pain).    [provider]  metoprolol succinate (TOPROL-XL) 100 MG 24 hr tablet Take 1 tablet (100 mg total) by mouth daily. Please schedule an appt for future refills. 1st attempt 02/20/22   Jake Bathe, MD  nitroGLYCERIN (NITROSTAT) 0.4 MG SL tablet Place 1 tablet (0.4 mg total) under the tongue every 5 (five) minutes as needed for chest pain. 09/12/19   Filbert Schilder, NP  pantoprazole (PROTONIX) 40 MG tablet Take 1 tablet (40 mg total) by mouth 2 (two) times daily. 08/13/21 08/13/22  Kathi Der, MD  potassium chloride SA (KLOR-CON M20) 20 MEQ tablet Take 1 tablet (20 mEq total) by mouth daily. Please schedule aoor for future refills. 1st attempt 02/20/22   Jake BatheSkains, Mark C, MD  sucralfate (CARAFATE) 1 GM/10ML suspension Take 10 mLs (1 g total) by mouth 2 (two) times daily. 08/13/21 08/13/22  Kathi DerBrahmbhatt, Parag, MD  warfarin (COUMADIN) 5 MG tablet Take 2.5 mg by mouth daily.    [provider]      Allergies    Codeine, Enbrel [etanercept], Hydromorphone, Motrin [ibuprofen], Sulfa antibiotics, Voltaren [diclofenac], and Naproxen sodium    Review of Systems   Review of Systems  Physical Exam Updated Vital Signs BP 91/78   Pulse  (!) 109   Temp (!) 97.5 F (36.4 C) (Oral)   Resp 15   Ht 5\' 7"  (1.702 m)   Wt 40.8 kg   SpO2 99%   BMI 14.10 kg/m  Physical Exam Vitals and nursing note reviewed.  Constitutional:      General: She is not in acute distress.    Appearance: She is well-developed. She is not diaphoretic.     Comments: Cachectic  HENT:     Head: Normocephalic and atraumatic.  Eyes:     Pupils: Pupils are equal, round, and reactive to light.  Cardiovascular:     Rate and Rhythm: Tachycardia present. Rhythm irregular.     Heart sounds: No murmur heard.   No friction rub. No gallop.  Pulmonary:     Effort: Pulmonary effort is normal.     Breath sounds: No wheezing or rales.  Abdominal:     General: There is no distension.     Palpations: Abdomen is soft.     Tenderness: There is no abdominal tenderness.  Musculoskeletal:        General: No tenderness.     Cervical back: Normal range of motion and neck supple.  Skin:    General: Skin is warm and dry.  Neurological:     Mental Status: She is alert and oriented to person, place, and time.  Psychiatric:        Behavior: Behavior normal.    ED Results / Procedures / Treatments   Labs (all labs ordered are listed, but only abnormal results are displayed) Labs Reviewed  CBC WITH DIFFERENTIAL/PLATELET - Abnormal; Notable for the following components:      Result Value   RBC 3.51 (*)    Hemoglobin 10.1 (*)    HCT 33.7 (*)    RDW 16.7 (*)    All other components within normal limits  COMPREHENSIVE METABOLIC PANEL - Abnormal; Notable for the following components:   Sodium 134 (*)    Glucose, Bld 108 (*)    BUN 33 (*)    Creatinine, Ser 1.85 (*)    Calcium 8.3 (*)    GFR, Estimated 26 (*)    All other components within normal limits  PROTIME-INR - Abnormal; Notable for the following components:   Prothrombin Time 22.4 (*)    INR 2.0 (*)    All other components within normal limits  DIGOXIN LEVEL    EKG EKG  Interpretation  Date/Time:  Friday Apr 11 2022 13:40:02 EDT Ventricular Rate:  112 PR Interval:    QRS Duration: 94 QT Interval:  328 QTC Calculation: 448 R Axis:   52 Text Interpretation: Atrial fibrillation Ventricular premature complex Borderline repolarization abnormality No significant change since last tracing Confirmed by Melene PlanFloyd, Azeneth Carbonell [phone]([id number]) on 04/11/2022 2:08:06 PM  Radiology DG [facility]  1 View  Result Date: 04/11/2022 CLINICAL DATA:  Syncope. EXAM: PORTABLE CHEST 1 VIEW COMPARISON:  Chest radiograph dated Apr 10, 2022 FINDINGS: The heart is enlarged. Atherosclerotic calcification of the aortic arch. Left access pacemaker with leads in the right atrium and right ventricle, unchanged. Lungs are clear without evidence of focal consolidation or pleural effusion. Biapical pleural/parenchymal scarring, unchanged. IMPRESSION: Stable cardiomegaly without evidence of acute cardiopulmonary process. Electronically Signed   By: Larose Hires D.O.   On: 04/11/2022 14:25    Procedures Procedures    Medications Ordered in ED Medications  sodium chloride 0.9 % bolus 1,000 mL (1,000 mLs Intravenous New Bag/Given 04/11/22 1357)  metoprolol tartrate (LOPRESSOR) tablet 100 mg (100 mg Oral Given 04/11/22 1357)    ED Course/ Medical Decision Making/ A&P                           Medical Decision Making Amount and/or Complexity of Data Reviewed Labs: ordered. Radiology: ordered. ECG/medicine tests: ordered.  Risk Prescription drug management.   88 yo F with a chief complaints of a syncopal event.  This occurred just prior to arrival.  The patient was just in the hospital at [facility]  She was found then to have pleural effusion and had some symptomatic improvement and was discharged home.  Patient having rates into the 140s with just sitting up.  We will give a dose of her home rate controlling agent metoprolol.  Bolus of IV fluids.  Laboratory evaluation and interrogate pacemaker  reassess.  Patient with a mild bump in her renal dysfunction and went from 1.2 yesterday to 1.8 today.  No significant anemia.  No significant electrolyte abnormality.  Dig level was canceled reordered.  Awaiting results.  Awaiting pacemaker interrogation.  Patient care was signed out to Dr. Particia Nearing, please see their note for further details care in the ED.  The patients results and plan were reviewed and discussed.   Any x-rays performed were independently reviewed by myself.   Differential diagnosis were considered with the presenting HPI.  Medications  sodium chloride 0.9 % bolus 1,000 mL (1,000 mLs Intravenous New Bag/Given 04/11/22 1357)  metoprolol tartrate (LOPRESSOR) tablet 100 mg (100 mg Oral Given 04/11/22 1357)    Vitals:   04/11/22 1349 04/11/22 1405 04/11/22 1430 04/11/22 1445  BP: 95/83 108/83 95/68 91/78   Pulse: 93 93 (!) 102 (!) 109  Resp:  (!) 25 (!) 21 15  Temp: (!) 97.5 F (36.4 C)     TempSrc: Oral     SpO2: 100% 98% 99%   Weight:      Height:        Final diagnoses:  Syncope and collapse  AKI (acute kidney injury) (HCC)           Final Clinical Impression(s) / ED Diagnoses Final diagnoses:  Syncope and collapse  AKI (acute kidney injury) [facility])    Rx / DC Orders ED Discharge Orders     None         Melene Plan, DO 04/11/22 1522

## 2022-04-11 NOTE — Discharge Instructions (Signed)
Decrease lasix dose down to 80 mg.

## 2022-04-11 NOTE — ED Notes (Signed)
Brett from [place] en route to interrogate pacemaker. EDP aware.

## 2022-04-11 NOTE — ED Provider Notes (Signed)
Pt signed out by Dr. Adela Lank pending labs, pacemaker interrogation, and improvement.  HR is back to normal.  Bp has improved.  Pacemaker has been interrogated.  She had no arrhythmia during her syncopal event.  Pt is able to ambulate with a walker.  She has no dizziness.  Pt is on a huge dose of lasix (120 mg daily).  Her daughter said she had to be on that high of a dose because her legs were weeping. Pt has very thin legs now without any edema.  I will have the patient decrease her dose to 80 mg and increase fluid intake.  Pt is stable for d/c.  Return if worse.      Jacalyn Lefevre, MD 04/11/22 2145

## 2022-04-11 NOTE — ED Triage Notes (Signed)
Pt BIB [facility] from home due to dizziness.  Pt states that she "passed out" while walking in her home.  She also endorses having diarrhea.  Pt currently does not have any pain and has not taken any of her medications today.  A-fib has been fluctuating from 80-130.  VS BP 92/60, SpO2 96%.  18 g right forearm.  557ml bolus given en route.  She was discharged from [facility] 04/09/22 for same pt states.

## 2022-04-11 NOTE — ED Notes (Signed)
Ambulated pt with walker, pt had slow but steady gait. No complaints of dizziness or lightheadedness

## 2022-04-11 NOTE — ED Notes (Signed)
Genelle Bal from [place] at bedside interrogating pacemaker at this time

## 2022-04-17 ENCOUNTER — Ambulatory Visit (INDEPENDENT_AMBULATORY_CARE_PROVIDER_SITE_OTHER): Payer: Medicare PPO

## 2022-04-17 DIAGNOSIS — I255 Ischemic cardiomyopathy: Secondary | ICD-10-CM

## 2022-04-17 LAB — CUP PACEART REMOTE DEVICE CHECK
Battery Remaining Percentage: 60 %
Brady Statistic RV Percent Paced: 1 %
Date Time Interrogation Session: 20230601132218
Implantable Lead Implant Date: 20170719
Implantable Lead Implant Date: 20170719
Implantable Lead Location: 753859
Implantable Lead Location: 753860
Implantable Lead Model: 377
Implantable Lead Model: 377
Implantable Lead Serial Number: 49475245
Implantable Lead Serial Number: 49500032
Implantable Pulse Generator Implant Date: 20170719
Lead Channel Impedance Value: 468 Ohm
Lead Channel Pacing Threshold Amplitude: 0.9 V
Lead Channel Pacing Threshold Pulse Width: 0.4 ms
Lead Channel Sensing Intrinsic Amplitude: 8.2 mV
Lead Channel Setting Pacing Amplitude: 2.4 V
Lead Channel Setting Pacing Pulse Width: 0.4 ms
Pulse Gen Model: 407145
Pulse Gen Serial Number: 68814684

## 2022-04-21 ENCOUNTER — Other Ambulatory Visit: Payer: Self-pay | Admitting: Cardiology

## 2022-04-25 NOTE — Progress Notes (Signed)
Remote pacemaker transmission.   

## 2022-05-23 ENCOUNTER — Other Ambulatory Visit: Payer: Self-pay | Admitting: Cardiology

## 2022-05-28 IMAGING — DX DG CHEST 1V PORT
1 series · 1 of 1 positions shown · non-contrast
Comparison: Chest radiograph dated April 10, 2022

CLINICAL DATA: Syncope.

EXAM:
PORTABLE CHEST 1 VIEW

[chest]
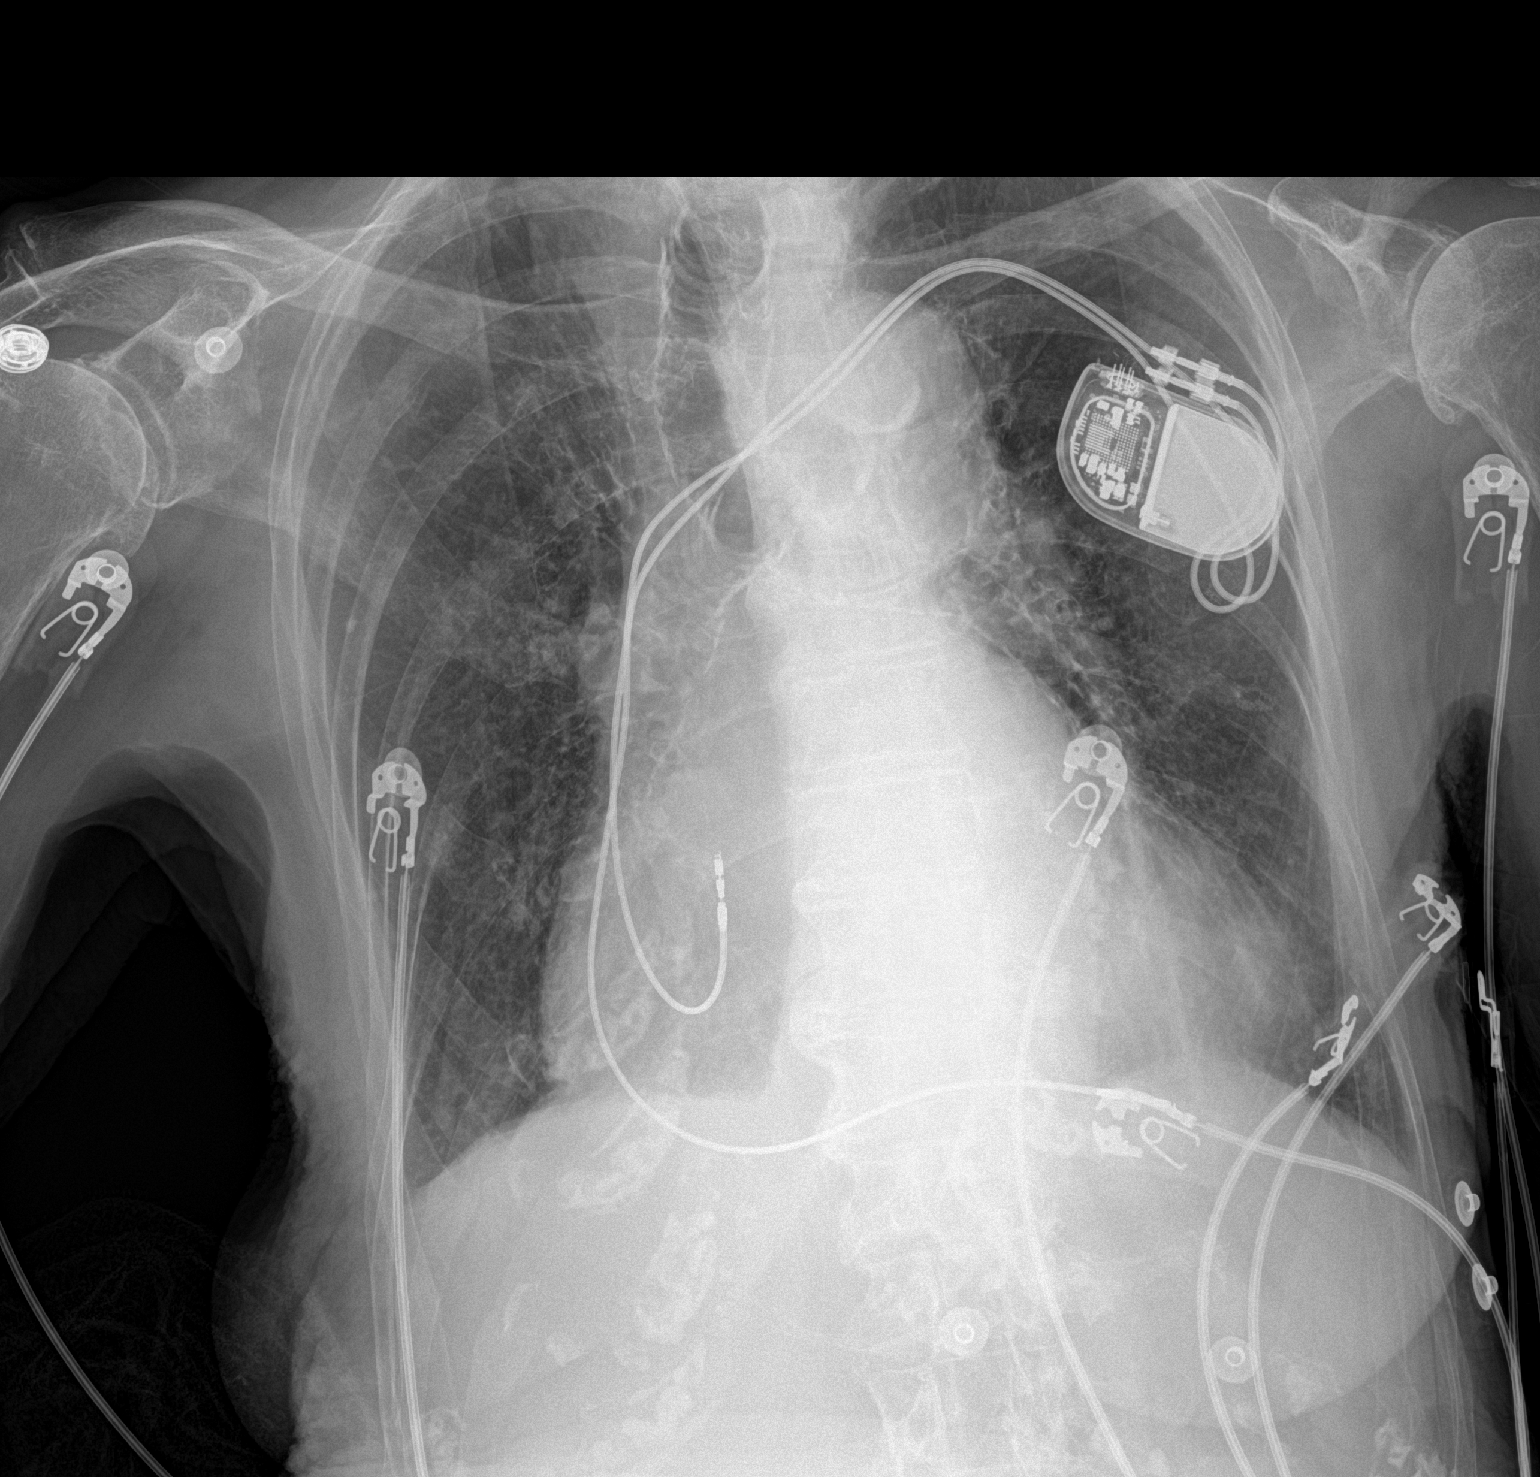

[1 of 1 positions shown; findings below may reference images not displayed]

FINDINGS: The heart is enlarged. Atherosclerotic calcification of the aortic
arch. Left access pacemaker with leads in the right atrium and right
ventricle, unchanged. Lungs are clear without evidence of focal
consolidation or pleural effusion. Biapical pleural/parenchymal
scarring, unchanged.
IMPRESSION: Stable cardiomegaly without evidence of acute cardiopulmonary
process.

## 2022-06-08 ENCOUNTER — Other Ambulatory Visit: Payer: Self-pay | Admitting: Cardiology

## 2022-06-09 MED ORDER — POTASSIUM CHLORIDE CRYS ER 20 MEQ PO TBCR
20.0000 meq | EXTENDED_RELEASE_TABLET | Freq: Every day | ORAL | 0 refills | Status: DC
Start: 2022-06-09 — End: 2022-08-05

## 2022-06-13 ENCOUNTER — Emergency Department (HOSPITAL_COMMUNITY): Payer: Medicare PPO

## 2022-06-13 ENCOUNTER — Inpatient Hospital Stay (HOSPITAL_COMMUNITY)
Admission: EM | Admit: 2022-06-13 | Discharge: 2022-06-17 | DRG: 312 | Disposition: A | Discharge status: Home Health | Payer: Medicare PPO | Attending: Internal Medicine | Admitting: Internal Medicine

## 2022-06-13 DIAGNOSIS — I4819 Other persistent atrial fibrillation: Secondary | ICD-10-CM | POA: Diagnosis present

## 2022-06-13 DIAGNOSIS — Z681 Body mass index (BMI) 19 or less, adult: Secondary | ICD-10-CM

## 2022-06-13 DIAGNOSIS — Z79899 Other long term (current) drug therapy: Secondary | ICD-10-CM

## 2022-06-13 DIAGNOSIS — N179 Acute kidney failure, unspecified: Secondary | ICD-10-CM

## 2022-06-13 DIAGNOSIS — Z96642 Presence of left artificial hip joint: Secondary | ICD-10-CM | POA: Diagnosis present

## 2022-06-13 DIAGNOSIS — M533 Sacrococcygeal disorders, not elsewhere classified: Secondary | ICD-10-CM

## 2022-06-13 DIAGNOSIS — R000 Tachycardia, unspecified: Secondary | ICD-10-CM | POA: Diagnosis not present

## 2022-06-13 DIAGNOSIS — I2510 Atherosclerotic heart disease of native coronary artery without angina pectoris: Secondary | ICD-10-CM | POA: Diagnosis present

## 2022-06-13 DIAGNOSIS — R636 Underweight: Secondary | ICD-10-CM | POA: Diagnosis present

## 2022-06-13 DIAGNOSIS — Z882 Allergy status to sulfonamides status: Secondary | ICD-10-CM

## 2022-06-13 DIAGNOSIS — Z886 Allergy status to analgesic agent status: Secondary | ICD-10-CM

## 2022-06-13 DIAGNOSIS — Z8619 Personal history of other infectious and parasitic diseases: Secondary | ICD-10-CM

## 2022-06-13 DIAGNOSIS — I5022 Chronic systolic (congestive) heart failure: Secondary | ICD-10-CM

## 2022-06-13 DIAGNOSIS — R55 Syncope and collapse: Secondary | ICD-10-CM | POA: Diagnosis not present

## 2022-06-13 DIAGNOSIS — I951 Orthostatic hypotension: Secondary | ICD-10-CM | POA: Diagnosis not present

## 2022-06-13 DIAGNOSIS — Z950 Presence of cardiac pacemaker: Secondary | ICD-10-CM | POA: Diagnosis present

## 2022-06-13 DIAGNOSIS — E43 Unspecified severe protein-calorie malnutrition: Secondary | ICD-10-CM

## 2022-06-13 DIAGNOSIS — Z66 Do not resuscitate: Secondary | ICD-10-CM | POA: Diagnosis present

## 2022-06-13 DIAGNOSIS — Z8249 Family history of ischemic heart disease and other diseases of the circulatory system: Secondary | ICD-10-CM

## 2022-06-13 DIAGNOSIS — Z885 Allergy status to narcotic agent status: Secondary | ICD-10-CM

## 2022-06-13 DIAGNOSIS — I255 Ischemic cardiomyopathy: Secondary | ICD-10-CM | POA: Diagnosis present

## 2022-06-13 DIAGNOSIS — S0101XA Laceration without foreign body of scalp, initial encounter: Secondary | ICD-10-CM | POA: Diagnosis present

## 2022-06-13 DIAGNOSIS — Z888 Allergy status to other drugs, medicaments and biological substances status: Secondary | ICD-10-CM

## 2022-06-13 DIAGNOSIS — Z7901 Long term (current) use of anticoagulants: Secondary | ICD-10-CM

## 2022-06-13 DIAGNOSIS — R0902 Hypoxemia: Secondary | ICD-10-CM | POA: Diagnosis present

## 2022-06-13 DIAGNOSIS — W19XXXA Unspecified fall, initial encounter: Secondary | ICD-10-CM | POA: Diagnosis present

## 2022-06-13 DIAGNOSIS — Z87891 Personal history of nicotine dependence: Secondary | ICD-10-CM

## 2022-06-13 DIAGNOSIS — F03A0 Unspecified dementia, mild, without behavioral disturbance, psychotic disturbance, mood disturbance, and anxiety: Secondary | ICD-10-CM | POA: Diagnosis present

## 2022-06-13 DIAGNOSIS — Z823 Family history of stroke: Secondary | ICD-10-CM

## 2022-06-13 DIAGNOSIS — H04123 Dry eye syndrome of bilateral lacrimal glands: Secondary | ICD-10-CM | POA: Diagnosis present

## 2022-06-13 DIAGNOSIS — I495 Sick sinus syndrome: Secondary | ICD-10-CM | POA: Diagnosis present

## 2022-06-13 DIAGNOSIS — Z8673 Personal history of transient ischemic attack (TIA), and cerebral infarction without residual deficits: Secondary | ICD-10-CM

## 2022-06-13 DIAGNOSIS — Z801 Family history of malignant neoplasm of trachea, bronchus and lung: Secondary | ICD-10-CM

## 2022-06-13 DIAGNOSIS — Z23 Encounter for immunization: Secondary | ICD-10-CM

## 2022-06-13 DIAGNOSIS — Z7983 Long term (current) use of bisphosphonates: Secondary | ICD-10-CM

## 2022-06-13 DIAGNOSIS — I482 Chronic atrial fibrillation, unspecified: Secondary | ICD-10-CM | POA: Diagnosis present

## 2022-06-13 DIAGNOSIS — S0003XA Contusion of scalp, initial encounter: Secondary | ICD-10-CM

## 2022-06-13 LAB — COMPREHENSIVE METABOLIC PANEL
ALT: 16 U/L (ref 0–44)
AST: 20 U/L (ref 15–41)
Albumin: 4.2 g/dL (ref 3.5–5.0)
Alkaline Phosphatase: 58 U/L (ref 38–126)
Anion gap: 8 (ref 5–15)
BUN: 25 mg/dL — ABNORMAL HIGH (ref 8–23)
CO2: 26 mmol/L (ref 22–32)
Calcium: 9.6 mg/dL (ref 8.9–10.3)
Chloride: 106 mmol/L (ref 98–111)
Creatinine, Ser: 1.25 mg/dL — ABNORMAL HIGH (ref 0.44–1.00)
GFR, Estimated: 41 mL/min — ABNORMAL LOW (ref >60)
Glucose, Bld: 106 mg/dL — ABNORMAL HIGH (ref 70–99)
Potassium: 4.7 mmol/L (ref 3.5–5.1)
Sodium: 140 mmol/L (ref 135–145)
Total Bilirubin: 0.4 mg/dL (ref 0.3–1.2)
Total Protein: 7.1 g/dL (ref 6.5–8.1)

## 2022-06-13 LAB — SAMPLE TO BLOOD BANK

## 2022-06-13 LAB — CBC
HCT: 32.5 % — ABNORMAL LOW (ref 36.0–46.0)
Hemoglobin: 10 g/dL — ABNORMAL LOW (ref 12.0–15.0)
MCH: 29.5 pg (ref 26.0–34.0)
MCHC: 30.8 g/dL (ref 30.0–36.0)
MCV: 95.9 fL (ref 80.0–100.0)
Platelets: 167 10*3/uL (ref 150–400)
RBC: 3.39 MIL/uL — ABNORMAL LOW (ref 3.87–5.11)
RDW: 13.3 % (ref 11.5–15.5)
WBC: 8.3 10*3/uL (ref 4.0–10.5)
nRBC: 0 % (ref 0.0–0.2)

## 2022-06-13 LAB — I-STAT CHEM 8, ED
BUN: 28 mg/dL — ABNORMAL HIGH (ref 8–23)
Calcium, Ion: 1.14 mmol/L — ABNORMAL LOW (ref 1.15–1.40)
Chloride: 106 mmol/L (ref 98–111)
Creatinine, Ser: 1.1 mg/dL — ABNORMAL HIGH (ref 0.44–1.00)
Glucose, Bld: 124 mg/dL — ABNORMAL HIGH (ref 70–99)
HCT: 29 % — ABNORMAL LOW (ref 36.0–46.0)
Hemoglobin: 9.9 g/dL — ABNORMAL LOW (ref 12.0–15.0)
Potassium: 4.8 mmol/L (ref 3.5–5.1)
Sodium: 138 mmol/L (ref 135–145)
TCO2: 22 mmol/L (ref 22–32)

## 2022-06-13 LAB — PROTIME-INR
INR: 2.3 — ABNORMAL HIGH (ref 0.8–1.2)
Prothrombin Time: 24.7 seconds — ABNORMAL HIGH (ref 11.4–15.2)

## 2022-06-13 LAB — ETHANOL: Alcohol, Ethyl (B): <10 mg/dL (ref <10)

## 2022-06-13 MED ORDER — FENTANYL CITRATE PF 50 MCG/ML IJ SOSY
25.0000 ug | PREFILLED_SYRINGE | Freq: Once | INTRAMUSCULAR | Status: AC
  Administered 2022-06-13: 25 ug via INTRAVENOUS
  Filled 2022-06-13: qty 1

## 2022-06-13 MED ORDER — LIDOCAINE HCL (PF) 1 % IJ SOLN
5.0000 mL | Freq: Once | INTRAMUSCULAR | Status: AC
  Administered 2022-06-13: 5 mL
  Filled 2022-06-13: qty 5

## 2022-06-13 MED ORDER — TETANUS-DIPHTH-ACELL PERTUSSIS 5-2.5-18.5 LF-MCG/0.5 IM SUSY
0.5000 mL | PREFILLED_SYRINGE | Freq: Once | INTRAMUSCULAR | Status: AC
  Administered 2022-06-13: 0.5 mL via INTRAMUSCULAR
  Filled 2022-06-13: qty 0.5

## 2022-06-13 MED ORDER — LACTATED RINGERS IV BOLUS
1000.0000 mL | Freq: Once | INTRAVENOUS | Status: AC
  Administered 2022-06-13: 1000 mL via INTRAVENOUS

## 2022-06-13 MED ORDER — ACETAMINOPHEN 325 MG PO TABS: 650.0000 mg | ORAL_TABLET | Freq: Once | ORAL | Status: DC

## 2022-06-13 NOTE — Plan of Care (Signed)
I was paged about abnormal EKG. EKG reviewed A-fib with ST changes suggestive of digoxin toxicity. Conferred 88 year old demented lady presents with fall.  Cardiovascular history is complicated by hypertension, hyperlipidemia, ischemic cardiomyopathy with heart of 25 to 30%, with dual-chamber pacemaker, chronic A-fib on Coumadin. Assessment Overall presentation is consistent with his injections existing in setting of poor renal function and worsening dementia. Follow-up TTE in the morning. Appropriate hydration. Potassium 4, Mg 2 Stop digoxin Please follow-up with cardiology in the morning after TTE is completed if full consult is needed

## 2022-06-13 NOTE — ED Notes (Addendum)
Trauma Event Note   TRN at bedside to round, Level two activation fall on thinners (coumadin) with posterior head laceration. Bleeding controlled, pending EDP repair with staples, lidocaine and suture cart brought to bedside. Assisted primary ED team with EKG, IV access and blood draw after syncopal episode/desating while getting her wound cleaned. Paramedic cleaning glass out of the wound and pt stated that her breathing wasn't right and desatted on room air to 88%. Patient with EKG changes new ST depression, plan to consult cards and anticipate admission for further workup. Denies chest pain, however states she doesn't feel right and states her breathing is different than usual. Reports improvement with 3L oxygen via nasal cannula. Family at bedside, updates provided by EDP, who is at bedside performing lac repair. Pending admission/lab results.  Last imported Vital Signs BP (!) 107/55   Pulse (!) 113   Temp 98 F (36.7 C) (Oral)   Resp (!) 21   Ht 5\' 7"  (1.702 m)   Wt 100 lb (45.4 kg)   SpO2 98%   BMI 15.66 kg/m   Trending CBC Recent Labs    06/13/22 2029  WBC 8.3  HGB 10.0*  HCT 32.5*  PLT 167    Trending Coag's Recent Labs    06/13/22 2029  INR 2.3*    Trending BMET Recent Labs    06/13/22 2029  NA 140  K 4.7  CL 106  CO2 26  BUN 25*  CREATININE 1.25*  GLUCOSE 106*      Basel Defalco O Aubreana Cornacchia  Trauma Response RN  Please call TRN at [phone] for further assistance.

## 2022-06-13 NOTE — ED Triage Notes (Signed)
Pt BIB [facility] from home after falling into stove door and breaking it. Pt has 1" lac to posterior head. A&Ox4, c/o head pain. VS stable. Provider at bedside

## 2022-06-13 NOTE — Progress Notes (Signed)
Orthopedic Tech Progress Note Patient Details:  Carol Snyder 1929/09/03 [id number]  Patient ID: Carol Snyder, female   DOB: 1929-08-05, 88 y.o.   MRN: [id number] I attended trauma page. Trinna Post 06/13/2022, 8:38 PM

## 2022-06-14 ENCOUNTER — Observation Stay (HOSPITAL_COMMUNITY): Payer: Medicare PPO

## 2022-06-14 ENCOUNTER — Encounter (HOSPITAL_COMMUNITY): Payer: Self-pay | Admitting: Family Medicine

## 2022-06-14 ENCOUNTER — Other Ambulatory Visit (HOSPITAL_COMMUNITY): Payer: Medicare PPO

## 2022-06-14 ENCOUNTER — Other Ambulatory Visit: Payer: Self-pay | Admitting: Cardiology

## 2022-06-14 ENCOUNTER — Observation Stay (HOSPITAL_BASED_OUTPATIENT_CLINIC_OR_DEPARTMENT_OTHER): Payer: Medicare PPO

## 2022-06-14 ENCOUNTER — Other Ambulatory Visit: Payer: Self-pay

## 2022-06-14 DIAGNOSIS — I5022 Chronic systolic (congestive) heart failure: Secondary | ICD-10-CM

## 2022-06-14 DIAGNOSIS — W19XXXD Unspecified fall, subsequent encounter: Secondary | ICD-10-CM

## 2022-06-14 DIAGNOSIS — Z950 Presence of cardiac pacemaker: Secondary | ICD-10-CM

## 2022-06-14 DIAGNOSIS — R55 Syncope and collapse: Secondary | ICD-10-CM | POA: Diagnosis not present

## 2022-06-14 DIAGNOSIS — M533 Sacrococcygeal disorders, not elsewhere classified: Secondary | ICD-10-CM

## 2022-06-14 DIAGNOSIS — I482 Chronic atrial fibrillation, unspecified: Secondary | ICD-10-CM | POA: Diagnosis not present

## 2022-06-14 DIAGNOSIS — S0003XA Contusion of scalp, initial encounter: Secondary | ICD-10-CM

## 2022-06-14 LAB — URINALYSIS, ROUTINE W REFLEX MICROSCOPIC
Bacteria, UA: NONE SEEN
Bilirubin Urine: NEGATIVE
Glucose, UA: NEGATIVE mg/dL
Hgb urine dipstick: NEGATIVE
Ketones, ur: NEGATIVE mg/dL
Nitrite: NEGATIVE
Protein, ur: NEGATIVE mg/dL
Specific Gravity, Urine: 1.01 (ref 1.005–1.030)
pH: 5 (ref 5.0–8.0)

## 2022-06-14 LAB — PROTIME-INR
INR: 2.5 — ABNORMAL HIGH (ref 0.8–1.2)
Prothrombin Time: 26.8 seconds — ABNORMAL HIGH (ref 11.4–15.2)

## 2022-06-14 LAB — BASIC METABOLIC PANEL
Anion gap: 8 (ref 5–15)
BUN: 24 mg/dL — ABNORMAL HIGH (ref 8–23)
CO2: 22 mmol/L (ref 22–32)
Calcium: 9.2 mg/dL (ref 8.9–10.3)
Chloride: 109 mmol/L (ref 98–111)
Creatinine, Ser: 1.15 mg/dL — ABNORMAL HIGH (ref 0.44–1.00)
GFR, Estimated: 45 mL/min — ABNORMAL LOW (ref >60)
Glucose, Bld: 132 mg/dL — ABNORMAL HIGH (ref 70–99)
Potassium: 4.7 mmol/L (ref 3.5–5.1)
Sodium: 139 mmol/L (ref 135–145)

## 2022-06-14 LAB — CBC
HCT: 30 % — ABNORMAL LOW (ref 36.0–46.0)
Hemoglobin: 9.6 g/dL — ABNORMAL LOW (ref 12.0–15.0)
MCH: 30.2 pg (ref 26.0–34.0)
MCHC: 32 g/dL (ref 30.0–36.0)
MCV: 94.3 fL (ref 80.0–100.0)
Platelets: 139 10*3/uL — ABNORMAL LOW (ref 150–400)
RBC: 3.18 MIL/uL — ABNORMAL LOW (ref 3.87–5.11)
RDW: 13.3 % (ref 11.5–15.5)
WBC: 11.5 10*3/uL — ABNORMAL HIGH (ref 4.0–10.5)
nRBC: 0 % (ref 0.0–0.2)

## 2022-06-14 LAB — ECHOCARDIOGRAM COMPLETE
AR max vel: 0.95 cm2
AV Area VTI: 0.82 cm2
AV Area mean vel: 0.91 cm2
AV Mean grad: 9.3 mmHg
AV Peak grad: 16.6 mmHg
Ao pk vel: 2.04 m/s
Height: 68 in
S' Lateral: 3 cm
Weight: 1684.8 oz

## 2022-06-14 LAB — TROPONIN I (HIGH SENSITIVITY)
Troponin I (High Sensitivity): 14 ng/L (ref <18)
Troponin I (High Sensitivity): 36 ng/L — ABNORMAL HIGH (ref <18)

## 2022-06-14 LAB — DIGOXIN LEVEL: Digoxin Level: 0.6 ng/mL — ABNORMAL LOW (ref 0.8–2.0)

## 2022-06-14 LAB — LACTIC ACID, PLASMA: Lactic Acid, Venous: 2.2 mmol/L (ref 0.5–1.9)

## 2022-06-14 MED ORDER — ENSURE ENLIVE PO LIQD
237.0000 mL | Freq: Two times a day (BID) | ORAL | Status: DC
  Administered 2022-06-15 – 2022-06-16 (×3): 237 mL via ORAL
  Filled 2022-06-14: qty 237

## 2022-06-14 MED ORDER — WARFARIN SODIUM 2.5 MG PO TABS
2.5000 mg | ORAL_TABLET | Freq: Once | ORAL | Status: DC
Start: 2022-06-15 — End: 2022-06-14

## 2022-06-14 MED ORDER — ACETAMINOPHEN 325 MG PO TABS
650.0000 mg | ORAL_TABLET | Freq: Four times a day (QID) | ORAL | Status: DC | PRN
  Administered 2022-06-14 – 2022-06-16 (×4): 650 mg via ORAL
  Filled 2022-06-14 (×4): qty 2

## 2022-06-14 MED ORDER — WARFARIN - PHARMACIST DOSING INPATIENT: Freq: Every day | Status: DC

## 2022-06-14 MED ORDER — ENSURE MAX PROTEIN PO LIQD
11.0000 [oz_av] | Freq: Every day | ORAL | Status: DC
  Administered 2022-06-17: 11 [oz_av] via ORAL
  Filled 2022-06-14 (×4): qty 330

## 2022-06-14 MED ORDER — WARFARIN SODIUM 5 MG PO TABS
5.0000 mg | ORAL_TABLET | Freq: Once | ORAL | Status: AC
Start: 2022-06-14 — End: 2022-06-14
  Administered 2022-06-14: 5 mg via ORAL
  Filled 2022-06-14: qty 1

## 2022-06-14 NOTE — Progress Notes (Signed)
  Echocardiogram 2D Echocardiogram has been performed.  Delcie Roch 06/14/2022, 5:56 PM

## 2022-06-14 NOTE — Assessment & Plan Note (Signed)
Bleeding has been controlled with multiple staple at the scalp.

## 2022-06-14 NOTE — H&P (Addendum)
History and Physical    Patient: Carol Snyder EZM:[id number] DOB: Aug 05, 1929 DOA: 06/13/2022 DOS: the patient was seen and examined on 06/14/2022 PCP: Kaleen Mask, MD  Patient coming from: Home  Chief Complaint:  Chief Complaint  Patient presents with   LVL 2   HPI: Carol Snyder is a 88 y.o. female with medical history significant of ischemic cardiomyopathy, tachybradycardia syndrome with pacemaker, chronic atrial fibrillation on Coumadin, CVA, dementia who presents following a fall.  Patient lives with her daughter who is at bedside.  She has mild dementia.  Patient reports that she was trying to get ice cream to make a shake out of the fridge when she suddenly blacked out and fell onto the oven breaking the oven glass.  Daughter was in a separate room and when she found her patient was already sitting up on her own.  Reports she was otherwise in her normal state of health. she does not report a history of frequent falls but was evaluated in the ED back in May for syncopal event.  At that time she had a mild AKI and had her pacemaker interrogated which did not show any arrhythmia.  She was on 120 mg daily Lasix at that time and was asked to decrease to 80 mg and increase fluid intake.   In the ED, she was initially noted to be hypotensive in the 80s which later resolved with 1 L of normal saline bolus.  She was tachycardic and tachypneic.  There was EKG findings concerning for inverted T waves in the inferior lateral leads and slight ST depression. Cardiology was then consulted and suspect that changes could be related to digoxin toxicity and recommended holding digoxin as well as obtaining an echocardiogram.  Hospitals then consulted for admission.  Review of Systems: As mentioned in the history of present illness. All other systems reviewed and are negative. Past Medical History:  Diagnosis Date   Anemia    Aortic insufficiency    Chest pain    Chronic diarrhea    DNR (do not  resuscitate)    Dysrhythmia    afib   Falling    History of GI bleed    History of shingles    Long term current use of anticoagulant    Mitral regurgitation    Orthostatic hypotension    Persistent atrial fibrillation (HCC)    on Coumadin   Pleural effusion    Sinus node dysfunction (HCC)    a. s/p Biotronik PPM.   Stroke [facility])    Tricuspid regurgitation    Past Surgical History:  Procedure Laterality Date   APPENDECTOMY     BIOPSY  08/13/2021   Procedure: BIOPSY;  Surgeon: Kathi Der, MD;  Location: [facility] ENDOSCOPY;  Service: Gastroenterology;;   CHOLECYSTECTOMY     with partial colectomy do to gallbadder infection    ESOPHAGOGASTRODUODENOSCOPY (EGD) WITH PROPOFOL N/A 08/13/2021   Procedure: ESOPHAGOGASTRODUODENOSCOPY (EGD) WITH PROPOFOL;  Surgeon: Kathi Der, MD;  Location: [facility] ENDOSCOPY;  Service: Gastroenterology;  Laterality: N/A;   IR THORACENTESIS ASP PLEURAL SPACE W/IMG GUIDE  06/22/2018   IR THORACENTESIS ASP PLEURAL SPACE W/IMG GUIDE  09/29/2018   IR THORACENTESIS ASP PLEURAL SPACE W/IMG GUIDE  07/05/2019   PACEMAKER IMPLANT  06/04/2016   BTK Edora 8 DR-T implanted by Dr Rudolpho Sevin at [facility] for sick sinus syndrome   Social History:  reports that she quit smoking about 17 years ago. Her smoking use included cigarettes. She has never used smokeless  tobacco. She reports that she does not drink alcohol and does not use drugs.  Allergies  Allergen Reactions   Codeine Other (See Comments)    unknown   Enbrel [Etanercept] Other (See Comments)    unknown   Hydromorphone Other (See Comments)    unknown   Motrin [Ibuprofen] Other (See Comments)    Due to blood thinner    Sulfa Antibiotics Other (See Comments)    Childhood allergy    Voltaren [Diclofenac] Other (See Comments)    unknown   Naproxen Sodium Palpitations    Family History  Problem Relation Age of Onset   Heart attack Father    Hypertension Father    Stroke Mother    Cancer Brother         lung   Arrhythmia Daughter        heart palpitations    Prior to Admission medications   Medication Sig Start Date End Date Taking? Authorizing Provider  acetaminophen (TYLENOL) 500 MG tablet Take 1,000 mg by mouth in the morning and at bedtime.    [provider]  alendronate (FOSAMAX) 70 MG tablet Take 70 mg by mouth once a week. Sunday    [provider]  Camphor-Menthol-Methyl Sal (SALONPAS) 3.11-22-08 % PTCH Apply 1 patch topically daily as needed (pain).    [provider]  cholecalciferol (VITAMIN D3) 25 MCG (1000 UNIT) tablet Take 1,000 Units by mouth daily.    [provider]  cholestyramine (QUESTRAN) 4 g packet Take 2 g by mouth daily as needed (Diarrhea). Half pack daily    [provider]  digoxin (LANOXIN) 0.125 MG tablet Take 0.5 tablets (62.5 mcg total) by mouth daily. 03/17/22   Jake Bathe, MD  enoxaparin (LOVENOX) 40 MG/0.4ML injection Inject 40 mg into the skin daily.    [provider]  fluticasone (FLONASE) 50 MCG/ACT nasal spray Place 1 spray into both nostrils daily as needed for allergies. 08/22/20   [provider]  furosemide (LASIX) 80 MG tablet Take 120 mg by mouth daily.    [provider]  furosemide (LASIX) 80 MG tablet Take 1 tablet (80 mg total) by mouth daily. 02/06/21   Bhagat, Sharrell Ku, PA  hydroxypropyl methylcellulose / hypromellose (ISOPTO TEARS / GONIOVISC) 2.5 % ophthalmic solution Place 1 drop into both eyes 3 (three) times daily as needed for dry eyes.    [provider]  loperamide (IMODIUM) 2 MG capsule Take 4 mg by mouth daily.    [provider]  loratadine (CLARITIN) 10 MG tablet Take 10 mg by mouth daily.    [provider]  Menthol, Topical Analgesic, (BIOFREEZE EX) Apply 1 application topically daily as needed (pain).    [provider]  metoprolol succinate (TOPROL-XL) 100 MG 24 hr tablet Take 1 tablet (100 mg total) by mouth  daily. Pt. Need to schedule appt. With Cardiologist in order to receive future refills. Thank You. 2nd Attempt. 05/26/22   Jake Bathe, MD  nitroGLYCERIN (NITROSTAT) 0.4 MG SL tablet Place 1 tablet (0.4 mg total) under the tongue every 5 (five) minutes as needed for chest pain. 09/12/19   Filbert Schilder, NP  pantoprazole (PROTONIX) 40 MG tablet Take 1 tablet (40 mg total) by mouth 2 (two) times daily. 08/13/21 08/13/22  Kathi Der, MD  potassium chloride SA (KLOR-CON M20) 20 MEQ tablet Take 1 tablet (20 mEq total) by mouth daily. 06/09/22   Jake Bathe, MD  sucralfate (CARAFATE) 1 GM/10ML suspension Take  10 mLs (1 g total) by mouth 2 (two) times daily. 08/13/21 08/13/22  Kathi Der, MD  warfarin (COUMADIN) 5 MG tablet Take 2.5 mg by mouth daily.    [provider]    Physical Exam: Vitals:   06/14/22 0030 06/14/22 0031 06/14/22 0033 06/14/22 0039  BP: 121/73     Pulse: 96 (!) 101 93 97  Resp:  14 (!) 22 (!) 21  Temp:      TempSrc:      SpO2:  90% 94% 98%  Weight:      Height:       Constitutional: NAD, calm, comfortable, elderly female laying in bed with a towel over her eyes Eyes: lids and conjunctivae normal ENMT: Mucous membranes are moist.   Neck: normal, supple Respiratory: clear to auscultation bilaterally, no wheezing, no crackles. Normal respiratory effort.  Cardiovascular: Regular rate and rhythm, no murmurs / rubs / gallops. No extremity edema.  Abdomen: no tenderness,  Bowel sounds positive.  Musculoskeletal: no clubbing / cyanosis. No joint deformity upper and lower extremities.  Skin: parietal scalp laceration with staples in place and minimal bleeding Neurologic: CN 2-12 grossly intact. Sensation normal. Strength 5/5 in all 4.  She is alert and oriented to self, place and current event but not time Psychiatric: Normal judgment and insight. Alert and oriented x 3. Normal mood. Data Reviewed:  See HPI  Assessment and Plan: * Syncope and  collapse Patient unable to provide much history regarding her fall other than that she believes she had loss of consciousness prior. -She also had new slight ST depression and T wave inversion to her inferior lateral leads.  Cardiology was consulted by EDP and thought her EKG changes could be related to digoxin toxicity.  Asked that digoxin be held and to obtain echocardiogram. However daughter is in charge of her medications and her last dose was Thursday. -She does have dual-chamber pacemaker--will need to interrogate as well -keep on continuous telemetry -troponin is pending  Sacral pain Reports pain around her sacral/coccyx region. Obtain pelvic X-ray.  Chronic systolic CHF (congestive heart failure) (HCC) -Last echocardiogram in 06/2019 with EF of 25 to 30%.  Severe akinesis of the entire inferior wall of the left ventricle.  Moderate thickening of the mitral valve. -Has already received 1 L of normal saline in the ED.  We will hold on any further fluids at this time. -Obtain echocardiogram as noted above  Scalp hematoma, initial encounter Bleeding has been controlled with multiple staple at the scalp.  Atrial fibrillation, chronic (HCC) INR is therapeutic at 2.8. -Continue warfarin with dosing per pharmacy      Advance Care Planning:   Code Status: DNR-confirmed at bedside  Consults: Consider formal cardiology consult in the morning  Family Communication: Discussed with daughter at bedside  Severity of Illness: The appropriate patient status for this patient is OBSERVATION. Observation status is judged to be reasonable and necessary in order to provide the required intensity of service to ensure the patient's safety. The patient's presenting symptoms, physical exam findings, and initial radiographic and laboratory data in the context of their medical condition is felt to place them at decreased risk for further clinical deterioration. Furthermore, it is anticipated that the  patient will be medically stable for discharge from the hospital within 2 midnights of admission.   Author: Anselm Jungling, DO 06/14/2022 1:07 AM  For on call review [email or web address].

## 2022-06-14 NOTE — Assessment & Plan Note (Signed)
INR is therapeutic at 2.8. -Continue warfarin with dosing per pharmacy

## 2022-06-14 NOTE — ED Provider Notes (Addendum)
[facility] EMERGENCY DEPARTMENT Provider Note   CSN: [id number] Arrival date & time: 06/13/22  2017     History  Chief Complaint  Patient presents with   LVL 2    Carol Snyder is a 88 y.o. female with history of CHF, CAD, A-fib on warfarin presenting to the emergency department after fall.  Patient was in the kitchen when she fell and hit head.  Patient initially stated that she might have slipped, however daughter reports that the patient has mild dementia and does not remember.  On reassessment, patient reports she is unsure how she fell.  Patient otherwise denies pain elsewhere in her body denies any headache, neck pain, chest pain, abdominal pain.  She denies shortness of breath.  Patient's daughter reports that she heard a thump but otherwise did not witness the event.  Patient's head ache loss on the oven.  HPI     Home Medications Prior to Admission medications   Medication Sig Start Date End Date Taking? Authorizing Provider  acetaminophen (TYLENOL) 500 MG tablet Take 1,000 mg by mouth in the morning and at bedtime.    [provider]  alendronate (FOSAMAX) 70 MG tablet Take 70 mg by mouth once a week. Sunday    [provider]  Camphor-Menthol-Methyl Sal (SALONPAS) 3.11-22-08 % PTCH Apply 1 patch topically daily as needed (pain).    [provider]  cholecalciferol (VITAMIN D3) 25 MCG (1000 UNIT) tablet Take 1,000 Units by mouth daily.    [provider]  cholestyramine (QUESTRAN) 4 g packet Take 2 g by mouth daily as needed (Diarrhea). Half pack daily    [provider]  digoxin (LANOXIN) 0.125 MG tablet Take 0.5 tablets (62.5 mcg total) by mouth daily. 03/17/22   Jake Bathe, MD  enoxaparin (LOVENOX) 40 MG/0.4ML injection Inject 40 mg into the skin daily.    [provider]  fluticasone (FLONASE) 50 MCG/ACT nasal spray Place 1 spray into both nostrils daily as needed for allergies. 08/22/20   [provider]  furosemide (LASIX) 80 MG tablet Take 120 mg by mouth daily.    [provider]  furosemide (LASIX) 80 MG tablet Take 1 tablet (80 mg total) by mouth daily. 02/06/21   Bhagat, Sharrell Ku, PA  hydroxypropyl methylcellulose / hypromellose (ISOPTO TEARS / GONIOVISC) 2.5 % ophthalmic solution Place 1 drop into both eyes 3 (three) times daily as needed for dry eyes.    [provider]  loperamide (IMODIUM) 2 MG capsule Take 4 mg by mouth daily.    [provider]  loratadine (CLARITIN) 10 MG tablet Take 10 mg by mouth daily.    [provider]  Menthol, Topical Analgesic, (BIOFREEZE EX) Apply 1 application topically daily as needed (pain).    [provider]  metoprolol succinate (TOPROL-XL) 100 MG 24 hr tablet Take 1 tablet (100 mg total) by mouth daily. Pt. Need to schedule appt. With Cardiologist in order to receive future refills. Thank You. 2nd Attempt. 05/26/22   Jake Bathe, MD  nitroGLYCERIN (NITROSTAT) 0.4 MG SL tablet Place 1 tablet (0.4 mg total) under the tongue every 5 (five) minutes as needed for chest pain. 09/12/19   Filbert Schilder, NP  pantoprazole (PROTONIX) 40 MG tablet Take 1 tablet (40 mg total) by mouth 2 (two) times daily. 08/13/21 08/13/22  Kathi Der, MD  potassium chloride SA (KLOR-CON M20) 20 MEQ tablet Take 1 tablet (20 mEq total) by mouth daily. 06/09/22  Jake Bathe, MD  sucralfate (CARAFATE) 1 GM/10ML suspension Take 10 mLs (1 g total) by mouth 2 (two) times daily. 08/13/21 08/13/22  Kathi Der, MD  warfarin (COUMADIN) 5 MG tablet Take 2.5 mg by mouth daily.    [provider]      Allergies    Codeine, Enbrel [etanercept], Hydromorphone, Motrin [ibuprofen], Sulfa antibiotics, Voltaren [diclofenac], and Naproxen sodium    Review of Systems   Review of Systems See HPI Physical Exam Updated Vital Signs BP 110/69   Pulse (!) 111   Temp 98 F (36.7 C) (Oral)   Resp (!) 26   Ht 5\' 7"   (1.702 m)   Wt 45.4 kg   SpO2 94%   BMI 15.66 kg/m  Physical Exam Vitals and nursing note reviewed.  Constitutional:      Appearance: Normal appearance. She is not toxic-appearing.  HENT:     Head:     Comments: Large hematoma to the right posterior scalp    Right Ear: External ear normal.     Left Ear: External ear normal.     Nose: Nose normal.     Mouth/Throat:     Mouth: Mucous membranes are moist.  Eyes:     Conjunctiva/sclera: Conjunctivae normal.  Cardiovascular:     Rate and Rhythm: Normal rate. Rhythm irregular.  Pulmonary:     Effort: Pulmonary effort is normal. No respiratory distress.     Breath sounds: Normal breath sounds.  Abdominal:     General: Abdomen is flat.     Palpations: Abdomen is soft.  Musculoskeletal:     Cervical back: Normal range of motion. No tenderness.     Comments: Full range of motion without deformity or tenderness of the bilateral upper extremities at the shoulders, elbows, wrists, fingers.  Additionally, full range of motion without deformity or tenderness of the bilateral lower extremities at hips, knees, ankles, toes.  No midline C, T, L-spine tenderness or step-off.  No tenderness to sacrum.  No chest wall tenderness  Skin:    General: Skin is warm and dry.     Capillary Refill: Capillary refill takes less than 2 seconds.     Comments: Small laceration to right posterior scalp  Neurological:     General: No focal deficit present.     Mental Status: She is alert and oriented to person, place, and time.  Psychiatric:        Mood and Affect: Mood normal.        Behavior: Behavior normal.     ED Results / Procedures / Treatments   Labs (all labs ordered are listed, but only abnormal results are displayed) Labs Reviewed  COMPREHENSIVE METABOLIC PANEL - Abnormal; Notable for the following components:      Result Value   Glucose, Bld 106 (*)    BUN 25 (*)    Creatinine, Ser 1.25 (*)    GFR, Estimated 41 (*)    All other components  within normal limits  CBC - Abnormal; Notable for the following components:   RBC 3.39 (*)    Hemoglobin 10.0 (*)    HCT 32.5 (*)    All other components within normal limits  PROTIME-INR - Abnormal; Notable for the following components:   Prothrombin Time 24.7 (*)    INR 2.3 (*)    All other components within normal limits  LACTIC ACID, PLASMA - Abnormal; Notable for the following components:   Lactic Acid, Venous 2.2 (*)    All  other components within normal limits  I-STAT CHEM 8, ED - Abnormal; Notable for the following components:   BUN 28 (*)    Creatinine, Ser 1.10 (*)    Glucose, Bld 124 (*)    Calcium, Ion 1.14 (*)    Hemoglobin 9.9 (*)    HCT 29.0 (*)    All other components within normal limits  ETHANOL  URINALYSIS, ROUTINE W REFLEX MICROSCOPIC  DIGOXIN LEVEL  SAMPLE TO BLOOD BANK  TROPONIN I (HIGH SENSITIVITY)    EKG None  Radiology CT CERVICAL SPINE WO CONTRAST  Result Date: 06/13/2022 CLINICAL DATA:  Recent fall with neck pain, initial encounter EXAM: CT CERVICAL SPINE WITHOUT CONTRAST TECHNIQUE: Multidetector CT imaging of the cervical spine was performed without intravenous contrast. Multiplanar CT image reconstructions were also generated. RADIATION DOSE REDUCTION: This exam was performed according to the departmental dose-optimization program which includes automated exposure control, adjustment of the mA and/or kV according to patient size and/or use of iterative reconstruction technique. COMPARISON:  04/09/2022 FINDINGS: Alignment: Stable in appearance from the prior exam. Mild anterolisthesis of C2 on C3 is noted with retrolisthesis of C3 on C4 and C4 on C5. Skull base and vertebrae: 7 cervical segments are well visualized. Vertebral body height is well maintained. Disc space narrowing is noted from C3-C6. Facet hypertrophic changes are noted as well as osteophytic changes. No acute fracture or acute facet abnormality is noted. The odontoid is within normal  limits. Soft tissues and spinal canal: Surrounding soft tissue structures are within normal limits. Some air is noted within the jugular venous system likely related to prior IV initiation. Upper chest: No acute rib abnormality is noted. Small right-sided pleural effusion is seen. No focal confluent infiltrate is noted. Some apical scarring is noted bilaterally. Other: None IMPRESSION: Degenerative changes similar to that seen on prior exam. No acute bony abnormality is seen. Electronically Signed   By: Alcide Clever M.D.   On: 06/13/2022 21:22   CT HEAD WO CONTRAST  Result Date: 06/13/2022 CLINICAL DATA:  Trauma. EXAM: CT HEAD WITHOUT CONTRAST TECHNIQUE: Contiguous axial images were obtained from the base of the skull through the vertex without intravenous contrast. RADIATION DOSE REDUCTION: This exam was performed according to the departmental dose-optimization program which includes automated exposure control, adjustment of the mA and/or kV according to patient size and/or use of iterative reconstruction technique. COMPARISON:  Head CT dated 06/05/2018. FINDINGS: Brain: Mild age-related atrophy and chronic microvascular ischemic changes. Small right basal ganglia old lacunar infarct. There is no acute intracranial hemorrhage. No mass effect or midline shift. No extra-axial fluid collection. Vascular: No hyperdense vessel or unexpected calcification. Skull: Normal. Negative for fracture or focal lesion. Sinuses/Orbits: There is partial opacification of the left maxillary sinus with air-fluid level. The remainder of the visualized paranasal sinuses and mastoid air cells are clear. Other: Right posterior parietal scalp laceration. A 4 mm radiopaque focus in the scalp adjacent to the laceration noted which is new since the CT of 2019. Clinical correlation recommended to evaluate for possibility of foreign object. IMPRESSION: 1. No acute intracranial pathology. 2. Mild age-related atrophy and chronic microvascular  ischemic changes. 3. Right parietal scalp laceration and possible foreign fragment. Electronically Signed   By: Elgie Collard M.D.   On: 06/13/2022 21:07   DG Chest Port 1 View  Result Date: 06/13/2022 CLINICAL DATA:  Recent fall with chest pain, initial encounter EXAM: PORTABLE CHEST 1 VIEW COMPARISON:  04/11/2022 FINDINGS: Cardiac shadow is enlarged but stable. Pacing  device is again seen. Aortic calcifications are noted. The lungs are well aerated bilaterally. Mild right basilar atelectasis is seen. No pneumothorax is noted. Old healed rib fracture of the right tenth rib posteriorly is noted and stable. IMPRESSION: Mild right basilar atelectasis. Stable cardiomegaly. Electronically Signed   By: Alcide CleverMark  Lukens M.D.   On: 06/13/2022 20:55    Procedures .1-3 Lead EKG Interpretation  Performed by: Lonell GrandchildScheving, Alsace Dowd L, MD Authorized by: Lonell GrandchildScheving, Vitalia Stough L, MD     Interpretation: abnormal     ECG rate assessment: normal     Rhythm: atrial fibrillation   .Marland Kitchen.Laceration Repair  Date/Time: 06/14/2022 12:28 AM  Performed by: Lonell GrandchildScheving, Caldwell Kronenberger L, MD Authorized by: Lonell GrandchildScheving, Tresten Pantoja L, MD   Consent:    Consent obtained:  Verbal   Consent given by:  Patient   Risks, benefits, and alternatives were discussed: yes     Risks discussed:  Retained foreign body   Alternatives discussed:  No treatment Universal protocol:    Procedure explained and questions answered to patient or proxy's satisfaction: yes     Relevant documents present and verified: yes     Test results available: yes     Imaging studies available: yes     Patient identity confirmed:  Verbally with patient and arm band Anesthesia:    Anesthesia method:  Local infiltration   Local anesthetic:  Lidocaine 1% w/o epi Laceration details:    Location:  Scalp   Scalp location:  R parietal   Length (cm):  1 Exploration:    Wound exploration: entire depth of wound visualized     Wound extent: no fascia violation noted and no foreign  bodies/material noted   Treatment:    Area cleansed with:  Saline   Amount of cleaning:  Standard   Irrigation solution:  Sterile saline   Irrigation method:  Syringe   Visualized foreign bodies/material removed: yes     Debridement:  None   Undermining:  None   Scar revision: no   Skin repair:    Repair method:  Staples   Number of staples:  5 Approximation:    Approximation:  Close Repair type:    Repair type:  Simple     Medications Ordered in ED Medications  acetaminophen (TYLENOL) tablet 650 mg (650 mg Oral Not Given 06/13/22 2339)  lactated ringers bolus 1,000 mL (1,000 mLs Intravenous New Bag/Given 06/13/22 2034)  fentaNYL (SUBLIMAZE) injection 25 mcg (25 mcg Intravenous Given 06/13/22 2116)  fentaNYL (SUBLIMAZE) injection 25 mcg (25 mcg Intravenous Given 06/13/22 2257)  Tdap (BOOSTRIX) injection 0.5 mL (0.5 mLs Intramuscular Given 06/13/22 2329)  lidocaine (PF) (XYLOCAINE) 1 % injection 5 mL (5 mLs Infiltration Given by Other 06/13/22 2331)    ED Course/ Medical Decision Making/ A&P Clinical Course as of 06/14/22 0030  Fri Jun 13, 2022  2340 Discussed with Dr. Hector BrunswickIvanov. ECG not concerning for acute ACS, he suspects patient may be dehydrated or overdosing on lasix. Recommends admission and echocardiogram in the morning. [WS]  Sat Jun 14, 2022  0001 Troponin I (High Sensitivity) Discussed with Dr. Cyndia Bentu who will admit. [WS]    Clinical Course User Index [WS] Lonell GrandchildScheving, Zori Benbrook L, MD                           Medical Decision Making Amount and/or Complexity of Data Reviewed Labs: ordered. Decision-making details documented in ED Course. Radiology: ordered. ECG/medicine tests: ordered.  Risk OTC drugs. Prescription drug  management. Decision regarding hospitalization.   88 year old female presenting to the emergency department as a trauma status post fall  Patient traumatic evaluation notable only for hematoma to the head, CT scan performed, without evidence of  intracranial injury.  CT C-spine also performed without evidence of fracture.  Patient tetanus updated, wound repaired primarily with staples.  Glass noted as possible foreign body on CT scan, wound was extensively irrigated and glass was removed, discussed that there may be retained glass although I explored the wound fully without evidence of retained glass or foreign body.   Daughter arrived later, and provided additional history that the patient likely did not remember the cause of the fall.  Patient also had a fall at the end of May, reviewed these records including emergency department notes.  Based on this additional history, obtained EKG which is abnormal with diffuse ST depressions, with new depressions in the lateral leads.  Discussed this with cardiology, who recommends further evaluation with echocardiogram, but given patient is not with chest pain or shortness of breath at this time, lower concern for ACS.  Discussed with the hospitalist will admit the patient for further evaluation.         Final Clinical Impression(s) / ED Diagnoses Final diagnoses:  Syncope and collapse  Hematoma of scalp, initial encounter  Laceration of scalp due to birth trauma    Rx / DC Orders ED Discharge Orders     None         Lonell Grandchild, MD 06/14/22 5643    Lonell Grandchild, MD 06/14/22 3295    Lonell Grandchild, MD 06/14/22 0030

## 2022-06-14 NOTE — Assessment & Plan Note (Signed)
Reports pain around her sacral/coccyx region. Obtain pelvic X-ray.

## 2022-06-14 NOTE — Progress Notes (Signed)
[employer] notified of pt's pacemaker needing to be interrogated, Per ED RN. A representative will be sent out per customer service rep.

## 2022-06-14 NOTE — Progress Notes (Deleted)
ANTICOAGULATION CONSULT NOTE - Initial Consult  Pharmacy Consult for coumadin dosing Indication: atrial fibrillation  Allergies  Allergen Reactions   Codeine Other (See Comments)    unknown   Enbrel [Etanercept] Other (See Comments)    unknown   Hydromorphone Other (See Comments)    unknown   Motrin [Ibuprofen] Other (See Comments)    Due to blood thinner    Sulfa Antibiotics Other (See Comments)    Childhood allergy    Voltaren [Diclofenac] Other (See Comments)    unknown   Naproxen Sodium Palpitations    Patient Measurements: Height: 5\' 8"  (172.7 cm) Weight: 47.8 kg (105 lb 4.8 oz) IBW/kg (Calculated) : 63.9   Vital Signs: Temp: 97.8 F (36.6 C) (07/29 1240) Temp Source: Oral (07/29 1240) BP: 110/67 (07/29 1240) Pulse Rate: 100 (07/29 1240)  Labs: Recent Labs    06/13/22 2029 06/13/22 2327 06/13/22 2348 06/14/22 0209  HGB 10.0*  --  9.9* 9.6*  HCT 32.5*  --  29.0* 30.0*  PLT 167  --   --  139*  LABPROT 24.7*  --   --  26.8*  INR 2.3*  --   --  2.5*  CREATININE 1.25*  --  1.10* 1.15*  TROPONINIHS  --  14  --  36*    Estimated Creatinine Clearance: 24.5 mL/min (A) (by C-G formula based on SCr of 1.15 mg/dL (H)).   Medical History: Past Medical History:  Diagnosis Date   Anemia    Aortic insufficiency    Chest pain    Chronic diarrhea    DNR (do not resuscitate)    Dysrhythmia    afib   Falling    History of GI bleed    History of shingles    Long term current use of anticoagulant    Mitral regurgitation    Orthostatic hypotension    Persistent atrial fibrillation (HCC)    on Coumadin   Pleural effusion    Sinus node dysfunction (HCC)    a. s/p Biotronik PPM.   Stroke (HCC)    Tricuspid regurgitation     Medications:  Medications Prior to Admission  Medication Sig Dispense Refill Last Dose   acetaminophen (TYLENOL) 500 MG tablet Take 1,000 mg by mouth in the morning and at bedtime.      alendronate (FOSAMAX) 70 MG tablet Take 70 mg by  mouth once a week. Sunday      Camphor-Menthol-Methyl Sal (SALONPAS) 3.11-22-08 % PTCH Apply 1 patch topically daily as needed (pain).      cholecalciferol (VITAMIN D3) 25 MCG (1000 UNIT) tablet Take 1,000 Units by mouth daily.      cholestyramine (QUESTRAN) 4 g packet Take 2 g by mouth daily as needed (Diarrhea). Half pack daily      digoxin (LANOXIN) 0.125 MG tablet Take 0.5 tablets (62.5 mcg total) by mouth daily. 7 tablet 0    enoxaparin (LOVENOX) 40 MG/0.4ML injection Inject 40 mg into the skin daily.      fluticasone (FLONASE) 50 MCG/ACT nasal spray Place 1 spray into both nostrils daily as needed for allergies.      furosemide (LASIX) 80 MG tablet Take 120 mg by mouth daily.      furosemide (LASIX) 80 MG tablet Take 1 tablet (80 mg total) by mouth daily. 90 tablet 3    hydroxypropyl methylcellulose / hypromellose (ISOPTO TEARS / GONIOVISC) 2.5 % ophthalmic solution Place 1 drop into both eyes 3 (three) times daily as needed for dry eyes.  loperamide (IMODIUM) 2 MG capsule Take 4 mg by mouth daily.      loratadine (CLARITIN) 10 MG tablet Take 10 mg by mouth daily.      Menthol, Topical Analgesic, (BIOFREEZE EX) Apply 1 application topically daily as needed (pain).      metoprolol succinate (TOPROL-XL) 100 MG 24 hr tablet Take 1 tablet (100 mg total) by mouth daily. Pt. Need to schedule appt. With Cardiologist in order to receive future refills. Thank You. 2nd Attempt. 15 tablet 0    nitroGLYCERIN (NITROSTAT) 0.4 MG SL tablet Place 1 tablet (0.4 mg total) under the tongue every 5 (five) minutes as needed for chest pain. 25 tablet 6    pantoprazole (PROTONIX) 40 MG tablet Take 1 tablet (40 mg total) by mouth 2 (two) times daily. 180 tablet 0    potassium chloride SA (KLOR-CON M20) 20 MEQ tablet Take 1 tablet (20 mEq total) by mouth daily. 15 tablet 0    sucralfate (CARAFATE) 1 GM/10ML suspension Take 10 mLs (1 g total) by mouth 2 (two) times daily. 600 mL 0    warfarin (COUMADIN) 5 MG tablet  Take 2.5 mg by mouth daily.       Assessment: 89 YOF admitted due to syncope and collapse (blacked out and fell while completing a routine task. Coumadin consult ordered  Goal of Therapy:  INR 2-3 Monitor platelets by anticoagulation protocol: Yes   Plan:  Warfarin 2.5 mg po x1 today Follow-up on completed med rec and verify home coumadin dose Daily INR, CBC   Pami Wool BS, PharmD, BCPS Clinical Pharmacist 06/14/2022 3:36 PM  Contact: [phone] after 3 PM  "Be curious, not judgmental..." -Debbora Dus

## 2022-06-14 NOTE — TOC CAGE-AID Note (Signed)
Transition of Care [facility]) - CAGE-AID Screening   Patient Details  Name: Carol Snyder MRN: [id number] Date of Birth: [birth year]  [facility]) CM/SW Contact:    Katha Hamming, RN Phone [phone] 06/14/2022, 12:45 AM   Clinical Narrative:  Presents after a fall resulting in posterior head injury. Reports no drug/alcohol use, no resources are indicated.  CAGE-AID Screening:    Have You Ever Felt You Ought to Cut Down on Your Drinking or Drug Use?: No Have People Annoyed You By Critizing Your Drinking Or Drug Use?: No Have You Felt Bad Or Guilty About Your Drinking Or Drug Use?: No Have You Ever Had a Drink or Used Drugs First Thing In The Morning to Steady Your Nerves or to Get Rid of a Hangover?: No CAGE-AID Score: 0  Substance Abuse Education Offered: No

## 2022-06-14 NOTE — Assessment & Plan Note (Signed)
-  Last echocardiogram in 06/2019 with EF of 25 to 30%.  Severe akinesis of the entire inferior wall of the left ventricle.  Moderate thickening of the mitral valve. -Has already received 1 L of normal saline in the ED.  We will hold on any further fluids at this time. -Obtain echocardiogram as noted above

## 2022-06-14 NOTE — Assessment & Plan Note (Addendum)
Patient unable to provide much history regarding her fall other than that she believes she had loss of consciousness prior. -She also had new slight ST depression and T wave inversion to her inferior lateral leads.  Cardiology was consulted by EDP and thought her EKG changes could be related to digoxin toxicity.  Asked that digoxin be held and to obtain echocardiogram. However daughter is in charge of her medications and her last dose was Thursday. -She does have dual-chamber pacemaker--will need to interrogate as well -keep on continuous telemetry -troponin is pending

## 2022-06-14 NOTE — Progress Notes (Signed)
ANTICOAGULATION CONSULT NOTE - Initial Consult  Pharmacy Consult for Warfarin  Indication: atrial fibrillation  Allergies  Allergen Reactions   Codeine Other (See Comments)    unknown   Enbrel [Etanercept] Other (See Comments)    unknown   Hydromorphone Other (See Comments)    unknown   Motrin [Ibuprofen] Other (See Comments)    Due to blood thinner    Sulfa Antibiotics Other (See Comments)    Childhood allergy    Voltaren [Diclofenac] Other (See Comments)    unknown   Naproxen Sodium Palpitations    Patient Measurements: Height: 5\' 7"  (170.2 cm) Weight: 45.4 kg (100 lb) IBW/kg (Calculated) : 61.6 Vital Signs: Temp: 98.1 F (36.7 C) (07/29 0022) Temp Source: Oral (07/29 0022) BP: 129/78 (07/29 0100) Pulse Rate: 104 (07/29 0111)  Labs: Recent Labs    06/13/22 2029 06/13/22 2327 06/13/22 2348  HGB 10.0*  --  9.9*  HCT 32.5*  --  29.0*  PLT 167  --   --   LABPROT 24.7*  --   --   INR 2.3*  --   --   CREATININE 1.25*  --  1.10*  TROPONINIHS  --  14  --     Estimated Creatinine Clearance: 24.4 mL/min (A) (by C-G formula based on SCr of 1.1 mg/dL (H)).   Medical History: Past Medical History:  Diagnosis Date   Anemia    Aortic insufficiency    Chest pain    Chronic diarrhea    DNR (do not resuscitate)    Dysrhythmia    afib   Falling    History of GI bleed    History of shingles    Long term current use of anticoagulant    Mitral regurgitation    Orthostatic hypotension    Persistent atrial fibrillation (HCC)    on Coumadin   Pleural effusion    Sinus node dysfunction (HCC)    a. s/p Biotronik PPM.   Stroke [facility])    Tricuspid regurgitation       Assessment: 88 y/o F on warfarin PTA for afib. Presented to the ED with fall. CT head negative for bleed. Continuing warfarin. INR 2.3 this past evening.   Goal of Therapy:  INR 2-3 Monitor platelets by anticoagulation protocol: Yes   Plan:  INR with AM labs to assess dosing needs Confirm PTA  warfarin dose later today  95, PharmD, BCPS Clinical Pharmacist Phone: [phone]

## 2022-06-14 NOTE — Progress Notes (Signed)
ANTICOAGULATION CONSULT NOTE - Initial Consult  Pharmacy Consult for Warfarin  Indication: atrial fibrillation  Allergies  Allergen Reactions   Codeine Other (See Comments)    unknown   Enbrel [Etanercept] Other (See Comments)    unknown   Hydromorphone Other (See Comments)    unknown   Motrin [Ibuprofen] Other (See Comments)    Due to blood thinner    Sulfa Antibiotics Other (See Comments)    Childhood allergy    Voltaren [Diclofenac] Other (See Comments)    unknown   Naproxen Sodium Palpitations    Patient Measurements: Height: 5\' 8"  (172.7 cm) Weight: 47.8 kg (105 lb 4.8 oz) IBW/kg (Calculated) : 63.9 Vital Signs: Temp: 97.8 F (36.6 C) (07/29 1240) Temp Source: Oral (07/29 1240) BP: 110/67 (07/29 1240) Pulse Rate: 100 (07/29 1240)  Labs: Recent Labs    06/13/22 2029 06/13/22 2327 06/13/22 2348 06/14/22 0209  HGB 10.0*  --  9.9* 9.6*  HCT 32.5*  --  29.0* 30.0*  PLT 167  --   --  139*  LABPROT 24.7*  --   --  26.8*  INR 2.3*  --   --  2.5*  CREATININE 1.25*  --  1.10* 1.15*  TROPONINIHS  --  14  --  36*     Estimated Creatinine Clearance: 24.5 mL/min (A) (by C-G formula based on SCr of 1.15 mg/dL (H)).   Medical History: Past Medical History:  Diagnosis Date   Anemia    Aortic insufficiency    Chest pain    Chronic diarrhea    DNR (do not resuscitate)    Dysrhythmia    afib   Falling    History of GI bleed    History of shingles    Long term current use of anticoagulant    Mitral regurgitation    Orthostatic hypotension    Persistent atrial fibrillation (HCC)    on Coumadin   Pleural effusion    Sinus node dysfunction (HCC)    a. s/p Biotronik PPM.   Stroke [facility])    Tricuspid regurgitation       Assessment: 88 y/o F on warfarin PTA for afib. Presented to the ED with fall. CT head negative for bleed. Continuing warfarin. INR 2.3 on admission. Last dose was 7/27. Patient's daughter reports home repeating regimen of 2.5mg , 2.5mg , and  5mg  every 3 days.   INR therapeutic at 2.5. CBC stable since admission. No documented signs/symptoms of bleeding.  Goal of Therapy:  INR 2-3 Monitor platelets by anticoagulation protocol: Yes   Plan:  Give warfarin 5mg  tonight  Monitor INR, CBC, signs/symptoms of bleeding daily   8/27, PharmD PGY1 Pharmacy Resident  7/29/20233:37 PM

## 2022-06-14 NOTE — Progress Notes (Signed)
PROGRESS NOTE    Carol Snyder  ERX:[id number] DOB: 09/17/1929 DOA: 06/13/2022 PCP: Kaleen Mask, MD   Brief Narrative:  Carol Snyder is a 88 y.o. female with medical history significant of ischemic cardiomyopathy, tachybradycardia syndrome with pacemaker, chronic atrial fibrillation on Coumadin, CVA, dementia who presents following a fall/syncopal event (recurrent).  Assessment & Plan:   Principal Problem:   Syncope and collapse Active Problems:   Atrial fibrillation, chronic (HCC)   Fall   Pacemaker   Scalp hematoma, initial encounter   Chronic systolic CHF (congestive heart failure) (HCC)   Sacral pain   Syncope and collapse -Recurrent event, appears to be orthostatic in nature given patient's symptoms typically aligned with ambulating after seating  -ED discussed with cardiology: EKG changes could be related to digoxin toxicity. Asked that digoxin be held and to obtain echocardiogram.  Formal cardiology consultation pending any abnormal findings on echo. -Pacemaker evaluated without incident or findings -Continue to follow clinically echocardiogram pending per cardiology -CT head C-spine and pelvis x-ray unremarkable for acute findings or fracture  Chronic systolic CHF (congestive heart failure) (HCC) -Last echocardiogram in 06/2019 with EF of 25 to 30%.  Severe akinesis of the entire inferior wall of the left ventricle.  Moderate thickening of the mitral valve. -Echo pending per cardiology  Ambulatory dysfunction, fall, traumatic scalp hematoma, initial encounter Bleeding has been controlled with multiple staple at the scalp.   Atrial fibrillation, chronic (HCC) INR is therapeutic at 2.8. -Continue warfarin with dosing per pharmacy   DVT prophylaxis: Warfarin Code Status: DNR Family Communication: Daughter at bedside  Status is: Inpatient  Dispo: The patient is from: Home              Anticipated d/c is to: Home              Anticipated d/c date is: 24 to 48  hours              Patient currently not medically stable for discharge  Consultants:  None, cardiology sidelined in the ED this was not a formal consult  Procedures:  None  Antimicrobials:  None  Subjective: No acute issues or events overnight, denies nausea vomiting diarrhea constipation headache fevers chills or chest pain  Objective: Vitals:   06/14/22 0111 06/14/22 0203 06/14/22 0207 06/14/22 0423  BP:  123/88  109/71  Pulse: (!) 104 95  (!) 110  Resp: 18 (!) 23  16  Temp:  98.3 F (36.8 C)  (!) 97.5 F (36.4 C)  TempSrc:  Oral  Oral  SpO2: 96% 94%  96%  Weight:   47.8 kg   Height:   5\' 8"  (1.727 m)     Intake/Output Summary (Last 24 hours) at 06/14/2022 0758 Last data filed at 06/14/2022 0426 Gross per 24 hour  Intake --  Output 0 ml  Net 0 ml   Filed Weights   06/13/22 2031 06/14/22 0207  Weight: 45.4 kg 47.8 kg    Examination:  General:  Pleasantly resting in bed, No acute distress. Neck:  Without mass or deformity.  Trachea is midline. Lungs:  Clear to auscultate bilaterally without rhonchi, wheeze, or rales. Heart:  Regular rate and rhythm.  Without murmurs, rubs, or gallops. Abdomen:  Soft, nontender, nondistended.  Without guarding or rebound. Extremities: Without cyanosis, clubbing, edema, or obvious deformity. Vascular:  Dorsalis pedis and posterior tibial pulses palpable bilaterally. Skin: Right posterior scalp laceration with staple noted   Data Reviewed: I have personally reviewed  following labs and imaging studies  CBC: Recent Labs  Lab 06/13/22 2029 06/13/22 2348 06/14/22 0209  WBC 8.3  --  11.5*  HGB 10.0* 9.9* 9.6*  HCT 32.5* 29.0* 30.0*  MCV 95.9  --  94.3  PLT 167  --  139*   Basic Metabolic Panel: Recent Labs  Lab 06/13/22 2029 06/13/22 2348 06/14/22 0209  NA 140 138 139  K 4.7 4.8 4.7  CL 106 106 109  CO2 26  --  22  GLUCOSE 106* 124* 132*  BUN 25* 28* 24*  CREATININE 1.25* 1.10* 1.15*  CALCIUM 9.6  --  9.2    GFR: Estimated Creatinine Clearance: 24.5 mL/min (A) (by C-G formula based on SCr of 1.15 mg/dL (H)). Liver Function Tests: Recent Labs  Lab 06/13/22 2029  AST 20  ALT 16  ALKPHOS 58  BILITOT 0.4  PROT 7.1  ALBUMIN 4.2   No results for input(s): "LIPASE", "AMYLASE" in the last 168 hours. No results for input(s): "AMMONIA" in the last 168 hours. Coagulation Profile: Recent Labs  Lab 06/13/22 2029 06/14/22 0209  INR 2.3* 2.5*   Cardiac Enzymes: No results for input(s): "CKTOTAL", "CKMB", "CKMBINDEX", "TROPONINI" in the last 168 hours. BNP (last 3 results) No results for input(s): "PROBNP" in the last 8760 hours. HbA1C: No results for input(s): "HGBA1C" in the last 72 hours. CBG: No results for input(s): "GLUCAP" in the last 168 hours. Lipid Profile: No results for input(s): "CHOL", "HDL", "LDLCALC", "TRIG", "CHOLHDL", "LDLDIRECT" in the last 72 hours. Thyroid Function Tests: No results for input(s): "TSH", "T4TOTAL", "FREET4", "T3FREE", "THYROIDAB" in the last 72 hours. Anemia Panel: No results for input(s): "VITAMINB12", "FOLATE", "FERRITIN", "TIBC", "IRON", "RETICCTPCT" in the last 72 hours. Sepsis Labs: Recent Labs  Lab 06/13/22 2340  LATICACIDVEN 2.2*    No results found for this or any previous visit (from the past 240 hour(s)).       Radiology Studies: DG Pelvis Portable  Result Date: 06/14/2022 CLINICAL DATA:  Recent fall with pelvic pain, initial encounter EXAM: PORTABLE PELVIS 1-2 VIEWS COMPARISON:  None Available. FINDINGS: Left hip prosthesis is noted. Pelvic ring appears intact. Significant degenerative changes of the right hip joint are noted. No acute fracture is seen. No soft tissue abnormality is noted. IMPRESSION: Chronic degenerative changes of the right hip joint. No acute fracture is seen. Electronically Signed   By: Alcide Clever M.D.   On: 06/14/2022 01:20   CT CERVICAL SPINE WO CONTRAST  Result Date: 06/13/2022 CLINICAL DATA:  Recent  fall with neck pain, initial encounter EXAM: CT CERVICAL SPINE WITHOUT CONTRAST TECHNIQUE: Multidetector CT imaging of the cervical spine was performed without intravenous contrast. Multiplanar CT image reconstructions were also generated. RADIATION DOSE REDUCTION: This exam was performed according to the departmental dose-optimization program which includes automated exposure control, adjustment of the mA and/or kV according to patient size and/or use of iterative reconstruction technique. COMPARISON:  04/09/2022 FINDINGS: Alignment: Stable in appearance from the prior exam. Mild anterolisthesis of C2 on C3 is noted with retrolisthesis of C3 on C4 and C4 on C5. Skull base and vertebrae: 7 cervical segments are well visualized. Vertebral body height is well maintained. Disc space narrowing is noted from C3-C6. Facet hypertrophic changes are noted as well as osteophytic changes. No acute fracture or acute facet abnormality is noted. The odontoid is within normal limits. Soft tissues and spinal canal: Surrounding soft tissue structures are within normal limits. Some air is noted within the jugular venous system likely related  to prior IV initiation. Upper chest: No acute rib abnormality is noted. Small right-sided pleural effusion is seen. No focal confluent infiltrate is noted. Some apical scarring is noted bilaterally. Other: None IMPRESSION: Degenerative changes similar to that seen on prior exam. No acute bony abnormality is seen. Electronically Signed   By: Alcide Clever M.D.   On: 06/13/2022 21:22   CT HEAD WO CONTRAST  Result Date: 06/13/2022 CLINICAL DATA:  Trauma. EXAM: CT HEAD WITHOUT CONTRAST TECHNIQUE: Contiguous axial images were obtained from the base of the skull through the vertex without intravenous contrast. RADIATION DOSE REDUCTION: This exam was performed according to the departmental dose-optimization program which includes automated exposure control, adjustment of the mA and/or kV according to  patient size and/or use of iterative reconstruction technique. COMPARISON:  Head CT dated 06/05/2018. FINDINGS: Brain: Mild age-related atrophy and chronic microvascular ischemic changes. Small right basal ganglia old lacunar infarct. There is no acute intracranial hemorrhage. No mass effect or midline shift. No extra-axial fluid collection. Vascular: No hyperdense vessel or unexpected calcification. Skull: Normal. Negative for fracture or focal lesion. Sinuses/Orbits: There is partial opacification of the left maxillary sinus with air-fluid level. The remainder of the visualized paranasal sinuses and mastoid air cells are clear. Other: Right posterior parietal scalp laceration. A 4 mm radiopaque focus in the scalp adjacent to the laceration noted which is new since the CT of 2019. Clinical correlation recommended to evaluate for possibility of foreign object. IMPRESSION: 1. No acute intracranial pathology. 2. Mild age-related atrophy and chronic microvascular ischemic changes. 3. Right parietal scalp laceration and possible foreign fragment. Electronically Signed   By: Elgie Collard M.D.   On: 06/13/2022 21:07   DG Chest Port 1 View  Result Date: 06/13/2022 CLINICAL DATA:  Recent fall with chest pain, initial encounter EXAM: PORTABLE CHEST 1 VIEW COMPARISON:  04/11/2022 FINDINGS: Cardiac shadow is enlarged but stable. Pacing device is again seen. Aortic calcifications are noted. The lungs are well aerated bilaterally. Mild right basilar atelectasis is seen. No pneumothorax is noted. Old healed rib fracture of the right tenth rib posteriorly is noted and stable. IMPRESSION: Mild right basilar atelectasis. Stable cardiomegaly. Electronically Signed   By: Alcide Clever M.D.   On: 06/13/2022 20:55    Scheduled Meds:  acetaminophen  650 mg Oral Once   feeding supplement  237 mL Oral BID BM    LOS: 0 days   Time spent:  Azucena Fallen, DO [facility]  If 7PM-7AM, please contact  night-coverage [email or web address]  06/14/2022, 7:58 AM

## 2022-06-15 ENCOUNTER — Observation Stay (HOSPITAL_COMMUNITY): Payer: Medicare PPO

## 2022-06-15 DIAGNOSIS — R55 Syncope and collapse: Secondary | ICD-10-CM | POA: Diagnosis not present

## 2022-06-15 LAB — BASIC METABOLIC PANEL
Anion gap: 6 (ref 5–15)
BUN: 23 mg/dL (ref 8–23)
CO2: 27 mmol/L (ref 22–32)
Calcium: 9.1 mg/dL (ref 8.9–10.3)
Chloride: 105 mmol/L (ref 98–111)
Creatinine, Ser: 1.28 mg/dL — ABNORMAL HIGH (ref 0.44–1.00)
GFR, Estimated: 40 mL/min — ABNORMAL LOW (ref >60)
Glucose, Bld: 110 mg/dL — ABNORMAL HIGH (ref 70–99)
Potassium: 5 mmol/L (ref 3.5–5.1)
Sodium: 138 mmol/L (ref 135–145)

## 2022-06-15 LAB — CBC
HCT: 28.3 % — ABNORMAL LOW (ref 36.0–46.0)
Hemoglobin: 9 g/dL — ABNORMAL LOW (ref 12.0–15.0)
MCH: 29.6 pg (ref 26.0–34.0)
MCHC: 31.8 g/dL (ref 30.0–36.0)
MCV: 93.1 fL (ref 80.0–100.0)
Platelets: 141 10*3/uL — ABNORMAL LOW (ref 150–400)
RBC: 3.04 MIL/uL — ABNORMAL LOW (ref 3.87–5.11)
RDW: 13.3 % (ref 11.5–15.5)
WBC: 8.1 10*3/uL (ref 4.0–10.5)
nRBC: 0 % (ref 0.0–0.2)

## 2022-06-15 LAB — D-DIMER, QUANTITATIVE: D-Dimer, Quant: 4.39 ug/mL-FEU — ABNORMAL HIGH (ref 0.00–0.50)

## 2022-06-15 LAB — PROTIME-INR
INR: 2.2 — ABNORMAL HIGH (ref 0.8–1.2)
Prothrombin Time: 24.2 seconds — ABNORMAL HIGH (ref 11.4–15.2)

## 2022-06-15 MED ORDER — WARFARIN SODIUM 2.5 MG PO TABS
2.5000 mg | ORAL_TABLET | Freq: Once | ORAL | Status: AC
Start: 2022-06-15 — End: 2022-06-15
  Administered 2022-06-15: 2.5 mg via ORAL
  Filled 2022-06-15: qty 1

## 2022-06-15 MED ORDER — DIGOXIN 125 MCG PO TABS
0.0625 mg | ORAL_TABLET | Freq: Once | ORAL | Status: AC
Start: 2022-06-15 — End: 2022-06-15
  Administered 2022-06-15: 0.0625 mg via ORAL
  Filled 2022-06-15: qty 1

## 2022-06-15 MED ORDER — IOHEXOL 350 MG/ML SOLN
75.0000 mL | Freq: Once | INTRAVENOUS | Status: AC | PRN
  Administered 2022-06-15: 75 mL via INTRAVENOUS

## 2022-06-15 NOTE — Discharge Summary (Deleted)
Physician Discharge Summary  ARLETHA MARSCHKE ZOX:[id number] DOB: 05/21/1929 DOA: 06/13/2022  PCP: Kaleen Mask, MD  Admit date: 06/13/2022 Discharge date: 06/15/2022  Admitted From: Home Disposition:  Home  Recommendations for Outpatient Follow-up:  Follow up with PCP in 1-2 weeks Follow up with outpatient cardiology as scheduled  Home Health:None  Equipment/Devices:None  Discharge Condition:Stable  CODE STATUS:DNR  Diet recommendation: Regular diet as tolerated   Brief/Interim Summary: CHARMAIN DIOSDADO is a 88 y.o. female with medical history significant of ischemic cardiomyopathy, tachybradycardia syndrome with pacemaker, chronic atrial fibrillation on Coumadin, CVA, dementia who presents following a fall/syncopal event (recurrent).  Patient admitted as above with episode of near syncope versus syncope with collapse, she has prodrome prior to episode likely secondary to orthostatic hypotension.  Her pacemaker was evaluated and found to be without any events recorded. Her blood pressure did drop 15-20 points on orthostatics initially, improving with increased p.o. intake IV fluids and discontinuation of home metoprolol.  Digoxin was also paused at intake due to questionable EKG findings after cardiology was called they agreed with cessation of metoprolol and continuation of digoxin at discharge.  During patient's hospitalization she was noted to be somewhat hypoxic imaging revealed minor pulmonary effusion, D-dimer remained elevated and given abnormal echocardiogram concerning for elevated right sided elevated pressures a CTA was performed to rule out possible subclinical PE.  Fortunately patient's imaging was negative.  At this time she is otherwise stable and agreeable for discharge home, close follow-up with PCP and her primary cardiologist in the outpatient setting as discussed.    Syncope and collapse -Negative imaging, echocardiogram showed right-sided elevated pressures PE ruled  out, metoprolol discontinued concerning for polypharmacy due to patient's advanced age.  Orthostatics questionably positive and improving during hospitalization.   Chronic systolic CHF (congestive heart failure) (HCC) -Echo shows elevated right ventricular pressures, PE ruled out -Otherwise not in acute exacerbation   Ambulatory dysfunction, fall, traumatic scalp hematoma, initial encounter Bleeding has been controlled with multiple staple at the scalp -stable will need to be removed at PCPs office later this week   Atrial fibrillation, chronic (HCC) Continue warfarin, INR therapeutic during hospitalization  Discharge Diagnoses:  Principal Problem:   Syncope and collapse Active Problems:   Atrial fibrillation, chronic (HCC)   Fall   Pacemaker   Scalp hematoma, initial encounter   Chronic systolic CHF (congestive heart failure) (HCC)   Sacral pain    Discharge Instructions   Allergies as of 06/15/2022       Reactions   Codeine Other (See Comments)   unknown   Enbrel [etanercept] Other (See Comments)   unknown   Hydromorphone Other (See Comments)   unknown   Motrin [ibuprofen] Other (See Comments)   Due to blood thinner    Sulfa Antibiotics Other (See Comments)   Childhood allergy    Voltaren [diclofenac] Other (See Comments)   unknown   Naproxen Sodium Palpitations        Medication List     STOP taking these medications    metoprolol succinate 100 MG 24 hr tablet Commonly known as: TOPROL-XL       TAKE these medications    acetaminophen 500 MG tablet Commonly known as: TYLENOL Take 1,000 mg by mouth in the morning and at bedtime.   alendronate 70 MG tablet Commonly known as: FOSAMAX Take 70 mg by mouth once a week. Sundays   BIOFREEZE EX Apply 1 application topically daily as needed (pain).   cholestyramine 4 g packet  Commonly known as: QUESTRAN Take 2 g by mouth daily as needed (Diarrhea). Half pack daily   digoxin 0.125 MG tablet Commonly  known as: LANOXIN Take 0.5 tablets (62.5 mcg total) by mouth daily. What changed: when to take this   fluticasone 50 MCG/ACT nasal spray Commonly known as: FLONASE Place 1 spray into both nostrils daily as needed for allergies.   furosemide 80 MG tablet Commonly known as: LASIX Take 1 tablet (80 mg total) by mouth daily. What changed:  how much to take Another medication with the same name was removed. Continue taking this medication, and follow the directions you see here.   hydroxypropyl methylcellulose / hypromellose 2.5 % ophthalmic solution Commonly known as: ISOPTO TEARS / GONIOVISC Place 1 drop into both eyes 3 (three) times daily as needed for dry eyes.   loperamide 2 MG capsule Commonly known as: IMODIUM Take 4 mg by mouth daily.   loratadine 10 MG tablet Commonly known as: CLARITIN Take 10 mg by mouth daily.   nitroGLYCERIN 0.4 MG SL tablet Commonly known as: NITROSTAT Place 1 tablet (0.4 mg total) under the tongue every 5 (five) minutes as needed for chest pain.   pantoprazole 40 MG tablet Commonly known as: Protonix Take 1 tablet (40 mg total) by mouth 2 (two) times daily. What changed: when to take this   potassium chloride SA 20 MEQ tablet Commonly known as: Klor-Con M20 Take 1 tablet (20 mEq total) by mouth daily.   Salonpas 3.11-22-08 % Ptch Generic drug: Camphor-Menthol-Methyl Sal Apply 1 patch topically daily as needed (pain).   sucralfate 1 GM/10ML suspension Commonly known as: Carafate Take 10 mLs (1 g total) by mouth 2 (two) times daily. What changed: when to take this   warfarin 5 MG tablet Commonly known as: COUMADIN Take 2.5-5 mg by mouth daily. Take 2.5 mg tablet by mouth Sun, Tues, Wed, Fri then take whole 5 mg tablet by moth rest of days per daughter        Allergies  Allergen Reactions   Codeine Other (See Comments)    unknown   Enbrel [Etanercept] Other (See Comments)    unknown   Hydromorphone Other (See Comments)    unknown    Motrin [Ibuprofen] Other (See Comments)    Due to blood thinner    Sulfa Antibiotics Other (See Comments)    Childhood allergy    Voltaren [Diclofenac] Other (See Comments)    unknown   Naproxen Sodium Palpitations    Consultations: None   Procedures/Studies: CT Angio Chest Pulmonary Embolism (PE) W or WO Contrast  Result Date: 06/15/2022 CLINICAL DATA:  A 87 year old female presents for evaluation of the chest, suspected pulmonary embolism in the setting of positive D-dimer. EXAM: CT ANGIOGRAPHY CHEST WITH CONTRAST TECHNIQUE: Multidetector CT imaging of the chest was performed using the standard protocol during bolus administration of intravenous contrast. Multiplanar CT image reconstructions and MIPs were obtained to evaluate the vascular anatomy. RADIATION DOSE REDUCTION: This exam was performed according to the departmental dose-optimization program which includes automated exposure control, adjustment of the mA and/or kV according to patient size and/or use of iterative reconstruction technique. CONTRAST:  71mL OMNIPAQUE IOHEXOL 350 MG/ML SOLN COMPARISON:  Apr 09, 2022 FINDINGS: Cardiovascular: Calcified aortic atherosclerotic changes. Aorta not well assessed due to bolus timing on study optimized for assessment of pulmonary vasculature. Heart size is markedly enlarged mainly with RIGHT heart enlargement and LEFT atrial enlargement. No pericardial effusion or signs of pericardial nodularity. Main pulmonary artery is  opacified to 506 Hounsfield units. The study is negative for pulmonary embolism. Mediastinum/Nodes: Patulous esophagus. No thoracic inlet adenopathy. No axillary lymphadenopathy. No mediastinal or hilar lymphadenopathy. Lungs/Pleura: Moderate RIGHT and trace LEFT pleural fluid collections layering dependently in the chest increased pleural fluid on the RIGHT slightly since previous imaging and development of LEFT-sided pleural effusion since previous imaging from March of 2023. No  pneumothorax. Basilar volume loss associated with pleural fluid. No gross signs of pulmonary edema. Airways are patent. Upper Abdomen: Reflux of contrast into hepatic veins. No acute upper abdominal process on very limited assessment. Musculoskeletal: Spinal degenerative changes. Osteopenia. Unchanged lower thoracic and upper lumbar compression fractures of superior endplates of T12 and L1. similar endplate compression of T8 as well. Review of the MIP images confirms the above findings. IMPRESSION: 1. Negative for pulmonary embolism. 2. Signs of cardiac dysfunction with reflux of contrast into hepatic veins and cardiac enlargement mainly LEFT atrium and RIGHT heart. 3. Increasing pleural fluid collections. No gross pulmonary edema at this time. Would however still correlate with signs of heart failure. 4. Compression fractures in the spine with similar appearance. 5. Patulous esophagus, correlate with any signs of esophageal dysmotility. Aortic Atherosclerosis (ICD10-I70.0). Electronically Signed   By: Donzetta Kohut M.D.   On: 06/15/2022 12:30   ECHOCARDIOGRAM COMPLETE  Result Date: 06/14/2022    ECHOCARDIOGRAM REPORT   Patient Name:   ANSLEY MANGIAPANE Date of Exam: 06/14/2022 Medical Rec #:  [id number]   Height:       68.0 in Accession #:    [id number]  Weight:       105.3 lb Date of Birth:  [birth year]   BSA:          1.556 m Patient Age:    88 years    BP:           101/51 mmHg Patient Gender: F           HR:           102 bpm. Exam Location:  Inpatient Procedure: 2D Echo Indications:    syncope  History:        Patient has prior history of Echocardiogram examinations, most                 recent 07/05/2019. CHF, Pacemaker; Arrythmias:Atrial                 Fibrillation.  Sonographer:    Delcie Roch RDCS Referring Phys: [id number] CHING T TU IMPRESSIONS  1. Left ventricular ejection fraction, by estimation, is 55 to 60%. The left ventricle has normal function. The left ventricle has no regional wall motion  abnormalities. There is mild left ventricular hypertrophy. Left ventricular diastolic parameters are indeterminate. There is the interventricular septum is flattened in diastole ('D' shaped left ventricle), consistent with right ventricular volume overload.  2. Right ventricular systolic function is moderately reduced. The right ventricular size is moderately enlarged. There is moderately elevated pulmonary artery systolic pressure.  3. Left atrial size was severely dilated.  4. Right atrial size was severely dilated.  5. Mild to moderate mitral valve regurgitation.  6. Tricuspid valve regurgitation is mild to moderate.  7. Likely did not capture highest mean gradient. The aortic valve is calcified. Aortic valve regurgitation is mild. Mild to moderate aortic valve stenosis.  8. The inferior vena cava is dilated in size with <50% respiratory variability, suggesting right atrial pressure of 15 mmHg. Conclusion(s)/Recommendation(s): Compared to prior echo, there is more pronounced septal  flattening would r/o pulmonary embolism. Otherwise EF has improved. FINDINGS  Left Ventricle: Left ventricular ejection fraction, by estimation, is 55 to 60%. The left ventricle has normal function. The left ventricle has no regional wall motion abnormalities. The left ventricular internal cavity size was normal in size. There is  mild left ventricular hypertrophy. The interventricular septum is flattened in diastole ('D' shaped left ventricle), consistent with right ventricular volume overload. Left ventricular diastolic parameters are indeterminate. Right Ventricle: The right ventricular size is moderately enlarged. Right ventricular systolic function is moderately reduced. There is moderately elevated pulmonary artery systolic pressure. The tricuspid regurgitant velocity is 2.97 m/s, and with an assumed right atrial pressure of 15 mmHg, the estimated right ventricular systolic pressure is 50.3 mmHg. Left Atrium: Left atrial size was  severely dilated. Right Atrium: Right atrial size was severely dilated. Pericardium: The pericardium was not well visualized. Mitral Valve: Mild to moderate mitral annular calcification. Mild to moderate mitral valve regurgitation. Tricuspid Valve: Tricuspid valve regurgitation is mild to moderate. Aortic Valve: Likely did not capture highest mean gradient. The aortic valve is calcified. Aortic valve regurgitation is mild. Mild to moderate aortic stenosis is present. Aortic valve mean gradient measures 9.3 mmHg. Aortic valve peak gradient measures 16.6 mmHg. Aortic valve area, by VTI measures 0.82 cm. Pulmonic Valve: Pulmonic valve regurgitation is trivial. Aorta: The aortic root and ascending aorta are structurally normal, with no evidence of dilitation. Venous: The inferior vena cava is dilated in size with less than 50% respiratory variability, suggesting right atrial pressure of 15 mmHg. Additional Comments: A device lead is visualized.  LEFT VENTRICLE PLAX 2D LVIDd:         4.10 cm LVIDs:         3.00 cm LV PW:         1.10 cm LV IVS:        0.90 cm LVOT diam:     1.80 cm LV SV:         27 LV SV Index:   17 LVOT Area:     2.54 cm  RIGHT VENTRICLE            IVC RV S prime:     8.38 cm/s  IVC diam: 2.10 cm LEFT ATRIUM             Index        RIGHT ATRIUM           Index LA diam:        4.30 cm 2.76 cm/m   RA Area:     34.70 cm LA Vol (A2C):   65.7 ml 42.22 ml/m  RA Volume:   136.00 ml 87.40 ml/m LA Vol (A4C):   74.7 ml 48.01 ml/m LA Biplane Vol: 75.7 ml 48.65 ml/m  AORTIC VALVE AV Area (Vmax):    0.95 cm AV Area (Vmean):   0.91 cm AV Area (VTI):     0.82 cm AV Vmax:           203.67 cm/s AV Vmean:          138.667 cm/s AV VTI:            0.327 m AV Peak Grad:      16.6 mmHg AV Mean Grad:      9.3 mmHg LVOT Vmax:         75.92 cm/s LVOT Vmean:        49.450 cm/s LVOT VTI:  0.105 m LVOT/AV VTI ratio: 0.32  AORTA Ao Root diam: 3.00 cm Ao Asc diam:  2.60 cm TRICUSPID VALVE TR Peak grad:   35.3  mmHg TR Vmax:        297.00 cm/s  SHUNTS Systemic VTI:  0.11 m Systemic Diam: 1.80 cm Carolan Clines Electronically signed by Carolan Clines Signature Date/Time: 06/14/2022/6:14:58 PM    Final    DG Pelvis Portable  Result Date: 06/14/2022 CLINICAL DATA:  Recent fall with pelvic pain, initial encounter EXAM: PORTABLE PELVIS 1-2 VIEWS COMPARISON:  None Available. FINDINGS: Left hip prosthesis is noted. Pelvic ring appears intact. Significant degenerative changes of the right hip joint are noted. No acute fracture is seen. No soft tissue abnormality is noted. IMPRESSION: Chronic degenerative changes of the right hip joint. No acute fracture is seen. Electronically Signed   By: Alcide Clever M.D.   On: 06/14/2022 01:20   CT CERVICAL SPINE WO CONTRAST  Result Date: 06/13/2022 CLINICAL DATA:  Recent fall with neck pain, initial encounter EXAM: CT CERVICAL SPINE WITHOUT CONTRAST TECHNIQUE: Multidetector CT imaging of the cervical spine was performed without intravenous contrast. Multiplanar CT image reconstructions were also generated. RADIATION DOSE REDUCTION: This exam was performed according to the departmental dose-optimization program which includes automated exposure control, adjustment of the mA and/or kV according to patient size and/or use of iterative reconstruction technique. COMPARISON:  04/09/2022 FINDINGS: Alignment: Stable in appearance from the prior exam. Mild anterolisthesis of C2 on C3 is noted with retrolisthesis of C3 on C4 and C4 on C5. Skull base and vertebrae: 7 cervical segments are well visualized. Vertebral body height is well maintained. Disc space narrowing is noted from C3-C6. Facet hypertrophic changes are noted as well as osteophytic changes. No acute fracture or acute facet abnormality is noted. The odontoid is within normal limits. Soft tissues and spinal canal: Surrounding soft tissue structures are within normal limits. Some air is noted within the jugular venous system likely related  to prior IV initiation. Upper chest: No acute rib abnormality is noted. Small right-sided pleural effusion is seen. No focal confluent infiltrate is noted. Some apical scarring is noted bilaterally. Other: None IMPRESSION: Degenerative changes similar to that seen on prior exam. No acute bony abnormality is seen. Electronically Signed   By: Alcide Clever M.D.   On: 06/13/2022 21:22   CT HEAD WO CONTRAST  Result Date: 06/13/2022 CLINICAL DATA:  Trauma. EXAM: CT HEAD WITHOUT CONTRAST TECHNIQUE: Contiguous axial images were obtained from the base of the skull through the vertex without intravenous contrast. RADIATION DOSE REDUCTION: This exam was performed according to the departmental dose-optimization program which includes automated exposure control, adjustment of the mA and/or kV according to patient size and/or use of iterative reconstruction technique. COMPARISON:  Head CT dated 06/05/2018. FINDINGS: Brain: Mild age-related atrophy and chronic microvascular ischemic changes. Small right basal ganglia old lacunar infarct. There is no acute intracranial hemorrhage. No mass effect or midline shift. No extra-axial fluid collection. Vascular: No hyperdense vessel or unexpected calcification. Skull: Normal. Negative for fracture or focal lesion. Sinuses/Orbits: There is partial opacification of the left maxillary sinus with air-fluid level. The remainder of the visualized paranasal sinuses and mastoid air cells are clear. Other: Right posterior parietal scalp laceration. A 4 mm radiopaque focus in the scalp adjacent to the laceration noted which is new since the CT of 2019. Clinical correlation recommended to evaluate for possibility of foreign object. IMPRESSION: 1. No acute intracranial pathology. 2. Mild age-related atrophy and chronic  microvascular ischemic changes. 3. Right parietal scalp laceration and possible foreign fragment. Electronically Signed   By: Elgie Collard M.D.   On: 06/13/2022 21:07   DG  Chest Port 1 View  Result Date: 06/13/2022 CLINICAL DATA:  Recent fall with chest pain, initial encounter EXAM: PORTABLE CHEST 1 VIEW COMPARISON:  04/11/2022 FINDINGS: Cardiac shadow is enlarged but stable. Pacing device is again seen. Aortic calcifications are noted. The lungs are well aerated bilaterally. Mild right basilar atelectasis is seen. No pneumothorax is noted. Old healed rib fracture of the right tenth rib posteriorly is noted and stable. IMPRESSION: Mild right basilar atelectasis. Stable cardiomegaly. Electronically Signed   By: Alcide Clever M.D.   On: 06/13/2022 20:55     Subjective: No acute issues or events overnight   Discharge Exam: Vitals:   06/15/22 1630 06/15/22 1634  BP:  138/90  Pulse: (!) 120 (!) 106  Resp:  20  Temp:  98.5 F (36.9 C)  SpO2:  98%   Vitals:   06/15/22 0756 06/15/22 1230 06/15/22 1630 06/15/22 1634  BP: 96/66 (!) 143/88  138/90  Pulse: 93 100 (!) 120 (!) 106  Resp: 20 (!) 21  20  Temp: 98.1 F (36.7 C)   98.5 F (36.9 C)  TempSrc: Oral   Oral  SpO2: 94% 98%  98%  Weight:      Height:        General: Pt is alert, awake, not in acute distress Cardiovascular: Irregularly irregular without overt murmurs rubs or gallops Respiratory: CTA bilaterally, no wheezing, no rhonchi Abdominal: Soft, NT, ND, bowel sounds + Extremities: no edema, no cyanosis  The results of significant diagnostics from this hospitalization (including imaging, microbiology, ancillary and laboratory) are listed below for reference.     Microbiology: No results found for this or any previous visit (from the past 240 hour(s)).   Labs: BNP (last 3 results) No results for input(s): "BNP" in the last 8760 hours. Basic Metabolic Panel: Recent Labs  Lab 06/13/22 2029 06/13/22 2348 06/14/22 0209 06/15/22 0332  NA 140 138 139 138  K 4.7 4.8 4.7 5.0  CL 106 106 109 105  CO2 26  --  22 27  GLUCOSE 106* 124* 132* 110*  BUN 25* 28* 24* 23  CREATININE 1.25* 1.10*  1.15* 1.28*  CALCIUM 9.6  --  9.2 9.1   Liver Function Tests: Recent Labs  Lab 06/13/22 2029  AST 20  ALT 16  ALKPHOS 58  BILITOT 0.4  PROT 7.1  ALBUMIN 4.2   No results for input(s): "LIPASE", "AMYLASE" in the last 168 hours. No results for input(s): "AMMONIA" in the last 168 hours. CBC: Recent Labs  Lab 06/13/22 2029 06/13/22 2348 06/14/22 0209 06/15/22 0332  WBC 8.3  --  11.5* 8.1  HGB 10.0* 9.9* 9.6* 9.0*  HCT 32.5* 29.0* 30.0* 28.3*  MCV 95.9  --  94.3 93.1  PLT 167  --  139* 141*   Cardiac Enzymes: No results for input(s): "CKTOTAL", "CKMB", "CKMBINDEX", "TROPONINI" in the last 168 hours. BNP: Invalid input(s): "POCBNP" CBG: No results for input(s): "GLUCAP" in the last 168 hours. D-Dimer Recent Labs    06/15/22 0332  DDIMER 4.39*   Hgb A1c No results for input(s): "HGBA1C" in the last 72 hours. Lipid Profile No results for input(s): "CHOL", "HDL", "LDLCALC", "TRIG", "CHOLHDL", "LDLDIRECT" in the last 72 hours. Thyroid function studies No results for input(s): "TSH", "T4TOTAL", "T3FREE", "THYROIDAB" in the last 72 hours.  Invalid input(s): "FREET3"  Anemia work up No results for input(s): "VITAMINB12", "FOLATE", "FERRITIN", "TIBC", "IRON", "RETICCTPCT" in the last 72 hours. Urinalysis    Component Value Date/Time   COLORURINE YELLOW 06/14/2022 0530   APPEARANCEUR CLEAR 06/14/2022 0530   LABSPEC 1.010 06/14/2022 0530   PHURINE 5.0 06/14/2022 0530   GLUCOSEU NEGATIVE 06/14/2022 0530   HGBUR NEGATIVE 06/14/2022 0530   BILIRUBINUR NEGATIVE 06/14/2022 0530   KETONESUR NEGATIVE 06/14/2022 0530   PROTEINUR NEGATIVE 06/14/2022 0530   UROBILINOGEN 0.2 05/15/2007 1442   NITRITE NEGATIVE 06/14/2022 0530   LEUKOCYTESUR MODERATE (A) 06/14/2022 0530   Sepsis Labs Recent Labs  Lab 06/13/22 2029 06/14/22 0209 06/15/22 0332  WBC 8.3 11.5* 8.1   Microbiology No results found for this or any previous visit (from the past 240 hour(s)).   Time  coordinating discharge: Over 30 minutes  SIGNED:   Azucena FallenWilliam C Dnya Hickle, DO [facility] 06/15/2022, 4:48 PM Pager   If 7PM-7AM, please contact night-coverage [email or web address]

## 2022-06-15 NOTE — Progress Notes (Signed)
ANTICOAGULATION CONSULT NOTE  Pharmacy Consult for Warfarin  Indication: atrial fibrillation  Allergies  Allergen Reactions   Codeine Other (See Comments)    unknown   Enbrel [Etanercept] Other (See Comments)    unknown   Hydromorphone Other (See Comments)    unknown   Motrin [Ibuprofen] Other (See Comments)    Due to blood thinner    Sulfa Antibiotics Other (See Comments)    Childhood allergy    Voltaren [Diclofenac] Other (See Comments)    unknown   Naproxen Sodium Palpitations    Patient Measurements: Height: 5\' 8"  (172.7 cm) Weight: 47.8 kg (105 lb 4.8 oz) IBW/kg (Calculated) : 63.9 Vital Signs: Temp: 98.1 F (36.7 C) (07/30 0756) Temp Source: Oral (07/30 0756) BP: 96/66 (07/30 0756) Pulse Rate: 93 (07/30 0756)  Labs: Recent Labs    06/13/22 2029 06/13/22 2327 06/13/22 2348 06/14/22 0209 06/15/22 0332  HGB 10.0*  --  9.9* 9.6* 9.0*  HCT 32.5*  --  29.0* 30.0* 28.3*  PLT 167  --   --  139* 141*  LABPROT 24.7*  --   --  26.8* 24.2*  INR 2.3*  --   --  2.5* 2.2*  CREATININE 1.25*  --  1.10* 1.15* 1.28*  TROPONINIHS  --  14  --  36*  --      Estimated Creatinine Clearance: 22 mL/min (A) (by C-G formula based on SCr of 1.28 mg/dL (H)).   Medical History: Past Medical History:  Diagnosis Date   Anemia    Aortic insufficiency    Chest pain    Chronic diarrhea    DNR (do not resuscitate)    Dysrhythmia    afib   Falling    History of GI bleed    History of shingles    Long term current use of anticoagulant    Mitral regurgitation    Orthostatic hypotension    Persistent atrial fibrillation (HCC)    on Coumadin   Pleural effusion    Sinus node dysfunction (HCC)    a. s/p Biotronik PPM.   Stroke [facility])    Tricuspid regurgitation       Assessment: 88 y/o F on warfarin PTA for afib. Presented to the ED with fall. CT head negative for bleed. Continuing warfarin. INR 2.3 on admission. Patient's daughter reports home repeating regimen of 2.5mg ,  2.5mg , and 5mg  every 3 days.   INR therapeutic at 2.2. hgb slightly down-trending since admission, plts stable. No documented signs/symptoms of bleeding.  Goal of Therapy:  INR 2-3 Monitor platelets by anticoagulation protocol: Yes   Plan:  Give warfarin 2.5mg  tonight  Monitor INR, CBC, signs/symptoms of bleeding daily   95, PharmD PGY1 Pharmacy Resident  7/30/202311:27 AM

## 2022-06-15 NOTE — Progress Notes (Signed)
PROGRESS NOTE    Carol Snyder  IEP:[id number] DOB: November 29, 1928 DOA: 06/13/2022 PCP: Kaleen Mask, MD   Brief Narrative:  Carol Snyder is a 88 y.o. female with medical history significant of ischemic cardiomyopathy, tachybradycardia syndrome with pacemaker, chronic atrial fibrillation on Coumadin, CVA, dementia who presents following a fall/syncopal event (recurrent).  Assessment & Plan:   Principal Problem:   Syncope and collapse Active Problems:   Atrial fibrillation, chronic (HCC)   Fall   Pacemaker   Scalp hematoma, initial encounter   Chronic systolic CHF (congestive heart failure) (HCC)   Sacral pain  Syncope and collapse Orthostatic hypotension -Recurrent event, appears to be orthostatic in nature given patient's symptoms typically aligned with ambulating after seating  -ED discussed with cardiology: EKG changes could be related to digoxin toxicity. Digoxin paused initially - resumed now that heart rate/BP improving -Pacemaker evaluated without incident or findings -Continue to follow clinically echocardiogram pending per cardiology -CT head C-spine and pelvis x-ray unremarkable for acute findings or fracture -Hypotensive/symptomatic today with PT - moderately unstable - follow with repeat PT - may require SNF  Chronic systolic CHF (congestive heart failure) (HCC) -Last echocardiogram in 06/2019 with EF of 25 to 30%.  Severe akinesis of the entire inferior wall of the left ventricle.  Moderate thickening of the mitral valve. -Echo with abnormal with elevated R sided pressure - PE ruled out via CTA.  Ambulatory dysfunction, fall, traumatic scalp hematoma, initial encounter Bleeding has been controlled with multiple staple at the scalp. PT following - unstable today with ambulation - repeat evaluation in the am.   Atrial fibrillation, chronic (HCC) INR is therapeutic at 2.8. -Continue warfarin with dosing per pharmacy   DVT prophylaxis: Warfarin Code Status:  DNR Family Communication: Daughter at bedside  Status is: Inpatient  Dispo: The patient is from: Home              Anticipated d/c is to: Home              Anticipated d/c date is: 24 to 48 hours              Patient currently not medically stable for discharge  Consultants:  None, cardiology sidelined in the ED this was not a formal consult  Procedures:  None  Antimicrobials:  None  Subjective: No acute issues or events overnight, denies nausea vomiting diarrhea constipation headache fevers chills or chest pain, unstable with ambulation today.  Objective: Vitals:   06/15/22 0756 06/15/22 1230 06/15/22 1630 06/15/22 1634  BP: 96/66 (!) 143/88  138/90  Pulse: 93 100 (!) 120 (!) 106  Resp: 20 (!) 21  20  Temp: 98.1 F (36.7 C)   98.5 F (36.9 C)  TempSrc: Oral   Oral  SpO2: 94% 98%  98%  Weight:      Height:       No intake or output data in the 24 hours ending 06/15/22 1717  Filed Weights   06/13/22 2031 06/14/22 0207  Weight: 45.4 kg 47.8 kg    Examination:  General:  Pleasantly resting in bed, No acute distress. Neck:  Without mass or deformity.  Trachea is midline. Lungs:  Clear to auscultate bilaterally without rhonchi, wheeze, or rales. Heart:  Regular rate and rhythm.  Without murmurs, rubs, or gallops. Abdomen:  Soft, nontender, nondistended.  Without guarding or rebound. Extremities: Without cyanosis, clubbing, edema, or obvious deformity. Vascular:  Dorsalis pedis and posterior tibial pulses palpable bilaterally. Skin: Right posterior scalp  laceration with staple noted   Data Reviewed: I have personally reviewed following labs and imaging studies  CBC: Recent Labs  Lab 06/13/22 2029 06/13/22 2348 06/14/22 0209 06/15/22 0332  WBC 8.3  --  11.5* 8.1  HGB 10.0* 9.9* 9.6* 9.0*  HCT 32.5* 29.0* 30.0* 28.3*  MCV 95.9  --  94.3 93.1  PLT 167  --  139* 141*    Basic Metabolic Panel: Recent Labs  Lab 06/13/22 2029 06/13/22 2348 06/14/22 0209  06/15/22 0332  NA 140 138 139 138  K 4.7 4.8 4.7 5.0  CL 106 106 109 105  CO2 26  --  22 27  GLUCOSE 106* 124* 132* 110*  BUN 25* 28* 24* 23  CREATININE 1.25* 1.10* 1.15* 1.28*  CALCIUM 9.6  --  9.2 9.1    GFR: Estimated Creatinine Clearance: 22 mL/min (A) (by C-G formula based on SCr of 1.28 mg/dL (H)). Liver Function Tests: Recent Labs  Lab 06/13/22 2029  AST 20  ALT 16  ALKPHOS 58  BILITOT 0.4  PROT 7.1  ALBUMIN 4.2    No results for input(s): "LIPASE", "AMYLASE" in the last 168 hours. No results for input(s): "AMMONIA" in the last 168 hours. Coagulation Profile: Recent Labs  Lab 06/13/22 2029 06/14/22 0209 06/15/22 0332  INR 2.3* 2.5* 2.2*    Cardiac Enzymes: No results for input(s): "CKTOTAL", "CKMB", "CKMBINDEX", "TROPONINI" in the last 168 hours. BNP (last 3 results) No results for input(s): "PROBNP" in the last 8760 hours. HbA1C: No results for input(s): "HGBA1C" in the last 72 hours. CBG: No results for input(s): "GLUCAP" in the last 168 hours. Lipid Profile: No results for input(s): "CHOL", "HDL", "LDLCALC", "TRIG", "CHOLHDL", "LDLDIRECT" in the last 72 hours. Thyroid Function Tests: No results for input(s): "TSH", "T4TOTAL", "FREET4", "T3FREE", "THYROIDAB" in the last 72 hours. Anemia Panel: No results for input(s): "VITAMINB12", "FOLATE", "FERRITIN", "TIBC", "IRON", "RETICCTPCT" in the last 72 hours. Sepsis Labs: Recent Labs  Lab 06/13/22 2340  LATICACIDVEN 2.2*     No results found for this or any previous visit (from the past 240 hour(s)).       Radiology Studies: CT Angio Chest Pulmonary Embolism (PE) W or WO Contrast  Result Date: 06/15/2022 CLINICAL DATA:  A 88 year old female presents for evaluation of the chest, suspected pulmonary embolism in the setting of positive D-dimer. EXAM: CT ANGIOGRAPHY CHEST WITH CONTRAST TECHNIQUE: Multidetector CT imaging of the chest was performed using the standard protocol during bolus  administration of intravenous contrast. Multiplanar CT image reconstructions and MIPs were obtained to evaluate the vascular anatomy. RADIATION DOSE REDUCTION: This exam was performed according to the departmental dose-optimization program which includes automated exposure control, adjustment of the mA and/or kV according to patient size and/or use of iterative reconstruction technique. CONTRAST:  84mL OMNIPAQUE IOHEXOL 350 MG/ML SOLN COMPARISON:  Apr 09, 2022 FINDINGS: Cardiovascular: Calcified aortic atherosclerotic changes. Aorta not well assessed due to bolus timing on study optimized for assessment of pulmonary vasculature. Heart size is markedly enlarged mainly with RIGHT heart enlargement and LEFT atrial enlargement. No pericardial effusion or signs of pericardial nodularity. Main pulmonary artery is opacified to 506 Hounsfield units. The study is negative for pulmonary embolism. Mediastinum/Nodes: Patulous esophagus. No thoracic inlet adenopathy. No axillary lymphadenopathy. No mediastinal or hilar lymphadenopathy. Lungs/Pleura: Moderate RIGHT and trace LEFT pleural fluid collections layering dependently in the chest increased pleural fluid on the RIGHT slightly since previous imaging and development of LEFT-sided pleural effusion since previous imaging from March of  2023. No pneumothorax. Basilar volume loss associated with pleural fluid. No gross signs of pulmonary edema. Airways are patent. Upper Abdomen: Reflux of contrast into hepatic veins. No acute upper abdominal process on very limited assessment. Musculoskeletal: Spinal degenerative changes. Osteopenia. Unchanged lower thoracic and upper lumbar compression fractures of superior endplates of T12 and L1. similar endplate compression of T8 as well. Review of the MIP images confirms the above findings. IMPRESSION: 1. Negative for pulmonary embolism. 2. Signs of cardiac dysfunction with reflux of contrast into hepatic veins and cardiac enlargement  mainly LEFT atrium and RIGHT heart. 3. Increasing pleural fluid collections. No gross pulmonary edema at this time. Would however still correlate with signs of heart failure. 4. Compression fractures in the spine with similar appearance. 5. Patulous esophagus, correlate with any signs of esophageal dysmotility. Aortic Atherosclerosis (ICD10-I70.0). Electronically Signed   By: Donzetta Kohut M.D.   On: 06/15/2022 12:30   ECHOCARDIOGRAM COMPLETE  Result Date: 06/14/2022    ECHOCARDIOGRAM REPORT   Patient Name:   Carol Snyder Date of Exam: 06/14/2022 Medical Rec #:  [id number]   Height:       68.0 in Accession #:    [id number]  Weight:       105.3 lb Date of Birth:  12/12/1928   BSA:          1.556 m Patient Age:    89 years    BP:           101/51 mmHg Patient Gender: F           HR:           102 bpm. Exam Location:  Inpatient Procedure: 2D Echo Indications:    syncope  History:        Patient has prior history of Echocardiogram examinations, most                 recent 07/05/2019. CHF, Pacemaker; Arrythmias:Atrial                 Fibrillation.  Sonographer:    Delcie Roch RDCS Referring Phys: [id number] CHING T TU IMPRESSIONS  1. Left ventricular ejection fraction, by estimation, is 55 to 60%. The left ventricle has normal function. The left ventricle has no regional wall motion abnormalities. There is mild left ventricular hypertrophy. Left ventricular diastolic parameters are indeterminate. There is the interventricular septum is flattened in diastole ('D' shaped left ventricle), consistent with right ventricular volume overload.  2. Right ventricular systolic function is moderately reduced. The right ventricular size is moderately enlarged. There is moderately elevated pulmonary artery systolic pressure.  3. Left atrial size was severely dilated.  4. Right atrial size was severely dilated.  5. Mild to moderate mitral valve regurgitation.  6. Tricuspid valve regurgitation is mild to moderate.  7. Likely did not  capture highest mean gradient. The aortic valve is calcified. Aortic valve regurgitation is mild. Mild to moderate aortic valve stenosis.  8. The inferior vena cava is dilated in size with <50% respiratory variability, suggesting right atrial pressure of 15 mmHg. Conclusion(s)/Recommendation(s): Compared to prior echo, there is more pronounced septal flattening would r/o pulmonary embolism. Otherwise EF has improved. FINDINGS  Left Ventricle: Left ventricular ejection fraction, by estimation, is 55 to 60%. The left ventricle has normal function. The left ventricle has no regional wall motion abnormalities. The left ventricular internal cavity size was normal in size. There is  mild left ventricular hypertrophy. The interventricular septum is flattened in diastole ('D'  shaped left ventricle), consistent with right ventricular volume overload. Left ventricular diastolic parameters are indeterminate. Right Ventricle: The right ventricular size is moderately enlarged. Right ventricular systolic function is moderately reduced. There is moderately elevated pulmonary artery systolic pressure. The tricuspid regurgitant velocity is 2.97 m/s, and with an assumed right atrial pressure of 15 mmHg, the estimated right ventricular systolic pressure is 50.3 mmHg. Left Atrium: Left atrial size was severely dilated. Right Atrium: Right atrial size was severely dilated. Pericardium: The pericardium was not well visualized. Mitral Valve: Mild to moderate mitral annular calcification. Mild to moderate mitral valve regurgitation. Tricuspid Valve: Tricuspid valve regurgitation is mild to moderate. Aortic Valve: Likely did not capture highest mean gradient. The aortic valve is calcified. Aortic valve regurgitation is mild. Mild to moderate aortic stenosis is present. Aortic valve mean gradient measures 9.3 mmHg. Aortic valve peak gradient measures 16.6 mmHg. Aortic valve area, by VTI measures 0.82 cm. Pulmonic Valve: Pulmonic valve  regurgitation is trivial. Aorta: The aortic root and ascending aorta are structurally normal, with no evidence of dilitation. Venous: The inferior vena cava is dilated in size with less than 50% respiratory variability, suggesting right atrial pressure of 15 mmHg. Additional Comments: A device lead is visualized.  LEFT VENTRICLE PLAX 2D LVIDd:         4.10 cm LVIDs:         3.00 cm LV PW:         1.10 cm LV IVS:        0.90 cm LVOT diam:     1.80 cm LV SV:         27 LV SV Index:   17 LVOT Area:     2.54 cm  RIGHT VENTRICLE            IVC RV S prime:     8.38 cm/s  IVC diam: 2.10 cm LEFT ATRIUM             Index        RIGHT ATRIUM           Index LA diam:        4.30 cm 2.76 cm/m   RA Area:     34.70 cm LA Vol (A2C):   65.7 ml 42.22 ml/m  RA Volume:   136.00 ml 87.40 ml/m LA Vol (A4C):   74.7 ml 48.01 ml/m LA Biplane Vol: 75.7 ml 48.65 ml/m  AORTIC VALVE AV Area (Vmax):    0.95 cm AV Area (Vmean):   0.91 cm AV Area (VTI):     0.82 cm AV Vmax:           203.67 cm/s AV Vmean:          138.667 cm/s AV VTI:            0.327 m AV Peak Grad:      16.6 mmHg AV Mean Grad:      9.3 mmHg LVOT Vmax:         75.92 cm/s LVOT Vmean:        49.450 cm/s LVOT VTI:          0.105 m LVOT/AV VTI ratio: 0.32  AORTA Ao Root diam: 3.00 cm Ao Asc diam:  2.60 cm TRICUSPID VALVE TR Peak grad:   35.3 mmHg TR Vmax:        297.00 cm/s  SHUNTS Systemic VTI:  0.11 m Systemic Diam: 1.80 cm Carolan ClinesMary Branch Electronically signed by Carolan ClinesMary Branch Signature Date/Time: 06/14/2022/6:14:58 PM  Final    DG Pelvis Portable  Result Date: 06/14/2022 CLINICAL DATA:  Recent fall with pelvic pain, initial encounter EXAM: PORTABLE PELVIS 1-2 VIEWS COMPARISON:  None Available. FINDINGS: Left hip prosthesis is noted. Pelvic ring appears intact. Significant degenerative changes of the right hip joint are noted. No acute fracture is seen. No soft tissue abnormality is noted. IMPRESSION: Chronic degenerative changes of the right hip joint. No acute fracture  is seen. Electronically Signed   By: Alcide Clever M.D.   On: 06/14/2022 01:20   CT CERVICAL SPINE WO CONTRAST  Result Date: 06/13/2022 CLINICAL DATA:  Recent fall with neck pain, initial encounter EXAM: CT CERVICAL SPINE WITHOUT CONTRAST TECHNIQUE: Multidetector CT imaging of the cervical spine was performed without intravenous contrast. Multiplanar CT image reconstructions were also generated. RADIATION DOSE REDUCTION: This exam was performed according to the departmental dose-optimization program which includes automated exposure control, adjustment of the mA and/or kV according to patient size and/or use of iterative reconstruction technique. COMPARISON:  04/09/2022 FINDINGS: Alignment: Stable in appearance from the prior exam. Mild anterolisthesis of C2 on C3 is noted with retrolisthesis of C3 on C4 and C4 on C5. Skull base and vertebrae: 7 cervical segments are well visualized. Vertebral body height is well maintained. Disc space narrowing is noted from C3-C6. Facet hypertrophic changes are noted as well as osteophytic changes. No acute fracture or acute facet abnormality is noted. The odontoid is within normal limits. Soft tissues and spinal canal: Surrounding soft tissue structures are within normal limits. Some air is noted within the jugular venous system likely related to prior IV initiation. Upper chest: No acute rib abnormality is noted. Small right-sided pleural effusion is seen. No focal confluent infiltrate is noted. Some apical scarring is noted bilaterally. Other: None IMPRESSION: Degenerative changes similar to that seen on prior exam. No acute bony abnormality is seen. Electronically Signed   By: Alcide Clever M.D.   On: 06/13/2022 21:22   CT HEAD WO CONTRAST  Result Date: 06/13/2022 CLINICAL DATA:  Trauma. EXAM: CT HEAD WITHOUT CONTRAST TECHNIQUE: Contiguous axial images were obtained from the base of the skull through the vertex without intravenous contrast. RADIATION DOSE REDUCTION: This  exam was performed according to the departmental dose-optimization program which includes automated exposure control, adjustment of the mA and/or kV according to patient size and/or use of iterative reconstruction technique. COMPARISON:  Head CT dated 06/05/2018. FINDINGS: Brain: Mild age-related atrophy and chronic microvascular ischemic changes. Small right basal ganglia old lacunar infarct. There is no acute intracranial hemorrhage. No mass effect or midline shift. No extra-axial fluid collection. Vascular: No hyperdense vessel or unexpected calcification. Skull: Normal. Negative for fracture or focal lesion. Sinuses/Orbits: There is partial opacification of the left maxillary sinus with air-fluid level. The remainder of the visualized paranasal sinuses and mastoid air cells are clear. Other: Right posterior parietal scalp laceration. A 4 mm radiopaque focus in the scalp adjacent to the laceration noted which is new since the CT of 2019. Clinical correlation recommended to evaluate for possibility of foreign object. IMPRESSION: 1. No acute intracranial pathology. 2. Mild age-related atrophy and chronic microvascular ischemic changes. 3. Right parietal scalp laceration and possible foreign fragment. Electronically Signed   By: Elgie Collard M.D.   On: 06/13/2022 21:07   DG Chest Port 1 View  Result Date: 06/13/2022 CLINICAL DATA:  Recent fall with chest pain, initial encounter EXAM: PORTABLE CHEST 1 VIEW COMPARISON:  04/11/2022 FINDINGS: Cardiac shadow is enlarged but stable. Pacing device  is again seen. Aortic calcifications are noted. The lungs are well aerated bilaterally. Mild right basilar atelectasis is seen. No pneumothorax is noted. Old healed rib fracture of the right tenth rib posteriorly is noted and stable. IMPRESSION: Mild right basilar atelectasis. Stable cardiomegaly. Electronically Signed   By: Alcide Clever M.D.   On: 06/13/2022 20:55    Scheduled Meds:  acetaminophen  650 mg Oral Once    feeding supplement  237 mL Oral BID BM   Ensure Max Protein  11 oz Oral Daily   Warfarin - Pharmacist Dosing Inpatient   Does not apply q1600    LOS: 0 days   Time spent:  Azucena Fallen, DO [facility]  If 7PM-7AM, please contact night-coverage [email or web address]  06/15/2022, 5:17 PM

## 2022-06-15 NOTE — Evaluation (Signed)
Physical Therapy Evaluation Patient Details Name: Carol Snyder MRN: [id number] DOB: 1929-05-29 Today's Date: 06/15/2022  History of Present Illness  Pt adm 7/28 after syncopal episode with fall. PMH - CVA, THR, afib, pacer, orthostatic hypotension  Clinical Impression  Pt presents to PT with limited mobility due to poor activity tolerance due to cardiopulmonary issues. While amb pt's HR up to 145 and became diaphoretic and light headed. Returned to sitting and then supine with BP 88/66. After supine for several minutes HR down to 110's and BP to  143/88. Expect mobility will progress as these medical issues improve.        Recommendations for follow up therapy are one component of a multi-disciplinary discharge planning process, led by the attending physician.  Recommendations may be updated based on patient status, additional functional criteria and insurance authorization.  Follow Up Recommendations Home health PT      Assistance Recommended at Discharge Frequent or constant Supervision/Assistance  Patient can return home with the following  A little help with walking and/or transfers;A little help with bathing/dressing/bathroom;Assistance with cooking/housework;Assist for transportation;Help with stairs or ramp for entrance    Equipment Recommendations None recommended by PT  Recommendations for Other Services       Functional Status Assessment Patient has had a recent decline in their functional status and demonstrates the ability to make significant improvements in function in a reasonable and predictable amount of time.     Precautions / Restrictions        Mobility  Bed Mobility Overal bed mobility: Needs Assistance Bed Mobility: Supine to Sit, Sit to Supine     Supine to sit: Min assist Sit to supine: Mod assist   General bed mobility comments: Assist to elevate trunk into sitting. Mod assist to lower trunk and bring legs up due to orthostatic    Transfers Overall  transfer level: Needs assistance Equipment used: Rolling walker (2 wheels) Transfers: Sit to/from Stand Sit to Stand: Min assist, Min guard           General transfer comment: Assist to stabilize    Ambulation/Gait Ambulation/Gait assistance: Min guard Gait Distance (Feet): 40 Feet (10' x 1, 40' x 1) Assistive device: Rolling walker (2 wheels) Gait Pattern/deviations: Step-through pattern, Decreased step length - right, Decreased step length - left, Trunk flexed Gait velocity: decr Gait velocity interpretation: <1.31 ft/sec, indicative of household ambulator   General Gait Details: Assist for Wellsite geologist    Modified Rankin (Stroke Patients Only)       Balance Overall balance assessment: Needs assistance Sitting-balance support: No upper extremity supported, Feet supported Sitting balance-Leahy Scale: Good     Standing balance support: Bilateral upper extremity supported, During functional activity, Single extremity supported Standing balance-Leahy Scale: Poor Standing balance comment: walker and min guard for static standing. Needed 1 UE on walker when washing hands                             Pertinent Vitals/Pain Pain Assessment Pain Assessment: No/denies pain    Home Living Family/patient expects to be discharged to:: Private residence Living Arrangements: Alone;Children Available Help at Discharge: Family;Available 24 hours/day Type of Home: House Home Access: Ramped entrance       Home Layout: One level Home Equipment: Agricultural consultant (2 wheels);Rollator (4 wheels);Cane - single point;Shower seat - built in;Wheelchair - manual;BSC/3in1 Additional Comments:  Family to stay with pt    Prior Function Prior Level of Function : Independent/Modified Independent             Mobility Comments: Uses rolling walker ADLs Comments: Uses w/c at times for cooking     Hand Dominance   Dominant Hand:  Right    Extremity/Trunk Assessment   Upper Extremity Assessment Upper Extremity Assessment: Defer to OT evaluation    Lower Extremity Assessment Lower Extremity Assessment: Generalized weakness       Communication   Communication: No difficulties  Cognition Arousal/Alertness: Awake/alert Behavior During Therapy: WFL for tasks assessed/performed Overall Cognitive Status: Within Functional Limits for tasks assessed                                          General Comments General comments (skin integrity, edema, etc.): RHR 120. HR with amb 145. BP in supine 141/94. BP after amb 88/66. SpO2 94% on 2L    Exercises     Assessment/Plan    PT Assessment Patient needs continued PT services  PT Problem List Decreased strength;Decreased activity tolerance;Decreased balance;Decreased mobility;Cardiopulmonary status limiting activity       PT Treatment Interventions DME instruction;Gait training;Functional mobility training;Therapeutic activities;Therapeutic exercise;Balance training;Patient/family education    PT Goals (Current goals can be found in the Care Plan section)  Acute Rehab PT Goals PT Goal Formulation: With patient Time For Goal Achievement: 06/29/22 Potential to Achieve Goals: Good    Frequency Min 3X/week     Co-evaluation               AM-PAC PT "6 Clicks" Mobility  Outcome Measure Help needed turning from your back to your side while in a flat bed without using bedrails?: A Little Help needed moving from lying on your back to sitting on the side of a flat bed without using bedrails?: A Little Help needed moving to and from a bed to a chair (including a wheelchair)?: A Little Help needed standing up from a chair using your arms (e.g., wheelchair or bedside chair)?: A Little Help needed to walk in hospital room?: A Little Help needed climbing 3-5 steps with a railing? : A Lot 6 Click Score: 17    End of Session Equipment Utilized  During Treatment: Gait belt;Oxygen Activity Tolerance: Treatment limited secondary to medical complications (Comment) (High HR. Decr BP) Patient left: in bed;with call bell/phone within reach;with family/visitor present Nurse Communication: Mobility status;Other (comment) (Hight HR and low BP) PT Visit Diagnosis: Unsteadiness on feet (R26.81);Other abnormalities of gait and mobility (R26.89);Muscle weakness (generalized) (M62.81)    Time: 1937-9024 PT Time Calculation (min) (ACUTE ONLY): 20 min   Charges:   PT Evaluation $PT Eval Moderate Complexity: 1 Mod          [facility] PT Acute Rehabilitation Services Office [phone]   Angelina Ok [facility] 06/15/2022, 1:53 PM

## 2022-06-16 DIAGNOSIS — Z823 Family history of stroke: Secondary | ICD-10-CM | POA: Diagnosis not present

## 2022-06-16 DIAGNOSIS — H04123 Dry eye syndrome of bilateral lacrimal glands: Secondary | ICD-10-CM | POA: Diagnosis present

## 2022-06-16 DIAGNOSIS — Z801 Family history of malignant neoplasm of trachea, bronchus and lung: Secondary | ICD-10-CM | POA: Diagnosis not present

## 2022-06-16 DIAGNOSIS — F03A0 Unspecified dementia, mild, without behavioral disturbance, psychotic disturbance, mood disturbance, and anxiety: Secondary | ICD-10-CM | POA: Diagnosis present

## 2022-06-16 DIAGNOSIS — R0902 Hypoxemia: Secondary | ICD-10-CM | POA: Diagnosis present

## 2022-06-16 DIAGNOSIS — I2510 Atherosclerotic heart disease of native coronary artery without angina pectoris: Secondary | ICD-10-CM | POA: Diagnosis present

## 2022-06-16 DIAGNOSIS — I4819 Other persistent atrial fibrillation: Secondary | ICD-10-CM | POA: Diagnosis present

## 2022-06-16 DIAGNOSIS — I951 Orthostatic hypotension: Secondary | ICD-10-CM | POA: Diagnosis present

## 2022-06-16 DIAGNOSIS — R55 Syncope and collapse: Secondary | ICD-10-CM | POA: Diagnosis present

## 2022-06-16 DIAGNOSIS — I495 Sick sinus syndrome: Secondary | ICD-10-CM | POA: Diagnosis present

## 2022-06-16 DIAGNOSIS — Z681 Body mass index (BMI) 19 or less, adult: Secondary | ICD-10-CM | POA: Diagnosis not present

## 2022-06-16 DIAGNOSIS — S0101XA Laceration without foreign body of scalp, initial encounter: Secondary | ICD-10-CM | POA: Diagnosis present

## 2022-06-16 DIAGNOSIS — Z8619 Personal history of other infectious and parasitic diseases: Secondary | ICD-10-CM | POA: Diagnosis not present

## 2022-06-16 DIAGNOSIS — Z8673 Personal history of transient ischemic attack (TIA), and cerebral infarction without residual deficits: Secondary | ICD-10-CM | POA: Diagnosis not present

## 2022-06-16 DIAGNOSIS — Z8249 Family history of ischemic heart disease and other diseases of the circulatory system: Secondary | ICD-10-CM | POA: Diagnosis not present

## 2022-06-16 DIAGNOSIS — Z7901 Long term (current) use of anticoagulants: Secondary | ICD-10-CM | POA: Diagnosis not present

## 2022-06-16 DIAGNOSIS — Z66 Do not resuscitate: Secondary | ICD-10-CM | POA: Diagnosis present

## 2022-06-16 DIAGNOSIS — Z885 Allergy status to narcotic agent status: Secondary | ICD-10-CM | POA: Diagnosis not present

## 2022-06-16 DIAGNOSIS — Z96642 Presence of left artificial hip joint: Secondary | ICD-10-CM | POA: Diagnosis present

## 2022-06-16 DIAGNOSIS — M533 Sacrococcygeal disorders, not elsewhere classified: Secondary | ICD-10-CM | POA: Diagnosis present

## 2022-06-16 DIAGNOSIS — W19XXXA Unspecified fall, initial encounter: Secondary | ICD-10-CM | POA: Diagnosis present

## 2022-06-16 DIAGNOSIS — I255 Ischemic cardiomyopathy: Secondary | ICD-10-CM | POA: Diagnosis present

## 2022-06-16 DIAGNOSIS — Z87891 Personal history of nicotine dependence: Secondary | ICD-10-CM | POA: Diagnosis not present

## 2022-06-16 DIAGNOSIS — Z23 Encounter for immunization: Secondary | ICD-10-CM | POA: Diagnosis present

## 2022-06-16 DIAGNOSIS — I5022 Chronic systolic (congestive) heart failure: Secondary | ICD-10-CM | POA: Diagnosis present

## 2022-06-16 DIAGNOSIS — Z882 Allergy status to sulfonamides status: Secondary | ICD-10-CM | POA: Diagnosis not present

## 2022-06-16 LAB — BASIC METABOLIC PANEL
Anion gap: 8 (ref 5–15)
BUN: 16 mg/dL (ref 8–23)
CO2: 24 mmol/L (ref 22–32)
Calcium: 8.9 mg/dL (ref 8.9–10.3)
Chloride: 107 mmol/L (ref 98–111)
Creatinine, Ser: 1.03 mg/dL — ABNORMAL HIGH (ref 0.44–1.00)
GFR, Estimated: 52 mL/min — ABNORMAL LOW (ref >60)
Glucose, Bld: 96 mg/dL (ref 70–99)
Potassium: 3.8 mmol/L (ref 3.5–5.1)
Sodium: 139 mmol/L (ref 135–145)

## 2022-06-16 LAB — CBC
HCT: 27.2 % — ABNORMAL LOW (ref 36.0–46.0)
Hemoglobin: 8.6 g/dL — ABNORMAL LOW (ref 12.0–15.0)
MCH: 29.7 pg (ref 26.0–34.0)
MCHC: 31.6 g/dL (ref 30.0–36.0)
MCV: 93.8 fL (ref 80.0–100.0)
Platelets: 136 10*3/uL — ABNORMAL LOW (ref 150–400)
RBC: 2.9 MIL/uL — ABNORMAL LOW (ref 3.87–5.11)
RDW: 13.2 % (ref 11.5–15.5)
WBC: 7.5 10*3/uL (ref 4.0–10.5)
nRBC: 0 % (ref 0.0–0.2)

## 2022-06-16 LAB — PROTIME-INR
INR: 2.4 — ABNORMAL HIGH (ref 0.8–1.2)
Prothrombin Time: 26.2 seconds — ABNORMAL HIGH (ref 11.4–15.2)

## 2022-06-16 MED ORDER — FUROSEMIDE 40 MG PO TABS
40.0000 mg | ORAL_TABLET | Freq: Every day | ORAL | Status: DC
  Administered 2022-06-16 – 2022-06-17 (×2): 40 mg via ORAL
  Filled 2022-06-16 (×2): qty 1

## 2022-06-16 MED ORDER — DIGOXIN 125 MCG PO TABS
62.5000 ug | ORAL_TABLET | Freq: Every day | ORAL | Status: DC
  Administered 2022-06-16 – 2022-06-17 (×2): 62.5 ug via ORAL
  Filled 2022-06-16 (×2): qty 1

## 2022-06-16 MED ORDER — WARFARIN SODIUM 2.5 MG PO TABS
2.5000 mg | ORAL_TABLET | Freq: Once | ORAL | Status: AC
Start: 2022-06-16 — End: 2022-06-16
  Administered 2022-06-16: 2.5 mg via ORAL
  Filled 2022-06-16: qty 1

## 2022-06-16 MED ORDER — CARVEDILOL 3.125 MG PO TABS
3.1250 mg | ORAL_TABLET | Freq: Two times a day (BID) | ORAL | Status: DC
  Administered 2022-06-16 – 2022-06-17 (×2): 3.125 mg via ORAL
  Filled 2022-06-16 (×2): qty 1

## 2022-06-16 MED ORDER — CARVEDILOL 3.125 MG PO TABS: 3.1250 mg | ORAL_TABLET | Freq: Two times a day (BID) | ORAL | 0 refills | Status: DC

## 2022-06-16 NOTE — Progress Notes (Signed)
SATURATION QUALIFICATIONS: (This note is used to comply with regulatory documentation for home oxygen)  Patient Saturations on Room Air at Rest = 96%  Patient Saturations on Room Air while Ambulating = 82%  Patient Saturations on 1 Liters of oxygen while Ambulating = 90%  Please briefly explain why patient needs home oxygen: Pt requires supplemental 02 to maintain SP02 >90% during activity.   Vale Haven, PT, DPT Acute Rehabilitation Services Secure chat preferred Office [phone]

## 2022-06-16 NOTE — Evaluation (Addendum)
Occupational Therapy Evaluation Patient Details Name: Carol Snyder MRN: [id number] DOB: [birth year] Today's Date: 06/16/2022   History of Present Illness Pt adm 7/28 after syncopal episode with fall. PMH - CVA, THR, afib, pacer, orthostatic hypotension   Clinical Impression   Daughter present during eval and states her Mom will have 24/7 care after DC. HR varied form 130s - 170s during session. BP stable. Increased SOB noted with mobility however unable to achieve accurate SpO2 due to poor waveform.  SpO2 varied form 82-100. Recommend HHOT at DC. Acute OT to follow.      Recommendations for follow up therapy are one component of a multi-disciplinary discharge planning process, led by the attending physician.  Recommendations may be updated based on patient status, additional functional criteria and insurance authorization.   Follow Up Recommendations  Home health OT [facility] Aide)    Assistance Recommended at Discharge Frequent or constant Supervision/Assistance  Patient can return home with the following A little help with walking and/or transfers;A little help with bathing/dressing/bathroom;Assistance with cooking/housework;Direct supervision/assist for medications management;Assist for transportation;Help with stairs or ramp for entrance    Functional Status Assessment  Patient has had a recent decline in their functional status and demonstrates the ability to make significant improvements in function in a reasonable and predictable amount of time.  Equipment Recommendations  None recommended by OT    Recommendations for Other Services       Precautions / Restrictions Precautions Precautions: Fall;Other (comment) Precaution Comments: watch BP/HR Restrictions Weight Bearing Restrictions: No      Mobility Bed Mobility Overal bed mobility: Needs Assistance             General bed mobility comments: OOB in chair    Transfers Overall transfer level: Needs  assistance Equipment used: Rolling walker (2 wheels) Transfers: Sit to/from Stand Sit to Stand: Min guard                  Balance Overall balance assessment: Needs assistance Sitting-balance support: No upper extremity supported, Feet supported Sitting balance-Leahy Scale: Good     Standing balance support: During functional activity Standing balance-Leahy Scale: Poor Standing balance comment: Requires UE support on RW. Total A for pericare by daughter                           ADL either performed or assessed with clinical judgement   ADL Overall ADL's : Needs assistance/impaired     Grooming: Set up   Upper Body Bathing: Set up   Lower Body Bathing: Minimal assistance   Upper Body Dressing : Set up   Lower Body Dressing: Minimal assistance   Toilet Transfer: Minimal assistance   Toileting- Clothing Manipulation and Hygiene: Minimal assistance       Functional mobility during ADLs: Minimal assistance;Rolling walker (2 wheels)       Vision         Perception     Praxis      Pertinent Vitals/Pain Pain Assessment Pain Assessment: Faces Faces Pain Scale: Hurts a little bit Pain Location: tail bone Pain Descriptors / Indicators: Sore, Discomfort Pain Intervention(s): Limited activity within patient's tolerance     Hand Dominance Right   Extremity/Trunk Assessment Upper Extremity Assessment Upper Extremity Assessment: Generalized weakness   Lower Extremity Assessment Lower Extremity Assessment: Defer to PT evaluation   Cervical / Trunk Assessment Cervical / Trunk Assessment: Kyphotic   Communication Communication Communication: No difficulties   Cognition Arousal/Alertness:  Awake/alert Behavior During Therapy: WFL for tasks assessed/performed Overall Cognitive Status: Within Functional Limits for tasks assessed                                       General Comments  difficult to read SpO2    Exercises      Shoulder Instructions      Home Living Family/patient expects to be discharged to:: Private residence Living Arrangements: Alone;Children Available Help at Discharge: Family;Available 24 hours/day Type of Home: House Home Access: Ramped entrance     Home Layout: One level     Bathroom Shower/Tub: Producer, television/film/video: Handicapped height Bathroom Accessibility: Yes   Home Equipment: Agricultural consultant (2 wheels);Rollator (4 wheels);Cane - single point;Shower seat - built in;Wheelchair - manual;BSC/3in1   Additional Comments: Family to stay with pt      Prior Functioning/Environment Prior Level of Function : Independent/Modified Independent             Mobility Comments: Uses rolling walker ADLs Comments: Uses w/c at times for cooking        OT Problem List: Decreased strength;Decreased activity tolerance;Impaired balance (sitting and/or standing);Decreased safety awareness;Decreased knowledge of use of DME or AE;Cardiopulmonary status limiting activity;Pain      OT Treatment/Interventions: Self-care/ADL training;Therapeutic exercise;DME and/or AE instruction;Energy conservation;Therapeutic activities;Patient/family education;Balance training    OT Goals(Current goals can be found in the care plan section) Acute Rehab OT Goals Patient Stated Goal: to get better and go home OT Goal Formulation: With patient/family Time For Goal Achievement: 06/30/22 Potential to Achieve Goals: Good ADL Goals Pt Will Transfer to Toilet: with supervision;ambulating Pt Will Perform Toileting - Clothing Manipulation and hygiene: with supervision;sit to/from stand Additional ADL Goal #1: Pt will verbalize 3 energy conservation strategies Additional ADL Goal #2: Pt/daughter will verbalize 3 strategies to reduce risk of falls  OT Frequency: Min 2X/week    Co-evaluation              AM-PAC OT "6 Clicks" Daily Activity     Outcome Measure Help from another person eating  meals?: None Help from another person taking care of personal grooming?: A Little Help from another person toileting, which includes using toliet, bedpan, or urinal?: A Little Help from another person bathing (including washing, rinsing, drying)?: A Little Help from another person to put on and taking off regular upper body clothing?: A Little Help from another person to put on and taking off regular lower body clothing?: A Little 6 Click Score: 19   End of Session Equipment Utilized During Treatment: Gait belt;Rolling walker (2 wheels);Oxygen (1L) Nurse Communication: Mobility status  Activity Tolerance: Patient tolerated treatment well Patient left: in chair;with call bell/phone within reach;with family/visitor present  OT Visit Diagnosis: Unsteadiness on feet (R26.81);Other abnormalities of gait and mobility (R26.89);Muscle weakness (generalized) (M62.81);History of falling (Z91.81);Pain Pain - part of body:  (tailbone)                Time: 6962-9528 OT Time Calculation (min): 25 min Charges:  OT General Charges $OT Visit: 1 Visit OT Evaluation $OT Eval Moderate Complexity: 1 Mod OT Treatments $Self Care/Home Management : 8-22 mins  Luisa Dago, OT/L   Acute OT Clinical Specialist Acute Rehabilitation Services Pager [phone] Office [phone]   [facility] 06/16/2022, 2:26 PM

## 2022-06-16 NOTE — Progress Notes (Signed)
ANTICOAGULATION CONSULT NOTE  Pharmacy Consult for Warfarin  Indication: atrial fibrillation  Allergies  Allergen Reactions   Codeine Other (See Comments)    unknown   Enbrel [Etanercept] Other (See Comments)    unknown   Hydromorphone Other (See Comments)    unknown   Motrin [Ibuprofen] Other (See Comments)    Due to blood thinner    Sulfa Antibiotics Other (See Comments)    Childhood allergy    Voltaren [Diclofenac] Other (See Comments)    unknown   Naproxen Sodium Palpitations     Labs: Recent Labs    06/13/22 2327 06/13/22 2348 06/14/22 0209 06/15/22 0332 06/16/22 0301  HGB  --    < > 9.6* 9.0* 8.6*  HCT  --    < > 30.0* 28.3* 27.2*  PLT  --   --  139* 141* 136*  LABPROT  --   --  26.8* 24.2* 26.2*  INR  --   --  2.5* 2.2* 2.4*  CREATININE  --    < > 1.15* 1.28* 1.03*  TROPONINIHS 14  --  36*  --   --    < > = values in this interval not displayed.     Estimated Creatinine Clearance: 27.4 mL/min (A) (by C-G formula based on SCr of 1.03 mg/dL (H)).   Assessment: 88 y/o F on warfarin PTA for afib. Presented to the ED with fall. CT head negative for bleed. Continuing warfarin. INR 2.3 on admission. Patient's daughter reports home repeating regimen of 2.5mg , 2.5mg , and 5mg  every 3 days.   INR 2.4 today  Goal of Therapy:  INR 2-3 Monitor platelets by anticoagulation protocol: Yes   Plan:  Give warfarin 2.5mg  tonight as per home regimen Monitor INR, CBC, signs/symptoms of bleeding daily   Thank you , PharmD  7/31/20238:01 AM

## 2022-06-16 NOTE — Progress Notes (Signed)
OT Cancellation Note  Patient Details Name: Carol Snyder MRN: [id number] DOB: March 15, 1929   Cancelled Treatment:    Reason Eval/Treat Not Completed: Patient at procedure or test/ unavailable (eating breakfast. will return)  [facility] 06/16/2022, 10:47 AM Luisa Dago, OT/L   Acute OT Clinical Specialist Acute Rehabilitation Services Pager [phone] Office [phone]

## 2022-06-16 NOTE — Progress Notes (Signed)
PROGRESS NOTE    Carol Snyder  YTK:[id number] DOB: Feb 06, 1929 DOA: 06/13/2022 PCP: Kaleen Mask, MD   Brief Narrative:  Carol Snyder is a 88 y.o. female with medical history significant of ischemic cardiomyopathy, tachybradycardia syndrome with pacemaker, chronic atrial fibrillation on Coumadin, CVA, dementia who presents following a fall/syncopal event (recurrent).  Assessment & Plan:   Principal Problem:   Syncope and collapse Active Problems:   Atrial fibrillation, chronic (HCC)   Fall   Pacemaker   Scalp hematoma, initial encounter   Chronic systolic CHF (congestive heart failure) (HCC)   Sacral pain  Syncope and collapse Orthostatic hypotension, improving -Recurrent event, appears to be orthostatic in nature given patient's symptoms and complicated by polypharmacy given ongoing hypotension/bradycardia -Digoxin paused initially - resumed now that heart rate/BP improving -Pacemaker evaluated without recorded incident -Echo shows elevated R sided pressures as below, CTA negative -CT head C-spine and pelvis x-ray unremarkable for acute findings or fracture -PT OT evaluating, somewhat tachycardic today with ambulation initiate low-dose carvedilol -follow along, likely discharge in the next 24 to 48 hours with PT OT and home health  Chronic systolic CHF (congestive heart failure) (HCC) -Last echocardiogram in 06/2019 with EF of 25 to 30%.  Severe akinesis of the entire inferior wall of the left ventricle.  Moderate thickening of the mitral valve. -Echo with abnormal with elevated R sided pressure per cardiology- PE ruled out via CTA.  Ambulatory dysfunction, fall, traumatic scalp hematoma, initial encounter Bleeding has been controlled with multiple staple at the scalp (placed 06/14/22) - remove in 7-14 days pending wound healing PT following - unstable today with ambulation - repeat evaluation in the am.   Atrial fibrillation, chronic (HCC) INR is therapeutic at  2.8. -Continue warfarin with dosing per pharmacy   DVT prophylaxis: Warfarin Code Status: DNR Family Communication: Daughter at bedside  Status is: Inpatient  Dispo: The patient is from: Home              Anticipated d/c is to: Home              Anticipated d/c date is: 24 to 48 hours              Patient currently not medically stable for discharge  Consultants:  None, cardiology sidelined in the ED this was not a formal consult  Procedures:  None  Antimicrobials:  None  Subjective: No acute issues or events overnight, denies nausea vomiting diarrhea constipation headache fevers chills or chest pain, moderately tachycardic with ambulation today - PT limited by weakness/debility.  Objective: Vitals:   06/15/22 1634 06/15/22 1942 06/15/22 2355 06/16/22 0343  BP: 138/90 126/80 132/83 117/83  Pulse: (!) 106 (!) 123 (!) 119 (!) 118  Resp: 20 20 20 20   Temp: 98.5 F (36.9 C) 97.9 F (36.6 C) 97.8 F (36.6 C) 97.8 F (36.6 C)  TempSrc: Oral Oral Oral Oral  SpO2: 98% 91% 98% 99%  Weight:      Height:       No intake or output data in the 24 hours ending 06/16/22 0752  Filed Weights   06/13/22 2031 06/14/22 0207  Weight: 45.4 kg 47.8 kg    Examination:  General:  Pleasantly resting in bed, No acute distress. Neck:  Without mass or deformity.  Trachea is midline. Lungs:  Clear to auscultate bilaterally without rhonchi, wheeze, or rales. Heart:  Regular rate and rhythm.  Without murmurs, rubs, or gallops. Abdomen:  Soft, nontender, nondistended.  Without  guarding or rebound. Extremities: Without cyanosis, clubbing, edema, or obvious deformity. Vascular:  Dorsalis pedis and posterior tibial pulses palpable bilaterally. Skin: Right posterior scalp laceration with staples noted (5)   Data Reviewed: I have personally reviewed following labs and imaging studies  CBC: Recent Labs  Lab 06/13/22 2029 06/13/22 2348 06/14/22 0209 06/15/22 0332 06/16/22 0301  WBC 8.3   --  11.5* 8.1 7.5  HGB 10.0* 9.9* 9.6* 9.0* 8.6*  HCT 32.5* 29.0* 30.0* 28.3* 27.2*  MCV 95.9  --  94.3 93.1 93.8  PLT 167  --  139* 141* 136*    Basic Metabolic Panel: Recent Labs  Lab 06/13/22 2029 06/13/22 2348 06/14/22 0209 06/15/22 0332 06/16/22 0301  NA 140 138 139 138 139  K 4.7 4.8 4.7 5.0 3.8  CL 106 106 109 105 107  CO2 26  --  22 27 24   GLUCOSE 106* 124* 132* 110* 96  BUN 25* 28* 24* 23 16  CREATININE 1.25* 1.10* 1.15* 1.28* 1.03*  CALCIUM 9.6  --  9.2 9.1 8.9    GFR: Estimated Creatinine Clearance: 27.4 mL/min (A) (by C-G formula based on SCr of 1.03 mg/dL (H)). Liver Function Tests: Recent Labs  Lab 06/13/22 2029  AST 20  ALT 16  ALKPHOS 58  BILITOT 0.4  PROT 7.1  ALBUMIN 4.2    No results for input(s): "LIPASE", "AMYLASE" in the last 168 hours. No results for input(s): "AMMONIA" in the last 168 hours. Coagulation Profile: Recent Labs  Lab 06/13/22 2029 06/14/22 0209 06/15/22 0332 06/16/22 0301  INR 2.3* 2.5* 2.2* 2.4*    Cardiac Enzymes: No results for input(s): "CKTOTAL", "CKMB", "CKMBINDEX", "TROPONINI" in the last 168 hours. BNP (last 3 results) No results for input(s): "PROBNP" in the last 8760 hours. HbA1C: No results for input(s): "HGBA1C" in the last 72 hours. CBG: No results for input(s): "GLUCAP" in the last 168 hours. Lipid Profile: No results for input(s): "CHOL", "HDL", "LDLCALC", "TRIG", "CHOLHDL", "LDLDIRECT" in the last 72 hours. Thyroid Function Tests: No results for input(s): "TSH", "T4TOTAL", "FREET4", "T3FREE", "THYROIDAB" in the last 72 hours. Anemia Panel: No results for input(s): "VITAMINB12", "FOLATE", "FERRITIN", "TIBC", "IRON", "RETICCTPCT" in the last 72 hours. Sepsis Labs: Recent Labs  Lab 06/13/22 2340  LATICACIDVEN 2.2*     No results found for this or any previous visit (from the past 240 hour(s)).       Radiology Studies: CT Angio Chest Pulmonary Embolism (PE) W or WO Contrast  Result Date:  06/15/2022 CLINICAL DATA:  A 88 year old female presents for evaluation of the chest, suspected pulmonary embolism in the setting of positive D-dimer. EXAM: CT ANGIOGRAPHY CHEST WITH CONTRAST TECHNIQUE: Multidetector CT imaging of the chest was performed using the standard protocol during bolus administration of intravenous contrast. Multiplanar CT image reconstructions and MIPs were obtained to evaluate the vascular anatomy. RADIATION DOSE REDUCTION: This exam was performed according to the departmental dose-optimization program which includes automated exposure control, adjustment of the mA and/or kV according to patient size and/or use of iterative reconstruction technique. CONTRAST:  36mL OMNIPAQUE IOHEXOL 350 MG/ML SOLN COMPARISON:  Apr 09, 2022 FINDINGS: Cardiovascular: Calcified aortic atherosclerotic changes. Aorta not well assessed due to bolus timing on study optimized for assessment of pulmonary vasculature. Heart size is markedly enlarged mainly with RIGHT heart enlargement and LEFT atrial enlargement. No pericardial effusion or signs of pericardial nodularity. Main pulmonary artery is opacified to 506 Hounsfield units. The study is negative for pulmonary embolism. Mediastinum/Nodes: Patulous esophagus. No thoracic  inlet adenopathy. No axillary lymphadenopathy. No mediastinal or hilar lymphadenopathy. Lungs/Pleura: Moderate RIGHT and trace LEFT pleural fluid collections layering dependently in the chest increased pleural fluid on the RIGHT slightly since previous imaging and development of LEFT-sided pleural effusion since previous imaging from March of 2023. No pneumothorax. Basilar volume loss associated with pleural fluid. No gross signs of pulmonary edema. Airways are patent. Upper Abdomen: Reflux of contrast into hepatic veins. No acute upper abdominal process on very limited assessment. Musculoskeletal: Spinal degenerative changes. Osteopenia. Unchanged lower thoracic and upper lumbar compression  fractures of superior endplates of T12 and L1. similar endplate compression of T8 as well. Review of the MIP images confirms the above findings. IMPRESSION: 1. Negative for pulmonary embolism. 2. Signs of cardiac dysfunction with reflux of contrast into hepatic veins and cardiac enlargement mainly LEFT atrium and RIGHT heart. 3. Increasing pleural fluid collections. No gross pulmonary edema at this time. Would however still correlate with signs of heart failure. 4. Compression fractures in the spine with similar appearance. 5. Patulous esophagus, correlate with any signs of esophageal dysmotility. Aortic Atherosclerosis (ICD10-I70.0). Electronically Signed   By: Donzetta Kohut M.D.   On: 06/15/2022 12:30   ECHOCARDIOGRAM COMPLETE  Result Date: 06/14/2022    ECHOCARDIOGRAM REPORT   Patient Name:   AVIELA BLUNDELL Date of Exam: 06/14/2022 Medical Rec #:  [id number]   Height:       68.0 in Accession #:    [id number]  Weight:       105.3 lb Date of Birth:  [birth year]   BSA:          1.556 m Patient Age:    89 years    BP:           101/51 mmHg Patient Gender: F           HR:           102 bpm. Exam Location:  Inpatient Procedure: 2D Echo Indications:    syncope  History:        Patient has prior history of Echocardiogram examinations, most                 recent 07/05/2019. CHF, Pacemaker; Arrythmias:Atrial                 Fibrillation.  Sonographer:    Delcie Roch RDCS Referring Phys: [id number] CHING T TU IMPRESSIONS  1. Left ventricular ejection fraction, by estimation, is 55 to 60%. The left ventricle has normal function. The left ventricle has no regional wall motion abnormalities. There is mild left ventricular hypertrophy. Left ventricular diastolic parameters are indeterminate. There is the interventricular septum is flattened in diastole ('D' shaped left ventricle), consistent with right ventricular volume overload.  2. Right ventricular systolic function is moderately reduced. The right ventricular size is  moderately enlarged. There is moderately elevated pulmonary artery systolic pressure.  3. Left atrial size was severely dilated.  4. Right atrial size was severely dilated.  5. Mild to moderate mitral valve regurgitation.  6. Tricuspid valve regurgitation is mild to moderate.  7. Likely did not capture highest mean gradient. The aortic valve is calcified. Aortic valve regurgitation is mild. Mild to moderate aortic valve stenosis.  8. The inferior vena cava is dilated in size with <50% respiratory variability, suggesting right atrial pressure of 15 mmHg. Conclusion(s)/Recommendation(s): Compared to prior echo, there is more pronounced septal flattening would r/o pulmonary embolism. Otherwise EF has improved. FINDINGS  Left Ventricle: Left ventricular ejection fraction,  by estimation, is 55 to 60%. The left ventricle has normal function. The left ventricle has no regional wall motion abnormalities. The left ventricular internal cavity size was normal in size. There is  mild left ventricular hypertrophy. The interventricular septum is flattened in diastole ('D' shaped left ventricle), consistent with right ventricular volume overload. Left ventricular diastolic parameters are indeterminate. Right Ventricle: The right ventricular size is moderately enlarged. Right ventricular systolic function is moderately reduced. There is moderately elevated pulmonary artery systolic pressure. The tricuspid regurgitant velocity is 2.97 m/s, and with an assumed right atrial pressure of 15 mmHg, the estimated right ventricular systolic pressure is 50.3 mmHg. Left Atrium: Left atrial size was severely dilated. Right Atrium: Right atrial size was severely dilated. Pericardium: The pericardium was not well visualized. Mitral Valve: Mild to moderate mitral annular calcification. Mild to moderate mitral valve regurgitation. Tricuspid Valve: Tricuspid valve regurgitation is mild to moderate. Aortic Valve: Likely did not capture highest mean  gradient. The aortic valve is calcified. Aortic valve regurgitation is mild. Mild to moderate aortic stenosis is present. Aortic valve mean gradient measures 9.3 mmHg. Aortic valve peak gradient measures 16.6 mmHg. Aortic valve area, by VTI measures 0.82 cm. Pulmonic Valve: Pulmonic valve regurgitation is trivial. Aorta: The aortic root and ascending aorta are structurally normal, with no evidence of dilitation. Venous: The inferior vena cava is dilated in size with less than 50% respiratory variability, suggesting right atrial pressure of 15 mmHg. Additional Comments: A device lead is visualized.  LEFT VENTRICLE PLAX 2D LVIDd:         4.10 cm LVIDs:         3.00 cm LV PW:         1.10 cm LV IVS:        0.90 cm LVOT diam:     1.80 cm LV SV:         27 LV SV Index:   17 LVOT Area:     2.54 cm  RIGHT VENTRICLE            IVC RV S prime:     8.38 cm/s  IVC diam: 2.10 cm LEFT ATRIUM             Index        RIGHT ATRIUM           Index LA diam:        4.30 cm 2.76 cm/m   RA Area:     34.70 cm LA Vol (A2C):   65.7 ml 42.22 ml/m  RA Volume:   136.00 ml 87.40 ml/m LA Vol (A4C):   74.7 ml 48.01 ml/m LA Biplane Vol: 75.7 ml 48.65 ml/m  AORTIC VALVE AV Area (Vmax):    0.95 cm AV Area (Vmean):   0.91 cm AV Area (VTI):     0.82 cm AV Vmax:           203.67 cm/s AV Vmean:          138.667 cm/s AV VTI:            0.327 m AV Peak Grad:      16.6 mmHg AV Mean Grad:      9.3 mmHg LVOT Vmax:         75.92 cm/s LVOT Vmean:        49.450 cm/s LVOT VTI:          0.105 m LVOT/AV VTI ratio: 0.32  AORTA Ao Root diam: 3.00 cm Ao Asc diam:  2.60 cm TRICUSPID VALVE TR Peak grad:   35.3 mmHg TR Vmax:        297.00 cm/s  SHUNTS Systemic VTI:  0.11 m Systemic Diam: 1.80 cm Carolan ClinesMary Branch Electronically signed by Carolan ClinesMary Branch Signature Date/Time: 06/14/2022/6:14:58 PM    Final     Scheduled Meds:  acetaminophen  650 mg Oral Once   digoxin  62.5 mcg Oral Daily   feeding supplement  237 mL Oral BID BM   furosemide  40 mg Oral Daily    Ensure Max Protein  11 oz Oral Daily   Warfarin - Pharmacist Dosing Inpatient   Does not apply q1600    LOS: 0 days   Time spent: 45min  Azucena FallenWilliam C Teller [place], DO [facility]  If 7PM-7AM, please contact night-coverage [email or web address]  06/16/2022, 7:52 AM

## 2022-06-16 NOTE — Progress Notes (Signed)
Physical Therapy Treatment Patient Details Name: Carol Snyder MRN: [id number] DOB: [birth year] Today's Date: 06/16/2022   History of Present Illness Pt adm 7/28 after syncopal episode with fall. PMH - CVA, THR, afib, pacer, orthostatic hypotension    PT Comments    Patient progressing well towards PT goals. Session focused on ambulation progression and functional transfers. Tolerated gait training with use of RW and Min guard assist for support with chair follow. Noted to have drop in BP with transitions/activity; however pt reports being asymptomatic. Also noted to have Sp02 drop to 80s on RA, donned 1L and able to maintain SP02 >90% with activity. HR ranging from 140-166 bpm throughout session.  Supine BP 126/93 Sitting BP 104/58 Sitting BP post walk 105/73 Pt reports some discomfort in her tailbone, which is chronic. Plans to d/c home with support of daughter. Will need 02 at home initially for activity. See walking 02 need note. Will follow.     Recommendations for follow up therapy are one component of a multi-disciplinary discharge planning process, led by the attending physician.  Recommendations may be updated based on patient status, additional functional criteria and insurance authorization.  Follow Up Recommendations  Home health PT     Assistance Recommended at Discharge Frequent or constant Supervision/Assistance  Patient can return home with the following A little help with walking and/or transfers;A little help with bathing/dressing/bathroom;Assistance with cooking/housework;Assist for transportation;Help with stairs or ramp for entrance   Equipment Recommendations  None recommended by PT    Recommendations for Other Services       Precautions / Restrictions Precautions Precautions: Fall;Other (comment) Precaution Comments: watch BP/HR Restrictions Weight Bearing Restrictions: No     Mobility  Bed Mobility Overal bed mobility: Needs Assistance Bed Mobility:  Rolling, Sidelying to Sit Rolling: Supervision Sidelying to sit: Min assist, HOB elevated       General bed mobility comments: Use of rails and assist with trunk to get into sitting, increased time and effort. No dizziness.    Transfers Overall transfer level: Needs assistance Equipment used: Rolling walker (2 wheels) Transfers: Sit to/from Stand Sit to Stand: Min guard           General transfer comment: Min guard for safety. Stood from [employer], from [facility] x1, from chair x1, transferred to chair post ambulation.    Ambulation/Gait Ambulation/Gait assistance: Min guard Gait Distance (Feet): 60 Feet (x2 bouts + 12') Assistive device: Rolling walker (2 wheels) Gait Pattern/deviations: Step-through pattern, Decreased step length - right, Decreased step length - left, Trunk flexed Gait velocity: decr     General Gait Details: Slow, steady gait with cues for upright posture and RW proximity. Sp02 dropped to 80s on RA, donned 1L and able to maintain SP02 >90% with activity. HR ranging from 140-166 bpm with activity. 1 seated rest break.   Stairs             Wheelchair Mobility    Modified Rankin (Stroke Patients Only)       Balance Overall balance assessment: Needs assistance Sitting-balance support: No upper extremity supported, Feet supported Sitting balance-Leahy Scale: Good     Standing balance support: During functional activity Standing balance-Leahy Scale: Poor Standing balance comment: Requires UE support on RW. Total A for pericare by daughter                            Cognition Arousal/Alertness: Awake/alert Behavior During Therapy: WFL for tasks assessed/performed Overall  Cognitive Status: Within Functional Limits for tasks assessed                                          Exercises      General Comments General comments (skin integrity, edema, etc.): Sp02 dropped to 80s on RA, donned 1L and able to maintain SP02  >90% with activity. HR ranging from 140-166 bpm with activity.      Pertinent Vitals/Pain Pain Assessment Pain Assessment: Faces Faces Pain Scale: Hurts a little bit Pain Location: tail bone Pain Descriptors / Indicators: Sore, Discomfort Pain Intervention(s): Monitored during session, Repositioned    Home Living                          Prior Function            PT Goals (current goals can now be found in the care plan section) Progress towards PT goals: Progressing toward goals    Frequency    Min 3X/week      PT Plan Current plan remains appropriate    Co-evaluation              AM-PAC PT "6 Clicks" Mobility   Outcome Measure  Help needed turning from your back to your side while in a flat bed without using bedrails?: A Little Help needed moving from lying on your back to sitting on the side of a flat bed without using bedrails?: A Little Help needed moving to and from a bed to a chair (including a wheelchair)?: A Little Help needed standing up from a chair using your arms (e.g., wheelchair or bedside chair)?: A Little Help needed to walk in hospital room?: A Little Help needed climbing 3-5 steps with a railing? : A Lot 6 Click Score: 17    End of Session Equipment Utilized During Treatment: Gait belt;Oxygen Activity Tolerance: Patient tolerated treatment well Patient left: in chair;with call bell/phone within reach;with family/visitor present Nurse Communication: Mobility status PT Visit Diagnosis: Unsteadiness on feet (R26.81);Other abnormalities of gait and mobility (R26.89);Muscle weakness (generalized) (M62.81)     Time: 8099-8338 PT Time Calculation (min) (ACUTE ONLY): 36 min  Charges:  $Gait Training: 8-22 mins $Therapeutic Activity: 8-22 mins                     Vale Haven, PT, DPT Acute Rehabilitation Services Secure chat preferred Office [phone]      Blake Divine A Asa Fath 06/16/2022, 10:12 AM

## 2022-06-17 ENCOUNTER — Other Ambulatory Visit: Payer: Self-pay | Admitting: Cardiology

## 2022-06-17 DIAGNOSIS — R55 Syncope and collapse: Secondary | ICD-10-CM | POA: Diagnosis not present

## 2022-06-17 LAB — BASIC METABOLIC PANEL
Anion gap: 7 (ref 5–15)
BUN: 20 mg/dL (ref 8–23)
CO2: 22 mmol/L (ref 22–32)
Calcium: 8.6 mg/dL — ABNORMAL LOW (ref 8.9–10.3)
Chloride: 109 mmol/L (ref 98–111)
Creatinine, Ser: 1.24 mg/dL — ABNORMAL HIGH (ref 0.44–1.00)
GFR, Estimated: 41 mL/min — ABNORMAL LOW (ref >60)
Glucose, Bld: 103 mg/dL — ABNORMAL HIGH (ref 70–99)
Potassium: 3.4 mmol/L — ABNORMAL LOW (ref 3.5–5.1)
Sodium: 138 mmol/L (ref 135–145)

## 2022-06-17 LAB — CBC
HCT: 26.8 % — ABNORMAL LOW (ref 36.0–46.0)
Hemoglobin: 8.3 g/dL — ABNORMAL LOW (ref 12.0–15.0)
MCH: 29.6 pg (ref 26.0–34.0)
MCHC: 31 g/dL (ref 30.0–36.0)
MCV: 95.7 fL (ref 80.0–100.0)
Platelets: 147 10*3/uL — ABNORMAL LOW (ref 150–400)
RBC: 2.8 MIL/uL — ABNORMAL LOW (ref 3.87–5.11)
RDW: 13.2 % (ref 11.5–15.5)
WBC: 7.3 10*3/uL (ref 4.0–10.5)
nRBC: 0 % (ref 0.0–0.2)

## 2022-06-17 LAB — PROTIME-INR
INR: 2.2 — ABNORMAL HIGH (ref 0.8–1.2)
Prothrombin Time: 24.2 seconds — ABNORMAL HIGH (ref 11.4–15.2)

## 2022-06-17 MED ORDER — WARFARIN SODIUM 5 MG PO TABS: 5.0000 mg | ORAL_TABLET | Freq: Once | ORAL | Status: DC

## 2022-06-17 MED ORDER — CHOLESTYRAMINE 4 G PO PACK: 2.0000 g | PACK | Freq: Every day | ORAL | Status: DC | PRN

## 2022-06-17 NOTE — Progress Notes (Signed)
SATURATION QUALIFICATIONS: (This note is used to comply with regulatory documentation for home oxygen)  Patient Saturations on Room Air at Rest = 99%  Patient Saturations on Room Air while Ambulating = 79%  Patient Saturations on 3L Liters of oxygen while Ambulating = 86%  Please briefly explain why patient needs home oxygen:Pt desaturates on room air with mobility and requires supplemental oxygen to maintain a safe saturation level.  Lenora Boys. PTA Acute Rehabilitation Services Office: [phone]

## 2022-06-17 NOTE — Plan of Care (Signed)

## 2022-06-17 NOTE — Progress Notes (Signed)
Physical Therapy Treatment Patient Details Name: Carol Snyder MRN: [id number] DOB: May 03, 1929 Today's Date: 06/17/2022   History of Present Illness Pt adm 7/28 after syncopal episode with fall. PMH - CVA, THR, afib, pacer, orthostatic hypotension    PT Comments    Pt received OOB in chair on arrival and agreeable to session, however pt continues to be limited by fatigue and decreased endurance. Pt demonstrating steady ambulation with RW and min guard for safety with distance limited to pt stated tolerance. SpO2 dropping to upper 70s on RA during ambulation, donned 3L and SpO2 up to 86%, 99% at end of session at rest ON 2L. Educated pt and pt daughter re; use of pulse oximeter at home, activity recommendations and importance of continued mobility with pt and daughter verbalizing understanding. Pt continues to benefit from skilled PT services to progress toward functional mobility goals.    Recommendations for follow up therapy are one component of a multi-disciplinary discharge planning process, led by the attending physician.  Recommendations may be updated based on patient status, additional functional criteria and insurance authorization.  Follow Up Recommendations  Home health PT     Assistance Recommended at Discharge Frequent or constant Supervision/Assistance  Patient can return home with the following A little help with walking and/or transfers;A little help with bathing/dressing/bathroom;Assistance with cooking/housework;Assist for transportation;Help with stairs or ramp for entrance   Equipment Recommendations  None recommended by PT    Recommendations for Other Services       Precautions / Restrictions Precautions Precautions: Fall;Other (comment) Precaution Comments: watch BP/HR Restrictions Weight Bearing Restrictions: No     Mobility  Bed Mobility Overal bed mobility: Needs Assistance             General bed mobility comments: OOB in chair     Transfers Overall transfer level: Needs assistance Equipment used: Rolling walker (2 wheels) Transfers: Sit to/from Stand Sit to Stand: Min guard, Min assist           General transfer comment: Min guard for safety. from [facility] x1, from chair x1,  light min assist to steady from [facility]    Ambulation/Gait Ambulation/Gait assistance: Min guard Gait Distance (Feet): 80 Feet Assistive device: Rolling walker (2 wheels) Gait Pattern/deviations: Step-through pattern, Decreased step length - right, Decreased step length - left, Trunk flexed Gait velocity: decr     General Gait Details: Slow, steady gait with cues for upright posture and RW proximity. Sp02 dropped to upper 70s on RA, donned 3L and able to maintain SP02 up to 86% with activity. HR up to 156 bpm max with activity.   Stairs             Wheelchair Mobility    Modified Rankin (Stroke Patients Only)       Balance Overall balance assessment: Needs assistance Sitting-balance support: No upper extremity supported, Feet supported Sitting balance-Leahy Scale: Good     Standing balance support: During functional activity Standing balance-Leahy Scale: Poor Standing balance comment: Requires UE support on RW. Total A for pericare by daughter                            Cognition Arousal/Alertness: Awake/alert Behavior During Therapy: WFL for tasks assessed/performed Overall Cognitive Status: Within Functional Limits for tasks assessed  Exercises      General Comments        Pertinent Vitals/Pain Pain Assessment Pain Assessment: Faces Faces Pain Scale: Hurts a little bit Pain Location: general Pain Descriptors / Indicators: Sore Pain Intervention(s): Monitored during session, Limited activity within patient's tolerance    Home Living                          Prior Function            PT Goals (current goals can now be  found in the care plan section) Acute Rehab PT Goals PT Goal Formulation: With patient Time For Goal Achievement: 06/29/22    Frequency    Min 3X/week      PT Plan Current plan remains appropriate    Co-evaluation              AM-PAC PT "6 Clicks" Mobility   Outcome Measure  Help needed turning from your back to your side while in a flat bed without using bedrails?: A Little Help needed moving from lying on your back to sitting on the side of a flat bed without using bedrails?: A Little Help needed moving to and from a bed to a chair (including a wheelchair)?: A Little Help needed standing up from a chair using your arms (e.g., wheelchair or bedside chair)?: A Little Help needed to walk in hospital room?: A Little Help needed climbing 3-5 steps with a railing? : A Lot 6 Click Score: 17    End of Session Equipment Utilized During Treatment: Gait belt;Oxygen Activity Tolerance: Patient tolerated treatment well Patient left: in chair;with call bell/phone within reach;with family/visitor present Nurse Communication: Mobility status PT Visit Diagnosis: Unsteadiness on feet (R26.81);Other abnormalities of gait and mobility (R26.89);Muscle weakness (generalized) (M62.81)     Time: 3295-1884 PT Time Calculation (min) (ACUTE ONLY): 25 min  Charges:  $Gait Training: 8-22 mins $Therapeutic Activity: 8-22 mins                     Carol Snyder R. PTA Acute Rehabilitation Services Office: [phone]    Carol Snyder 06/17/2022, 12:41 PM

## 2022-06-17 NOTE — Progress Notes (Signed)
ANTICOAGULATION CONSULT NOTE  Pharmacy Consult for Warfarin  Indication: atrial fibrillation  Allergies  Allergen Reactions   Codeine Other (See Comments)    unknown   Enbrel [Etanercept] Other (See Comments)    unknown   Hydromorphone Other (See Comments)    unknown   Motrin [Ibuprofen] Other (See Comments)    Due to blood thinner    Sulfa Antibiotics Other (See Comments)    Childhood allergy    Voltaren [Diclofenac] Other (See Comments)    unknown   Naproxen Sodium Palpitations     Labs: Recent Labs    06/15/22 0332 06/16/22 0301 06/17/22 0329  HGB 9.0* 8.6* 8.3*  HCT 28.3* 27.2* 26.8*  PLT 141* 136* 147*  LABPROT 24.2* 26.2* 24.2*  INR 2.2* 2.4* 2.2*  CREATININE 1.28* 1.03* 1.24*     Estimated Creatinine Clearance: 22.8 mL/min (A) (by C-G formula based on SCr of 1.24 mg/dL (H)).   Assessment: 88 y/o F on warfarin PTA for afib. Presented to the ED with fall. CT head negative for bleed. Continuing warfarin. INR 2.3 on admission. Patient's daughter reports home repeating regimen of 2.5mg , 2.5mg , and 5mg  every 3 days.   INR 2.4 today  Goal of Therapy:  INR 2-3 Monitor platelets by anticoagulation protocol: Yes   Plan:  Give warfarin 5 mg tonight as per home regimen Monitor INR, CBC, signs/symptoms of bleeding daily   Thank you , PharmD  06/17/2022 8:13 AM

## 2022-06-17 NOTE — Progress Notes (Signed)
   06/16/22 2043  Assess: MEWS Score  Temp 98.3 F (36.8 C)  BP 105/77  MAP (mmHg) 87  Pulse Rate (!) 120  ECG Heart Rate (!) 120  Resp 20  Level of Consciousness Alert  SpO2 93 %  O2 Device Room Air  Assess: MEWS Score  MEWS Temp 0  MEWS Systolic 0  MEWS Pulse 2  MEWS RR 0  MEWS LOC 0  MEWS Score 2  MEWS Score Color Yellow  Assess: if the MEWS score is Yellow or Red  Were vital signs taken at a resting state? Yes  Focused Assessment No change from prior assessment  Does the patient meet 2 or more of the SIRS criteria? Yes  Does the patient have a confirmed or suspected source of infection? No  MEWS guidelines implemented *See Row Information* No, previously yellow, continue vital signs every 4 hours  Treat  Early Detection of Sepsis Score *See Row Information* Low  Assess: SIRS CRITERIA  SIRS Temperature  0  SIRS Pulse 1  SIRS Respirations  0  SIRS WBC 1  SIRS Score Sum  2

## 2022-06-17 NOTE — TOC Initial Note (Signed)
Transition of Care (TOC) - Initial/Assessment Note  Donn Pierini RN, BSN Transitions of Care Unit 4E- RN Case Manager See Treatment Team for direct phone #    Patient Details  Name: Carol Snyder MRN: [id number] Date of Birth: 03-25-1929  Transition of Care [facility]) CM/SW Contact:    Darrold Span, RN Phone Number: 06/17/2022, 3:01 PM  Clinical Narrative:                 Pt for potential d/c later today, watching for possible home 02 needs.  CM in to speak with pt and daughter at bedside to discuss [facility] and DME needs.  List provided for [facility] choice- Per CMS guidelines from medicare.gov website with star ratings (copy placed in shadow chart) - they have used [facility] earlier this year and would like to use them again.  Pt has needed DME at home, discussed possible home 02 and agencies that can services- daughter has selected [employer] if home 02 is needed.   Address, phone # and PCP all confirmed in epic.   Call made to [facility] for [facility] referral- referral has been accepted for RN/PT/OT.   TOC will follow for home 02 needs and refer to [employer] if orders placed.   Expected Discharge Plan: Home w Home Health Services Barriers to Discharge: No Barriers Identified   Patient Goals and CMS Choice Patient states their goals for this hospitalization and ongoing recovery are:: return home CMS Medicare.gov Compare Post Acute Care list provided to:: Patient Choice offered to / list presented to : Patient, Adult Children  Expected Discharge Plan and Services Expected Discharge Plan: Home w Home Health Services   Discharge Planning Services: CM Consult Post Acute Care Choice: Home Health, Durable Medical Equipment Living arrangements for the past 2 months: Single Family Home Expected Discharge Date: 06/15/22                 DME Agency: [employer]       HH Arranged: RN, PT, OT [facility] Agency: [facility] Date [facility] Agency Contacted: 06/17/22 Time HH Agency Contacted:  [phone] Representative spoke with at [facility] Agency: Victorino Dike  Prior Living Arrangements/Services Living arrangements for the past 2 months: Single Family Home Lives with:: Self, Adult Children Patient language and need for interpreter reviewed:: Yes Do you feel safe going back to the place where you live?: Yes      Need for Family Participation in Patient Care: Yes (Comment) Care giver support system in place?: Yes (comment) Current home services: DME Criminal Activity/Legal Involvement Pertinent to Current Situation/Hospitalization: No - Comment as needed  Activities of Daily Living Home Assistive Devices/Equipment: Cane (specify quad or straight), Walker (specify type) ADL Screening (condition at time of admission) Patient's cognitive ability adequate to safely complete daily activities?: Yes Is the patient deaf or have difficulty hearing?: No Does the patient have difficulty seeing, even when wearing glasses/contacts?: No Does the patient have difficulty concentrating, remembering, or making decisions?: No Patient able to express need for assistance with ADLs?: Yes Does the patient have difficulty dressing or bathing?: No Independently performs ADLs?: Yes (appropriate for developmental age) Does the patient have difficulty walking or climbing stairs?: Yes Weakness of Legs: Both Weakness of Arms/Hands: None  Permission Sought/Granted Permission sought to share information with : Facility Industrial/product designer granted to share information with : Yes, Verbal Permission Granted     Permission granted to share info w AGENCY: HH/DME  Emotional Assessment Appearance:: Appears stated age Attitude/Demeanor/Rapport: Engaged Affect (typically observed): Accepting, Appropriate, Pleasant Orientation: : Oriented to Self, Oriented to Place, Oriented to  Time, Oriented to Situation Alcohol / Substance Use: Not Applicable Psych Involvement: No (comment)  Admission diagnosis:   Syncope and collapse [R55] Fall [W19.XXXA] Hematoma of scalp, initial encounter [S00.03XA] Laceration of scalp due to birth trauma [P12.89] Syncope [R55] Patient Active Problem List   Diagnosis Date Noted   Syncope 06/16/2022   Scalp hematoma, initial encounter 06/14/2022   Chronic systolic CHF (congestive heart failure) (HCC) 06/14/2022   Syncope and collapse 06/14/2022   Sacral pain 06/14/2022   Acute on chronic systolic congestive heart failure (HCC) 07/05/2019   Hypokalemia 09/28/2018   Recurrent right pleural effusion 06/20/2018   Protein-calorie malnutrition, severe 06/07/2018   Dehydration    Laceration of right lower leg    Fall 06/06/2018   AKI (acute kidney injury) (HCC) 06/06/2018   Diarrhea 06/06/2018   Pacemaker 06/06/2018   Chronic anticoagulation 06/06/2018   Atrial fibrillation, chronic (HCC) 07/04/2011   PCP:  Kaleen Mask, MD Pharmacy:   [facility] [phone] - [place], [place] - [address] [place] [address] Phone: [phone] Fax: [phone]     Social Determinants of Health (SDOH) Interventions    Readmission Risk Interventions    06/17/2022   11:44 AM  Readmission Risk Prevention Plan  Transportation Screening Complete  PCP or Specialist Appt within 5-7 Days Complete  Home Care Screening Complete  Medication Review (RN CM) Complete

## 2022-06-17 NOTE — Discharge Summary (Signed)
Physician Discharge Summary  Carol Snyder [id number] DOB: 12/28/1928 DOA: 06/13/2022  PCP: Leonard Downing, MD  Admit date: 06/13/2022 Discharge date: 06/17/2022  Admitted From: Home Disposition:  Home  Recommendations for Outpatient Follow-up:  Follow up with PCP in 1-2 weeks Please obtain BMP/CBC in one week Follow-up with cardiology as scheduled in 2 weeks  Home Health: Home with home health, PT Equipment/Devices: Oxygen, 5 L with exertion, room air at rest  Discharge Condition: Stable CODE STATUS: DNR Diet recommendation: Regular diet  Brief/Interim Summary: Carol Snyder is a 88 y.o. female with medical history significant of ischemic cardiomyopathy, tachybradycardia syndrome with pacemaker, chronic atrial fibrillation on Coumadin, CVA, dementia who presents following a fall/syncopal event (recurrent).  Patient admitted as above with acute syncope and collapse with profound orthostatic hypotension and hypoxia with exertion.  Patient's home blood pressure medications were titrated off initially and slowly reinstated currently on low-dose digoxin and carvedilol with moderately improved symptoms, A-fib ongoing but RVR currently rate controlled.  CTA to evaluate given echocardiogram findings for pulmonary hypertension versus obstruction and elevated D-dimer however CTA was negative for filling defects, tiny pleural effusion noted.  Patient's home medications were titrated and resumed on Lasix although no signs of volume overload.  At this time patient is able to ambulate with minimal symptoms but ongoing hypoxia and dyspnea with exertion albeit improving with no further episodes of hypotension/syncope.   Continue new medication regimen and follow closely with PCP and cardiology- monitor signs/symptoms of hypoxia with exertion.  Discharge Diagnoses:  Principal Problem:   Syncope and collapse Active Problems:   Atrial fibrillation, chronic (HCC)   Fall   Pacemaker   Scalp  hematoma, initial encounter   Chronic systolic CHF (congestive heart failure) (HCC)   Sacral pain   Syncope    Discharge Instructions   Allergies as of 06/17/2022       Reactions   Codeine Other (See Comments)   unknown   Enbrel [etanercept] Other (See Comments)   unknown   Hydromorphone Other (See Comments)   unknown   Motrin [ibuprofen] Other (See Comments)   Due to blood thinner    Sulfa Antibiotics Other (See Comments)   Childhood allergy    Voltaren [diclofenac] Other (See Comments)   unknown   Naproxen Sodium Palpitations        Medication List     STOP taking these medications    metoprolol succinate 100 MG 24 hr tablet Commonly known as: TOPROL-XL       TAKE these medications    acetaminophen 500 MG tablet Commonly known as: TYLENOL Take 1,000 mg by mouth in the morning and at bedtime.   alendronate 70 MG tablet Commonly known as: FOSAMAX Take 70 mg by mouth once a week. Sundays   BIOFREEZE EX Apply 1 application topically daily as needed (pain).   carvedilol 3.125 MG tablet Commonly known as: COREG Take 1 tablet (3.125 mg total) by mouth 2 (two) times daily with a meal.   cholestyramine 4 g packet Commonly known as: QUESTRAN Take 2 g by mouth daily as needed (Diarrhea). Half pack daily   digoxin 0.125 MG tablet Commonly known as: LANOXIN Take 0.5 tablets (62.5 mcg total) by mouth daily. What changed: when to take this   fluticasone 50 MCG/ACT nasal spray Commonly known as: FLONASE Place 1 spray into both nostrils daily as needed for allergies.   furosemide 80 MG tablet Commonly known as: LASIX Take 1 tablet (80 mg total) by mouth  daily. What changed:  how much to take Another medication with the same name was removed. Continue taking this medication, and follow the directions you see here.   hydroxypropyl methylcellulose / hypromellose 2.5 % ophthalmic solution Commonly known as: ISOPTO TEARS / GONIOVISC Place 1 drop into both  eyes 3 (three) times daily as needed for dry eyes.   loperamide 2 MG capsule Commonly known as: IMODIUM Take 4 mg by mouth daily.   loratadine 10 MG tablet Commonly known as: CLARITIN Take 10 mg by mouth daily.   nitroGLYCERIN 0.4 MG SL tablet Commonly known as: NITROSTAT Place 1 tablet (0.4 mg total) under the tongue every 5 (five) minutes as needed for chest pain.   pantoprazole 40 MG tablet Commonly known as: Protonix Take 1 tablet (40 mg total) by mouth 2 (two) times daily. What changed: when to take this   potassium chloride SA 20 MEQ tablet Commonly known as: Klor-Con M20 Take 1 tablet (20 mEq total) by mouth daily.   Salonpas 3.11-22-08 % Ptch Generic drug: Camphor-Menthol-Methyl Sal Apply 1 patch topically daily as needed (pain).   sucralfate 1 GM/10ML suspension Commonly known as: Carafate Take 10 mLs (1 g total) by mouth 2 (two) times daily. What changed: when to take this   warfarin 5 MG tablet Commonly known as: COUMADIN Take 2.5-5 mg by mouth daily. Take 2.5 mg tablet by mouth Sun, Tues, Wed, Fri then take whole 5 mg tablet by moth rest of days per daughter        Allergies  Allergen Reactions   Codeine Other (See Comments)    unknown   Enbrel [Etanercept] Other (See Comments)    unknown   Hydromorphone Other (See Comments)    unknown   Motrin [Ibuprofen] Other (See Comments)    Due to blood thinner    Sulfa Antibiotics Other (See Comments)    Childhood allergy    Voltaren [Diclofenac] Other (See Comments)    unknown   Naproxen Sodium Palpitations    Consultations: None   Procedures/Studies: CT Angio Chest Pulmonary Embolism (PE) W or WO Contrast  Result Date: 06/15/2022 CLINICAL DATA:  A 88 year old female presents for evaluation of the chest, suspected pulmonary embolism in the setting of positive D-dimer. EXAM: CT ANGIOGRAPHY CHEST WITH CONTRAST TECHNIQUE: Multidetector CT imaging of the chest was performed using the standard protocol  during bolus administration of intravenous contrast. Multiplanar CT image reconstructions and MIPs were obtained to evaluate the vascular anatomy. RADIATION DOSE REDUCTION: This exam was performed according to the departmental dose-optimization program which includes automated exposure control, adjustment of the mA and/or kV according to patient size and/or use of iterative reconstruction technique. CONTRAST:  56mL OMNIPAQUE IOHEXOL 350 MG/ML SOLN COMPARISON:  Apr 09, 2022 FINDINGS: Cardiovascular: Calcified aortic atherosclerotic changes. Aorta not well assessed due to bolus timing on study optimized for assessment of pulmonary vasculature. Heart size is markedly enlarged mainly with RIGHT heart enlargement and LEFT atrial enlargement. No pericardial effusion or signs of pericardial nodularity. Main pulmonary artery is opacified to 506 Hounsfield units. The study is negative for pulmonary embolism. Mediastinum/Nodes: Patulous esophagus. No thoracic inlet adenopathy. No axillary lymphadenopathy. No mediastinal or hilar lymphadenopathy. Lungs/Pleura: Moderate RIGHT and trace LEFT pleural fluid collections layering dependently in the chest increased pleural fluid on the RIGHT slightly since previous imaging and development of LEFT-sided pleural effusion since previous imaging from March of 2023. No pneumothorax. Basilar volume loss associated with pleural fluid. No gross signs of pulmonary edema. Airways are  patent. Upper Abdomen: Reflux of contrast into hepatic veins. No acute upper abdominal process on very limited assessment. Musculoskeletal: Spinal degenerative changes. Osteopenia. Unchanged lower thoracic and upper lumbar compression fractures of superior endplates of 624THL and L1. similar endplate compression of T8 as well. Review of the MIP images confirms the above findings. IMPRESSION: 1. Negative for pulmonary embolism. 2. Signs of cardiac dysfunction with reflux of contrast into hepatic veins and cardiac  enlargement mainly LEFT atrium and RIGHT heart. 3. Increasing pleural fluid collections. No gross pulmonary edema at this time. Would however still correlate with signs of heart failure. 4. Compression fractures in the spine with similar appearance. 5. Patulous esophagus, correlate with any signs of esophageal dysmotility. Aortic Atherosclerosis (ICD10-I70.0). Electronically Signed   By: Zetta Bills M.D.   On: 06/15/2022 12:30   ECHOCARDIOGRAM COMPLETE  Result Date: 06/14/2022    ECHOCARDIOGRAM REPORT   Patient Name:   Carol Snyder Date of Exam: 06/14/2022 Medical Rec #:  [id number]   Height:       68.0 in Accession #:    [id number]  Weight:       105.3 lb Date of Birth:  November 13, 1929   BSA:          1.556 m Patient Age:    88 years    BP:           101/51 mmHg Patient Gender: F           HR:           102 bpm. Exam Location:  Inpatient Procedure: 2D Echo Indications:    syncope  History:        Patient has prior history of Echocardiogram examinations, most                 recent 07/05/2019. CHF, Pacemaker; Arrythmias:Atrial                 Fibrillation.  Sonographer:    Johny Chess RDCS Referring Phys: [id number] [place]  1. Left ventricular ejection fraction, by estimation, is 55 to 60%. The left ventricle has normal function. The left ventricle has no regional wall motion abnormalities. There is mild left ventricular hypertrophy. Left ventricular diastolic parameters are indeterminate. There is the interventricular septum is flattened in diastole ('D' shaped left ventricle), consistent with right ventricular volume overload.  2. Right ventricular systolic function is moderately reduced. The right ventricular size is moderately enlarged. There is moderately elevated pulmonary artery systolic pressure.  3. Left atrial size was severely dilated.  4. Right atrial size was severely dilated.  5. Mild to moderate mitral valve regurgitation.  6. Tricuspid valve regurgitation is mild to moderate.  7.  Likely did not capture highest mean gradient. The aortic valve is calcified. Aortic valve regurgitation is mild. Mild to moderate aortic valve stenosis.  8. The inferior vena cava is dilated in size with <50% respiratory variability, suggesting right atrial pressure of 15 mmHg. Conclusion(s)/Recommendation(s): Compared to prior echo, there is more pronounced septal flattening would r/o pulmonary embolism. Otherwise EF has improved. FINDINGS  Left Ventricle: Left ventricular ejection fraction, by estimation, is 55 to 60%. The left ventricle has normal function. The left ventricle has no regional wall motion abnormalities. The left ventricular internal cavity size was normal in size. There is  mild left ventricular hypertrophy. The interventricular septum is flattened in diastole ('D' shaped left ventricle), consistent with right ventricular volume overload. Left ventricular diastolic parameters are indeterminate. Right Ventricle: The  right ventricular size is moderately enlarged. Right ventricular systolic function is moderately reduced. There is moderately elevated pulmonary artery systolic pressure. The tricuspid regurgitant velocity is 2.97 m/s, and with an assumed right atrial pressure of 15 mmHg, the estimated right ventricular systolic pressure is 50.3 mmHg. Left Atrium: Left atrial size was severely dilated. Right Atrium: Right atrial size was severely dilated. Pericardium: The pericardium was not well visualized. Mitral Valve: Mild to moderate mitral annular calcification. Mild to moderate mitral valve regurgitation. Tricuspid Valve: Tricuspid valve regurgitation is mild to moderate. Aortic Valve: Likely did not capture highest mean gradient. The aortic valve is calcified. Aortic valve regurgitation is mild. Mild to moderate aortic stenosis is present. Aortic valve mean gradient measures 9.3 mmHg. Aortic valve peak gradient measures 16.6 mmHg. Aortic valve area, by VTI measures 0.82 cm. Pulmonic Valve:  Pulmonic valve regurgitation is trivial. Aorta: The aortic root and ascending aorta are structurally normal, with no evidence of dilitation. Venous: The inferior vena cava is dilated in size with less than 50% respiratory variability, suggesting right atrial pressure of 15 mmHg. Additional Comments: A device lead is visualized.  LEFT VENTRICLE PLAX 2D LVIDd:         4.10 cm LVIDs:         3.00 cm LV PW:         1.10 cm LV IVS:        0.90 cm LVOT diam:     1.80 cm LV SV:         27 LV SV Index:   17 LVOT Area:     2.54 cm  RIGHT VENTRICLE            IVC RV S prime:     8.38 cm/s  IVC diam: 2.10 cm LEFT ATRIUM             Index        RIGHT ATRIUM           Index LA diam:        4.30 cm 2.76 cm/m   RA Area:     34.70 cm LA Vol (A2C):   65.7 ml 42.22 ml/m  RA Volume:   136.00 ml 87.40 ml/m LA Vol (A4C):   74.7 ml 48.01 ml/m LA Biplane Vol: 75.7 ml 48.65 ml/m  AORTIC VALVE AV Area (Vmax):    0.95 cm AV Area (Vmean):   0.91 cm AV Area (VTI):     0.82 cm AV Vmax:           203.67 cm/s AV Vmean:          138.667 cm/s AV VTI:            0.327 m AV Peak Grad:      16.6 mmHg AV Mean Grad:      9.3 mmHg LVOT Vmax:         75.92 cm/s LVOT Vmean:        49.450 cm/s LVOT VTI:          0.105 m LVOT/AV VTI ratio: 0.32  AORTA Ao Root diam: 3.00 cm Ao Asc diam:  2.60 cm TRICUSPID VALVE TR Peak grad:   35.3 mmHg TR Vmax:        297.00 cm/s  SHUNTS Systemic VTI:  0.11 m Systemic Diam: 1.80 cm Carolan Clines Electronically signed by Carolan Clines Signature Date/Time: 06/14/2022/6:14:58 PM    Final    DG Pelvis Portable  Result Date: 06/14/2022 CLINICAL DATA:  Recent fall  with pelvic pain, initial encounter EXAM: PORTABLE PELVIS 1-2 VIEWS COMPARISON:  None Available. FINDINGS: Left hip prosthesis is noted. Pelvic ring appears intact. Significant degenerative changes of the right hip joint are noted. No acute fracture is seen. No soft tissue abnormality is noted. IMPRESSION: Chronic degenerative changes of the right hip joint. No  acute fracture is seen. Electronically Signed   By: Inez Catalina M.D.   On: 06/14/2022 01:20   CT CERVICAL SPINE WO CONTRAST  Result Date: 06/13/2022 CLINICAL DATA:  Recent fall with neck pain, initial encounter EXAM: CT CERVICAL SPINE WITHOUT CONTRAST TECHNIQUE: Multidetector CT imaging of the cervical spine was performed without intravenous contrast. Multiplanar CT image reconstructions were also generated. RADIATION DOSE REDUCTION: This exam was performed according to the departmental dose-optimization program which includes automated exposure control, adjustment of the mA and/or kV according to patient size and/or use of iterative reconstruction technique. COMPARISON:  04/09/2022 FINDINGS: Alignment: Stable in appearance from the prior exam. Mild anterolisthesis of C2 on C3 is noted with retrolisthesis of C3 on C4 and C4 on C5. Skull base and vertebrae: 7 cervical segments are well visualized. Vertebral body height is well maintained. Disc space narrowing is noted from C3-C6. Facet hypertrophic changes are noted as well as osteophytic changes. No acute fracture or acute facet abnormality is noted. The odontoid is within normal limits. Soft tissues and spinal canal: Surrounding soft tissue structures are within normal limits. Some air is noted within the jugular venous system likely related to prior IV initiation. Upper chest: No acute rib abnormality is noted. Small right-sided pleural effusion is seen. No focal confluent infiltrate is noted. Some apical scarring is noted bilaterally. Other: None IMPRESSION: Degenerative changes similar to that seen on prior exam. No acute bony abnormality is seen. Electronically Signed   By: Inez Catalina M.D.   On: 06/13/2022 21:22   CT HEAD WO CONTRAST  Result Date: 06/13/2022 CLINICAL DATA:  Trauma. EXAM: CT HEAD WITHOUT CONTRAST TECHNIQUE: Contiguous axial images were obtained from the base of the skull through the vertex without intravenous contrast. RADIATION DOSE  REDUCTION: This exam was performed according to the departmental dose-optimization program which includes automated exposure control, adjustment of the mA and/or kV according to patient size and/or use of iterative reconstruction technique. COMPARISON:  Head CT dated 06/05/2018. FINDINGS: Brain: Mild age-related atrophy and chronic microvascular ischemic changes. Small right basal ganglia old lacunar infarct. There is no acute intracranial hemorrhage. No mass effect or midline shift. No extra-axial fluid collection. Vascular: No hyperdense vessel or unexpected calcification. Skull: Normal. Negative for fracture or focal lesion. Sinuses/Orbits: There is partial opacification of the left maxillary sinus with air-fluid level. The remainder of the visualized paranasal sinuses and mastoid air cells are clear. Other: Right posterior parietal scalp laceration. A 4 mm radiopaque focus in the scalp adjacent to the laceration noted which is new since the CT of 2019. Clinical correlation recommended to evaluate for possibility of foreign object. IMPRESSION: 1. No acute intracranial pathology. 2. Mild age-related atrophy and chronic microvascular ischemic changes. 3. Right parietal scalp laceration and possible foreign fragment. Electronically Signed   By: Anner Crete M.D.   On: 06/13/2022 21:07   DG Chest Port 1 View  Result Date: 06/13/2022 CLINICAL DATA:  Recent fall with chest pain, initial encounter EXAM: PORTABLE CHEST 1 VIEW COMPARISON:  04/11/2022 FINDINGS: Cardiac shadow is enlarged but stable. Pacing device is again seen. Aortic calcifications are noted. The lungs are well aerated bilaterally. Mild right basilar  atelectasis is seen. No pneumothorax is noted. Old healed rib fracture of the right tenth rib posteriorly is noted and stable. IMPRESSION: Mild right basilar atelectasis. Stable cardiomegaly. Electronically Signed   By: Inez Catalina M.D.   On: 06/13/2022 20:55     Subjective: No acute issues/events  overnight   Discharge Exam: Vitals:   06/17/22 0340 06/17/22 0739  BP: 113/89 116/87  Pulse: (!) 135 73  Resp: 20 19  Temp: 97.8 F (36.6 C) 97.7 F (36.5 C)  SpO2: 99% 96%   Vitals:   06/17/22 0113 06/17/22 0118 06/17/22 0340 06/17/22 0739  BP: 122/86  113/89 116/87  Pulse: (!) 125  (!) 135 73  Resp: (!) 22 19 20 19   Temp:   97.8 F (36.6 C) 97.7 F (36.5 C)  TempSrc:   Oral Oral  SpO2: 97% 94% 99% 96%  Weight:      Height:        General: Pt is alert, awake, not in acute distress Cardiovascular: RRR, S1/S2 +, no rubs, no gallops Respiratory: CTA bilaterally, no wheezing, no rhonchi Abdominal: Soft, NT, ND, bowel sounds + Extremities: no edema, no cyanosis    The results of significant diagnostics from this hospitalization (including imaging, microbiology, ancillary and laboratory) are listed below for reference.     Microbiology: No results found for this or any previous visit (from the past 240 hour(s)).   Labs: BNP (last 3 results) No results for input(s): "BNP" in the last 8760 hours. Basic Metabolic Panel: Recent Labs  Lab 06/13/22 2029 06/13/22 2348 06/14/22 0209 06/15/22 0332 06/16/22 0301 06/17/22 0329  NA 140 138 139 138 139 138  K 4.7 4.8 4.7 5.0 3.8 3.4*  CL 106 106 109 105 107 109  CO2 26  --  22 27 24 22   GLUCOSE 106* 124* 132* 110* 96 103*  BUN 25* 28* 24* 23 16 20   CREATININE 1.25* 1.10* 1.15* 1.28* 1.03* 1.24*  CALCIUM 9.6  --  9.2 9.1 8.9 8.6*   Liver Function Tests: Recent Labs  Lab 06/13/22 2029  AST 20  ALT 16  ALKPHOS 58  BILITOT 0.4  PROT 7.1  ALBUMIN 4.2   No results for input(s): "LIPASE", "AMYLASE" in the last 168 hours. No results for input(s): "AMMONIA" in the last 168 hours. CBC: Recent Labs  Lab 06/13/22 2029 06/13/22 2348 06/14/22 0209 06/15/22 0332 06/16/22 0301 06/17/22 0329  WBC 8.3  --  11.5* 8.1 7.5 7.3  HGB 10.0* 9.9* 9.6* 9.0* 8.6* 8.3*  HCT 32.5* 29.0* 30.0* 28.3* 27.2* 26.8*  MCV 95.9  --   94.3 93.1 93.8 95.7  PLT 167  --  139* 141* 136* 147*   Cardiac Enzymes: No results for input(s): "CKTOTAL", "CKMB", "CKMBINDEX", "TROPONINI" in the last 168 hours. BNP: Invalid input(s): "POCBNP" CBG: No results for input(s): "GLUCAP" in the last 168 hours. D-Dimer Recent Labs    06/15/22 0332  DDIMER 4.39*   Hgb A1c No results for input(s): "HGBA1C" in the last 72 hours. Lipid Profile No results for input(s): "CHOL", "HDL", "LDLCALC", "TRIG", "CHOLHDL", "LDLDIRECT" in the last 72 hours. Thyroid function studies No results for input(s): "TSH", "T4TOTAL", "T3FREE", "THYROIDAB" in the last 72 hours.  Invalid input(s): "FREET3" Anemia work up No results for input(s): "VITAMINB12", "FOLATE", "FERRITIN", "TIBC", "IRON", "RETICCTPCT" in the last 72 hours. Urinalysis    Component Value Date/Time   COLORURINE YELLOW 06/14/2022 0530   APPEARANCEUR CLEAR 06/14/2022 0530   LABSPEC 1.010 06/14/2022 0530   PHURINE  5.0 06/14/2022 0530   GLUCOSEU NEGATIVE 06/14/2022 0530   HGBUR NEGATIVE 06/14/2022 0530   BILIRUBINUR NEGATIVE 06/14/2022 0530   KETONESUR NEGATIVE 06/14/2022 0530   PROTEINUR NEGATIVE 06/14/2022 0530   UROBILINOGEN 0.2 05/15/2007 1442   NITRITE NEGATIVE 06/14/2022 0530   LEUKOCYTESUR MODERATE (A) 06/14/2022 0530   Sepsis Labs Recent Labs  Lab 06/14/22 0209 06/15/22 0332 06/16/22 0301 06/17/22 0329  WBC 11.5* 8.1 7.5 7.3   Microbiology No results found for this or any previous visit (from the past 240 hour(s)).   Time coordinating discharge: Over 30 minutes  SIGNED:   Little Ishikawa, DO [facility] 06/17/2022, 8:27 AM Pager   If 7PM-7AM, please contact night-coverage [email or web address]

## 2022-06-17 NOTE — Progress Notes (Signed)
   06/17/22 0040  Assess: MEWS Score  Temp 97.8 F (36.6 C)  BP (!) 104/92  MAP (mmHg) 97  ECG Heart Rate (!) 123  Resp 20  SpO2 92 %  O2 Device Room Air  Assess: MEWS Score  MEWS Temp 0  MEWS Systolic 0  MEWS Pulse 2  MEWS RR 0  MEWS LOC 0  MEWS Score 2  MEWS Score Color Yellow  Notify: Provider  Provider Name/Title T. Opyd  Date Provider Notified 06/17/22  Time Provider Notified [phone]  Method of Notification  (amion)  Notification Reason Other (Comment) (4 beats of vtach, k and mag. last vitals. heart rate 103-145)  Provider response No new orders  Date of Provider Response 06/17/22  Time of Provider Response 0100  Assess: SIRS CRITERIA  SIRS Temperature  0  SIRS Pulse 1  SIRS Respirations  0  SIRS WBC 1  SIRS Score Sum  2

## 2022-06-17 NOTE — TOC Transition Note (Addendum)
Transition of Care [facility]) - CM/SW Discharge Note   Patient Details  Name: Carol Snyder MRN: [id number] Date of Birth: [birth year]  Transition of [facility] CM/SW Contact:  Lawerance Sabal, RN Phone Number: 06/17/2022, 3:49 PM   Clinical Narrative:    Sherron Monday w MD, anticipate DC to home today and home oxygen requirement. MD to place orders. Notified Jermaine with [employer] that patient will need home oxygen and oxygen for transport home. Oxygen to be delivered to the hospital room prior to DC [facility] set up w [facility]. No other TOC needs identified for DC    Final next level of care: Home w Home Health Services Barriers to Discharge: No Barriers Identified   Patient Goals and CMS Choice Patient states their goals for this hospitalization and ongoing recovery are:: return home CMS Medicare.gov Compare Post Acute Care list provided to:: Patient Choice offered to / list presented to : Patient, Adult Children  Discharge Placement                       Discharge Plan and Services   Discharge Planning Services: CM Consult Post Acute Care Choice: Home Health, Durable Medical Equipment          DME Arranged: Oxygen DME Agency: [employer] Date DME Agency Contacted: 06/17/22 Time DME Agency Contacted: 1530 Representative spoke with at DME Agency: Vaughan Basta HH Arranged: RN, PT, OT HH Agency: [facility] Date [facility] Agency Contacted: 06/17/22 Time HH Agency Contacted: 1144 Representative spoke with at [facility] Agency: Victorino Dike  Social Determinants of Health (SDOH) Interventions     Readmission Risk Interventions    06/17/2022   11:44 AM  Readmission Risk Prevention Plan  Transportation Screening Complete  PCP or Specialist Appt within 5-7 Days Complete  Home Care Screening Complete  Medication Review (RN CM) Complete

## 2022-06-17 NOTE — Plan of Care (Signed)
Pt is alert and oriented x 4. Periods of forgetfulness during the night. Pt had 4 beats of vtach, pacemaker kicked in. Vitals assessed. Pt asymptomatic. Later pt became short of breath. Oxygen applied at 1 L. Sats 89-94% room air. With oxygen 1L/Cla[place] pt more comfortable no shortness of breath. MD T. Opyd aware of both episodes. Requested to continue supplemental oxygen. HR flucuating from 100-145. Periodically dropped few times to 70's.  Problem: Nutrition: Goal: Adequate nutrition will be maintained Outcome: Not Progressing   Problem: Education: Goal: Knowledge of General Education information will improve Description: Including pain rating scale, medication(s)/side effects and non-pharmacologic comfort measures Outcome: Progressing   Problem: Health Behavior/Discharge Planning: Goal: Ability to manage health-related needs will improve Outcome: Progressing   Problem: Clinical Measurements: Goal: Ability to maintain clinical measurements within normal limits will improve Outcome: Progressing Goal: Will remain free from infection Outcome: Progressing Goal: Diagnostic test results will improve Outcome: Progressing Goal: Respiratory complications will improve Outcome: Progressing Goal: Cardiovascular complication will be avoided Outcome: Progressing   Problem: Activity: Goal: Risk for activity intolerance will decrease Outcome: Progressing   Problem: Coping: Goal: Level of anxiety will decrease Outcome: Progressing   Problem: Elimination: Goal: Will not experience complications related to bowel motility Outcome: Progressing Goal: Will not experience complications related to urinary retention Outcome: Progressing   Problem: Pain Managment: Goal: General experience of comfort will improve Outcome: Progressing   Problem: Safety: Goal: Ability to remain free from injury will improve Outcome: Progressing   Problem: Skin Integrity: Goal: Risk for impaired skin integrity will  decrease Outcome: Progressing

## 2022-06-17 NOTE — Progress Notes (Signed)
Patient  o2 saturations on room air at rest 100%  Patient O2 saturations on room air while ambulating 79%  Patient o2 saturation on 6L o2 while ambulating 90%

## 2022-07-05 NOTE — Progress Notes (Deleted)
Cardiology Office Note Date:  07/05/2022  Patient ID:  Carol Snyder, Carol Snyder 1929/03/08, MRN [id number] PCP:  Kaleen Mask, MD  Cardiologist:  Dr. Anne Fu Electrophysiologist: Allred    Chief Complaint: *** over due device visit  History of Present Illness: Carol Snyder is a 88 y.o. female with history of permanent AFib,  SSSx w/PPM, stroke, chronic CHF (systolic)  He comes in today to be seen for Dr. Johney Frame, last seen by him 2019, discussed on warfarin, declined OAC. No changes were made, planned for annual EP APP visits.  Note during a hospital stay her CLS pacing was programmed off 2/2 pacing in the 120's >> VVI 50  She has been followed by Dr. Anne Fu and his team since then.  Most recently by Demetra Shiner, PA 02/06/21, doing well, no changes were made.  I saw her 02/21/21 She is accompanied by her daughter She reports no concerns, denies any CP, palpitations or cardiac awareness No dizzy spells, near syncope or syncope. She has poor appetite and losing weight Daughter mentions other GI issues, diarrhea. Following with IM Follows warfarin with her PMD Labs done there regularly No bleeding or signs of bleeding Discussed getting dig level, they preferred her labs be done via PMD and planned for it to be done there Did not want to switch to [facility]  ER visit 04/11/22: just discharged from [facility] hospital with pleural effusion that was drained, went to the ER with postural dizziness, via EMS, with them apparently had rapid Afib with position changes and syncope.  She was gove IVF, labs  Device interrogated without arrhythmias noted, suspect over diuresed and her lasix reduced  Admitted 06/13/22 - 06/17/22 with syncope and profound orthostatic hypotension, also noted to have hypoxia with ambulation, home meds held initially , in AFib w/RVR that improved,  CTA to evaluate given echocardiogram findings for pulmonary hypertension versus obstruction and elevated D-dimer however CTA was negative for  filling defects, tiny pleural effusion noted. Eventually home meds added back on and was able to ambulate and discharged  *** needs cards *** rates... *** orthostatics? *** volume   Device information Biotronik dual chamber PPM, implanted 06/04/2016 by Dr. Rudolpho Sevin   Past Medical History:  Diagnosis Date   Anemia    Aortic insufficiency    Chest pain    Chronic diarrhea    DNR (do not resuscitate)    Dysrhythmia    afib   Falling    History of GI bleed    History of shingles    Long term current use of anticoagulant    Mitral regurgitation    Orthostatic hypotension    Persistent atrial fibrillation (HCC)    on Coumadin   Pleural effusion    Sinus node dysfunction (HCC)    a. s/p Biotronik PPM.   Stroke [facility])    Tricuspid regurgitation     Past Surgical History:  Procedure Laterality Date   APPENDECTOMY     BIOPSY  08/13/2021   Procedure: BIOPSY;  Surgeon: Kathi Der, MD;  Location: [facility] ENDOSCOPY;  Service: Gastroenterology;;   CHOLECYSTECTOMY     with partial colectomy do to gallbadder infection    ESOPHAGOGASTRODUODENOSCOPY (EGD) WITH PROPOFOL N/A 08/13/2021   Procedure: ESOPHAGOGASTRODUODENOSCOPY (EGD) WITH PROPOFOL;  Surgeon: Kathi Der, MD;  Location: [facility] ENDOSCOPY;  Service: Gastroenterology;  Laterality: N/A;   IR THORACENTESIS ASP PLEURAL SPACE W/IMG GUIDE  06/22/2018   IR THORACENTESIS ASP PLEURAL SPACE W/IMG GUIDE  09/29/2018   IR THORACENTESIS ASP  PLEURAL SPACE W/IMG GUIDE  07/05/2019   PACEMAKER IMPLANT  06/04/2016   BTK [place]ora 8 DR-T implanted by Dr Rudolpho Sevin at [facility] for sick sinus syndrome    Current Outpatient Medications  Medication Sig Dispense Refill   acetaminophen (TYLENOL) 500 MG tablet Take 1,000 mg by mouth in the morning and at bedtime.     alendronate (FOSAMAX) 70 MG tablet Take 70 mg by mouth once a week. Sundays     Camphor-Menthol-Methyl Sal (SALONPAS) 3.11-22-08 % PTCH Apply 1 patch topically daily as needed (pain).      carvedilol (COREG) 3.125 MG tablet Take 1 tablet (3.125 mg total) by mouth 2 (two) times daily with a meal. 60 tablet 0   cholestyramine (QUESTRAN) 4 g packet Take 2 g by mouth daily as needed (Diarrhea). Half pack daily     digoxin (LANOXIN) 0.125 MG tablet Take 0.5 tablets (62.5 mcg total) by mouth daily. (Patient taking differently: Take 62.5 mcg by mouth every other day.) 7 tablet 0   fluticasone (FLONASE) 50 MCG/ACT nasal spray Place 1 spray into both nostrils daily as needed for allergies.     furosemide (LASIX) 80 MG tablet Take 1 tablet (80 mg total) by mouth daily. (Patient taking differently: Take 40 mg by mouth daily.) 90 tablet 3   hydroxypropyl methylcellulose / hypromellose (ISOPTO TEARS / GONIOVISC) 2.5 % ophthalmic solution Place 1 drop into both eyes 3 (three) times daily as needed for dry eyes.     loperamide (IMODIUM) 2 MG capsule Take 4 mg by mouth daily.     loratadine (CLARITIN) 10 MG tablet Take 10 mg by mouth daily.     Menthol, Topical Analgesic, (BIOFREEZE EX) Apply 1 application topically daily as needed (pain).     nitroGLYCERIN (NITROSTAT) 0.4 MG SL tablet Place 1 tablet (0.4 mg total) under the tongue every 5 (five) minutes as needed for chest pain. 25 tablet 6   pantoprazole (PROTONIX) 40 MG tablet Take 1 tablet (40 mg total) by mouth 2 (two) times daily. (Patient taking differently: Take 40 mg by mouth daily.) 180 tablet 0   potassium chloride SA (KLOR-CON M20) 20 MEQ tablet Take 1 tablet (20 mEq total) by mouth daily. 15 tablet 0   sucralfate (CARAFATE) 1 GM/10ML suspension Take 10 mLs (1 g total) by mouth 2 (two) times daily. (Patient taking differently: Take 1 g by mouth daily.) 600 mL 0   warfarin (COUMADIN) 5 MG tablet Take 2.5-5 mg by mouth daily. Take 2.5 mg tablet by mouth Sun, Tues, Wed, Fri then take whole 5 mg tablet by moth rest of days per daughter     No current facility-administered medications for this visit.    Allergies:   Codeine, Enbrel  [etanercept], Hydromorphone, Motrin [ibuprofen], Sulfa antibiotics, Voltaren [diclofenac], and Naproxen sodium   Social History:  The patient  reports that she quit smoking about 17 years ago. Her smoking use included cigarettes. She has never used smokeless tobacco. She reports that she does not drink alcohol and does not use drugs.   Family History:  The patient's family history includes Arrhythmia in her daughter; Cancer in her brother; Heart attack in her father; Hypertension in her father; Stroke in her mother.  ROS:  Please see the history of present illness.    All other systems are reviewed and otherwise negative.   PHYSICAL EXAM:  VS:  There were no vitals taken for this visit. BMI: There is no height or weight on file to calculate  BMI. Well nourished, well developed, in no acute distress HEENT: normocephalic, atraumatic Neck: no JVD, carotid bruits or masses Cardiac:  *** RRR; no significant murmurs, no rubs, or gallops Lungs:  *** CTA b/l, no wheezing, rhonchi or rales Abd: soft, nontender MS: no deformity, advaned atrophy Ext: *** no edema Skin: warm and dry, no rash Neuro:  No gross deficits appreciated Psych: euthymic mood, full affect  *** PPM site is stable, no tethering or discomfort, skin is intact, no thinning   EKG:  Not done today  Device interrogation done today and reviewed by myself:  ***   06/14/2022: TTE  1. Left ventricular ejection fraction, by estimation, is 55 to 60%. The  left ventricle has normal function. The left ventricle has no regional  wall motion abnormalities. There is mild left ventricular hypertrophy.  Left ventricular diastolic parameters  are indeterminate. There is the interventricular septum is flattened in  diastole ('D' shaped left ventricle), consistent with right ventricular  volume overload.   2. Right ventricular systolic function is moderately reduced. The right  ventricular size is moderately enlarged. There is moderately  elevated  pulmonary artery systolic pressure.   3. Left atrial size was severely dilated.   4. Right atrial size was severely dilated.   5. Mild to moderate mitral valve regurgitation.   6. Tricuspid valve regurgitation is mild to moderate.   7. Likely did not capture highest mean gradient. The aortic valve is  calcified. Aortic valve regurgitation is mild. Mild to moderate aortic  valve stenosis.   8. The inferior vena cava is dilated in size with <50% respiratory  variability, suggesting right atrial pressure of 15 mmHg.   Conclusion(s)/Recommendation(s): Compared to prior echo, there is more  pronounced septal flattening would r/o pulmonary embolism. Otherwise EF  has improved.    stress 06/2019 There was no ST segment deviation noted during stress. T wave inversion was noted during stress in the II, III and aVF leads. Defect 1: There is a large defect of moderate severity present in the basal inferoseptal, basal inferior, basal inferolateral, mid inferoseptal, mid inferior and mid inferolateral location. Findings consistent with subendocardial MI with peri-infarct ischemia of the inferior . inferioseptal and inferolateral walls. Nuclear stress EF: 33%. The left ventricular ejection fraction is moderately decreased (30-44%). This is a high risk study.     Echo 06/2019  1. The left ventricle has severely reduced systolic function, with an  ejection fraction of 25-30%. The cavity size was normal. Left ventricular  diastolic function could not be evaluated secondary to atrial  fibrillation. Left ventricular diffuse  hypokinesis.   2. Severe akinesis of the left ventricular, entire inferior wall.   3. The right ventricle has normal systolic function. The cavity was  moderately enlarged. There is no increase in right ventricular wall  thickness.   4. Left atrial size was severely dilated.   5. Right atrial size was severely dilated.   6. Large pleural effusion in the right lateral  region.   7. The mitral valve is abnormal. Moderate thickening of the mitral valve  leaflet. Mitral valve regurgitation is moderate to severe by color flow  Doppler.   8. The tricuspid valve is grossly normal. Tricuspid valve regurgitation  is mild-moderate.   9. The aortic valve is tricuspid. Mild calcification of the aortic valve.  Aortic valve regurgitation is mild by color flow Doppler. Mild-moderate  stenosis of the aortic valve.  10. The aorta is normal unless otherwise noted.  11. The  inferior vena cava was dilated in size with <50% respiratory  variability.     Recent Labs: 06/13/2022: ALT 16 06/17/2022: BUN 20; Creatinine, Ser 1.24; Hemoglobin 8.3; Platelets 147; Potassium 3.4; Sodium 138  No results found for requested labs within last 365 days.   CrCl cannot be calculated (Unknown ideal weight.).   Wt Readings from Last 3 Encounters:  06/14/22 105 lb 4.8 oz (47.8 kg)  04/11/22 90 lb (40.8 kg)  08/13/21 87 lb 1.3 oz (39.5 kg)     Other studies reviewed: Additional studies/records reviewed today include: summarized above  ASSESSMENT AND PLAN:  1. PPM     *** Intact function     *** no programming changes made  2. Permanent AFib     CHA2DS2Vasc is 6, on warfarin, *** monitored and managed by her PMD     She prefers staying with warfarin, has been offered the switch previously  3. chronic CHF (systolic)     *** No symptoms or exam findings of volume OL     *** On BB, dig, furosemide     ***      C/w Dr. Anne Fu   Disposition: ***  Current medicines are reviewed at length with the patient today.  The patient did not have any concerns regarding medicines.  Norma Fredrickson, PA-C 07/05/2022 9:39 AM     [facility] [address] [place] [address] [phone] (office)  [phone] (fax)

## 2022-07-07 ENCOUNTER — Encounter (HOSPITAL_COMMUNITY): Payer: Self-pay

## 2022-07-07 ENCOUNTER — Emergency Department (HOSPITAL_COMMUNITY)
Admission: EM | Admit: 2022-07-07 | Discharge: 2022-07-07 | Disposition: A | Discharge status: Home / Self Care | Payer: Medicare PPO | Attending: Emergency Medicine | Admitting: Emergency Medicine

## 2022-07-07 ENCOUNTER — Encounter: Payer: Medicare PPO | Admitting: Physician Assistant

## 2022-07-07 ENCOUNTER — Other Ambulatory Visit: Payer: Self-pay

## 2022-07-07 ENCOUNTER — Emergency Department (HOSPITAL_COMMUNITY): Payer: Medicare PPO

## 2022-07-07 DIAGNOSIS — I509 Heart failure, unspecified: Secondary | ICD-10-CM | POA: Diagnosis not present

## 2022-07-07 DIAGNOSIS — M79605 Pain in left leg: Secondary | ICD-10-CM | POA: Insufficient documentation

## 2022-07-07 DIAGNOSIS — I4891 Unspecified atrial fibrillation: Secondary | ICD-10-CM | POA: Insufficient documentation

## 2022-07-07 DIAGNOSIS — M25551 Pain in right hip: Secondary | ICD-10-CM | POA: Insufficient documentation

## 2022-07-07 DIAGNOSIS — Z950 Presence of cardiac pacemaker: Secondary | ICD-10-CM | POA: Diagnosis not present

## 2022-07-07 DIAGNOSIS — Z7901 Long term (current) use of anticoagulants: Secondary | ICD-10-CM | POA: Insufficient documentation

## 2022-07-07 LAB — CBC WITH DIFFERENTIAL/PLATELET
Abs Immature Granulocytes: 0.04 10*3/uL (ref 0.00–0.07)
Basophils Absolute: 0.1 10*3/uL (ref 0.0–0.1)
Basophils Relative: 1 %
Eosinophils Absolute: 0.3 10*3/uL (ref 0.0–0.5)
Eosinophils Relative: 4 %
HCT: 27.4 % — ABNORMAL LOW (ref 36.0–46.0)
Hemoglobin: 8.7 g/dL — ABNORMAL LOW (ref 12.0–15.0)
Immature Granulocytes: 1 %
Lymphocytes Relative: 23 %
Lymphs Abs: 1.4 10*3/uL (ref 0.7–4.0)
MCH: 30 pg (ref 26.0–34.0)
MCHC: 31.8 g/dL (ref 30.0–36.0)
MCV: 94.5 fL (ref 80.0–100.0)
Monocytes Absolute: 0.6 10*3/uL (ref 0.1–1.0)
Monocytes Relative: 9 %
Neutro Abs: 3.9 10*3/uL (ref 1.7–7.7)
Neutrophils Relative %: 62 %
Platelets: 235 10*3/uL (ref 150–400)
RBC: 2.9 MIL/uL — ABNORMAL LOW (ref 3.87–5.11)
RDW: 13.3 % (ref 11.5–15.5)
WBC: 6.3 10*3/uL (ref 4.0–10.5)
nRBC: 0 % (ref 0.0–0.2)

## 2022-07-07 LAB — COMPREHENSIVE METABOLIC PANEL
ALT: 5 U/L (ref 0–44)
AST: 21 U/L (ref 15–41)
Albumin: 3.2 g/dL — ABNORMAL LOW (ref 3.5–5.0)
Alkaline Phosphatase: 133 U/L — ABNORMAL HIGH (ref 38–126)
Anion gap: 8 (ref 5–15)
BUN: 21 mg/dL (ref 8–23)
CO2: 21 mmol/L — ABNORMAL LOW (ref 22–32)
Calcium: 8.7 mg/dL — ABNORMAL LOW (ref 8.9–10.3)
Chloride: 110 mmol/L (ref 98–111)
Creatinine, Ser: 1.1 mg/dL — ABNORMAL HIGH (ref 0.44–1.00)
GFR, Estimated: 47 mL/min — ABNORMAL LOW (ref >60)
Glucose, Bld: 83 mg/dL (ref 70–99)
Potassium: 4.9 mmol/L (ref 3.5–5.1)
Sodium: 139 mmol/L (ref 135–145)
Total Bilirubin: 0.8 mg/dL (ref 0.3–1.2)
Total Protein: 6 g/dL — ABNORMAL LOW (ref 6.5–8.1)

## 2022-07-07 LAB — PROTIME-INR
INR: 3.8 — ABNORMAL HIGH (ref 0.8–1.2)
Prothrombin Time: 36.9 seconds — ABNORMAL HIGH (ref 11.4–15.2)

## 2022-07-07 MED ORDER — WARFARIN - PHARMACIST DOSING INPATIENT: Freq: Every day | Status: DC

## 2022-07-07 MED ORDER — LIDOCAINE 5 % EX PTCH
1.0000 | MEDICATED_PATCH | CUTANEOUS | Status: DC
  Administered 2022-07-07: 1 via TRANSDERMAL
  Filled 2022-07-07: qty 1

## 2022-07-07 MED ORDER — CARVEDILOL 3.125 MG PO TABS: 3.1250 mg | ORAL_TABLET | Freq: Two times a day (BID) | ORAL | Status: DC

## 2022-07-07 MED ORDER — WARFARIN SODIUM 2.5 MG PO TABS: 2.5000 mg | ORAL_TABLET | Freq: Every day | ORAL | Status: DC

## 2022-07-07 MED ORDER — METHOCARBAMOL 500 MG PO TABS
500.0000 mg | ORAL_TABLET | Freq: Once | ORAL | Status: AC
  Administered 2022-07-07: 500 mg via ORAL
  Filled 2022-07-07: qty 1

## 2022-07-07 MED ORDER — ACETAMINOPHEN 500 MG PO TABS: 1000.0000 mg | ORAL_TABLET | Freq: Four times a day (QID) | ORAL | Status: DC | PRN

## 2022-07-07 MED ORDER — DIGOXIN 125 MCG PO TABS: 62.5000 ug | ORAL_TABLET | ORAL | Status: DC

## 2022-07-07 MED ORDER — FUROSEMIDE 20 MG PO TABS
40.0000 mg | ORAL_TABLET | Freq: Every day | ORAL | Status: DC
  Administered 2022-07-07: 40 mg via ORAL
  Filled 2022-07-07: qty 2

## 2022-07-07 MED ORDER — PANTOPRAZOLE SODIUM 40 MG PO TBEC
40.0000 mg | DELAYED_RELEASE_TABLET | Freq: Every day | ORAL | Status: DC
  Administered 2022-07-07: 40 mg via ORAL
  Filled 2022-07-07: qty 1

## 2022-07-07 MED ORDER — DIGOXIN 125 MCG PO TABS
62.5000 ug | ORAL_TABLET | ORAL | Status: DC
  Filled 2022-07-07: qty 1

## 2022-07-07 MED ORDER — METHOCARBAMOL 500 MG PO TABS: 500.0000 mg | ORAL_TABLET | Freq: Once | ORAL | 0 refills | Status: DC

## 2022-07-07 MED ORDER — METHOCARBAMOL 500 MG PO TABS: 500.0000 mg | ORAL_TABLET | Freq: Once | ORAL | 0 refills | Status: AC

## 2022-07-07 MED ORDER — HYDROCODONE-ACETAMINOPHEN 5-325 MG PO TABS: 1.0000 | ORAL_TABLET | Freq: Once | ORAL | Status: DC

## 2022-07-07 MED ORDER — ACETAMINOPHEN 325 MG PO TABS
650.0000 mg | ORAL_TABLET | Freq: Once | ORAL | Status: AC
  Administered 2022-07-07: 650 mg via ORAL
  Filled 2022-07-07: qty 2

## 2022-07-07 MED ORDER — POTASSIUM CHLORIDE CRYS ER 20 MEQ PO TBCR
20.0000 meq | EXTENDED_RELEASE_TABLET | Freq: Every day | ORAL | Status: DC
  Administered 2022-07-07: 20 meq via ORAL
  Filled 2022-07-07: qty 1

## 2022-07-07 MED ORDER — SUCRALFATE 1 GM/10ML PO SUSP
1.0000 g | Freq: Every day | ORAL | Status: DC
  Administered 2022-07-07: 1 g via ORAL
  Filled 2022-07-07: qty 10

## 2022-07-07 NOTE — ED Provider Notes (Signed)
Pt is a 88 year old F who presents the Emergency Department with hip pain.  Work-up per prior team has been unrevealing with regards to acute pathology.  No fracture identified.  Social work has been consulted with prior team to help patient with placement as she has been unable to ambulate at home and has been less and less able to perform ADLs.  Of note patient lives with daughter who has been helping care for her.  Physical Exam  BP 129/86 (BP Location: Right Arm)   Pulse (!) 111   Temp 98 F (36.7 C) (Oral)   Resp 20   Ht 5\' 8"  (1.727 m)   Wt 47.6 kg   SpO2 96%   BMI 15.97 kg/m   Physical Exam Vitals and nursing note reviewed.  Constitutional:      General: She is not in acute distress.    Appearance: She is well-developed.     Comments: Upon entering the exam room, the patient is lying in bed awake and alert no acute distress.  Well-appearing  HENT:     Head: Normocephalic and atraumatic.  Eyes:     Conjunctiva/sclera: Conjunctivae normal.  Cardiovascular:     Rate and Rhythm: Normal rate and regular rhythm.     Heart sounds: No murmur heard. Pulmonary:     Effort: Pulmonary effort is normal. No respiratory distress.     Breath sounds: Normal breath sounds.  Abdominal:     Palpations: Abdomen is soft.     Tenderness: There is no abdominal tenderness.  Musculoskeletal:        General: No swelling.     Cervical back: Neck supple.  Skin:    General: Skin is warm and dry.     Capillary Refill: Capillary refill takes less than 2 seconds.  Neurological:     Mental Status: She is alert.  Psychiatric:        Mood and Affect: Mood normal.     Procedures  Procedures  ED Course / MDM    Medical Decision Making Amount and/or Complexity of Data Reviewed Labs: ordered. Radiology: ordered.  Risk OTC drugs. Prescription drug management.   Plan at the time of handoff includes pursue skilled nursing facility placement.  I have personally discussed the patient's case  with social work who states that it may take approximately 2 days to place patient to skilled nursing facility.  I have discussed this with family who wishes to go home instead.  Per discussion with social work, if patient goes home her daughter must reach out to PCP for them to arrange for skilled nursing facility placement.  Patient's daughter verbalized agreement understanding with plan to reach out to primary care physician to arrange skilled nursing facility placement.  Patient's daughter states she feels safe bringing the patient home and is able to continue caring for her.  She wishes to pursue going home to avoid having her mother wait in the emergency department for several days.  Feel this is appropriate.  Patient was discharged per daughter's request with instructions to follow-up with a primary care physician for placement.       , MD 07/07/22 07/09/22    [id number], MD 07/08/22 07/10/22

## 2022-07-07 NOTE — Discharge Instructions (Addendum)
You were seen in the emergency room today for hip pain.  We did an x-ray that was unrevealing.  We had started the process of placing Carol Snyder in a nursing home however it was going to take approximately 2 days.  He had decided instead to go home and pursue skilled nursing facility placement for your primary care doctor.  If something changes in the interim and you cannot take care of the patient please come back to the emergency department with her.

## 2022-07-07 NOTE — Care Management (Signed)
ED RNCM spoke with daughter Lajoyce Lauber [phone] concerning HH services.  Patient is active with [facility] services. Daughter stated, she will taking patient home due to patient being in a hall bed. She states she was made aware by the SW that patient will have to wait for several days and has opted to  work on Rehab from home.  Resumption for [facility] services sent to [facility] services, no further ED RNCM needs identified.

## 2022-07-07 NOTE — Evaluation (Signed)
Physical Therapy Evaluation Patient Details Name: Carol Snyder MRN: [id number] DOB: [birth year] Today's Date: 07/07/2022  History of Present Illness  Pt is a 88 y/o female presenting to the ED secondary to LLE pain and inability to ambulate. PMH includes CVA, a fib, pacemaker, and orthostatic hypotension.  Clinical Impression  Pt presenting secondary to problem above with deficits below. Pt with significant pain in LLE and pt reporting cramping in her LLE. Only able to tolerate rolling this session for repositioning. Pt was previously able to ambulate using RW prior to onset of pain. Recommending SNF level therapies at d/c to address current mobility deficits. Will continue to follow acutely.        Recommendations for follow up therapy are one component of a multi-disciplinary discharge planning process, led by the attending physician.  Recommendations may be updated based on patient status, additional functional criteria and insurance authorization.  Follow Up Recommendations Skilled nursing-short term rehab (<3 hours/day) Can patient physically be transported by private vehicle: No    Assistance Recommended at Discharge Frequent or constant Supervision/Assistance  Patient can return home with the following  Two people to help with walking and/or transfers;Two people to help with bathing/dressing/bathroom;Assistance with cooking/housework;Assist for transportation;Help with stairs or ramp for entrance    Equipment Recommendations None recommended by PT  Recommendations for Other Services       Functional Status Assessment Patient has had a recent decline in their functional status and demonstrates the ability to make significant improvements in function in a reasonable and predictable amount of time.     Precautions / Restrictions Precautions Precautions: Fall Restrictions Weight Bearing Restrictions: No      Mobility  Bed Mobility Overal bed mobility: Needs Assistance Bed  Mobility: Rolling Rolling: Max assist         General bed mobility comments: Max A to roll to R side to off weight L hip. Pt with significant pain and unable to tolerate further mobility.    Transfers                        Ambulation/Gait                  Stairs            Wheelchair Mobility    Modified Rankin (Stroke Patients Only)       Balance                                             Pertinent Vitals/Pain Pain Assessment Pain Assessment: Faces Faces Pain Scale: Hurts worst Pain Location: LLE Pain Descriptors / Indicators: Grimacing, Guarding Pain Intervention(s): Limited activity within patient's tolerance, Monitored during session, Repositioned    Home Living Family/patient expects to be discharged to:: Private residence Living Arrangements: Alone;Children Available Help at Discharge: Family;Available 24 hours/day Type of Home: House Home Access: Ramped entrance       Home Layout: One level Home Equipment: Agricultural consultant (2 wheels);Rollator (4 wheels);Cane - single point;Shower seat - built in;Wheelchair - manual;BSC/3in1      Prior Function Prior Level of Function : Needs assist             Mobility Comments: Was able to ambulate following previous admission, however, over the past couple of days has been unable to ambulate secondary to LLE pain  Hand Dominance        Extremity/Trunk Assessment   Upper Extremity Assessment Upper Extremity Assessment: Generalized weakness    Lower Extremity Assessment Lower Extremity Assessment: LLE deficits/detail LLE Deficits / Details: Severe LLE pain from hip all the way down to the knee       Communication   Communication: No difficulties  Cognition Arousal/Alertness: Awake/alert Behavior During Therapy: WFL for tasks assessed/performed Overall Cognitive Status: Within Functional Limits for tasks assessed                                           General Comments      Exercises     Assessment/Plan    PT Assessment Patient needs continued PT services  PT Problem List Decreased strength;Decreased activity tolerance;Decreased balance;Decreased mobility;Cardiopulmonary status limiting activity;Pain       PT Treatment Interventions DME instruction;Gait training;Functional mobility training;Therapeutic activities;Therapeutic exercise;Balance training;Patient/family education    PT Goals (Current goals can be found in the Care Plan section)  Acute Rehab PT Goals Patient Stated Goal: to decrease pain PT Goal Formulation: With patient/family Time For Goal Achievement: 07/21/22 Potential to Achieve Goals: Good    Frequency Min 2X/week     Co-evaluation               AM-PAC PT "6 Clicks" Mobility  Outcome Measure Help needed turning from your back to your side while in a flat bed without using bedrails?: A Lot Help needed moving from lying on your back to sitting on the side of a flat bed without using bedrails?: A Lot Help needed moving to and from a bed to a chair (including a wheelchair)?: Total Help needed standing up from a chair using your arms (e.g., wheelchair or bedside chair)?: Total Help needed to walk in hospital room?: Total Help needed climbing 3-5 steps with a railing? : Total 6 Click Score: 8    End of Session   Activity Tolerance: Patient limited by pain Patient left: in bed;with family/visitor present (on stretcher in hallway of ED) Nurse Communication: Mobility status PT Visit Diagnosis: Unsteadiness on feet (R26.81);Other abnormalities of gait and mobility (R26.89);Muscle weakness (generalized) (M62.81);Difficulty in walking, not elsewhere classified (R26.2);Pain Pain - Right/Left: Left Pain - part of body: Leg    Time: 1610-9604 PT Time Calculation (min) (ACUTE ONLY): 16 min   Charges:   PT Evaluation $PT Eval Moderate Complexity: 1 Mod          Farley Ly, PT,  DPT  Acute Rehabilitation Services  Office: [phone]   Lehman Prom 07/07/2022, 3:59 PM

## 2022-07-07 NOTE — ED Notes (Signed)
Patient verbalizes understanding of discharge instructions. Opportunity for questioning and answers were provided. Armband removed by staff, pt discharged from ED via wheelchair.  

## 2022-07-07 NOTE — ED Provider Notes (Signed)
[facility] EMERGENCY DEPARTMENT Provider Note   CSN: [id number] Arrival date & time: 07/07/22  1201     History  No chief complaint on file.   Carol Snyder is a 88 y.o. female PMH A fib, pacemaker, CHF who is presenting with left leg pain.  Patient's history changed several times during interview, and it is unclear exactly when this leg pain started.  Patient denies any trauma or recent falls.  Patient reports that the pain is located on the outside of her left hip, and is only present "when I move my leg".  She gets around at home with the use of several walking devices and reports that she has 24/7 help at home.  She denies any fever, chest pain, difficulty breathing.  Has not taken any medication prior to arrival.  Daughter arrived at bedside.  She reports that her mother was able to get around with a walker and typically complains of pain in the right hip.  However, beginning on Friday, she began complaining of pain in the left hip.  This pain has been persistent and has not improved since it started on Friday.  Patient requested to be seen in the emergency department for this issue.  Daughter denies any fevers, change in mental status, URI type symptoms.  Daughter reports that she and her husband take care of the patient at home.  However recently, when they are trying to move the patient, they notice that she often complains of pain.  Daughter reports that the weakness in her left leg that the patient is complaining about is not new, and that they are concerned she may need physical therapy or a rehab center.  Home Medications Prior to Admission medications   Medication Sig Start Date End Date Taking? Authorizing Provider  acetaminophen (TYLENOL) 500 MG tablet Take 1,000 mg by mouth in the morning and at bedtime.    [provider]  alendronate (FOSAMAX) 70 MG tablet Take 70 mg by mouth once a week. Sundays    [provider]  Camphor-Menthol-Methyl Sal  (SALONPAS) 3.11-22-08 % PTCH Apply 1 patch topically daily as needed (pain).    [provider]  carvedilol (COREG) 3.125 MG tablet Take 1 tablet (3.125 mg total) by mouth 2 (two) times daily with a meal. 06/16/22   Azucena Fallen, MD  cholestyramine Lanetta Inch) 4 g packet Take 2 g by mouth daily as needed (Diarrhea). Half pack daily    [provider]  digoxin (LANOXIN) 0.125 MG tablet Take 0.5 tablets (62.5 mcg total) by mouth daily. Patient taking differently: Take 62.5 mcg by mouth every other day. 03/17/22   Jake Bathe, MD  fluticasone (FLONASE) 50 MCG/ACT nasal spray Place 1 spray into both nostrils daily as needed for allergies. 08/22/20   [provider]  furosemide (LASIX) 80 MG tablet Take 1 tablet (80 mg total) by mouth daily. Patient taking differently: Take 40 mg by mouth daily. 02/06/21   Bhagat, Sharrell Ku, PA  hydroxypropyl methylcellulose / hypromellose (ISOPTO TEARS / GONIOVISC) 2.5 % ophthalmic solution Place 1 drop into both eyes 3 (three) times daily as needed for dry eyes.    [provider]  loperamide (IMODIUM) 2 MG capsule Take 4 mg by mouth daily.    [provider]  loratadine (CLARITIN) 10 MG tablet Take 10 mg by mouth daily.    [provider]  Menthol, Topical Analgesic, (BIOFREEZE EX) Apply 1 application topically daily as needed (pain).  [provider]  nitroGLYCERIN (NITROSTAT) 0.4 MG SL tablet Place 1 tablet (0.4 mg total) under the tongue every 5 (five) minutes as needed for chest pain. 09/12/19   Filbert Schilder, NP  pantoprazole (PROTONIX) 40 MG tablet Take 1 tablet (40 mg total) by mouth 2 (two) times daily. Patient taking differently: Take 40 mg by mouth daily. 08/13/21 08/13/22  Kathi Der, MD  potassium chloride SA (KLOR-CON M20) 20 MEQ tablet Take 1 tablet (20 mEq total) by mouth daily. 06/09/22   Jake Bathe, MD  sucralfate (CARAFATE) 1 GM/10ML suspension Take 10 mLs (1 g total) by  mouth 2 (two) times daily. Patient taking differently: Take 1 g by mouth daily. 08/13/21 08/13/22  Kathi Der, MD  warfarin (COUMADIN) 5 MG tablet Take 2.5-5 mg by mouth daily. Take 2.5 mg tablet by mouth Sun, Tues, Wed, Fri then take whole 5 mg tablet by moth rest of days per daughter    [provider]      Allergies    Codeine, Enbrel [etanercept], Hydromorphone, Motrin [ibuprofen], Sulfa antibiotics, Voltaren [diclofenac], and Naproxen sodium    Review of Systems   Review of Systems As in HPI  Physical Exam Updated Vital Signs BP 129/86 (BP Location: Right Arm)   Pulse (!) 111   Temp 98 F (36.7 C) (Oral)   Resp 20   SpO2 96%   Physical Exam Vitals and nursing note reviewed.  Constitutional:      General: She is not in acute distress.    Appearance: Normal appearance. She is well-developed. She is not ill-appearing or diaphoretic.  HENT:     Head: Normocephalic and atraumatic.     Right Ear: External ear normal.     Left Ear: External ear normal.     Nose: Nose normal.     Mouth/Throat:     Mouth: Mucous membranes are moist.  Cardiovascular:     Rate and Rhythm: Normal rate and regular rhythm.     Heart sounds: Murmur heard.  Pulmonary:     Effort: Pulmonary effort is normal. No respiratory distress.     Breath sounds: Normal breath sounds. No wheezing.  Abdominal:     General: There is no distension.     Palpations: Abdomen is soft.     Tenderness: There is no abdominal tenderness. There is no guarding.  Musculoskeletal:     Cervical back: Neck supple.     Right lower leg: No edema.     Left lower leg: No edema.     Comments: Tenderness to palpation over lateral left hip.  Decreased range of motion due to pain of left hip compared to right.  Skin:    General: Skin is warm and dry.  Neurological:     Mental Status: She is alert.     Comments: Full and equal strength of all 4 extremities.     ED Results / Procedures / Treatments   Labs (all  labs ordered are listed, but only abnormal results are displayed) Labs Reviewed - No data to display  EKG None  Radiology DG Pelvis 1-2 Views  Result Date: 07/07/2022 CLINICAL DATA:  left hip pain; left leg pain EXAM: PELVIS - 1 VIEW; LEFT FEMUR 2 VIEWS, 4 images COMPARISON:  Pelvis one view, dated June 14, 2022 FINDINGS: As before, again seen is the left hip prosthesis. Severe degenerative changes at the right hip joint with some deformity of the femoral head. Pelvic ring appears intact. No acute fracture. Moderate  lumbar spondylosis. Vascular calcifications. Two views of the left femur demonstrates left hip prosthesis and the prosthetic components are in near anatomical alignment. No periprosthetic lucency. No fracture. Osteoporosis. Vascular calcifications. Degenerative changes of the visualized knee joint. IMPRESSION: 1. Left hip prosthesis and the prosthetic components are in near anatomical alignment. No periprosthetic lucency. 2.  Severe degenerative changes of the right hip joint. 3.  No acute fracture. Electronically Signed   By: Marjo Bicker M.D.   On: 07/07/2022 13:05   DG Femur Min 2 Views Left  Result Date: 07/07/2022 CLINICAL DATA:  left hip pain; left leg pain EXAM: PELVIS - 1 VIEW; LEFT FEMUR 2 VIEWS, 4 images COMPARISON:  Pelvis one view, dated June 14, 2022 FINDINGS: As before, again seen is the left hip prosthesis. Severe degenerative changes at the right hip joint with some deformity of the femoral head. Pelvic ring appears intact. No acute fracture. Moderate lumbar spondylosis. Vascular calcifications. Two views of the left femur demonstrates left hip prosthesis and the prosthetic components are in near anatomical alignment. No periprosthetic lucency. No fracture. Osteoporosis. Vascular calcifications. Degenerative changes of the visualized knee joint. IMPRESSION: 1. Left hip prosthesis and the prosthetic components are in near anatomical alignment. No periprosthetic lucency. 2.   Severe degenerative changes of the right hip joint. 3.  No acute fracture. Electronically Signed   By: Marjo Bicker M.D.   On: 07/07/2022 13:05    Procedures Procedures   Medications Ordered in ED Medications - No data to display  ED Course/ Medical Decision Making/ A&P                           Medical Decision Making Amount and/or Complexity of Data Reviewed Labs: ordered. Radiology: ordered.  Risk OTC drugs. Prescription drug management.   Carol Snyder is a 88 y.o. female with A fib, pacemaker, CHF who is presenting with left leg pain.  Vitals at presentation notable for tachycardia.  Patient is hemodynamically stable, afebrile, satting well on room air.  Physical exam reassuring.  Patient has murmur on exam, and per review of echo, has regurgitation of multiple heart valves.  Lungs clear to auscultation bilaterally.  Abdomen is soft, nontender to palpation.  No pitting edema.  Tenderness to palpation over lateral left hip with decreased range of motion of the left hip due to pain compared to the right.  However, patient is able to lift her left leg off of the bed and wiggle her toes.  Initial differential includes but is not limited to: Arthritis, degenerative changes, traumatic injury, periprosthetic fracture, osteoporosis, septic arthritis  Lab work ordered and is pending at the time of signout.  Imaging was pursued, notable for no evidence of acute fracture of the left hip or femur.  EKG obtained, demonstrates A-fib, ventricular rate 111 bpm.  No evidence of abnormal intervals or acute ischemia.  Interpreted by myself and my attending.  Most likely cause of presentation today is arthritis versus chronic degenerative changes.  Patient is afebrile, overall well-appearing.  I have low suspicion for septic arthritis.  No evidence of trauma on exam or on x-rays.  Interventions include p.o. Tylenol, topical lidocaine patch  Following administration of p.o. Tylenol and lidocaine  patch, patient and daughter expressed reservation that the patient would not be able to ambulate.  Social work and physical therapy consults were placed, with recommendations pending.  Patient may ultimately benefit from acute rehab center.  Ultimate disposition of  this patient will depend on their recommendations.  The plan for this patient was discussed with Dr. Rodena Medin, who voiced agreement and who oversaw evaluation and treatment of this patient.   Final Clinical Impression(s) / ED Diagnoses Final diagnoses:  Left leg pain    Rx / DC Orders ED Discharge Orders     None         Skeet Simmer, MD 07/07/22 1526    Wynetta Fines, MD 07/08/22 [phone]

## 2022-07-07 NOTE — NC FL2 (Signed)
[place] MEDICAID FL2 LEVEL OF CARE SCREENING TOOL     IDENTIFICATION  Patient Name: Carol Snyder Birthdate: 08/07/1929 Sex: female Admission Date (Current Location): 07/07/2022  [facility] [place] Number:  Producer, television/film/video and Address:  [facility] [place], [address] [place], [place] [address]      Provider Number: [id number]  Attending Physician Name and Address:  Gwyneth Sprout, MD  Relative Name and Phone Number:  Lajoyce Lauber [phone]    Current Level of Care: Hospital Recommended Level of Care: Skilled Nursing Facility Prior Approval Number:    Date Approved/Denied:   PASRR Number: [id number]  Discharge Plan: SNF    Current Diagnoses: Patient Active Problem List   Diagnosis Date Noted   Syncope 06/16/2022   Scalp hematoma, initial encounter 06/14/2022   Chronic systolic CHF (congestive heart failure) (HCC) 06/14/2022   Syncope and collapse 06/14/2022   Sacral pain 06/14/2022   Acute on chronic systolic congestive heart failure (HCC) 07/05/2019   Hypokalemia 09/28/2018   Recurrent right pleural effusion 06/20/2018   Protein-calorie malnutrition, severe 06/07/2018   Dehydration    Laceration of right lower leg    Fall 06/06/2018   AKI (acute kidney injury) (HCC) 06/06/2018   Diarrhea 06/06/2018   Pacemaker 06/06/2018   Chronic anticoagulation 06/06/2018   Atrial fibrillation, chronic (HCC) 07/04/2011    Orientation RESPIRATION BLADDER Height & Weight     Self, Situation, Place, Time  Normal Continent Weight: 105 lb (47.6 kg) Height:  5\' 8"  (172.7 cm)  BEHAVIORAL SYMPTOMS/MOOD NEUROLOGICAL BOWEL NUTRITION STATUS      Continent Diet (Regular)  AMBULATORY STATUS COMMUNICATION OF NEEDS Skin   Limited Assist Verbally Normal                       Personal Care Assistance Level of Assistance  Bathing, Feeding, Dressing Bathing Assistance: Limited assistance Feeding assistance: Independent Dressing Assistance:  Limited assistance     Functional Limitations Info  Sight, Hearing, Speech Sight Info: Adequate Hearing Info: Adequate Speech Info: Adequate    SPECIAL CARE FACTORS FREQUENCY  PT (By licensed PT), OT (By licensed OT)     PT Frequency: 5 X a week OT Frequency: 5 X a week            Contractures Contractures Info: Not present    Additional Factors Info  Code Status, Allergies Code Status Info: Full Allergies Info: Codeine, Enbrel (etanercept), Hydromorphone, Motrin (ibuprofen) -Due to blood thinner, Sulfa Antibiotics-Childhood allergy, Voltaren (diclofenac), and Naproxen Sodium           Current Medications (07/07/2022):  This is the current hospital active medication list Current Facility-Administered Medications  Medication Dose Route Frequency Provider Last Rate Last Admin   acetaminophen (TYLENOL) tablet 1,000 mg  1,000 mg Oral Q6H PRN 07/09/2022, MD       [START ON 07/08/2022] carvedilol (COREG) tablet 3.125 mg  3.125 mg Oral BID WC Plunkett, Whitney, MD       digoxin (LANOXIN) tablet 62.5 mcg  62.5 mcg Oral QODAY Plunkett, Whitney, MD       furosemide (LASIX) tablet 40 mg  40 mg Oral Daily Plunkett, Whitney, MD       HYDROcodone-acetaminophen (NORCO/VICODIN) 5-325 MG per tablet 1 tablet  1 tablet Oral Once 07/10/2022, MD       lidocaine (LIDODERM) 5 % 1 patch  1 patch Transdermal Q24H Wynetta Fines, MD   1 patch at 07/07/22  1426   pantoprazole (PROTONIX) EC tablet 40 mg  40 mg Oral Daily Plunkett, Whitney, MD       potassium chloride SA (KLOR-CON M) CR tablet 20 mEq  20 mEq Oral Daily Plunkett, Whitney, MD       sucralfate (CARAFATE) 1 GM/10ML suspension 1 g  1 g Oral Daily Plunkett, Whitney, MD       warfarin (COUMADIN) tablet 2.5-5 mg  2.5-5 mg Oral Daily Gwyneth Sprout, MD       Current Outpatient Medications  Medication Sig Dispense Refill   acetaminophen (TYLENOL) 500 MG tablet Take 1,000 mg by mouth in the morning and at bedtime.      alendronate (FOSAMAX) 70 MG tablet Take 70 mg by mouth once a week. Sundays     Camphor-Menthol-Methyl Sal (SALONPAS) 3.11-22-08 % PTCH Apply 1 patch topically daily as needed (pain).     carvedilol (COREG) 3.125 MG tablet Take 1 tablet (3.125 mg total) by mouth 2 (two) times daily with a meal. 60 tablet 0   cholestyramine (QUESTRAN) 4 g packet Take 2 g by mouth daily.     digoxin (LANOXIN) 0.125 MG tablet Take 0.5 tablets (62.5 mcg total) by mouth daily. (Patient taking differently: Take 62.5 mcg by mouth every other day.) 7 tablet 0   fluticasone (FLONASE) 50 MCG/ACT nasal spray Place 1 spray into both nostrils daily as needed for allergies.     furosemide (LASIX) 80 MG tablet Take 1 tablet (80 mg total) by mouth daily. 90 tablet 3   hydroxypropyl methylcellulose / hypromellose (ISOPTO TEARS / GONIOVISC) 2.5 % ophthalmic solution Place 1 drop into both eyes 3 (three) times daily as needed for dry eyes.     loperamide (IMODIUM) 2 MG capsule Take 4 mg by mouth daily.     loratadine (CLARITIN) 10 MG tablet Take 10 mg by mouth daily.     nitroGLYCERIN (NITROSTAT) 0.4 MG SL tablet Place 1 tablet (0.4 mg total) under the tongue every 5 (five) minutes as needed for chest pain. 25 tablet 6   pantoprazole (PROTONIX) 40 MG tablet Take 1 tablet (40 mg total) by mouth 2 (two) times daily. (Patient taking differently: Take 40 mg by mouth daily.) 180 tablet 0   potassium chloride SA (KLOR-CON M20) 20 MEQ tablet Take 1 tablet (20 mEq total) by mouth daily. 15 tablet 0   sucralfate (CARAFATE) 1 GM/10ML suspension Take 10 mLs (1 g total) by mouth 2 (two) times daily. (Patient taking differently: Take 1 g by mouth daily.) 600 mL 0   warfarin (COUMADIN) 5 MG tablet Take 2.5-5 mg by mouth daily.     Menthol, Topical Analgesic, (BIOFREEZE EX) Apply 1 application topically daily as needed (pain). (Patient not taking: Reported on 07/07/2022)       Discharge Medications: Please see discharge summary for a list of  discharge medications.  Relevant Imaging Results:  Relevant Lab Results:   Additional Information SSN#:  [id number]       Ht:  5'8"           Wt:  105 lbs            BMI:  15.97 kg/m2  Axie Hayne C Tarpley-Carter, LCSWA

## 2022-07-07 NOTE — ED Notes (Signed)
Pt daughter to take pt home - Pt daughter was wondering about prescription for Robaxin - MD asked at this time, awaiting orders

## 2022-07-07 NOTE — ED Triage Notes (Signed)
Pt BIB [facility] from home C/O L hip pain radiating down L leg. Pt reports pain is sharp in nature and is worse when she ambulates.

## 2022-07-07 NOTE — Progress Notes (Signed)
ANTICOAGULATION CONSULT NOTE - Initial Consult  Pharmacy Consult for warfarin Indication: atrial fibrillation  Allergies  Allergen Reactions   Codeine Other (See Comments)    unknown   Enbrel [Etanercept] Other (See Comments)    unknown   Hydromorphone Other (See Comments)    unknown   Motrin [Ibuprofen] Other (See Comments)    Due to blood thinner    Sulfa Antibiotics Other (See Comments)    Childhood allergy    Voltaren [Diclofenac] Other (See Comments)    unknown   Naproxen Sodium Palpitations    Patient Measurements: Height: 5\' 8"  (172.7 cm) Weight: 47.6 kg (105 lb) IBW/kg (Calculated) : 63.9 Heparin Dosing Weight: 47.6kg  Vital Signs: Temp: 98 F (36.7 C) (08/21 1201) Temp Source: Oral (08/21 1201) BP: 124/77 (08/21 1824) Pulse Rate: 122 (08/21 1824)  Labs: Recent Labs    07/07/22 1530  HGB 8.7*  HCT 27.4*  PLT 235  CREATININE 1.10*    Estimated Creatinine Clearance: 25 mL/min (A) (by C-G formula based on SCr of 1.1 mg/dL (H)).   Medical History: Past Medical History:  Diagnosis Date   Anemia    Aortic insufficiency    Chest pain    Chronic diarrhea    DNR (do not resuscitate)    Dysrhythmia    afib   Falling    History of GI bleed    History of shingles    Long term current use of anticoagulant    Mitral regurgitation    Orthostatic hypotension    Persistent atrial fibrillation (HCC)    on Coumadin   Pleural effusion    Sinus node dysfunction (HCC)    a. s/p Biotronik PPM.   Stroke (HCC)    Tricuspid regurgitation     Medications:  Scheduled:   [START ON 07/08/2022] carvedilol  3.125 mg Oral BID WC   digoxin  62.5 mcg Oral QODAY   furosemide  40 mg Oral Daily   HYDROcodone-acetaminophen  1 tablet Oral Once   lidocaine  1 patch Transdermal Q24H   pantoprazole  40 mg Oral Daily   potassium chloride SA  20 mEq Oral Daily   sucralfate  1 g Oral Daily   warfarin  2.5-5 mg Oral Daily    Assessment: 88 yo F on warfarin PTA for afib.   Warfarin home regimen: 2.5mg  Sun, Tues, Wed, Fri and 5mg  Mon, Thurs, Sat (to be verified by daughter who is checking paper medication list at her home)   INR on admit (8/21): 3.8   Goal of Therapy:  INR 2-3 Monitor platelets by anticoagulation protocol: Yes   Plan:  Omit warfarin dose 07/07/22  Recheck INR 8/22  Monitor s/sx bleeding     07/09/22 Clinical Pharmacist 07/07/2022,7:38 PM

## 2022-07-07 NOTE — ED Triage Notes (Signed)
L sided pain started yesterday. Pt stated she had weakness in her leg. Impaired gait. Pt denies falls, fever, chest pain, dyspnea, & N/V.  Pt states the pain radiating in the hip.

## 2022-07-08 ENCOUNTER — Telehealth: Payer: Self-pay | Admitting: Internal Medicine

## 2022-07-08 NOTE — Telephone Encounter (Signed)
New message   Pt had appt scheduled but had to cancel appt d/t not being able to walk from some hip pain. Her daughter wants to know if she can have a virtual appt because it is difficult to transport the pt. She said that the pt has gone to the hospital [facility]) twice and has had her device checked and wants to know if this could be used for a virtual visit.

## 2022-07-14 ENCOUNTER — Other Ambulatory Visit: Payer: Self-pay

## 2022-07-14 MED ORDER — CARVEDILOL 3.125 MG PO TABS: 3.1250 mg | ORAL_TABLET | Freq: Two times a day (BID) | ORAL | 0 refills | Status: DC

## 2022-07-14 MED ORDER — DIGOXIN 125 MCG PO TABS: 62.5000 ug | ORAL_TABLET | Freq: Every day | ORAL | 0 refills | Status: DC

## 2022-07-14 NOTE — Telephone Encounter (Signed)
Pt's medications were sent to pt's pharmacy as requested. Confirmation received.  

## 2022-07-17 ENCOUNTER — Ambulatory Visit (INDEPENDENT_AMBULATORY_CARE_PROVIDER_SITE_OTHER): Payer: Medicare PPO

## 2022-07-17 DIAGNOSIS — I255 Ischemic cardiomyopathy: Secondary | ICD-10-CM

## 2022-07-17 LAB — CUP PACEART REMOTE DEVICE CHECK
Date Time Interrogation Session: 20230831061533
Implantable Lead Implant Date: 20170719
Implantable Lead Implant Date: 20170719
Implantable Lead Location: 753859
Implantable Lead Location: 753860
Implantable Lead Model: 377
Implantable Lead Model: 377
Implantable Lead Serial Number: 49475245
Implantable Lead Serial Number: 49500032
Implantable Pulse Generator Implant Date: 20170719
Pulse Gen Model: 407145
Pulse Gen Serial Number: 68814684

## 2022-07-20 NOTE — Progress Notes (Unsigned)
Cardiology Office Note Date:  07/23/2022  Patient ID:  Carol Snyder, DOB 07/20/1929, MRN [id number] PCP:  Kaleen MaskElkins, Wilson Oliver, MD  Cardiologist:  Dr. Anne FuSkains Electrophysiologist: Allred    Chief Complaint:  over due device visit, post hospital   History of Present Illness: Carol Snyder is a [age] y.o. female with history of permanent AFib,  SSSx w/PPM, stroke, chronic CHF (systolic)  He comes in today to be seen for Dr. Johney FrameAllred, last seen by him 2019, discussed on warfarin, declined OAC. No changes were made, planned for annual EP APP visits.  Note during a hospital stay her CLS pacing was programmed off 2/2 pacing in the 120's >> VVI 50  She has been followed by Dr. Anne FuSkains and his team since then.  Most recently by Demetra ShinerV. Bhagat, PA 02/06/21, doing well, no changes were made.  I saw her 02/21/21 She is accompanied by her daughter She reports no concerns, denies any CP, palpitations or cardiac awareness No dizzy spells, near syncope or syncope. She has poor appetite and losing weight Daughter mentions other GI issues, diarrhea. Following with IM Follows warfarin with her PMD Labs done there regularly No bleeding or signs of bleeding Discussed getting dig level, they preferred her labs be done via PMD and planned for it to be done there [facility]  [facility] visit 04/11/22: [facility] effusion that was drained, went to the ER with postural dizziness, via EMS, with them apparently had rapid Afib with position changes and syncope.  She was gove IVF, labs  Device interrogated without arrhythmias noted, suspect over diuresed and her lasix reduced  Admitted 06/13/22 - 06/17/22 with syncope and profound orthostatic hypotension, also noted to have hypoxia with ambulation, home meds held initially , in AFib w/RVR that improved,  CTA to evaluate given echocardiogram findings for pulmonary hypertension versus obstruction and elevated D-dimer however CTA was  negative for filling defects, tiny pleural effusion noted. Eventually home meds added back on, looks like her metoprolol changed to coreg and was able to ambulate and be discharged  TODAY She is accompanied by her daughter/son in law. She is in a wheelchair today They report that it has been a slow recovery since home. She has been generally pretty weak,  The pt states that the day she fainted she was in the kitchen and just fell backwards out of nowhere, did not have warning.  She is on lasix 80mg , this is a decrease from 120 apparently Her daughter states that when she is on any lower dose she gets so swollen that her legs will weep.  No bleeding or signs of bleeding  Her O2 sat on Ra here us ok, they have not been using O2, they have seen her PMD since discharge,  Will defer to PMD her O2 management, they report that he did not think she needed it  Device information Biotronik dual chamber PPM, implanted 06/04/2016 by Dr. Rudolpho SevinAkbary   Past Medical History:  Diagnosis Date   Anemia    Aortic insufficiency    Chest pain    Chronic diarrhea    DNR (do not resuscitate)    Dysrhythmia    afib   Falling    History of GI bleed    History of shingles    Long term current use of anticoagulant    Mitral regurgitation    Orthostatic hypotension    Persistent atrial fibrillation (HCC)  on Coumadin   Pleural effusion    Sinus node dysfunction (HCC)    a. s/p Biotronik PPM.   Stroke [facility])    Tricuspid regurgitation     Past Surgical History:  Procedure Laterality Date   APPENDECTOMY     BIOPSY  08/13/2021   Procedure: BIOPSY;  Surgeon: Kathi Der, MD;  Location: [facility];  Service: Gastroenterology;;   CHOLECYSTECTOMY     with partial colectomy do to gallbadder infection    ESOPHAGOGASTRODUODENOSCOPY (EGD) WITH PROPOFOL N/A 08/13/2021   Procedure: ESOPHAGOGASTRODUODENOSCOPY (EGD) WITH PROPOFOL;  Surgeon: Kathi Der, MD;  Location: [facility];  Service:  Gastroenterology;  Laterality: N/A;   IR THORACENTESIS ASP PLEURAL SPACE W/IMG GUIDE  06/22/2018   IR THORACENTESIS ASP PLEURAL SPACE W/IMG GUIDE  09/29/2018   IR THORACENTESIS ASP PLEURAL SPACE W/IMG GUIDE  07/05/2019   PACEMAKER IMPLANT  06/04/2016   BTK [place] 8 DR-T implanted by Dr Rudolpho Sevin at [facility] for sick sinus syndrome    Current Outpatient Medications  Medication Sig Dispense Refill   acetaminophen (TYLENOL) 500 MG tablet Take 1,000 mg by mouth in the morning and at bedtime.     alendronate (FOSAMAX) 70 MG tablet Take 70 mg by mouth once a week. Sundays     Camphor-Menthol-Methyl Sal (SALONPAS) 3.11-22-08 % PTCH Apply 1 patch topically daily as needed (pain).     carvedilol (COREG) 3.125 MG tablet Take 1 tablet (3.125 mg total) by mouth 2 (two) times daily with a meal. 60 tablet 0   cholestyramine (QUESTRAN) 4 g packet Take 2 g by mouth daily.     digoxin (LANOXIN) 0.125 MG tablet Take 0.5 tablets (62.5 mcg total) by mouth daily. (Patient taking differently: Take 62.5 mcg by mouth every other day.) 15 tablet 0   fluticasone (FLONASE) 50 MCG/ACT nasal spray Place 1 spray into both nostrils daily as needed for allergies.     furosemide (LASIX) 80 MG tablet Take 1 tablet (80 mg total) by mouth daily. 90 tablet 3   hydroxypropyl methylcellulose / hypromellose (ISOPTO TEARS / GONIOVISC) 2.5 % ophthalmic solution Place 1 drop into both eyes 3 (three) times daily as needed for dry eyes.     loperamide (IMODIUM) 2 MG capsule Take 4 mg by mouth daily.     loratadine (CLARITIN) 10 MG tablet Take 10 mg by mouth daily.     Menthol, Topical Analgesic, (BIOFREEZE EX) Apply 1 application  topically daily as needed (pain).     nitroGLYCERIN (NITROSTAT) 0.4 MG SL tablet Place 1 tablet (0.4 mg total) under the tongue every 5 (five) minutes as needed for chest pain. 25 tablet 6   pantoprazole (PROTONIX) 40 MG tablet Take 1 tablet (40 mg total) by mouth 2 (two) times daily. (Patient taking  differently: Take 40 mg by mouth daily.) 180 tablet 0   potassium chloride SA (KLOR-CON M20) 20 MEQ tablet Take 1 tablet (20 mEq total) by mouth daily. 15 tablet 0   sucralfate (CARAFATE) 1 GM/10ML suspension Take 10 mLs (1 g total) by mouth 2 (two) times daily. (Patient taking differently: Take 1 g by mouth daily.) 600 mL 0   warfarin (COUMADIN) 5 MG tablet Take 2.5-5 mg by mouth daily.     No current facility-administered medications for this visit.    Allergies:   Codeine, Enbrel [etanercept], Hydromorphone, Motrin [ibuprofen], Naproxen sodium, Sulfa antibiotics, and Voltaren [diclofenac]   Social History:  The patient  reports that she quit smoking about 17 years ago. Her  smoking use included cigarettes. She has never used smokeless tobacco. She reports that she does not drink alcohol and does not use drugs.   Family History:  The patient's family history includes Arrhythmia in her daughter; Cancer in her brother; Heart attack in her father; Hypertension in her father; Stroke in her mother.  ROS:  Please see the history of present illness.    All other systems are reviewed and otherwise negative.   PHYSICAL EXAM:  VS:  BP 104/64   Pulse (!) 123   Ht 5\' 5"  (1.651 m)   Wt 98 lb (44.5 kg)   SpO2 96%   BMI 16.31 kg/m  BMI: Body mass index is 16.31 kg/m. Well nourished, well developed, in no acute distress HEENT: normocephalic, atraumatic Neck: no JVD, carotid bruits or masses Cardiac:  RRR; no significant murmurs, no rubs, or gallops Lungs:  CTA b/l, no wheezing, rhonchi or rales Abd: soft, nontender MS: no deformity, advaned atrophy Ext: no edema Skin: warm and dry, no rash Neuro:  No gross deficits appreciated Psych: euthymic mood, full affect  PPM site is stable, no tethering or discomfort, skin is intact, no thinning   EKG:  Not done today  Device interrogation done today and reviewed by myself:  Battery and lead measurements are good VP only 4% HR histograms look  pretty good, the vast majority of rates 60-100's, some 120-130's (this includes the last 517 days as well...   06/14/2022: TTE  1. Left ventricular ejection fraction, by estimation, is 55 to 60%. The  left ventricle has normal function. The left ventricle has no regional  wall motion abnormalities. There is mild left ventricular hypertrophy.  Left ventricular diastolic parameters  are indeterminate. There is the interventricular septum is flattened in  diastole ('D' shaped left ventricle), consistent with right ventricular  volume overload.   2. Right ventricular systolic function is moderately reduced. The right  ventricular size is moderately enlarged. There is moderately elevated  pulmonary artery systolic pressure.   3. Left atrial size was severely dilated.   4. Right atrial size was severely dilated.   5. Mild to moderate mitral valve regurgitation.   6. Tricuspid valve regurgitation is mild to moderate.   7. Likely did not capture highest mean gradient. The aortic valve is  calcified. Aortic valve regurgitation is mild. Mild to moderate aortic  valve stenosis.   8. The inferior vena cava is dilated in size with <50% respiratory  variability, suggesting right atrial pressure of 15 mmHg.   Conclusion(s)/Recommendation(s): Compared to prior echo, there is more  pronounced septal flattening would r/o pulmonary embolism. Otherwise EF  has improved.    stress 06/2019 There was no ST segment deviation noted during stress. T wave inversion was noted during stress in the II, III and aVF leads. Defect 1: There is a large defect of moderate severity present in the basal inferoseptal, basal inferior, basal inferolateral, mid inferoseptal, mid inferior and mid inferolateral location. Findings consistent with subendocardial MI with peri-infarct ischemia of the inferior . inferioseptal and inferolateral walls. Nuclear stress EF: 33%. The left ventricular ejection fraction is moderately  decreased (30-44%). This is a high risk study.     Echo 06/2019  1. The left ventricle has severely reduced systolic function, with an  ejection fraction of 25-30%. The cavity size was normal. Left ventricular  diastolic function could not be evaluated secondary to atrial  fibrillation. Left ventricular diffuse  hypokinesis.   2. Severe akinesis of the left ventricular,  entire inferior wall.   3. The right ventricle has normal systolic function. The cavity was  moderately enlarged. There is no increase in right ventricular wall  thickness.   4. Left atrial size was severely dilated.   5. Right atrial size was severely dilated.   6. Large pleural effusion in the right lateral region.   7. The mitral valve is abnormal. Moderate thickening of the mitral valve  leaflet. Mitral valve regurgitation is moderate to severe by color flow  Doppler.   8. The tricuspid valve is grossly normal. Tricuspid valve regurgitation  is mild-moderate.   9. The aortic valve is tricuspid. Mild calcification of the aortic valve.  Aortic valve regurgitation is mild by color flow Doppler. Mild-moderate  stenosis of the aortic valve.  10. The aorta is normal unless otherwise noted.  11. The inferior vena cava was dilated in size with <50% respiratory  variability.     Recent Labs: 07/07/2022: ALT 5; BUN 21; Creatinine, Ser 1.10; Hemoglobin 8.7; Platelets 235; Potassium 4.9; Sodium 139  No results found for requested labs within last 365 days.   Estimated Creatinine Clearance: 23.4 mL/min (A) (by C-G formula based on SCr of 1.1 mg/dL (H)).   Wt Readings from Last 3 Encounters:  07/23/22 98 lb (44.5 kg)  07/07/22 105 lb (47.6 kg)  06/14/22 105 lb 4.8 oz (47.8 kg)     Other studies reviewed: Additional studies/records reviewed today include: summarized above  ASSESSMENT AND PLAN:  1. PPM     Intact function     no programming changes made  She has had issues with CLA programming historically and  will avoid this  2. Permanent AFib     CHA2DS2Vasc is 6, on warfarin,  monitored and managed by her PMD     She prefers staying with warfarin, has been offered the switch previously  Fair rate control   3. chronic CHF (systolic)     No signs of volume OL currently     LVEF with recovery by her echo in July but had a hospital stay in may requiring thora     Daughter reports lower dose of lasix has been tried with resultant edema     On BB, dig, furosemide     Dig level in the hospital was OK, on low dose      4. Orthostatic syncope     Discussed difficulty balancing volume management and tendency towards dehydration     Discussed meds for HR control that wont bother her BP      Discussed changing her coreg to toprol for HR, less BP lowering but had just been changed to coreg at the hospital and all the changes is getting a little frustrating for them She in the last 3 days has been better, up and ambulating well with PT, without symptoms.  For now, no changes Advised adequate hydration, monitoring for edema Advised standing slowly, avoiding long periods of standing Advised thigh sleeves Instructed not to take coreg if SBP is <100    Disposition: would like her to get back on track with gen cards and see Dr. Minerva Ends in a month, next  in clinic Pacer check in a year, sooner if needed, will transition to Dr. Elberta Fortis   Current medicines are reviewed at length with the patient today.  The patient did not have any concerns regarding medicines.  Norma Fredrickson, PA-C 07/23/2022 4:37 PM     [facility] [address] [employer]  [place] [address] [phone] (office)  [phone] (fax)

## 2022-07-23 ENCOUNTER — Ambulatory Visit: Payer: Medicare PPO | Attending: Physician Assistant | Admitting: Physician Assistant

## 2022-07-23 VITALS — BP 104/64 | HR 123 | Ht 65.0 in | Wt 98.0 lb

## 2022-07-23 DIAGNOSIS — Z950 Presence of cardiac pacemaker: Secondary | ICD-10-CM | POA: Diagnosis not present

## 2022-07-23 DIAGNOSIS — I4821 Permanent atrial fibrillation: Secondary | ICD-10-CM

## 2022-07-23 DIAGNOSIS — I5022 Chronic systolic (congestive) heart failure: Secondary | ICD-10-CM

## 2022-07-23 DIAGNOSIS — I951 Orthostatic hypotension: Secondary | ICD-10-CM

## 2022-07-23 LAB — CUP PACEART INCLINIC DEVICE CHECK
Date Time Interrogation Session: 20230906175151
Implantable Lead Implant Date: 20170719
Implantable Lead Implant Date: 20170719
Implantable Lead Location: 753859
Implantable Lead Location: 753860
Implantable Lead Model: 377
Implantable Lead Model: 377
Implantable Lead Serial Number: 49475245
Implantable Lead Serial Number: 49500032
Implantable Pulse Generator Implant Date: 20170719
Lead Channel Pacing Threshold Amplitude: 0.7 V
Lead Channel Pacing Threshold Pulse Width: 0.4 ms
Lead Channel Sensing Intrinsic Amplitude: 1.2 mV
Lead Channel Sensing Intrinsic Amplitude: 10.7 mV
Pulse Gen Model: 407145
Pulse Gen Serial Number: 68814684

## 2022-07-23 NOTE — Patient Instructions (Addendum)
Medication Instructions:   Your physician recommends that you continue on your current medications as directed. Please refer to the Current Medication list given to you today.  *If you need a refill on your cardiac medications before your next appointment, please call your pharmacy*   Lab Work: NONE ORDERED  TODAY   If you have labs (blood work) drawn today and your tests are completely normal, you will receive your results only by: MyChart Message (if you have MyChart) OR A paper copy in the mail If you have any lab test that is abnormal or we need to change your treatment, we will call you to review the results.   Testing/Procedures: NONE ORDERED  TODAY   Follow-Up: At [facility], you and your health needs are our priority.  As part of our continuing mission to provide you with exceptional heart care, we have created designated Provider Care Teams.  These Care Teams include your primary Cardiologist (physician) and Advanced Practice Providers (APPs -  Physician Assistants and Nurse Practitioners) who all work together to provide you with the care you need, when you need it.  We recommend signing up for the patient portal called "MyChart".  Sign up information is provided on this After Visit Summary.  MyChart is used to connect with patients for Virtual Visits (Telemedicine).  Patients are able to view lab/test results, encounter notes, upcoming appointments, etc.  Non-urgent messages can be sent to your provider as well.   To learn more about what you can do with MyChart, go to [email or web address].    Your next appointment:  DR Anne Fu /APP IN ONE - TWO MONTHS 1 year(s)  The format for your next appointment:   In Person  Provider:   You may see Dr. Elberta Fortis  or one of the following Advanced Practice Providers on your designated Care Team:   Francis Dowse, [place] Casimiro Needle "Mardelle Matte" Lanna Poche, [place]   Other Instructions   Important Information About Sugar

## 2022-07-24 ENCOUNTER — Other Ambulatory Visit: Payer: Self-pay | Admitting: Cardiology

## 2022-08-05 ENCOUNTER — Other Ambulatory Visit: Payer: Self-pay | Admitting: Cardiology

## 2022-08-05 NOTE — Telephone Encounter (Signed)
Rx refill sent to pharmacy. 

## 2022-08-07 NOTE — Progress Notes (Signed)
Remote pacemaker transmission.   

## 2022-08-25 ENCOUNTER — Telehealth: Payer: Self-pay | Admitting: Cardiology

## 2022-08-25 NOTE — Telephone Encounter (Signed)
Daughter stated that due to the patient's age she would like to get a follow-up tele visit.

## 2022-08-28 ENCOUNTER — Ambulatory Visit: Payer: Medicare PPO | Admitting: Nurse Practitioner

## 2022-08-28 NOTE — Telephone Encounter (Signed)
Pt has been scheduled for video visit     Patient Consent for Virtual Visit        Carol Snyder has provided verbal consent on 08/28/2022 for a virtual visit (video or telephone).   CONSENT FOR VIRTUAL VISIT FOR:  Carol Snyder Carol Snyder  By participating in this virtual visit I agree to the following:  I hereby voluntarily request, consent and authorize [facility] and its employed or contracted physicians, physician assistants, nurse practitioners or other licensed health care professionals (the Practitioner), to provide me with telemedicine health care services (the "Services") as deemed necessary by the treating Practitioner. I acknowledge and consent to receive the Services by the Practitioner via telemedicine. I understand that the telemedicine visit will involve communicating with the Practitioner through live audiovisual communication technology and the disclosure of certain medical information by electronic transmission. I acknowledge that I have been given the opportunity to request an in-person assessment or other available alternative prior to the telemedicine visit and am voluntarily participating in the telemedicine visit.  I understand that I have the right to withhold or withdraw my consent to the use of telemedicine in the course of my care at any time, without affecting my right to future care or treatment, and that the Practitioner or I may terminate the telemedicine visit at any time. I understand that I have the right to inspect all information obtained and/or recorded in the course of the telemedicine visit and may receive copies of available information for a reasonable fee.  I understand that some of the potential risks of receiving the Services via telemedicine include:  Delay or interruption in medical evaluation due to technological equipment failure or disruption; Information transmitted may not be sufficient (e.g. poor resolution of images) to allow for appropriate medical  decision making by the Practitioner; and/or  In rare instances, security protocols could fail, causing a breach of personal health information.  Furthermore, I acknowledge that it is my responsibility to provide information about my medical history, conditions and care that is complete and accurate to the best of my ability. I acknowledge that Practitioner's advice, recommendations, and/or decision may be based on factors not within their control, such as incomplete or inaccurate data provided by me or distortions of diagnostic images or specimens that may result from electronic transmissions. I understand that the practice of medicine is not an exact science and that Practitioner makes no warranties or guarantees regarding treatment outcomes. I acknowledge that a copy of this consent can be made available to me via my patient portal ([place]), or I can request a printed copy by calling the office of [facility].    I understand that my insurance will be billed for this visit.   I have read or had this consent read to me. I understand the contents of this consent, which adequately explains the benefits and risks of the Services being provided via telemedicine.  I have been provided ample opportunity to ask questions regarding this consent and the Services and have had my questions answered to my satisfaction. I give my informed consent for the services to be provided through the use of telemedicine in my medical care

## 2022-09-03 ENCOUNTER — Other Ambulatory Visit: Payer: Self-pay | Admitting: Cardiology

## 2022-09-22 ENCOUNTER — Ambulatory Visit: Payer: Medicare PPO | Attending: Cardiology | Admitting: Cardiology

## 2022-09-22 ENCOUNTER — Encounter: Payer: Self-pay | Admitting: Cardiology

## 2022-09-22 VITALS — BP 104/66 | HR 84 | Ht 65.0 in

## 2022-09-22 DIAGNOSIS — I5022 Chronic systolic (congestive) heart failure: Secondary | ICD-10-CM

## 2022-09-22 DIAGNOSIS — Z950 Presence of cardiac pacemaker: Secondary | ICD-10-CM

## 2022-09-22 DIAGNOSIS — I4821 Permanent atrial fibrillation: Secondary | ICD-10-CM

## 2022-09-22 MED ORDER — CARVEDILOL 3.125 MG PO TABS: ORAL_TABLET | ORAL | 3 refills | Status: DC

## 2022-09-22 NOTE — Patient Instructions (Signed)
Medication Instructions:  The current medical regimen is effective;  continue present plan and medications.  *If you need a refill on your cardiac medications before your next appointment, please call your pharmacy*  Follow-Up: At [facility], you and your health needs are our priority.  As part of our continuing mission to provide you with exceptional heart care, we have created designated Provider Care Teams.  These Care Teams include your primary Cardiologist (physician) and Advanced Practice Providers (APPs -  Physician Assistants and Nurse Practitioners) who all work together to provide you with the care you need, when you need it.  We recommend signing up for the patient portal called "MyChart".  Sign up information is provided on this After Visit Summary.  MyChart is used to connect with patients for Virtual Visits (Telemedicine).  Patients are able to view lab/test results, encounter notes, upcoming appointments, etc.  Non-urgent messages can be sent to your provider as well.   To learn more about what you can do with MyChart, go to [email or web address].    Your next appointment:   1 year(s)  The format for your next appointment:   In Person  Provider:   Mark Skains, MD      Important Information About Sugar       

## 2022-09-22 NOTE — Progress Notes (Signed)
Virtual Visit via Video Note   Because of Carol Snyder co-morbid illnesses, she is at least at moderate risk for complications without adequate follow up.  This format is felt to be most appropriate for this patient at this time.  All issues noted in this document were discussed and addressed.  A limited physical exam was performed with this format.  Please refer to the patient's chart for her consent to telehealth for [facility].       Date:  09/22/2022   ID:  Carol Snyder, DOB Jan 10, 1929, MRN [id number] The patient was identified using 2 identifiers.  Patient Location: Home Provider Location: Office/Clinic   PCP:  Kaleen Mask, MD   [facility] Providers Cardiologist:  Donato Schultz, MD     Evaluation Performed:  Follow-Up Visit  Chief Complaint: Heart failure follow-up  History of Present Illness:    Carol Snyder is a 88 y.o. female with chronic systolic heart failure with recovery of her ejection fraction, orthostatic hypotension and permanent atrial fibrillation with pacemaker placement here for video visit follow-up.  She is doing well.  She feels great.  She is mobile.  Her daughter Carol Snyder is present for help with discussion.  Dr. Jeannetta Nap follows her Coumadin.  She denies any chest pain fevers chills nausea vomiting syncope bleeding.     Past Medical History:  Diagnosis Date   Anemia    Aortic insufficiency    Chest pain    Chronic diarrhea    DNR (do not resuscitate)    Dysrhythmia    afib   Falling    History of GI bleed    History of shingles    Long term current use of anticoagulant    Mitral regurgitation    Orthostatic hypotension    Persistent atrial fibrillation (HCC)    on Coumadin   Pleural effusion    Sinus node dysfunction (HCC)    a. s/p Biotronik PPM.   Stroke [facility])    Tricuspid regurgitation    Past Surgical History:  Procedure Laterality Date   APPENDECTOMY     BIOPSY  08/13/2021   Procedure: BIOPSY;   Surgeon: Kathi Der, MD;  Location: [facility];  Service: Gastroenterology;;   CHOLECYSTECTOMY     with partial colectomy do to gallbadder infection    ESOPHAGOGASTRODUODENOSCOPY (EGD) WITH PROPOFOL N/A 08/13/2021   Procedure: ESOPHAGOGASTRODUODENOSCOPY (EGD) WITH PROPOFOL;  Surgeon: Kathi Der, MD;  Location: [facility];  Service: Gastroenterology;  Laterality: N/A;   IR THORACENTESIS ASP PLEURAL SPACE W/IMG GUIDE  06/22/2018   IR THORACENTESIS ASP PLEURAL SPACE W/IMG GUIDE  09/29/2018   IR THORACENTESIS ASP PLEURAL SPACE W/IMG GUIDE  07/05/2019   PACEMAKER IMPLANT  06/04/2016   BTK Edora 8 DR-T implanted by Dr Rudolpho Sevin at [facility] for sick sinus syndrome     Current Meds  Medication Sig   acetaminophen (TYLENOL) 500 MG tablet Take 1,000 mg by mouth in the morning and at bedtime.   alendronate (FOSAMAX) 70 MG tablet Take 70 mg by mouth once a week. Sundays   Camphor-Menthol-Methyl Sal (SALONPAS) 3.11-22-08 % PTCH Apply 1 patch topically daily as needed (pain).   cholestyramine (QUESTRAN) 4 g packet Take 2 g by mouth daily.   digoxin (LANOXIN) 0.125 MG tablet TAKE 1/2 TABLET BY MOUTH DAILY (Patient taking differently: Take 62.5 mcg by mouth every other day.)   fluticasone (FLONASE) 50 MCG/ACT nasal spray Place 1 spray into both nostrils daily as needed for  allergies.   furosemide (LASIX) 80 MG tablet Take 1 tablet (80 mg total) by mouth daily. (Patient taking differently: Take 80 mg by mouth every other day. 40mg  every other day)   hydroxypropyl methylcellulose / hypromellose (ISOPTO TEARS / GONIOVISC) 2.5 % ophthalmic solution Place 1 drop into both eyes 3 (three) times daily as needed for dry eyes.   loperamide (IMODIUM) 2 MG capsule Take 4 mg by mouth daily.   loratadine (CLARITIN) 10 MG tablet Take 10 mg by mouth daily.   Menthol, Topical Analgesic, (BIOFREEZE EX) Apply 1 application  topically daily as needed (pain).   nitroGLYCERIN (NITROSTAT) 0.4 MG SL tablet  Place 1 tablet (0.4 mg total) under the tongue every 5 (five) minutes as needed for chest pain.   pantoprazole (PROTONIX) 40 MG tablet Take 1 tablet (40 mg total) by mouth 2 (two) times daily.   potassium chloride SA (KLOR-CON M20) 20 MEQ tablet TAKE 1 TABLET BY MOUTH EVERY DAY   sucralfate (CARAFATE) 1 GM/10ML suspension Take 10 mLs (1 g total) by mouth 2 (two) times daily.   warfarin (COUMADIN) 5 MG tablet Take 2.5-5 mg by mouth daily.   [DISCONTINUED] carvedilol (COREG) 3.125 MG tablet TAKE 1 TABLET BY MOUTH TWICE A DAY WITH A MEAL     Allergies:   Codeine, Enbrel [etanercept], Hydromorphone, Motrin [ibuprofen], Naproxen sodium, Sulfa antibiotics, and Voltaren [diclofenac]   Social History   Tobacco Use   Smoking status: Former    Types: Cigarettes    Quit date: 11/17/2004    Years since quitting: 17.8   Smokeless tobacco: Never  Vaping Use   Vaping Use: Never used  Substance Use Topics   Alcohol use: No   Drug use: No     Family Hx: The patient's family history includes Arrhythmia in her daughter; Cancer in her brother; Heart attack in her father; Hypertension in her father; Stroke in her mother.  ROS:   Please see the history of present illness.     All other systems reviewed and are negative.   Prior CV studies:   The following studies were reviewed today:   06/14/2022: TTE  1. Left ventricular ejection fraction, by estimation, is 55 to 60%. The  left ventricle has normal function. The left ventricle has no regional  wall motion abnormalities. There is mild left ventricular hypertrophy.  Left ventricular diastolic parameters  are indeterminate. There is the interventricular septum is flattened in  diastole ('D' shaped left ventricle), consistent with right ventricular  volume overload.   2. Right ventricular systolic function is moderately reduced. The right  ventricular size is moderately enlarged. There is moderately elevated  pulmonary artery systolic pressure.    3. Left atrial size was severely dilated.   4. Right atrial size was severely dilated.   5. Mild to moderate mitral valve regurgitation.   6. Tricuspid valve regurgitation is mild to moderate.   7. Likely did not capture highest mean gradient. The aortic valve is  calcified. Aortic valve regurgitation is mild. Mild to moderate aortic  valve stenosis.   8. The inferior vena cava is dilated in size with <50% respiratory  variability, suggesting right atrial pressure of 15 mmHg.   Conclusion(s)/Recommendation(s): Compared to prior echo, there is more  pronounced septal flattening would r/o pulmonary embolism. Otherwise EF  has improved.   Labs/Other Tests and Data Reviewed:    EKG:   07/09/2022-atrial fibrillation 101  Recent Labs: 07/07/2022: ALT 5; BUN 21; Creatinine, Ser 1.10; Hemoglobin 8.7; Platelets  235; Potassium 4.9; Sodium 139   Recent Lipid Panel No results found for: "CHOL", "TRIG", "HDL", "CHOLHDL", "LDLCALC", "LDLDIRECT"  Wt Readings from Last 3 Encounters:  07/23/22 98 lb (44.5 kg)  07/07/22 105 lb (47.6 kg)  06/14/22 105 lb 4.8 oz (47.8 kg)     Risk Assessment/Calculations:         Objective:    Vital Signs:  BP 104/66   Pulse 84   Ht 5\' 5"  (1.651 m)   SpO2 98%   BMI 16.31 kg/m    Alert and oriented sitting upright in chair answers all questions appropriately.  Normal respiratory effort  ASSESSMENT & PLAN:    Chronic systolic heart failure - LV recovered.  Excellent.  Continuing with carvedilol digoxin and furosemide.  Dig level was normal in the hospital setting.  Continue with current Lasix dose as well as carvedilol.  We will send new prescription for 90 days for ease of use to pharmacy.  Orthostatic hypotension - Stable.  She is feeling well currently.  No changes made.  Permanent atrial fibrillation - CHA2DS2-VASc 6, warfarin, Dr. Arelia Sneddon office.  Doing well.  No bleeding.  Overall reasonable rate control.  They would rather continue with  carvedilol.  This is fine.  Permanent pacemaker Biotronik - Notes reviewed from [place].  Intact function.            Time:   Today, I have spent 21 minutes with the patient with telehealth technology discussing the above problems.     Medication Adjustments/Labs and Tests Ordered: Current medicines are reviewed at length with the patient today.  Concerns regarding medicines are outlined above.   Tests Ordered: No orders of the defined types were placed in this encounter.   Medication Changes: Meds ordered this encounter  Medications   carvedilol (COREG) 3.125 MG tablet    Sig: TAKE 1 TABLET BY MOUTH TWICE A DAY WITH A MEAL    Dispense:  180 tablet    Refill:  3    Follow Up:  In Person in 1 year(s)  Signed, Candee Furbish, MD  09/22/2022 2:55 PM    [place]

## 2022-10-16 ENCOUNTER — Ambulatory Visit (INDEPENDENT_AMBULATORY_CARE_PROVIDER_SITE_OTHER): Payer: Medicare PPO

## 2022-10-16 DIAGNOSIS — I255 Ischemic cardiomyopathy: Secondary | ICD-10-CM | POA: Diagnosis not present

## 2022-10-18 LAB — CUP PACEART REMOTE DEVICE CHECK
Battery Remaining Percentage: 60 %
Brady Statistic RV Percent Paced: 0 %
Date Time Interrogation Session: 20231130074746
Implantable Lead Connection Status: 753985
Implantable Lead Connection Status: 753985
Implantable Lead Implant Date: 20170719
Implantable Lead Implant Date: 20170719
Implantable Lead Location: 753859
Implantable Lead Location: 753860
Implantable Lead Model: 377
Implantable Lead Model: 377
Implantable Lead Serial Number: 49475245
Implantable Lead Serial Number: 49500032
Implantable Pulse Generator Implant Date: 20170719
Lead Channel Impedance Value: 507 Ohm
Lead Channel Pacing Threshold Amplitude: 0.7 V
Lead Channel Pacing Threshold Pulse Width: 0.4 ms
Lead Channel Sensing Intrinsic Amplitude: 8.5 mV
Lead Channel Setting Pacing Amplitude: 2.4 V
Lead Channel Setting Pacing Pulse Width: 0.4 ms
Pulse Gen Model: 407145
Pulse Gen Serial Number: 68814684

## 2022-11-05 NOTE — Progress Notes (Signed)
Remote pacemaker transmission.   

## 2023-01-15 ENCOUNTER — Ambulatory Visit (INDEPENDENT_AMBULATORY_CARE_PROVIDER_SITE_OTHER): Payer: Medicare PPO

## 2023-01-15 DIAGNOSIS — I255 Ischemic cardiomyopathy: Secondary | ICD-10-CM

## 2023-01-15 LAB — CUP PACEART REMOTE DEVICE CHECK
Date Time Interrogation Session: 20240229082351
Implantable Lead Connection Status: 753985
Implantable Lead Connection Status: 753985
Implantable Lead Implant Date: 20170719
Implantable Lead Implant Date: 20170719
Implantable Lead Location: 753859
Implantable Lead Location: 753860
Implantable Lead Model: 377
Implantable Lead Model: 377
Implantable Lead Serial Number: 49475245
Implantable Lead Serial Number: 49500032
Implantable Pulse Generator Implant Date: 20170719
Pulse Gen Model: 407145
Pulse Gen Serial Number: 68814684

## 2023-02-13 NOTE — Progress Notes (Signed)
Remote pacemaker transmission.   

## 2023-04-16 ENCOUNTER — Ambulatory Visit (INDEPENDENT_AMBULATORY_CARE_PROVIDER_SITE_OTHER): Payer: Medicare PPO

## 2023-04-16 DIAGNOSIS — I255 Ischemic cardiomyopathy: Secondary | ICD-10-CM

## 2023-04-17 LAB — CUP PACEART REMOTE DEVICE CHECK
Battery Remaining Percentage: 55 %
Brady Statistic RV Percent Paced: 0 %
Date Time Interrogation Session: 20240530075440
Implantable Lead Connection Status: 753985
Implantable Lead Connection Status: 753985
Implantable Lead Implant Date: 20170719
Implantable Lead Implant Date: 20170719
Implantable Lead Location: 753859
Implantable Lead Location: 753860
Implantable Lead Model: 377
Implantable Lead Model: 377
Implantable Lead Serial Number: 49475245
Implantable Lead Serial Number: 49500032
Implantable Pulse Generator Implant Date: 20170719
Lead Channel Impedance Value: 527 Ohm
Lead Channel Pacing Threshold Amplitude: 0.7 V
Lead Channel Pacing Threshold Pulse Width: 0.4 ms
Lead Channel Sensing Intrinsic Amplitude: 8.7 mV
Lead Channel Setting Pacing Amplitude: 2.4 V
Lead Channel Setting Pacing Pulse Width: 0.4 ms
Pulse Gen Model: 407145
Pulse Gen Serial Number: 68814684

## 2023-05-06 NOTE — Progress Notes (Signed)
Remote pacemaker transmission.   

## 2023-06-08 ENCOUNTER — Other Ambulatory Visit: Payer: Self-pay | Admitting: Cardiology

## 2023-07-16 ENCOUNTER — Ambulatory Visit (INDEPENDENT_AMBULATORY_CARE_PROVIDER_SITE_OTHER): Payer: Medicare PPO

## 2023-07-16 DIAGNOSIS — I255 Ischemic cardiomyopathy: Secondary | ICD-10-CM | POA: Diagnosis not present

## 2023-07-16 LAB — CUP PACEART REMOTE DEVICE CHECK
Date Time Interrogation Session: 20240829130728
Implantable Lead Connection Status: 753985
Implantable Lead Connection Status: 753985
Implantable Lead Implant Date: 20170719
Implantable Lead Implant Date: 20170719
Implantable Lead Location: 753859
Implantable Lead Location: 753860
Implantable Lead Model: 377
Implantable Lead Model: 377
Implantable Lead Serial Number: 49475245
Implantable Lead Serial Number: 49500032
Implantable Pulse Generator Implant Date: 20170719
Pulse Gen Model: 407145
Pulse Gen Serial Number: 68814684

## 2023-07-22 ENCOUNTER — Ambulatory Visit: Payer: Medicare PPO | Admitting: Student

## 2023-07-24 NOTE — Progress Notes (Signed)
Remote pacemaker transmission.   

## 2023-07-26 ENCOUNTER — Other Ambulatory Visit: Payer: Self-pay | Admitting: Cardiology

## 2023-08-21 ENCOUNTER — Ambulatory Visit: Payer: Medicare PPO | Admitting: Student

## 2023-09-02 ENCOUNTER — Other Ambulatory Visit: Payer: Self-pay | Admitting: Cardiology

## 2023-09-23 ENCOUNTER — Ambulatory Visit: Payer: Medicare PPO | Admitting: Student

## 2023-09-27 NOTE — Progress Notes (Deleted)
Cardiology Office Note Date:  09/27/2023  Patient ID:  Snyder, Carol April 18, 1929, MRN [id number] PCP:  Kaleen Mask, MD  Cardiologist:  Dr. Anne Fu Electrophysiologist: Allred >>> ***     Chief Complaint:  over due device visit, post hospital   History of Present Illness: Carol Snyder is a 89 y.o. female with history of permanent AFib,  SSSx w/PPM, stroke, chronic CHF (systolic)  He comes in today to be seen for Dr. Johney Frame, last seen by him 2019, discussed on warfarin, declined OAC. No changes were made, planned for annual EP APP visits.  Note during a hospital stay her CLS pacing was programmed off 2/2 pacing in the 120's >> VVI 50  She has been followed by Dr. Anne Fu and his team since then.  Most recently by Demetra Shiner, PA 02/06/21, doing well, no changes were made.  I saw her 02/21/21 She is accompanied by her daughter She reports no concerns, denies any CP, palpitations or cardiac awareness No dizzy spells, near syncope or syncope. She has poor appetite and losing weight Daughter mentions other GI issues, diarrhea. Following with IM Follows warfarin with her PMD Labs done there regularly No bleeding or signs of bleeding Discussed getting dig level, they preferred her labs be done via PMD and planned for it to be done there Did not want to switch to [facility]  ER visit 04/11/22: just discharged from [facility] hospital with pleural effusion that was drained, went to the ER with postural dizziness, via EMS, with them apparently had rapid Afib with position changes and syncope.  She was gove IVF, labs  Device interrogated without arrhythmias noted, suspect over diuresed and her lasix reduced  Admitted 06/13/22 - 06/17/22 with syncope and profound orthostatic hypotension, also noted to have hypoxia with ambulation, home meds held initially , in AFib w/RVR that improved,  CTA to evaluate given echocardiogram findings for pulmonary hypertension versus obstruction and elevated D-dimer  however CTA was negative for filling defects, tiny pleural effusion noted. Eventually home meds added back on, looks like her metoprolol changed to coreg and was able to ambulate and be discharged  I saw her 07/23/22 She is accompanied by her daughter/son in law. She is in a wheelchair today They report that it has been a slow recovery since home. She has been generally pretty weak,  The pt states that the day she fainted she was in the kitchen and just fell backwards out of nowhere, did not have warning. She is on lasix 80mg , this is a decrease from 120 apparently Her daughter states that when she is on any lower dose she gets so swollen that her legs will weep. No bleeding or signs of bleeding Her O2 sat on RA here [place] ok, they have not been using O2, they have seen her PMD since discharge,  Will defer to PMD her O2 management, they report that he did not think she needed it  Discussed changing her coreg to toprol for HR, less BP lowering but had just been changed to coreg at the hospital and all the changes is getting a little frustrating for them She in the last 3 days has been better, up and ambulating well with PT, without symptoms.  No changes were made with plans to get her back on board with gen cards  She saw Dr. Jacklyn Shell via a video visit 09/22/22, doing very well, no changes were made  *** device only *** rate *** warfarin, bleeding *** orthostatic? ***  volume? *** symptoms *** WC is reading    Device information Biotronik dual chamber PPM, implanted 06/04/2016 by Dr. Rudolpho Sevin   Past Medical History:  Diagnosis Date   Anemia    Aortic insufficiency    Chest pain    Chronic diarrhea    DNR (do not resuscitate)    Dysrhythmia    afib   Falling    History of GI bleed    History of shingles    Long term current use of anticoagulant    Mitral regurgitation    Orthostatic hypotension    Persistent atrial fibrillation (HCC)    on Coumadin   Pleural effusion    Sinus  node dysfunction (HCC)    a. s/p Biotronik PPM.   Stroke [facility])    Tricuspid regurgitation     Past Surgical History:  Procedure Laterality Date   APPENDECTOMY     BIOPSY  08/13/2021   Procedure: BIOPSY;  Surgeon: Kathi Der, MD;  [facility];  Service: Gastroenterology;;   CHOLECYSTECTOMY     with partial colectomy do to gallbadder infection    ESOPHAGOGASTRODUODENOSCOPY (EGD) WITH PROPOFOL N/A 08/13/2021   Procedure: ESOPHAGOGASTRODUODENOSCOPY (EGD) WITH PROPOFOL;  Surgeon: Kathi Der, MD;  [facility];  Service: Gastroenterology;  Laterality: N/A;   IR THORACENTESIS ASP PLEURAL SPACE W/IMG GUIDE  06/22/2018   IR THORACENTESIS ASP PLEURAL SPACE W/IMG GUIDE  09/29/2018   IR THORACENTESIS ASP PLEURAL SPACE W/IMG GUIDE  07/05/2019   PACEMAKER IMPLANT  06/04/2016   BTK Edora 8 DR-T implanted by Dr Rudolpho Sevin at [facility] for sick sinus syndrome    Current Outpatient Medications  Medication Sig Dispense Refill   acetaminophen (TYLENOL) 500 MG tablet Take 1,000 mg by mouth in the morning and at bedtime.     alendronate (FOSAMAX) 70 MG tablet Take 70 mg by mouth once a week. Sundays     Camphor-Menthol-Methyl Sal (SALONPAS) 3.11-22-08 % PTCH Apply 1 patch topically daily as needed (pain).     carvedilol (COREG) 3.125 MG tablet TAKE 1 TABLET BY MOUTH TWICE A DAY WITH FOOD 180 tablet 0   cholestyramine (QUESTRAN) 4 g packet Take 2 g by mouth daily.     digoxin (LANOXIN) 0.125 MG tablet TAKE 1/2 TABLET BY MOUTH DAILY 45 tablet 0   fluticasone (FLONASE) 50 MCG/ACT nasal spray Place 1 spray into both nostrils daily as needed for allergies.     furosemide (LASIX) 80 MG tablet Take 1 tablet (80 mg total) by mouth daily. (Patient taking differently: Take 80 mg by mouth every other day. 40mg  every other day) 90 tablet 3   hydroxypropyl methylcellulose / hypromellose (ISOPTO TEARS / GONIOVISC) 2.5 % ophthalmic solution Place 1 drop into both eyes 3 (three) times  daily as needed for dry eyes.     loperamide (IMODIUM) 2 MG capsule Take 4 mg by mouth daily.     loratadine (CLARITIN) 10 MG tablet Take 10 mg by mouth daily.     Menthol, Topical Analgesic, (BIOFREEZE EX) Apply 1 application  topically daily as needed (pain).     nitroGLYCERIN (NITROSTAT) 0.4 MG SL tablet Place 1 tablet (0.4 mg total) under the tongue every 5 (five) minutes as needed for chest pain. 25 tablet 6   pantoprazole (PROTONIX) 40 MG tablet Take 1 tablet (40 mg total) by mouth 2 (two) times daily. 180 tablet 0   potassium chloride SA (KLOR-CON M20) 20 MEQ tablet TAKE 1 TABLET BY MOUTH EVERY DAY 90 tablet 0  sucralfate (CARAFATE) 1 GM/10ML suspension Take 10 mLs (1 g total) by mouth 2 (two) times daily. 600 mL 0   warfarin (COUMADIN) 5 MG tablet Take 2.5-5 mg by mouth daily.     No current facility-administered medications for this visit.    Allergies:   Codeine, Enbrel [etanercept], Hydromorphone, Motrin [ibuprofen], Naproxen sodium, Sulfa antibiotics, and Voltaren [diclofenac]   Social History:  The patient  reports that she quit smoking about 18 years ago. Her smoking use included cigarettes. She has never used smokeless tobacco. She reports that she does not drink alcohol and does not use drugs.   Family History:  The patient's family history includes Arrhythmia in her daughter; Cancer in her brother; Heart attack in her father; Hypertension in her father; Stroke in her mother.  ROS:  Please see the history of present illness.    All other systems are reviewed and otherwise negative.   PHYSICAL EXAM:  VS:  There were no vitals taken for this visit. BMI: There is no height or weight on file to calculate BMI. Well nourished, well developed, in no acute distress HEENT: normocephalic, atraumatic Neck: no JVD, carotid bruits or masses Cardiac:  RRR; no significant murmurs, no rubs, or gallops Lungs:  CTA b/l, no wheezing, rhonchi or rales Abd: soft, nontender MS: no deformity,  advaned atrophy Ext: no edema Skin: warm and dry, no rash Neuro:  No gross deficits appreciated Psych: euthymic mood, full affect  PPM site is stable, no tethering or discomfort, skin is intact, no thinning   EKG:  Not done today  Device interrogation done today and reviewed by myself:  Battery and lead measurements are good VP only 4% HR histograms look pretty good, the vast majority of rates 60-100's, some 120-130's (this includes the last 517 days as well...   06/14/2022: TTE  1. Left ventricular ejection fraction, by estimation, is 55 to 60%. The  left ventricle has normal function. The left ventricle has no regional  wall motion abnormalities. There is mild left ventricular hypertrophy.  Left ventricular diastolic parameters  are indeterminate. There is the interventricular septum is flattened in  diastole ('D' shaped left ventricle), consistent with right ventricular  volume overload.   2. Right ventricular systolic function is moderately reduced. The right  ventricular size is moderately enlarged. There is moderately elevated  pulmonary artery systolic pressure.   3. Left atrial size was severely dilated.   4. Right atrial size was severely dilated.   5. Mild to moderate mitral valve regurgitation.   6. Tricuspid valve regurgitation is mild to moderate.   7. Likely did not capture highest mean gradient. The aortic valve is  calcified. Aortic valve regurgitation is mild. Mild to moderate aortic  valve stenosis.   8. The inferior vena cava is dilated in size with <50% respiratory  variability, suggesting right atrial pressure of 15 mmHg.   Conclusion(s)/Recommendation(s): Compared to prior echo, there is more  pronounced septal flattening would r/o pulmonary embolism. Otherwise EF  has improved.    stress 06/2019 There was no ST segment deviation noted during stress. T wave inversion was noted during stress in the II, III and aVF leads. Defect 1: There is a large  defect of moderate severity present in the basal inferoseptal, basal inferior, basal inferolateral, mid inferoseptal, mid inferior and mid inferolateral location. Findings consistent with subendocardial MI with peri-infarct ischemia of the inferior . inferioseptal and inferolateral walls. Nuclear stress EF: 33%. The left ventricular ejection fraction is moderately decreased (  30-44%). This is a high risk study.     Echo 06/2019  1. The left ventricle has severely reduced systolic function, with an  ejection fraction of 25-30%. The cavity size was normal. Left ventricular  diastolic function could not be evaluated secondary to atrial  fibrillation. Left ventricular diffuse  hypokinesis.   2. Severe akinesis of the left ventricular, entire inferior wall.   3. The right ventricle has normal systolic function. The cavity was  moderately enlarged. There is no increase in right ventricular wall  thickness.   4. Left atrial size was severely dilated.   5. Right atrial size was severely dilated.   6. Large pleural effusion in the right lateral region.   7. The mitral valve is abnormal. Moderate thickening of the mitral valve  leaflet. Mitral valve regurgitation is moderate to severe by color flow  Doppler.   8. The tricuspid valve is grossly normal. Tricuspid valve regurgitation  is mild-moderate.   9. The aortic valve is tricuspid. Mild calcification of the aortic valve.  Aortic valve regurgitation is mild by color flow Doppler. Mild-moderate  stenosis of the aortic valve.  10. The aorta is normal unless otherwise noted.  11. The inferior vena cava was dilated in size with <50% respiratory  variability.     Recent Labs: No results found for requested labs within last 365 days.  No results found for requested labs within last 365 days.   CrCl cannot be calculated (Patient's most recent lab result is older than the maximum 21 days allowed.).   Wt Readings from Last 3 Encounters:  07/23/22  98 lb (44.5 kg)  07/07/22 105 lb (47.6 kg)  06/14/22 105 lb 4.8 oz (47.8 kg)     Other studies reviewed: Additional studies/records reviewed today include: summarized above  ASSESSMENT AND PLAN:  1. PPM     Intact function     no programming changes made  She has had issues with CLA programming historically and will avoid this  2. Permanent AFib     CHA2DS2Vasc is 6, on warfarin, *** monitored and managed by her PMD     She prefers staying with warfarin, has been offered the switch previously   3. chronic CHF (systolic)     *** No signs of volume OL currently     LVEF with recovery by her echo in July but had a hospital stay in may requiring thora     ***      4. Orthostatic syncope     Discussed difficulty balancing volume management and tendency towards dehydration     ***     Disposition: ***  Current medicines are reviewed at length with the patient today.  The patient did not have any concerns regarding medicines.  Carol Fredrickson, PA-C 09/27/2023 9:31 AM     [facility] [address] [place] [address] [phone] (office)  [phone] (fax)

## 2023-09-28 ENCOUNTER — Ambulatory Visit: Payer: Medicare PPO | Admitting: Physician Assistant

## 2023-10-03 ENCOUNTER — Other Ambulatory Visit: Payer: Self-pay | Admitting: Cardiology

## 2023-10-16 ENCOUNTER — Ambulatory Visit (INDEPENDENT_AMBULATORY_CARE_PROVIDER_SITE_OTHER): Payer: Medicare PPO

## 2023-10-16 DIAGNOSIS — I255 Ischemic cardiomyopathy: Secondary | ICD-10-CM | POA: Diagnosis not present

## 2023-10-17 LAB — CUP PACEART REMOTE DEVICE CHECK
Battery Remaining Percentage: 55 %
Brady Statistic RV Percent Paced: 1 %
Date Time Interrogation Session: 20241129150801
Implantable Lead Connection Status: 753985
Implantable Lead Connection Status: 753985
Implantable Lead Implant Date: 20170719
Implantable Lead Implant Date: 20170719
Implantable Lead Location: 753859
Implantable Lead Location: 753860
Implantable Lead Model: 377
Implantable Lead Model: 377
Implantable Lead Serial Number: 49475245
Implantable Lead Serial Number: 49500032
Implantable Pulse Generator Implant Date: 20170719
Lead Channel Impedance Value: 546 Ohm
Lead Channel Pacing Threshold Amplitude: 0.9 V
Lead Channel Pacing Threshold Pulse Width: 0.4 ms
Lead Channel Sensing Intrinsic Amplitude: 9.2 mV
Lead Channel Setting Pacing Amplitude: 2.4 V
Lead Channel Setting Pacing Pulse Width: 0.4 ms
Pulse Gen Model: 407145
Pulse Gen Serial Number: 68814684

## 2023-10-20 ENCOUNTER — Encounter: Payer: Medicare PPO | Admitting: Pulmonary Disease

## 2023-11-17 NOTE — Progress Notes (Signed)
Remote pacemaker transmission.   

## 2023-11-18 NOTE — Progress Notes (Signed)
 Electrophysiology Office Note:   Date:  11/19/2023  ID:  Carol Snyder, DOB [birth year], MRN [id number]  Primary Cardiologist: Oneil Parchment, MD Primary Heart Failure: None Electrophysiologist: Will Gladis Norton, MD       History of Present Illness:   Carol Snyder is a 89 y.o. female with h/o permanent AF, SSS s/p PPM, chronic sCHF, CVA seen today for routine electrophysiology followup.   Since last being seen in our clinic the patient's daughter reports she is now enrolled in hospice.  Around Thanksgiving, her blood pressure was very low, she had no energy and was taken off all her medications.  She was enrolled in hospice after and since began eating again > has managed to gain 2 lbs.  She lives with her daughter. The patient restarted her blood pressure medications two days ago.  She has not been consistent taking her diuretics due to soft blood pressures.  The hospice RN noted crackles on exam.   She denies chest pain, palpitations, dyspnea, PND, orthopnea, nausea, vomiting, dizziness, syncope, edema, weight gain, or early satiety.   Review of systems complete and found to be negative unless listed in HPI.   EP Information / Studies Reviewed:    EKG is ordered today. Personal review as below.  EKG Interpretation Date/Time:  Thursday November 19 2023 14:55:40 EST Ventricular Rate:  140 PR Interval:    QRS Duration:  80 QT Interval:  310 QTC Calculation: 473 R Axis:   103  Text Interpretation: Atrial fibrillation with rapid ventricular response Confirmed by Aniceto Jarvis ([id number]) on 11/19/2023 4:18:28 PM   PPM Interrogation-  reviewed in detail today,  See PACEART report.  Device History: Biotronik Dual Chamber PPM implanted 06/04/2016 by Dr. Meldon for Sinus Node Dysfunction Intolerant of CLS in past   Studies:  Stress 06/2019 > findings c/w subendocardial MI with peri-infarct ischemia of the inferior, inferoseptal, inferolateral walls, stress EF 33% ECHO 05/2022 > LVEF 55-60%, no  RWMA, RV overload, LA severely dilated, RA severely dilated    Arrhythmia / AAD Permanent AF    Risk Assessment/Calculations:    CHA2DS2-VASc Score = 6   This indicates a 9.7% annual risk of stroke. The patient's score is based upon: CHF History: 0 HTN History: 0 Diabetes History: 0 Stroke History: 2 Vascular Disease History: 1 Age Score: 2 Gender Score: 1             Physical Exam:   VS:  BP (!) 98/58   Pulse 98   Ht 5' 5 (1.651 m)   Wt 89 lb 9.6 oz (40.6 kg)   SpO2 93%   BMI 14.91 kg/m    Wt Readings from Last 3 Encounters:  11/19/23 89 lb 9.6 oz (40.6 kg)  07/23/22 98 lb (44.5 kg)  07/07/22 105 lb (47.6 kg)     GEN: Well nourished, well developed in no acute distress NECK: No JVD; No carotid bruits CARDIAC: Irregularly irregular rate and rhythm, no murmurs, rubs, gallops RESPIRATORY:  normal effort, bibasilar L>R crackles ABDOMEN: Soft, non-tender, non-distended EXTREMITIES:  1+ BLE edema; No deformity   ASSESSMENT AND PLAN:    SND s/p Biotronik PPM  -Normal PPM function -See Pace Art report -No changes today  Permanent Atrial Fibrillation  CHA2DS2-VASc 6 -OAC for stroke prophylaxis  -continue coreg  > pt restarted ~ 2 days ago, discussed balancing her blood pressure / symptom control in clinic.  If SBP >95, continue giving Coreg .  If her SBP > 100, ok to give her  previously prescribed 20mg  lasix  as needed for swelling. Hopeful with restart of medication, we can achieve rate control and this will in turn prevent swelling.  She is asymptomatic currently.   -discussed addition of additional agents with family and they would like to defer anything new currently.    Secondary Hypercoagulable State  -continue Coumadin , follows with Dr. Loring  -previously offered DOAC and pt elects for coumadin    Chronic Systolic CHF  -euvolemic on exam  -coreg , lasix  as above    Disposition:   Follow up with Dr. Inocencio in 12 months or sooner if new needs arise.    Signed, Daphne Barrack, MSN, APRN, NP-C, AGACNP-BC [facility] - Electrophysiology  11/19/2023, 4:18 PM

## 2023-11-19 ENCOUNTER — Ambulatory Visit: Payer: Medicare PPO | Attending: Pulmonary Disease | Admitting: Pulmonary Disease

## 2023-11-19 ENCOUNTER — Encounter: Payer: Self-pay | Admitting: Pulmonary Disease

## 2023-11-19 VITALS — BP 98/58 | HR 98 | Ht 65.0 in | Wt 89.6 lb

## 2023-11-19 DIAGNOSIS — Z950 Presence of cardiac pacemaker: Secondary | ICD-10-CM

## 2023-11-19 DIAGNOSIS — I5022 Chronic systolic (congestive) heart failure: Secondary | ICD-10-CM | POA: Diagnosis not present

## 2023-11-19 DIAGNOSIS — I4821 Permanent atrial fibrillation: Secondary | ICD-10-CM | POA: Diagnosis not present

## 2023-11-19 LAB — CUP PACEART INCLINIC DEVICE CHECK
Date Time Interrogation Session: 20250102162730
Implantable Lead Connection Status: 753985
Implantable Lead Connection Status: 753985
Implantable Lead Implant Date: 20170719
Implantable Lead Implant Date: 20170719
Implantable Lead Location: 753859
Implantable Lead Location: 753860
Implantable Lead Model: 377
Implantable Lead Model: 377
Implantable Lead Serial Number: 49475245
Implantable Lead Serial Number: 49500032
Implantable Pulse Generator Implant Date: 20170719
Pulse Gen Model: 407145
Pulse Gen Serial Number: 68814684

## 2023-11-19 NOTE — Patient Instructions (Signed)
 Medication Instructions:   Your physician recommends that you continue on your current medications as directed. Please refer to the Current Medication list given to you today.   *If you need a refill on your cardiac medications before your next appointment, please call your pharmacy*   Lab Work: NONE ORDERED  TODAY    If you have labs (blood work) drawn today and your tests are completely normal, you will receive your results only by: MyChart Message (if you have MyChart) OR A paper copy in the mail If you have any lab test that is abnormal or we need to change your treatment, we will call you to review the results.   Testing/Procedures: NONE ORDERED  TODAY      Follow-Up: At [facility], you and your health needs are our priority.  As part of our continuing mission to provide you with exceptional heart care, we have created designated Provider Care Teams.  These Care Teams include your primary Cardiologist (physician) and Advanced Practice Providers (APPs -  Physician Assistants and Nurse Practitioners) who all work together to provide you with the care you need, when you need it.  We recommend signing up for the patient portal called MyChart.  Sign up information is provided on this After Visit Summary.  MyChart is used to connect with patients for Virtual Visits (Telemedicine).  Patients are able to view lab/test results, encounter notes, upcoming appointments, etc.  Non-urgent messages can be sent to your provider as well.   To learn more about what you can do with MyChart, go to [email or web address].    Your next appointment:    1 year(s)  Provider:    You may see one of the following Advanced Practice Providers on your designated Care Team:   Charlies Arthur, PA-C Michael Andy Tillery, PA-C Daphne Barrack, NP   Other Instructions  If SYSTOLIC BP( TOP NUMBER) GREATER THAN 95 OKAY TO GIVE COREG  DOSE    SYSTOLIC BP( TOP NUMBER) GREATER THAN 100  OKAY TO  GIVE AS NEEDED LASIX  DOSE ( OKAY TO TAKE HALF)

## 2023-12-01 ENCOUNTER — Other Ambulatory Visit: Payer: Self-pay | Admitting: Cardiology

## 2023-12-19 DEATH — deceased
